# Patient Record
Sex: Female | Born: 1940
Health system: Southern US, Community
[De-identification: ages and names within clinical notes are randomized; demographics above are authoritative.]

## PROBLEM LIST (undated history)

## (undated) DIAGNOSIS — F329 Major depressive disorder, single episode, unspecified: Secondary | ICD-10-CM

## (undated) DIAGNOSIS — M25511 Pain in right shoulder: Secondary | ICD-10-CM

## (undated) DIAGNOSIS — Z9889 Other specified postprocedural states: Secondary | ICD-10-CM

## (undated) DIAGNOSIS — T8859XA Other complications of anesthesia, initial encounter: Secondary | ICD-10-CM

## (undated) DIAGNOSIS — K219 Gastro-esophageal reflux disease without esophagitis: Secondary | ICD-10-CM

## (undated) DIAGNOSIS — F32A Depression, unspecified: Secondary | ICD-10-CM

## (undated) DIAGNOSIS — I499 Cardiac arrhythmia, unspecified: Secondary | ICD-10-CM

## (undated) DIAGNOSIS — I2510 Atherosclerotic heart disease of native coronary artery without angina pectoris: Secondary | ICD-10-CM

## (undated) DIAGNOSIS — C801 Malignant (primary) neoplasm, unspecified: Secondary | ICD-10-CM

## (undated) DIAGNOSIS — I219 Acute myocardial infarction, unspecified: Secondary | ICD-10-CM

## (undated) DIAGNOSIS — R0609 Other forms of dyspnea: Secondary | ICD-10-CM

## (undated) DIAGNOSIS — I480 Paroxysmal atrial fibrillation: Secondary | ICD-10-CM

## (undated) DIAGNOSIS — M1990 Unspecified osteoarthritis, unspecified site: Secondary | ICD-10-CM

## (undated) DIAGNOSIS — T4145XA Adverse effect of unspecified anesthetic, initial encounter: Secondary | ICD-10-CM

## (undated) DIAGNOSIS — R112 Nausea with vomiting, unspecified: Secondary | ICD-10-CM

## (undated) DIAGNOSIS — I509 Heart failure, unspecified: Secondary | ICD-10-CM

## (undated) DIAGNOSIS — L03115 Cellulitis of right lower limb: Secondary | ICD-10-CM

## (undated) DIAGNOSIS — C569 Malignant neoplasm of unspecified ovary: Secondary | ICD-10-CM

## (undated) DIAGNOSIS — I639 Cerebral infarction, unspecified: Secondary | ICD-10-CM

## (undated) DIAGNOSIS — Z955 Presence of coronary angioplasty implant and graft: Secondary | ICD-10-CM

## (undated) DIAGNOSIS — K635 Polyp of colon: Secondary | ICD-10-CM

## (undated) DIAGNOSIS — N750 Cyst of Bartholin's gland: Secondary | ICD-10-CM

## (undated) DIAGNOSIS — R0600 Dyspnea, unspecified: Secondary | ICD-10-CM

## (undated) DIAGNOSIS — I10 Essential (primary) hypertension: Secondary | ICD-10-CM

## (undated) DIAGNOSIS — N8110 Cystocele, unspecified: Secondary | ICD-10-CM

## (undated) DIAGNOSIS — S32409A Unspecified fracture of unspecified acetabulum, initial encounter for closed fracture: Secondary | ICD-10-CM

## (undated) DIAGNOSIS — E119 Type 2 diabetes mellitus without complications: Secondary | ICD-10-CM

## (undated) DIAGNOSIS — F419 Anxiety disorder, unspecified: Secondary | ICD-10-CM

## (undated) DIAGNOSIS — E785 Hyperlipidemia, unspecified: Secondary | ICD-10-CM

## (undated) HISTORY — PX: OTHER SURGICAL HISTORY: SHX169

## (undated) HISTORY — PX: COLONOSCOPY: SHX174

## (undated) HISTORY — DX: Anxiety disorder, unspecified: F41.9

## (undated) HISTORY — DX: Cyst of Bartholin's gland: N75.0

## (undated) HISTORY — PX: FACIAL COSMETIC SURGERY: SHX629

## (undated) HISTORY — PX: SALIVARY GLAND SURGERY: SHX768

## (undated) HISTORY — DX: Hyperlipidemia, unspecified: E78.5

## (undated) HISTORY — DX: Other forms of dyspnea: R06.09

## (undated) HISTORY — DX: Unspecified fracture of unspecified acetabulum, initial encounter for closed fracture: S32.409A

## (undated) HISTORY — DX: Dyspnea, unspecified: R06.00

## (undated) HISTORY — DX: Major depressive disorder, single episode, unspecified: F32.9

## (undated) HISTORY — DX: Pain in right shoulder: M25.511

## (undated) HISTORY — DX: Malignant neoplasm of unspecified ovary: C56.9

## (undated) HISTORY — PX: CHOLECYSTECTOMY: SHX55

## (undated) HISTORY — PX: ABDOMINAL HYSTERECTOMY: SHX81

## (undated) HISTORY — PX: ROUX-EN-Y PROCEDURE: SUR1287

## (undated) HISTORY — DX: Cystocele, unspecified: N81.10

## (undated) HISTORY — DX: Depression, unspecified: F32.A

## (undated) HISTORY — DX: Gastro-esophageal reflux disease without esophagitis: K21.9

## (undated) HISTORY — PX: FRACTURE SURGERY: SHX138

## (undated) HISTORY — PX: KNEE SURGERY: SHX244

## (undated) HISTORY — DX: Essential (primary) hypertension: I10

## (undated) HISTORY — DX: Atherosclerotic heart disease of native coronary artery without angina pectoris: I25.10

## (undated) HISTORY — DX: Presence of coronary angioplasty implant and graft: Z95.5

## (undated) HISTORY — DX: Cellulitis of right lower limb: L03.115

## (undated) HISTORY — DX: Morbid (severe) obesity due to excess calories: E66.01

## (undated) HISTORY — DX: Type 2 diabetes mellitus without complications: E11.9

## (undated) HISTORY — PX: JOINT REPLACEMENT: SHX530

## (undated) HISTORY — DX: Paroxysmal atrial fibrillation: I48.0

---

## 1984-06-22 HISTORY — PX: OTHER SURGICAL HISTORY: SHX169

## 1987-06-23 HISTORY — PX: BREAST BIOPSY: SHX20

## 2000-04-29 ENCOUNTER — Encounter: Payer: Self-pay | Admitting: Family Medicine

## 2000-04-29 ENCOUNTER — Encounter: Admission: RE | Admit: 2000-04-29 | Discharge: 2000-04-29 | Payer: Self-pay | Admitting: Family Medicine

## 2000-06-01 ENCOUNTER — Ambulatory Visit (HOSPITAL_BASED_OUTPATIENT_CLINIC_OR_DEPARTMENT_OTHER): Admission: RE | Admit: 2000-06-01 | Discharge: 2000-06-01 | Payer: Self-pay | Admitting: Orthopaedic Surgery

## 2001-06-16 ENCOUNTER — Encounter: Payer: Self-pay | Admitting: Family Medicine

## 2001-06-16 ENCOUNTER — Encounter: Admission: RE | Admit: 2001-06-16 | Discharge: 2001-06-16 | Payer: Self-pay | Admitting: Family Medicine

## 2001-06-24 ENCOUNTER — Encounter: Admission: RE | Admit: 2001-06-24 | Discharge: 2001-06-24 | Payer: Self-pay | Admitting: Family Medicine

## 2001-06-24 ENCOUNTER — Encounter: Payer: Self-pay | Admitting: Family Medicine

## 2002-06-22 HISTORY — PX: TOTAL HIP ARTHROPLASTY: SHX124

## 2002-06-22 HISTORY — PX: OTHER SURGICAL HISTORY: SHX169

## 2002-06-30 ENCOUNTER — Encounter: Payer: Self-pay | Admitting: Emergency Medicine

## 2002-06-30 ENCOUNTER — Emergency Department (HOSPITAL_COMMUNITY): Admission: EM | Admit: 2002-06-30 | Discharge: 2002-06-30 | Payer: Self-pay

## 2002-07-14 ENCOUNTER — Inpatient Hospital Stay (HOSPITAL_COMMUNITY)
Admission: RE | Admit: 2002-07-14 | Discharge: 2002-07-24 | Payer: Self-pay | Admitting: Physical Medicine & Rehabilitation

## 2003-06-23 DIAGNOSIS — L03115 Cellulitis of right lower limb: Secondary | ICD-10-CM

## 2003-06-23 HISTORY — DX: Cellulitis of right lower limb: L03.115

## 2003-10-15 ENCOUNTER — Encounter: Admission: RE | Admit: 2003-10-15 | Discharge: 2003-10-15 | Payer: Self-pay | Admitting: Family Medicine

## 2003-11-21 ENCOUNTER — Encounter (INDEPENDENT_AMBULATORY_CARE_PROVIDER_SITE_OTHER): Payer: Self-pay | Admitting: Specialist

## 2003-11-21 ENCOUNTER — Ambulatory Visit (HOSPITAL_COMMUNITY): Admission: RE | Admit: 2003-11-21 | Discharge: 2003-11-21 | Payer: Self-pay | Admitting: *Deleted

## 2004-04-21 ENCOUNTER — Encounter (INDEPENDENT_AMBULATORY_CARE_PROVIDER_SITE_OTHER): Payer: Self-pay | Admitting: *Deleted

## 2004-04-21 ENCOUNTER — Ambulatory Visit (HOSPITAL_COMMUNITY): Admission: RE | Admit: 2004-04-21 | Discharge: 2004-04-21 | Payer: Self-pay | Admitting: Oral Surgery

## 2005-01-15 ENCOUNTER — Encounter (INDEPENDENT_AMBULATORY_CARE_PROVIDER_SITE_OTHER): Payer: Self-pay | Admitting: *Deleted

## 2005-01-15 ENCOUNTER — Ambulatory Visit (HOSPITAL_COMMUNITY): Admission: RE | Admit: 2005-01-15 | Discharge: 2005-01-15 | Payer: Self-pay | Admitting: *Deleted

## 2005-04-15 ENCOUNTER — Encounter: Admission: RE | Admit: 2005-04-15 | Discharge: 2005-04-15 | Payer: Self-pay | Admitting: Family Medicine

## 2006-06-22 HISTORY — PX: GASTRIC BYPASS: SHX52

## 2006-11-10 ENCOUNTER — Encounter: Admission: RE | Admit: 2006-11-10 | Discharge: 2006-11-10 | Payer: Self-pay | Admitting: Family Medicine

## 2008-03-16 ENCOUNTER — Encounter: Payer: Self-pay | Admitting: Obstetrics & Gynecology

## 2008-03-16 ENCOUNTER — Ambulatory Visit (HOSPITAL_COMMUNITY): Admission: RE | Admit: 2008-03-16 | Discharge: 2008-03-16 | Payer: Self-pay | Admitting: Obstetrics & Gynecology

## 2008-05-07 ENCOUNTER — Encounter: Admission: RE | Admit: 2008-05-07 | Discharge: 2008-05-07 | Payer: Self-pay | Admitting: Obstetrics & Gynecology

## 2008-05-29 ENCOUNTER — Encounter: Admission: RE | Admit: 2008-05-29 | Discharge: 2008-05-29 | Payer: Self-pay | Admitting: Obstetrics & Gynecology

## 2009-05-10 ENCOUNTER — Encounter: Admission: RE | Admit: 2009-05-10 | Discharge: 2009-05-10 | Payer: Self-pay | Admitting: Family Medicine

## 2010-07-12 ENCOUNTER — Encounter: Payer: Self-pay | Admitting: Cardiology

## 2010-08-11 ENCOUNTER — Other Ambulatory Visit: Payer: Self-pay | Admitting: Family Medicine

## 2010-08-11 DIAGNOSIS — Z1231 Encounter for screening mammogram for malignant neoplasm of breast: Secondary | ICD-10-CM

## 2010-09-01 ENCOUNTER — Ambulatory Visit
Admission: RE | Admit: 2010-09-01 | Discharge: 2010-09-01 | Disposition: A | Discharge status: Home / Self Care | Payer: Medicare Other | Source: Ambulatory Visit | Attending: *Deleted | Admitting: *Deleted

## 2010-09-01 DIAGNOSIS — Z1231 Encounter for screening mammogram for malignant neoplasm of breast: Secondary | ICD-10-CM

## 2010-11-04 NOTE — Op Note (Signed)
NAMEJAYDALYN, Margaret [name]                ACCOUNT NO.:  [id number]   MEDICAL RECORD NO.:  [id number]          PATIENT TYPE:  AMB   LOCATION:  SDC                           FACILITY:  [facility]   PHYSICIAN:  M. Leda Quail, MD  DATE OF BIRTH:  02-12-1941   DATE OF PROCEDURE:  03/16/2008  DATE OF DISCHARGE:                               OPERATIVE REPORT   PREOPERATIVE DIAGNOSES:  66. A 70 year old, gravida 1, para 1, married white female with vaginal      polyp.  2. Hypertension.  3. Gastroesophageal reflux disease.  4. History of obesity, status post laparoscopic Roux-en-Y in December      2008.   POSTOPERATIVE DIAGNOSES:  100. A 70 year old, gravida 1, para 1, married white female with vaginal      polyp.  2. Hypertension.  3. Gastroesophageal reflux disease.  4. History of obesity, status post laparoscopic Roux-en-Y in December      2008.   PROCEDURE:  Vaginal polyp excision and Pap smear.   SURGEON:  M. Leda Quail, MD   ASSISTANT:  OR staff.   ANESTHESIA:  General with an LMA.   FINDINGS:  A 3- to 4-cm firm vaginal polyp most consistent with a  fibroepithelial polyp.   SPECIMENS:  Polyp and Pap smear sent to pathology.   ESTIMATED BLOOD LOSS:  Minimal.   FLUIDS:  600 mL of LR.   URINE OUTPUT:  50 mL of clear urine.   COMPLICATIONS:  None.   INDICATIONS:  Margaret [name] is a 70 year old, G1, P71, married white female  who has a history of firm large vaginal polyp as noted on exam by Dr.  Benedetto Goad, her primary care doctor.  She was referred to me for  further management.  We discussed the possible etiologies of this polyp,  and most likely, this was a benign condition.  We discussed whether we  should leave it in place or have it excised.  She does note its presence  which is sometimes bothersome to her, and she decided to go ahead and  have it removed.  Risks and benefits were discussed with the patient in  the office.  Because of the size of it, I just felt likely should  do  this in the operating room to have cautery available and the ability to  do suturing if there was a lot of vascularity to this.  The patient was  in agreement.  She presents for this today.   OPERATIVE NOTE:  The patient was taken to the operating room.  She was  placed in supine position.  Anesthesia was administered by the  Anesthesia without difficulty.  Legs positioned in [place] stirrups in the  low lithotomy position and then lifted to the high lithotomy position.  Initially, a Pap smear was performed.  Then, using sterile technique,  the patient's perineum, inner thighs, and vagina were prepped and draped  in the normal sterile fashion.  A red rubber Foley catheter was used to  drain the bladder of all urine.  A bivalve speculum was placed in the  vagina.  This polyp was seen, but it was difficult to visualize with the  speculum, so the speculum was removed, and a heavyweight speculum was  placed in the posterior vaginal opening.  The patient was placed in a  little bit of Trendelenburg to help the heavyweight speculum place.  The  polyp was grasped with a pickup with teeth.  A 0 Vicryl suture was  placed at the base of the stock of the polyp.  Then, using a curved  Mayo, this polyp was excised.  A 1% lidocaine mixed 1:1 with epinephrine  (1:100,000 units) was instilled at the base.  Cautery was used to  cauterize across the base.  Then, a figure-of-eight suture of 2-0 Vicryl  was used to ensure hemostasis.  At this point, there was no bleeding  noted.  The procedure was ended, all instruments removed from the  vagina.   Sponge, lap, needle, and instrument counts were correct x2.  The  Betadine was cleansed off the patient's skin.  Her legs were positioned  back in supine position.  She was taken to the recovery room in stable  condition.      Lum Keas, MD  Electronically Signed     MSM/MEDQ  D:  03/16/2008  T:  03/16/2008  Job:  [id number]

## 2010-11-07 NOTE — Op Note (Signed)
[place]  Patient:    Margaret [name], Margaret [name]                         MRN: [id number] Proc. Date: 06/01/00 Adm. Date:  [id number] Attending:  Marcene Corning                           Operative Report  PREOPERATIVE DIAGNOSES: 1. Right knee chondromalacia of the patella. 2. Right knee torn lateral meniscus.  POSTOPERATIVE DIAGNOSES: 1. Right knee chondromalacia of the patella. 2. Right knee torn lateral meniscus. 3. Right knee loose body.  OPERATION: 1. Right knee partial lateral meniscectomy. 2. Right knee chondroplasty patella and lateral tibial plateau. 3. Right knee removal loose body.  SURGEON:  Lubertha Basque. Jerl Santos, M.D.  ASSISTANT:  Prince Rome, P.A.  ANESTHESIA:  General  INDICATION FOR PROCEDURE:  The patient is a 70 year old woman with about a year of right knee pain.  This has persisted with slight activity restriction and the use of several different anti-inflammatories.  At this point she is offered operative intervention to consist of an arthroscopy.  The procedure was discussed with the patient and informed operative consent was obtained after discussion of the possible complications and reaction to anesthesia and infection.  DESCRIPTION OF PROCEDURE:  The patient was taken to the operating suite where general anesthetic was applied without difficulty.  She was positioned supine and prepped and draped in a normal sterile fashion.  After the administration of preoperative IV antibiotics, an arthroscopy of the right knee was performed through a total of two portals.  Suprapatellar pouch was benign while the patellar femoral joint exhibited some grade III chondromalacia of both aspects.  This was addressed with a thorough chondroplasty.  The joint tracks fairly well and no lateral release was felt to be necessary.  The medial compartment was benign with no evidence of meniscal articular cartilage injury.  The ACL and PCL were  intact in the notch. In the lateral compartment she had a 1 cm loose body which was a large flap of articular cartilage.  This was removed.  This appeared to emanate from the tibial plateau and the lateral compartment.  A thorough chondroplasty was performed here, though she was left with dime sized area of grade IV change.  She also had a degenerative tear of the middle and posterior horns of the lateral meniscus.  This was addressed with a partial lateral meniscectomy taking about 5% of the structure back to the stable rim.  The knee joint was thoroughly irrigated at the end of the case followed by placement of Marcaine with epinephrine and morphine plus Depo-Medrol.  Adaptic was placed at portals followed by dry gauze and a loose Ace wrap.  Estimated blood loss and intraoperative fluids can be obtained from anesthesia records.  DISPOSITION:  The patient was extubated in the operating room and taken to the recovery room in stable condition.  Plans were for her to go home the same day and follow up in the office in less than a week. I will contact her by phone tonight. DD:  06/01/00 TD:  06/01/00 Job: [id number]

## 2010-11-07 NOTE — Op Note (Signed)
Margaret [name], Margaret [name]                ACCOUNT NO.:  [id number]   MEDICAL RECORD NO.:  [id number]          PATIENT TYPE:  AMB   LOCATION:  ENDO                         FACILITY:  [facility]   PHYSICIAN:  Georgiana Spinner, M.D.    DATE OF BIRTH:  1941/05/16   DATE OF PROCEDURE:  01/15/2005  DATE OF DISCHARGE:                                 OPERATIVE REPORT   PROCEDURE:  Colonoscopy with biopsy and polypectomy.   ENDOSCOPIST:  Georgiana Spinner, M.D.   INDICATIONS:  Colon polyp.   ANESTHESIA:  Demerol 70 mg, Versed 7.5 mg.   PROCEDURE IN DETAIL:  With the patient mildly sedated in the left lateral  decubitus position and subsequently rolled to her back with pressure applied  to the abdomen, the Olympus videoscopic colonoscope was inserted in the  rectum and passed under direct vision to the cecum identified by ileocecal  valve and appendiceal orifice both of which were photographed from this  point. The colonoscope was slowly withdrawn taking circumferential views of  the colonic mucosa stopping at 25 cm approximately from the anal verge at  which point a small sessile polyp was seen, photographed, and removed first  using snare cautery technique. There was a small amount of residual polyp  left after this, so I used a hot biopsy forceps to eradicate the remainder  of this to my satisfaction. I took photograph to document this and the  endoscope was then withdrawn to the rectum which appeared normal and  directly showed hemorrhoids on retroflexed view. The endoscope was  straightened and withdrawn. The patient's vital signs and pulse oximeter  remained stable. The patient tolerated procedure well without apparent  complication.   FINDINGS:  1.  Small polyp at 25 cm from the anal verge removed. 2. Scattered      diverticulosis throughout the colon, mild. 3. Internal hemorrhoids.   PLAN:  Await biopsy report. The patient will call me for results and follow-  up with me as an  outpatient       GMO/MEDQ  D:  01/15/2005  T:  01/15/2005  Job:  [id number]

## 2010-11-07 NOTE — Op Note (Signed)
NAME:  [name], Margaret [name]                          ACCOUNT NO.:  [id number]   MEDICAL RECORD NO.:  [id number]                   PATIENT TYPE:  AMB   LOCATION:  ENDO                                 [facility]ACILITY:  [facility]   PHYSICIAN:  Georgiana Spinner, M.D.                 DATE OF BIRTH:  Dec 16, 1940   DATE OF PROCEDURE:  11/21/2003  DATE OF DISCHARGE:                                 OPERATIVE REPORT   PROCEDURE:  Colon polyps.   ENDOSCOPIST:  Georgiana Spinner, M.D.   ANESTHESIA:  Demerol 90, Versed 8 mg, Phenergan 12.5 mg.   DESCRIPTION OF PROCEDURE:  With the patient mildly sedated in the left  lateral decubitus position, the Olympus videoscopic colonoscope was inserted  in the rectum and passed under direct vision to the cecum identified by  ileocecal valve and appendiceal orifice, both of which were photographed.  From this point the colonoscope was slowly withdrawn, taking circumferential  views of the colonic mucosa, stopping first at 40 cm from the anal verge at  which point a thickening of a fold was seen that appeared it may have been  adenomatous-type tissue, it was photographed and it was removed using hot  biopsy forceps technique a setting 20/25 current. We next stopped at 20 cm  from the anal verge at which point a larger polyp approximately 7 mm in  size, raised and round was noted, photographed and it was removed using  snare cautery technique setting of 20/25 current, and tissue was suctioned  into the scope and retrieved for pathology. The endoscope was then withdrawn  all the way to the rectum which appeared normal on direct view and on  retroflexed view of the rectum internal hemorrhoids were noted along with  another smaller polyp, flat, sessile just a few centimeters above the  dentate line. This was photographed as well and it too was removed using hot  biopsy forceps technique, again a setting of 20/25 current. The endoscope  was straightened and withdrawn. The patient's vital  signs and pulse oximeter  remained stable. The patient tolerated the procedure well with no apparent  complication.   FINDINGS:  Polyps as described above at 40, 20 and just a few centimeters  from the anal verge.   PLAN:  Await biopsy reports. The patient will call me for the results and  follow up with me as an outpatient.                                               Georgiana Spinner, M.D.    GMO/MEDQ  D:  11/21/2003  T:  11/21/2003  Job:  [id number]   cc:   Teena Irani. Arlyce Dice, M.D.  [address]  [place]  [place] [address]  Fax: [phone]
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
## 2010-11-07 NOTE — Op Note (Signed)
NAMEMARLEENA, [name] NO.:  [id number]   MEDICAL RECORD NO.:  [id number]          PATIENT TYPE:  OIB   LOCATION:  2899                         FACILITY:  [facility]   PHYSICIAN:  Dora Sims, M.D.  DATE OF BIRTH:  18-Oct-1940   DATE OF PROCEDURE:  04/21/2004  DATE OF DISCHARGE:  04/21/2004                                 OPERATIVE REPORT   PREOPERATIVE DIAGNOSIS:  Minor salivary gland mucoepidermoid carcinoma,  right-sided buccal mucosa.   POSTOPERATIVE DIAGNOSIS:  Minor salivary gland mucoepidermoid carcinoma,  right-sided buccal mucosa.   OPERATION PERFORMED:  Excision of biopsy margins not clear of carcinoma.   SURGEON:  Dora Sims, D.D.S., M.D.   ANESTHESIA:  Managed anesthesia care.   INDICATIONS FOR PROCEDURE:  The patient is a 70 year old Caucasian woman who  was initially referred to my office for evaluation of a right buccal mucosal  nodule.  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  The patient was then sent for a CT scan to rule out any  pathology of the parotid gland which was found to be negative on the right  side.  There was a small superficial lobe nodule that was found on the left  but not related to the biopsy of the right buccal mucosa.  It was discussed  with the patient that we needed to remove any remaining cancer cells of the  right buccal mucosa that were not excised at the time of biopsy.  Appropriate consents were obtained and the patient was scheduled for removal  of residual tumor.   DESCRIPTION OF PROCEDURE:  The patient was maintained n.p.o. the night  before surgery, brought to the operating suite, placed in supine position.  All anesthesia monitors were found to be working appropriately.  The patient  was administered managed anesthesia care.  She  was not intubated and was  kept comfortable during the case.  The patient was draped in a clean  fashion.  Approximately 4 mL of 2% lidocaine with 1:100,000 parts  epinephrine was injected into the right buccal mucosa in the area of the  previous biopsy.  Under loupe magnification, the previous surgical was  easily identified and clean margins of buccal mucosa were excised medially  approximately to the area of the attached gingiva and laterally  approximately 4 to 5 mm outside of the previous incision site.  A fusiform  incision was made around the previous incision.  Dissection was made sharply  down through buccal mucosa, through buccinator muscle and most inferiorly, a  plane was dissected above the periosteum.  Once the lesion was completely  excised, it was sent for frozen section from the operating room and I waited  hear the results which were confirmed as negative for any cancer cells per  frozen section.  Once this was determined, a minor rotation advancement flap  was performed in order to aid in the closure of the wound  of the buccal  defect.  This was closed in a tension free manner using 4-0 Vicryl sutures  in an interrupted and running fashion.  The patient tolerated the procedure  well.  Minimal blood was lost.  No blood was administered.  No drains were  placed.  The specimen that was removed from right buccal mucosa was sent for  frozen section and will be sent for definitive histopathologic evaluation to  rule out any margins that are not free of tumor.  At my request will also be  sent to Dr.  Vassie Loll at [facility] Oral Pathology Department as she was  the first pathologist to evaluate the case.  The patient will be maintained  on a soft chewing diet, some p.o. antibiotics and pain medicines will be  followed in my clinic until complete healing of the resection of her tumor.      [place]obe   RJR/MEDQ  D:  05/06/2004  T:  05/06/2004  Job:  [id number]

## 2011-03-23 LAB — BASIC METABOLIC PANEL
Calcium: 9.2
Chloride: 104
GFR calc Af Amer: >60
GFR calc non Af Amer: >60
Potassium: 2.9 — ABNORMAL LOW
Sodium: 141

## 2011-03-23 LAB — URINE MICROSCOPIC-ADD ON

## 2011-03-23 LAB — URINALYSIS, ROUTINE W REFLEX MICROSCOPIC
Glucose, UA: NEGATIVE
Specific Gravity, Urine: 1.025
Urobilinogen, UA: 1
pH: 5.5

## 2011-03-23 LAB — CBC
HCT: 39
MCHC: 33.5
Platelets: 209

## 2011-09-23 ENCOUNTER — Other Ambulatory Visit: Payer: Self-pay | Admitting: Family Medicine

## 2011-09-23 DIAGNOSIS — Z1231 Encounter for screening mammogram for malignant neoplasm of breast: Secondary | ICD-10-CM

## 2011-10-07 ENCOUNTER — Ambulatory Visit
Admission: RE | Admit: 2011-10-07 | Discharge: 2011-10-07 | Disposition: A | Discharge status: Home / Self Care | Payer: Medicare Other | Source: Ambulatory Visit | Attending: Family Medicine | Admitting: Family Medicine

## 2011-10-07 DIAGNOSIS — Z1231 Encounter for screening mammogram for malignant neoplasm of breast: Secondary | ICD-10-CM

## 2011-10-16 ENCOUNTER — Encounter: Payer: Self-pay | Admitting: *Deleted

## 2011-10-16 DIAGNOSIS — F419 Anxiety disorder, unspecified: Secondary | ICD-10-CM | POA: Insufficient documentation

## 2011-10-16 DIAGNOSIS — L03115 Cellulitis of right lower limb: Secondary | ICD-10-CM | POA: Insufficient documentation

## 2011-10-16 DIAGNOSIS — M25511 Pain in right shoulder: Secondary | ICD-10-CM | POA: Insufficient documentation

## 2011-10-16 DIAGNOSIS — F329 Major depressive disorder, single episode, unspecified: Secondary | ICD-10-CM | POA: Insufficient documentation

## 2011-10-16 DIAGNOSIS — F32A Depression, unspecified: Secondary | ICD-10-CM | POA: Insufficient documentation

## 2011-11-19 ENCOUNTER — Ambulatory Visit: Payer: Medicare Other | Admitting: Cardiology

## 2012-02-08 ENCOUNTER — Ambulatory Visit: Payer: Self-pay | Admitting: Internal Medicine

## 2012-02-08 DIAGNOSIS — Z955 Presence of coronary angioplasty implant and graft: Secondary | ICD-10-CM

## 2012-02-08 HISTORY — DX: Presence of coronary angioplasty implant and graft: Z95.5

## 2012-02-09 LAB — CK TOTAL AND CKMB (NOT AT ARMC): CK-MB: 0.9 ng/mL (ref 0.5–3.6)

## 2012-02-09 LAB — BASIC METABOLIC PANEL
Anion Gap: 7 (ref 7–16)
Creatinine: 0.76 mg/dL (ref 0.60–1.30)
EGFR (Non-African Amer.): >60
Glucose: 95 mg/dL (ref 65–99)
Potassium: 3.1 mmol/L — ABNORMAL LOW (ref 3.5–5.1)
Sodium: 145 mmol/L (ref 136–145)

## 2012-05-10 ENCOUNTER — Ambulatory Visit: Payer: Medicare Other | Admitting: Cardiology

## 2012-05-11 ENCOUNTER — Ambulatory Visit: Payer: Medicare Other | Admitting: Cardiology

## 2012-05-20 ENCOUNTER — Ambulatory Visit (INDEPENDENT_AMBULATORY_CARE_PROVIDER_SITE_OTHER): Payer: Medicare Other | Admitting: Cardiology

## 2012-05-20 ENCOUNTER — Ambulatory Visit
Admission: RE | Admit: 2012-05-20 | Discharge: 2012-05-20 | Disposition: A | Discharge status: Home / Self Care | Payer: Medicare Other | Source: Ambulatory Visit | Attending: Cardiology | Admitting: Cardiology

## 2012-05-20 ENCOUNTER — Encounter: Payer: Self-pay | Admitting: Cardiology

## 2012-05-20 VITALS — BP 210/100 | HR 59 | Wt 189.6 lb

## 2012-05-20 DIAGNOSIS — I2510 Atherosclerotic heart disease of native coronary artery without angina pectoris: Secondary | ICD-10-CM

## 2012-05-20 DIAGNOSIS — E785 Hyperlipidemia, unspecified: Secondary | ICD-10-CM

## 2012-05-20 DIAGNOSIS — I10 Essential (primary) hypertension: Secondary | ICD-10-CM

## 2012-05-20 DIAGNOSIS — R0989 Other specified symptoms and signs involving the circulatory and respiratory systems: Secondary | ICD-10-CM

## 2012-05-20 MED ORDER — ASPIRIN EC 81 MG PO TBEC: 81.0000 mg | DELAYED_RELEASE_TABLET | Freq: Every day | ORAL | Status: DC

## 2012-05-20 NOTE — Progress Notes (Signed)
Margaret [name] Date of Birth: 04/03/1941 Medical Record #[id number]  History of Present Illness: Margaret [name] is seen today for a second opinion concerning symptoms of dyspnea. I had seen Margaret [name] in the past. She was seen in 2006 and underwent an adenosine Cardiolite study as preoperative evaluation for gastric bypass. That study demonstrated a mild fixed anterior wall attenuation without ischemia. Ejection fraction was normal at 62%. In June 2011 she underwent a stress echo. She was able to walk 7 minutes on the Bruce protocol and was limited by dyspnea. She had no significant ST changes. Her echo images were normal. She was noted to have occasional PVCs. She has since been followed at [facility]. She reports that this spring she began experiencing increased symptoms of dyspnea on exertion particularly going up and down steps. She was still able to play tennis without significant limitation. She did have some associated tightness in her mid sternum that was relieved with rest. She underwent a stress echo which apparently was mildly abnormal. This resulted in cardiac catheterization. This was performed on 02/08/2012. Demonstrated diffuse nonobstructive disease in the left coronary system. In the right coronary there was a 70% stenosis in the mid vessel and an 80% stenosis distally. The distal lesion was stented with a 2.5 x 18 mm Xience stent. Apparently there was flow wire analysis of the 70% stenosis in the mid vessel and this was not stented. After the procedure she noticed no improvement in her symptoms of dyspnea. She apparently had a followup stress test for which she was able to do better but those results are unknown. Her symptoms however continued. She denies any cough or fever. She's had no signs or symptoms of upper respiratory illness. She's had no prolonged travel. She denies any pleuritic chest pain. Her weight has been stable. She does have some chronic lower extremity edema. She admits that  recently she hasn't been regular about taking her blood pressure medication.  Current Outpatient Prescriptions on File Prior to Visit  Medication Sig Dispense Refill  . amLODipine (NORVASC) 5 MG tablet Take 5 mg by mouth daily.       Marland Kitchen atorvastatin (LIPITOR) 10 MG tablet Take 10 mg by mouth daily.       Tery Sanfilippo Calcium (STOOL SOFTENER PO) Take by mouth. As needed      . hydrochlorothiazide (HYDRODIURIL) 25 MG tablet Take 25 mg by mouth daily.      Marland Kitchen KLOR-CON 10 10 MEQ tablet       . OMEPRAZOLE PO Take by mouth daily.      . potassium chloride (K-DUR,KLOR-CON) 10 MEQ tablet Take 10 mEq by mouth daily.        Allergies  Allergen Reactions  . Other     Micropore tape    Past Medical History  Diagnosis Date  . Hypertension   . Hyperlipidemia   . Morbid obesity   . Right shoulder pain   . Anxiety and depression   . Dyspnea on exertion   . Cellulitis of right leg 2005  . CAD (coronary artery disease)   . Presence of stent in right coronary artery 02/08/12    2.5x18 Xience distal RCA    Past Surgical History  Procedure Date  . Gastric bypass 2008  . Other surgical history 1986    hysterectomy  . Right knee late 90's    arthroscopic  . Knee surgery     left knee arthroscopic  . Total hip arthroplasty 2004  left  . Cholecystectomy   . Salivary gland surgery   . Breast biopsy 1989    History  Smoking status  . Former Smoker  . Types: Cigarettes  . Quit date: 06/22/1984  Smokeless tobacco  . Not on file    History  Alcohol Use     Family History  Problem Relation Age of Onset  . Heart attack Father   . Heart attack Sister 1    cabg  . Heart attack Brother 56    cabg    Review of Systems: The review of systems is positive for history of morbid obesity. She is status post gastric bypass surgery and lost a significant amount of weight. She is a retired Medical laboratory scientific officer. All other systems were reviewed and are negative.  Physical Exam: BP 210/100   Pulse 59  Wt 189 lb 9.6 oz (86.002 kg) She is a pleasant, obese white female in no acute distress. HEENT: Normocephalic, atraumatic. Pupils are equal round and reactive to light accommodation. Extraocular movements are full. Oropharynx is clear. Neck is supple without jugular venous distention, adenopathy, thyromegaly, or bruits. Lungs: Clear to auscultation and percussion. Cardiovascular: Regular rate and rhythm, normal S1 and S2. There is a grade 1-2/6 systolic ejection murmur the right upper sternal border without radiation. Abdomen: Obese, soft, and nontender. No masses or bruits. No hepatosplenomegaly. Extremities: Her legs are obese with 1+ bilateral edema. Pedal pulses are palpable. No cyanosis. No phlebitis. Skin: Warm and dry. Neuro: Alert and oriented x3. Cranial nerves II through XII are intact. No focal findings. Mood is appropriate.  LABORATORY DATA: ECG demonstrates sinus bradycardia with a rate of 59 beats per minute. She has LVH by voltage. There is poor progression V1 through V4.  Assessment / Plan: 1. Dyspnea. Etiology is unclear. The fact that she did not improve with stenting of her distal RCA suggests that her symptoms are probably not ischemic. We will obtain a chest x-ray today as apparently this has not been done. I requested all her records from [place] including any lab work, stress test results, cardiac cath results and films. I will followup again in 3 weeks to review of his data and to decide what the next appropriate step would be.  2. Coronary disease status post stenting of the distal RCA with a drug-eluting stent. Recommend dual antiplatelet therapy for one year.  3. Hypertension, poorly controlled on exam today. Patient has not been compliant with her medications. Resume her amlodipine and HCTZ and repeat evaluation in 3 weeks.  4. Morbid obesity. Status post gastric bypass. With her symptoms of dyspnea and we may need to consider a sleep evaluation to rule out  obstructive sleep apnea. Also need to consider possible pulmonary hypertension.  5. Hyperlipidemia  6. GERD.

## 2012-05-20 NOTE — Patient Instructions (Signed)
We will schedule you for a chest Xray.  We will get your records from [place] to review.  I will see you back in about 3 weeks.

## 2012-06-13 ENCOUNTER — Encounter: Payer: Self-pay | Admitting: Cardiology

## 2012-06-13 ENCOUNTER — Ambulatory Visit (INDEPENDENT_AMBULATORY_CARE_PROVIDER_SITE_OTHER): Payer: Medicare Other | Admitting: Cardiology

## 2012-06-13 ENCOUNTER — Telehealth: Payer: Self-pay

## 2012-06-13 VITALS — BP 145/78 | HR 63 | Ht 68.0 in | Wt 187.4 lb

## 2012-06-13 DIAGNOSIS — R0609 Other forms of dyspnea: Secondary | ICD-10-CM

## 2012-06-13 DIAGNOSIS — I10 Essential (primary) hypertension: Secondary | ICD-10-CM

## 2012-06-13 DIAGNOSIS — I519 Heart disease, unspecified: Secondary | ICD-10-CM

## 2012-06-13 DIAGNOSIS — I5189 Other ill-defined heart diseases: Secondary | ICD-10-CM | POA: Insufficient documentation

## 2012-06-13 DIAGNOSIS — R0989 Other specified symptoms and signs involving the circulatory and respiratory systems: Secondary | ICD-10-CM

## 2012-06-13 DIAGNOSIS — I2510 Atherosclerotic heart disease of native coronary artery without angina pectoris: Secondary | ICD-10-CM

## 2012-06-13 NOTE — Telephone Encounter (Signed)
[facility] called spoke to Shanda Bumps was told Dr.Jordan needs a copy of recent cardiac cath films.Stated she would get a copy in the mail.

## 2012-06-13 NOTE — Patient Instructions (Addendum)
Get started with your exercise program- I encourage you to do Cardiac Rehab.  Continue your blood pressure medications  Avoid salt.  I will see you again in 4 months.

## 2012-06-14 NOTE — Progress Notes (Addendum)
[name] Date of Birth: 22-Nov-1940 Medical Record #[id number]  History of Present Illness: Margaret [name] is seen for followup today. She was seen this past month for evaluation of dyspnea. She had had previous stress test by myself in 2006 and in 2011. She has since been followed at [facility]. She began experiencing symptoms of dyspnea this past summer and had a nuclear stress test performed in June which revealed a small reversible defect in the anterior and apical wall segments. This led to a cardiac catheterization which demonstrated moderate tandem lesions in the right coronary with a 70% stenosis in the mid vessel and an 80% stenosis distally. The distal lesion was treated with a 2.5 x 18 mm Xience stent. The mid vessel lesion was not treated apparently based on the results of a flow wire analysis. This has made no change in her dyspnea. She did have a followup nuclear stress test on 03/23/2012 which was normal. On her last visit she had elevated blood pressure and had been inconsistent with taking her medications. She has been taking her medications regularly now and reports that her blood pressure is doing much better.  Current Outpatient Prescriptions on File Prior to Visit  Medication Sig Dispense Refill  . amLODipine (NORVASC) 5 MG tablet Take 5 mg by mouth daily.       Marland Kitchen aspirin EC 81 MG tablet Take 1 tablet (81 mg total) by mouth daily.  90 tablet  3  . atorvastatin (LIPITOR) 10 MG tablet Take 10 mg by mouth daily.       . clopidogrel (PLAVIX) 75 MG tablet       . Docusate Calcium (STOOL SOFTENER PO) Take by mouth. As needed      . hydrochlorothiazide (HYDRODIURIL) 25 MG tablet Take 25 mg by mouth daily.      Marland Kitchen KLOR-CON 10 10 MEQ tablet       . OMEPRAZOLE PO Take by mouth daily.      . potassium chloride (K-DUR,KLOR-CON) 10 MEQ tablet Take 10 mEq by mouth daily.        Allergies  Allergen Reactions  . Other     Micropore tape    Past Medical History  Diagnosis Date  .  Hypertension   . Hyperlipidemia   . Morbid obesity   . Right shoulder pain   . Anxiety and depression   . Dyspnea on exertion   . Cellulitis of right leg 2005  . CAD (coronary artery disease)   . Presence of stent in right coronary artery 02/08/12    2.5x18 Xience distal RCA    Past Surgical History  Procedure Date  . Gastric bypass 2008  . Other surgical history 1986    hysterectomy  . Right knee late 90's    arthroscopic  . Knee surgery     left knee arthroscopic  . Total hip arthroplasty 2004    left  . Cholecystectomy   . Salivary gland surgery   . Breast biopsy 1989    History  Smoking status  . Former Smoker  . Types: Cigarettes  . Quit date: 06/22/1984  Smokeless tobacco  . Not on file    History  Alcohol Use     Family History  Problem Relation Age of Onset  . Heart attack Father   . Heart attack Sister 33    cabg  . Heart attack Brother 69    cabg    Review of Systems: The review of systems is positive for  history of morbid obesity. She is status post gastric bypass surgery and lost a significant amount of weight. She is a retired Medical laboratory scientific officer. All other systems were reviewed and are negative.  Physical Exam: BP 145/78  Pulse 63  Ht 5\' 8"  (1.727 m)  Wt 187 lb 6.4 oz (85.004 kg)  BMI 28.49 kg/m2 She is a pleasant, obese white female in no acute distress. HEENT: Normal. Neck is supple without jugular venous distention, adenopathy, thyromegaly, or bruits. Lungs: Clear to auscultation and percussion. Cardiovascular: Regular rate and rhythm, normal S1 and S2. There is a grade 1/6 systolic ejection murmur the right upper sternal border without radiation. Abdomen: Obese, soft, and nontender. No masses or bruits. No hepatosplenomegaly. Extremities: Her legs are obese with 1+ bilateral edema. Pedal pulses are palpable. No cyanosis. No phlebitis. Skin: Warm and dry. Neuro: Alert and oriented x3. Cranial nerves II through XII are intact. No focal  findings. Mood is appropriate.  LABORATORY DATA: RADIOLOGY REPORT*  Clinical Data: Shortness of breath, ex-smoker, CAD, hypertension  CHEST - 2 VIEW  Comparison: 04/17/2004  Findings:  Grossly unchanged enlarged cardiac silhouette and mediastinal contours with mild tortuosity of the thoracic aorta. The lungs remain hyperexpanded with flattening of the bilateral hemidiaphragms. No focal parenchymal opacities. No pleural effusion or pneumothorax. Unchanged bones.  IMPRESSION: Hyperexpanded lungs without acute cardiopulmonary disease.   Original Report Authenticated By: Tacey Ruiz, MD        Last Resulted: 05/20/12 1:24 PM       Assessment / Plan: 1. Dyspnea. Etiology is multifactorial. The fact that she did not improve with stenting of her distal RCA suggests that her symptoms are probably not ischemic. In reviewing her cardiac catheterization data she had an elevated left ventricular EDP of 18 mmHg. Her echocardiogram showed evidence of diastolic dysfunction. I think that her dyspnea is predominantly related to deconditioning and some diastolic dysfunction. I stressed the importance of good blood pressure control. I recommended a long-term exercise program to improve her conditioning. I think it would be great for her to enroll in cardiac rehabilitation and she has been contacted by the rehabilitation program at [facility]. I will followup again in 4 months.  2. Coronary disease status post stenting of the distal RCA with a drug-eluting stent. Recommend dual antiplatelet therapy for one year. I will still try to obtain a copy of her cath films.  3. Hypertension, blood pressure control has improved significantly. Continue sodium restriction and her current medications.  4. Morbid obesity. Status post gastric bypass. With her symptoms of dyspnea and we may need to consider a sleep evaluation to rule out obstructive sleep apnea. Prior echo showed no evidence of pulmonary  hypertension.  5. Hyperlipidemia  6. GERD.  Addendum: I personally reviewed the cardiac catheterization films from 02/08/2012. Left ventricular function was normal. LAD was moderately calcified with 20-30% disease proximally. There was diffuse narrowing of the mid LAD to 30-40%. The first diagonal had an 80% stenosis in the mid vessel. The left circumflex also moderately calcified. There was a 30% stenosis proximally at the ostium. There was an eccentric 50% stenosis in the mid vessel prior to 2 obtuse marginal vessels. The right coronary demonstrated diffuse 50-70% disease in the mid vessel with a focal 70% stenosis in the distal vessel. The distal lesion was successfully stented. Peter Swaziland MD, [facility]

## 2012-06-27 ENCOUNTER — Encounter: Payer: Self-pay | Admitting: Cardiology

## 2012-07-11 ENCOUNTER — Encounter: Payer: Self-pay | Admitting: Internal Medicine

## 2012-07-23 ENCOUNTER — Encounter: Payer: Self-pay | Admitting: Internal Medicine

## 2012-08-20 ENCOUNTER — Encounter: Payer: Self-pay | Admitting: Internal Medicine

## 2012-09-20 ENCOUNTER — Encounter: Payer: Self-pay | Admitting: Internal Medicine

## 2012-10-17 ENCOUNTER — Encounter: Payer: Self-pay | Admitting: Obstetrics & Gynecology

## 2012-10-18 ENCOUNTER — Telehealth: Payer: Self-pay | Admitting: Obstetrics & Gynecology

## 2012-10-18 ENCOUNTER — Ambulatory Visit: Payer: Medicare Other | Admitting: Obstetrics & Gynecology

## 2012-10-18 NOTE — Telephone Encounter (Signed)
Pt. Canceled appointment due to Medicare fee [phone]. Pt did not reschedule she has been put in AEX recall.

## 2012-10-18 NOTE — Telephone Encounter (Signed)
I don't think she needs to go into recall since she came for and cancelled this appt.

## 2012-10-20 ENCOUNTER — Encounter: Payer: Self-pay | Admitting: Internal Medicine

## 2012-10-21 ENCOUNTER — Other Ambulatory Visit: Payer: Self-pay

## 2012-10-21 DIAGNOSIS — Z1231 Encounter for screening mammogram for malignant neoplasm of breast: Secondary | ICD-10-CM

## 2012-11-20 ENCOUNTER — Encounter: Payer: Self-pay | Admitting: Internal Medicine

## 2012-12-05 ENCOUNTER — Ambulatory Visit: Payer: Medicare Other

## 2012-12-20 ENCOUNTER — Encounter: Payer: Self-pay | Admitting: Internal Medicine

## 2012-12-30 ENCOUNTER — Ambulatory Visit (INDEPENDENT_AMBULATORY_CARE_PROVIDER_SITE_OTHER): Payer: Medicare Other | Admitting: Cardiology

## 2012-12-30 ENCOUNTER — Encounter: Payer: Self-pay | Admitting: Cardiology

## 2012-12-30 DIAGNOSIS — I10 Essential (primary) hypertension: Secondary | ICD-10-CM

## 2012-12-30 DIAGNOSIS — R0609 Other forms of dyspnea: Secondary | ICD-10-CM

## 2012-12-30 DIAGNOSIS — R0989 Other specified symptoms and signs involving the circulatory and respiratory systems: Secondary | ICD-10-CM

## 2012-12-30 DIAGNOSIS — I2510 Atherosclerotic heart disease of native coronary artery without angina pectoris: Secondary | ICD-10-CM

## 2012-12-30 NOTE — Patient Instructions (Signed)
Monitor your blood pressure at home. If it stays over 140/90 we will need to add some additional medication.  I will see you in 6 months.
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
## 2013-01-20 ENCOUNTER — Encounter: Payer: Self-pay | Admitting: Internal Medicine

## 2013-03-22 ENCOUNTER — Telehealth: Payer: Self-pay | Admitting: Cardiology

## 2013-03-22 NOTE — Telephone Encounter (Signed)
Walk In Pt Form " Due For Knee Surgery, Has Questions" gave to Margaret [name] 03/22/13/KM

## 2013-03-31 ENCOUNTER — Telehealth: Payer: Self-pay

## 2013-03-31 NOTE — Telephone Encounter (Signed)
Patient called 03/27/13 received walk in form about wanting to know if ok to hold plavix for up coming knee surgery.Spoke to Dr.Jordan he advised ok to stop plavix permanently.

## 2013-04-07 ENCOUNTER — Inpatient Hospital Stay (HOSPITAL_COMMUNITY): Admission: RE | Admit: 2013-04-07 | Payer: Medicare Other | Source: Ambulatory Visit

## 2013-04-12 ENCOUNTER — Other Ambulatory Visit: Payer: Self-pay | Admitting: Orthopaedic Surgery

## 2013-04-25 ENCOUNTER — Inpatient Hospital Stay (HOSPITAL_COMMUNITY): Admission: RE | Admit: 2013-04-25 | Payer: Medicare Other | Source: Ambulatory Visit

## 2013-05-04 DIAGNOSIS — M1710 Unilateral primary osteoarthritis, unspecified knee: Secondary | ICD-10-CM | POA: Insufficient documentation

## 2013-05-17 ENCOUNTER — Inpatient Hospital Stay (HOSPITAL_COMMUNITY): Admission: RE | Admit: 2013-05-17 | Payer: Medicare Other | Source: Ambulatory Visit

## 2013-05-30 ENCOUNTER — Encounter (HOSPITAL_COMMUNITY): Admission: RE | Payer: Self-pay | Source: Ambulatory Visit

## 2013-05-30 ENCOUNTER — Inpatient Hospital Stay (HOSPITAL_COMMUNITY): Admission: RE | Admit: 2013-05-30 | Payer: Medicare Other | Source: Ambulatory Visit | Admitting: Orthopaedic Surgery

## 2013-05-30 SURGERY — ARTHROPLASTY, KNEE, TOTAL
Anesthesia: Spinal | Laterality: Left

## 2014-02-22 ENCOUNTER — Ambulatory Visit: Payer: Self-pay | Admitting: Otolaryngology

## 2014-04-23 ENCOUNTER — Encounter: Payer: Self-pay | Admitting: Cardiology

## 2014-05-28 ENCOUNTER — Encounter: Payer: Self-pay | Admitting: Podiatry

## 2014-05-28 ENCOUNTER — Other Ambulatory Visit: Payer: Self-pay | Admitting: *Deleted

## 2014-05-28 ENCOUNTER — Ambulatory Visit (INDEPENDENT_AMBULATORY_CARE_PROVIDER_SITE_OTHER): Payer: Medicare Other | Admitting: Podiatry

## 2014-05-28 VITALS — BP 147/70 | HR 65 | Resp 16 | Ht 69.0 in | Wt 190.0 lb

## 2014-05-28 DIAGNOSIS — L600 Ingrowing nail: Secondary | ICD-10-CM

## 2014-05-28 NOTE — Progress Notes (Signed)
She presents today complaining of sharp incurvated nail margins along the toes.  Objective: Vital signs are stable she is alert and 3 pulses are strongly palpable bilateral. Sharp incurvated nail margin along the tibiofibular border of the hallux bilaterally as well as the lesser digits 2 with no erythema cellulitis drainage or odor.  Assessment: Sharp incurvated nail margins which are painful for the patient.  Plan: Debridement of these lesions today.

## 2014-06-18 ENCOUNTER — Other Ambulatory Visit: Payer: Self-pay

## 2014-06-18 DIAGNOSIS — Z1231 Encounter for screening mammogram for malignant neoplasm of breast: Secondary | ICD-10-CM

## 2014-06-20 ENCOUNTER — Ambulatory Visit
Admission: RE | Admit: 2014-06-20 | Discharge: 2014-06-20 | Disposition: A | Discharge status: Home / Self Care | Payer: Medicare Other | Source: Ambulatory Visit

## 2014-06-20 DIAGNOSIS — Z1231 Encounter for screening mammogram for malignant neoplasm of breast: Secondary | ICD-10-CM

## 2014-06-21 ENCOUNTER — Ambulatory Visit: Payer: Medicare Other

## 2014-06-26 ENCOUNTER — Other Ambulatory Visit: Payer: Self-pay | Admitting: Family Medicine

## 2014-06-26 DIAGNOSIS — R928 Other abnormal and inconclusive findings on diagnostic imaging of breast: Secondary | ICD-10-CM

## 2014-07-12 ENCOUNTER — Ambulatory Visit
Admission: RE | Admit: 2014-07-12 | Discharge: 2014-07-12 | Disposition: A | Discharge status: Home / Self Care | Payer: Medicare Other | Source: Ambulatory Visit | Attending: Family Medicine | Admitting: Family Medicine

## 2014-07-12 ENCOUNTER — Other Ambulatory Visit: Payer: Self-pay | Admitting: Family Medicine

## 2014-07-12 DIAGNOSIS — N6320 Unspecified lump in the left breast, unspecified quadrant: Secondary | ICD-10-CM

## 2014-07-12 DIAGNOSIS — R928 Other abnormal and inconclusive findings on diagnostic imaging of breast: Secondary | ICD-10-CM

## 2014-07-20 ENCOUNTER — Other Ambulatory Visit: Payer: Self-pay | Admitting: Family Medicine

## 2014-07-20 DIAGNOSIS — N6320 Unspecified lump in the left breast, unspecified quadrant: Secondary | ICD-10-CM

## 2014-07-26 ENCOUNTER — Inpatient Hospital Stay: Admission: RE | Admit: 2014-07-26 | Payer: Medicare Other | Source: Ambulatory Visit

## 2014-08-01 ENCOUNTER — Ambulatory Visit
Admission: RE | Admit: 2014-08-01 | Discharge: 2014-08-01 | Disposition: A | Discharge status: Home / Self Care | Payer: Medicare Other | Source: Ambulatory Visit | Attending: Family Medicine | Admitting: Family Medicine

## 2014-08-01 DIAGNOSIS — N6320 Unspecified lump in the left breast, unspecified quadrant: Secondary | ICD-10-CM

## 2014-09-25 ENCOUNTER — Ambulatory Visit
Admit: 2014-09-25 | Disposition: A | Discharge status: Home / Self Care | Payer: Self-pay | Attending: Ophthalmology | Admitting: Ophthalmology

## 2014-10-09 NOTE — Discharge Summary (Signed)
PATIENT NAME:  Margaret [name], KIRSCHBAUM MR#:  [id number] DATE OF BIRTH:  11-14-1940  DATE OF ADMISSION:  02/08/2012 DATE OF DISCHARGE:  02/09/2012   DISCHARGE DIAGNOSES:  1. Unstable angina.  2. Hypertension.  3. Hyperlipidemia.   HISTORY: This is a 74 year old female with known coronary artery disease with progressive episodes of chest discomfort, shortness of breath, and an abnormal stress test consistent with inferior apical myocardial perfusion defect. The patient underwent cardiac catheterization showing diffuse mild atherosclerosis of the left anterior descending artery and circumflex artery with a significant distal right coronary artery stenosis of approximately 80%. The patient underwent PCI and stent placement with a drug-eluting stent in the distal portion and had further evaluation and treatment of her proximal stenosis which had no evidence of significant stenosis requiring further stenting. She was placed on appropriate medications for hypertension which was malignant at the time including Norvasc and had no further chest pain or complications. The patient was ambulating without any difficulty and discharged to home in good condition.   DISCHARGE MEDICATIONS:  1. Aspirin 81 mg p.o. daily.  2. Plavix 75 mg p.o. daily.   3. Hydrochlorothiazide 12.5 mg p.o. daily.  4. Nexium 40 mg p.o. daily.   FOLLOW-UP: She is to follow-up in six days for further treatment and adjustments of medications of issues listed above.   ____________________________ Corey Skains, MD [id number] D: 02/09/2012 08:32:05 ET T: 02/09/2012 13:15:21 ET JOB#: [id number]  cc: Corey Skains, MD, <Dictator> Corey Skains MD ELECTRONICALLY SIGNED 02/10/2012 7:44

## 2015-06-23 DIAGNOSIS — I639 Cerebral infarction, unspecified: Secondary | ICD-10-CM

## 2015-06-23 HISTORY — DX: Cerebral infarction, unspecified: I63.9

## 2015-07-08 ENCOUNTER — Ambulatory Visit (INDEPENDENT_AMBULATORY_CARE_PROVIDER_SITE_OTHER): Payer: Medicare Other | Admitting: Cardiology

## 2015-07-08 ENCOUNTER — Encounter: Payer: Self-pay | Admitting: Cardiology

## 2015-07-08 VITALS — BP 152/84 | HR 67 | Ht 68.5 in | Wt 195.0 lb

## 2015-07-08 DIAGNOSIS — E785 Hyperlipidemia, unspecified: Secondary | ICD-10-CM

## 2015-07-08 DIAGNOSIS — I2510 Atherosclerotic heart disease of native coronary artery without angina pectoris: Secondary | ICD-10-CM

## 2015-07-08 DIAGNOSIS — I10 Essential (primary) hypertension: Secondary | ICD-10-CM

## 2015-07-08 NOTE — Patient Instructions (Signed)
You are clear to proceed with shoulder surgery  I will see you in 6 months.
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
## 2015-10-17 ENCOUNTER — Encounter: Payer: Self-pay | Admitting: *Deleted

## 2015-11-06 DIAGNOSIS — I639 Cerebral infarction, unspecified: Secondary | ICD-10-CM | POA: Insufficient documentation

## 2015-12-02 ENCOUNTER — Ambulatory Visit: Admission: RE | Admit: 2015-12-02 | Payer: Medicare Other | Source: Ambulatory Visit | Admitting: Gastroenterology

## 2015-12-02 ENCOUNTER — Encounter: Admission: RE | Payer: Self-pay | Source: Ambulatory Visit

## 2015-12-02 HISTORY — DX: Polyp of colon: K63.5

## 2015-12-02 SURGERY — COLONOSCOPY WITH PROPOFOL
Anesthesia: General

## 2016-01-31 DIAGNOSIS — H53462 Homonymous bilateral field defects, left side: Secondary | ICD-10-CM | POA: Insufficient documentation

## 2016-03-20 ENCOUNTER — Telehealth: Payer: Self-pay | Admitting: Cardiology

## 2016-03-20 NOTE — Telephone Encounter (Signed)
Pt said she had a stroke 2 months ago and been having some tightness in her chest,not at the moment. She just thinks she need to be seen by Dr Martinique.Dr Martinique does not have any more availabilty this year.

## 2016-03-20 NOTE — Telephone Encounter (Signed)
Returned call to patient.She stated she had a stroke this past summer.Stated ever since stroke she has had intermittent chest tightness.No chest  tightness at present.Appointment scheduled with Rosaria Ferries PA 03/26/16 at 8:00 am.Advised to go to ER if needed.

## 2016-03-23 ENCOUNTER — Ambulatory Visit: Payer: Medicare Other | Admitting: Physician Assistant

## 2016-03-25 ENCOUNTER — Ambulatory Visit: Payer: Medicare Other | Admitting: Physician Assistant

## 2016-03-25 ENCOUNTER — Other Ambulatory Visit: Payer: Self-pay | Admitting: Otolaryngology

## 2016-03-25 DIAGNOSIS — R1319 Other dysphagia: Secondary | ICD-10-CM

## 2016-03-26 ENCOUNTER — Encounter: Payer: Self-pay | Admitting: Physician Assistant

## 2016-03-26 ENCOUNTER — Ambulatory Visit (INDEPENDENT_AMBULATORY_CARE_PROVIDER_SITE_OTHER): Payer: Medicare Other | Admitting: Physician Assistant

## 2016-03-26 VITALS — BP 180/78 | HR 45 | Wt 142.0 lb

## 2016-03-26 DIAGNOSIS — R600 Localized edema: Secondary | ICD-10-CM

## 2016-03-26 DIAGNOSIS — I10 Essential (primary) hypertension: Secondary | ICD-10-CM | POA: Diagnosis not present

## 2016-03-26 DIAGNOSIS — R079 Chest pain, unspecified: Secondary | ICD-10-CM

## 2016-03-26 DIAGNOSIS — I2510 Atherosclerotic heart disease of native coronary artery without angina pectoris: Secondary | ICD-10-CM
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
## 2016-03-26 NOTE — Patient Instructions (Signed)
Medication Instructions:  Continue current medications  Labwork: None Ordered  Testing/Procedures: Your physician has requested that you have a lexiscan myoview. For further information please visit [email or web address]. Please follow instruction sheet, as given.  Follow-Up: Your physician recommends that you schedule a follow-up appointment in: After Stress test   Any Other Special Instructions Will Be Listed Below (If Applicable).   If you need a refill on your cardiac medications before your next appointment, please call your pharmacy.

## 2016-03-27 DIAGNOSIS — Z8673 Personal history of transient ischemic attack (TIA), and cerebral infarction without residual deficits: Secondary | ICD-10-CM | POA: Insufficient documentation

## 2016-04-02 ENCOUNTER — Ambulatory Visit
Admission: RE | Admit: 2016-04-02 | Discharge: 2016-04-02 | Disposition: A | Discharge status: Home / Self Care | Payer: Medicare Other | Source: Ambulatory Visit | Attending: Otolaryngology | Admitting: Otolaryngology

## 2016-04-02 DIAGNOSIS — K449 Diaphragmatic hernia without obstruction or gangrene: Secondary | ICD-10-CM | POA: Insufficient documentation

## 2016-04-02 DIAGNOSIS — R1310 Dysphagia, unspecified: Secondary | ICD-10-CM | POA: Diagnosis not present

## 2016-04-02 DIAGNOSIS — R1319 Other dysphagia: Secondary | ICD-10-CM

## 2016-04-07 ENCOUNTER — Encounter (INDEPENDENT_AMBULATORY_CARE_PROVIDER_SITE_OTHER): Payer: Medicare Other | Admitting: Podiatry

## 2016-04-07 ENCOUNTER — Ambulatory Visit: Payer: Medicare Other | Admitting: Podiatry

## 2016-04-07 ENCOUNTER — Telehealth (HOSPITAL_COMMUNITY): Payer: Self-pay

## 2016-04-07 NOTE — Progress Notes (Signed)
This encounter was created in error - please disregard.

## 2016-04-07 NOTE — Telephone Encounter (Signed)
Encounter complete. 

## 2016-04-09 ENCOUNTER — Telehealth (HOSPITAL_COMMUNITY): Payer: Self-pay

## 2016-04-09 ENCOUNTER — Ambulatory Visit (HOSPITAL_COMMUNITY)
Admission: RE | Admit: 2016-04-09 | Discharge: 2016-04-09 | Disposition: A | Discharge status: Home / Self Care | Payer: Medicare Other | Source: Ambulatory Visit | Attending: Cardiovascular Disease | Admitting: Cardiovascular Disease

## 2016-04-09 DIAGNOSIS — R079 Chest pain, unspecified: Secondary | ICD-10-CM

## 2016-04-09 LAB — MYOCARDIAL PERFUSION IMAGING
CHL CUP NUCLEAR SDS: 2
CHL CUP RESTING HR STRESS: 51 {beats}/min
CSEPPHR: 90 {beats}/min
LV sys vol: 49 mL
LVDIAVOL: 107 mL (ref 46–106)
NUC STRESS TID: 1.11
SRS: 4
SSS: 6

## 2016-04-09 MED ORDER — AMINOPHYLLINE 25 MG/ML IV SOLN
75.0000 mg | Freq: Once | INTRAVENOUS | Status: AC
  Administered 2016-04-09: 75 mg via INTRAVENOUS

## 2016-04-09 MED ORDER — REGADENOSON 0.4 MG/5ML IV SOLN
0.4000 mg | Freq: Once | INTRAVENOUS | Status: AC
  Administered 2016-04-09: 0.4 mg via INTRAVENOUS

## 2016-04-09 MED ORDER — TECHNETIUM TC 99M TETROFOSMIN IV KIT
10.7000 | PACK | Freq: Once | INTRAVENOUS | Status: AC | PRN
  Administered 2016-04-09: 10.7 via INTRAVENOUS
  Filled 2016-04-09: qty 11

## 2016-04-09 MED ORDER — TECHNETIUM TC 99M TETROFOSMIN IV KIT
31.2000 | PACK | Freq: Once | INTRAVENOUS | Status: AC | PRN
  Administered 2016-04-09: 31.2 via INTRAVENOUS
  Filled 2016-04-09: qty 32

## 2016-04-09 NOTE — Telephone Encounter (Signed)
Encounter complete. 

## 2016-04-14 ENCOUNTER — Ambulatory Visit (INDEPENDENT_AMBULATORY_CARE_PROVIDER_SITE_OTHER): Payer: Medicare Other | Admitting: Podiatry

## 2016-04-14 ENCOUNTER — Encounter: Payer: Self-pay | Admitting: Podiatry

## 2016-04-14 DIAGNOSIS — L600 Ingrowing nail: Secondary | ICD-10-CM

## 2016-04-14 DIAGNOSIS — B351 Tinea unguium: Secondary | ICD-10-CM

## 2016-04-14 DIAGNOSIS — M79676 Pain in unspecified toe(s): Secondary | ICD-10-CM | POA: Diagnosis not present

## 2016-04-14 NOTE — Progress Notes (Signed)
She presents today with chief complaint of a recent stroke in April of this past year and is no longer able to trim her toenails.  Objective: Vital signs are stable alert and oriented 3. Pulses are palpable. Neurologic sensory is intact. Toenails are thick yellow dystrophic onychomycotic.  Assessment: Pain limb secondary to onychomycosis 1 through 5 bilateral.  Plan: Debridement of toenails 1 through 5 bilateral.
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
## 2016-04-30 ENCOUNTER — Ambulatory Visit (INDEPENDENT_AMBULATORY_CARE_PROVIDER_SITE_OTHER): Payer: Medicare Other | Admitting: Cardiology

## 2016-04-30 ENCOUNTER — Encounter: Payer: Self-pay | Admitting: Cardiology

## 2016-04-30 VITALS — BP 180/74 | HR 69 | Ht 68.0 in | Wt 144.4 lb

## 2016-04-30 DIAGNOSIS — I10 Essential (primary) hypertension: Secondary | ICD-10-CM

## 2016-04-30 DIAGNOSIS — I2510 Atherosclerotic heart disease of native coronary artery without angina pectoris: Secondary | ICD-10-CM

## 2016-04-30 DIAGNOSIS — E7800 Pure hypercholesterolemia, unspecified: Secondary | ICD-10-CM | POA: Diagnosis not present

## 2016-04-30 MED ORDER — AMLODIPINE BESYLATE 5 MG PO TABS: 5.0000 mg | ORAL_TABLET | Freq: Every day | ORAL | 3 refills | Status: DC

## 2016-04-30 NOTE — Patient Instructions (Signed)
Increase amlodipine to 5 mg daily  Continue your other therapy  I will see you in 6 months.

## 2016-06-17 ENCOUNTER — Emergency Department: Payer: Medicare Other

## 2016-06-17 ENCOUNTER — Encounter: Payer: Self-pay | Admitting: Emergency Medicine

## 2016-06-17 ENCOUNTER — Emergency Department
Admission: EM | Admit: 2016-06-17 | Discharge: 2016-06-17 | Disposition: A | Discharge status: Home / Self Care | Payer: Medicare Other | Attending: Emergency Medicine | Admitting: Emergency Medicine

## 2016-06-17 DIAGNOSIS — I2510 Atherosclerotic heart disease of native coronary artery without angina pectoris: Secondary | ICD-10-CM | POA: Diagnosis not present

## 2016-06-17 DIAGNOSIS — Z87891 Personal history of nicotine dependence: Secondary | ICD-10-CM | POA: Insufficient documentation

## 2016-06-17 DIAGNOSIS — R079 Chest pain, unspecified: Secondary | ICD-10-CM

## 2016-06-17 DIAGNOSIS — I10 Essential (primary) hypertension: Secondary | ICD-10-CM | POA: Diagnosis not present

## 2016-06-17 DIAGNOSIS — R0789 Other chest pain: Secondary | ICD-10-CM | POA: Insufficient documentation

## 2016-06-17 DIAGNOSIS — R51 Headache: Secondary | ICD-10-CM | POA: Diagnosis present

## 2016-06-17 DIAGNOSIS — R109 Unspecified abdominal pain: Secondary | ICD-10-CM

## 2016-06-17 DIAGNOSIS — Z79899 Other long term (current) drug therapy: Secondary | ICD-10-CM | POA: Diagnosis not present

## 2016-06-17 DIAGNOSIS — R519 Headache, unspecified: Secondary | ICD-10-CM

## 2016-06-17 HISTORY — DX: Cerebral infarction, unspecified: I63.9

## 2016-06-17 LAB — BASIC METABOLIC PANEL
Anion gap: 7 (ref 5–15)
BUN: 14 mg/dL (ref 6–20)
CO2: 25 mmol/L (ref 22–32)
CREATININE: 0.83 mg/dL (ref 0.44–1.00)
Calcium: 9.5 mg/dL (ref 8.9–10.3)
Chloride: 104 mmol/L (ref 101–111)
GFR calc non Af Amer: >60 mL/min (ref >60)
GLUCOSE: 136 mg/dL — AB (ref 65–99)
Potassium: 3.7 mmol/L (ref 3.5–5.1)
Sodium: 136 mmol/L (ref 135–145)

## 2016-06-17 LAB — CBC
HCT: 40.6 % (ref 35.0–47.0)
Hemoglobin: 13.8 g/dL (ref 12.0–16.0)
MCH: 30.3 pg (ref 26.0–34.0)
MCHC: 33.9 g/dL (ref 32.0–36.0)
MCV: 89.3 fL (ref 80.0–100.0)
PLATELETS: 249 10*3/uL (ref 150–440)
RBC: 4.54 MIL/uL (ref 3.80–5.20)
RDW: 14.8 % — ABNORMAL HIGH (ref 11.5–14.5)
WBC: 11.8 10*3/uL — ABNORMAL HIGH (ref 3.6–11.0)

## 2016-06-17 LAB — TROPONIN I: Troponin I: <0.03 ng/mL (ref <0.03)

## 2016-06-17 MED ORDER — METOPROLOL TARTRATE 25 MG PO TABS
12.5000 mg | ORAL_TABLET | Freq: Once | ORAL | Status: AC
  Administered 2016-06-17: 12.5 mg via ORAL
  Filled 2016-06-17: qty 1

## 2016-06-17 NOTE — Discharge Instructions (Signed)
Please return if you have any further problems. Please make sure you can keep your blood pressure medication down. Please recheck with your doctor as soon as possible tomorrow or the next day would be great.
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
## 2016-06-17 NOTE — ED Notes (Signed)
Pt. States HA for the past 24 hours. Pt. States hx of stroke this past summer.  Pt. States some abdominal pain upon waking this morning. Pt. States vomiting multiple times last night, pt. States feeling of gas.  Pt. States feeling better after vomiting.   Pt. Denies vomiting today.

## 2016-06-17 NOTE — ED Notes (Signed)
Reviewed pt's chart and labs; repeat troponin ordered and will recheck vs; pt updated on plan of care and wait time, voices good understanding

## 2016-06-17 NOTE — ED Triage Notes (Signed)
Pt c/o headache last night along with feeling like band around chest. Still had chest tightness this AM but has now went away. Has some right hand tingling. Reports worst headache she has had. Hx stroke last summer with visual deficits.  Has had vomiting. Headache has not improved per pt.

## 2016-06-24 DIAGNOSIS — M25531 Pain in right wrist: Secondary | ICD-10-CM | POA: Diagnosis not present

## 2016-06-24 DIAGNOSIS — M79641 Pain in right hand: Secondary | ICD-10-CM | POA: Diagnosis not present

## 2016-07-01 DIAGNOSIS — R441 Visual hallucinations: Secondary | ICD-10-CM | POA: Diagnosis not present

## 2016-07-01 DIAGNOSIS — G44221 Chronic tension-type headache, intractable: Secondary | ICD-10-CM | POA: Diagnosis not present

## 2016-07-01 DIAGNOSIS — Z8659 Personal history of other mental and behavioral disorders: Secondary | ICD-10-CM | POA: Diagnosis not present

## 2016-07-01 DIAGNOSIS — Z8673 Personal history of transient ischemic attack (TIA), and cerebral infarction without residual deficits: Secondary | ICD-10-CM | POA: Diagnosis not present

## 2016-07-01 DIAGNOSIS — H53462 Homonymous bilateral field defects, left side: Secondary | ICD-10-CM | POA: Diagnosis not present

## 2016-07-14 ENCOUNTER — Ambulatory Visit: Payer: Medicare Other | Admitting: Podiatry

## 2016-07-20 DIAGNOSIS — I10 Essential (primary) hypertension: Secondary | ICD-10-CM | POA: Diagnosis not present

## 2016-07-20 DIAGNOSIS — I63439 Cerebral infarction due to embolism of unspecified posterior cerebral artery: Secondary | ICD-10-CM | POA: Diagnosis not present

## 2016-07-20 DIAGNOSIS — K219 Gastro-esophageal reflux disease without esophagitis: Secondary | ICD-10-CM | POA: Diagnosis not present

## 2016-07-20 DIAGNOSIS — Z6822 Body mass index (BMI) 22.0-22.9, adult: Secondary | ICD-10-CM | POA: Diagnosis not present

## 2016-07-20 DIAGNOSIS — Z23 Encounter for immunization: Secondary | ICD-10-CM | POA: Diagnosis not present

## 2016-07-31 DIAGNOSIS — M25531 Pain in right wrist: Secondary | ICD-10-CM | POA: Diagnosis not present

## 2016-07-31 DIAGNOSIS — M79641 Pain in right hand: Secondary | ICD-10-CM | POA: Diagnosis not present

## 2016-09-09 DIAGNOSIS — M25531 Pain in right wrist: Secondary | ICD-10-CM | POA: Diagnosis not present

## 2016-09-25 DIAGNOSIS — Z08 Encounter for follow-up examination after completed treatment for malignant neoplasm: Secondary | ICD-10-CM | POA: Diagnosis not present

## 2016-09-25 DIAGNOSIS — Z85828 Personal history of other malignant neoplasm of skin: Secondary | ICD-10-CM | POA: Diagnosis not present

## 2016-09-25 DIAGNOSIS — S81811A Laceration without foreign body, right lower leg, initial encounter: Secondary | ICD-10-CM | POA: Diagnosis not present

## 2016-09-25 DIAGNOSIS — L821 Other seborrheic keratosis: Secondary | ICD-10-CM | POA: Diagnosis not present

## 2016-10-02 DIAGNOSIS — S81812A Laceration without foreign body, left lower leg, initial encounter: Secondary | ICD-10-CM | POA: Diagnosis not present

## 2016-10-02 DIAGNOSIS — Z6823 Body mass index (BMI) 23.0-23.9, adult: Secondary | ICD-10-CM | POA: Diagnosis not present

## 2016-10-02 DIAGNOSIS — I10 Essential (primary) hypertension: Secondary | ICD-10-CM | POA: Diagnosis not present

## 2016-10-02 DIAGNOSIS — Z79891 Long term (current) use of opiate analgesic: Secondary | ICD-10-CM | POA: Diagnosis not present

## 2016-10-02 DIAGNOSIS — I63439 Cerebral infarction due to embolism of unspecified posterior cerebral artery: Secondary | ICD-10-CM | POA: Diagnosis not present

## 2016-10-02 DIAGNOSIS — M1990 Unspecified osteoarthritis, unspecified site: Secondary | ICD-10-CM | POA: Diagnosis not present

## 2016-11-02 DIAGNOSIS — Z8673 Personal history of transient ischemic attack (TIA), and cerebral infarction without residual deficits: Secondary | ICD-10-CM | POA: Diagnosis not present

## 2016-11-02 DIAGNOSIS — G44221 Chronic tension-type headache, intractable: Secondary | ICD-10-CM | POA: Diagnosis not present

## 2016-11-02 DIAGNOSIS — R441 Visual hallucinations: Secondary | ICD-10-CM | POA: Diagnosis not present

## 2016-11-02 DIAGNOSIS — Z8659 Personal history of other mental and behavioral disorders: Secondary | ICD-10-CM | POA: Diagnosis not present

## 2016-11-02 DIAGNOSIS — H53462 Homonymous bilateral field defects, left side: Secondary | ICD-10-CM | POA: Diagnosis not present
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
## 2016-11-11 DIAGNOSIS — L506 Contact urticaria: Secondary | ICD-10-CM | POA: Diagnosis not present

## 2016-11-12 ENCOUNTER — Encounter: Payer: Self-pay | Admitting: Cardiology

## 2016-11-12 ENCOUNTER — Ambulatory Visit (INDEPENDENT_AMBULATORY_CARE_PROVIDER_SITE_OTHER): Payer: PPO | Admitting: Cardiology

## 2016-11-12 VITALS — BP 142/72 | HR 55 | Ht 68.0 in | Wt 156.4 lb

## 2016-11-12 DIAGNOSIS — E7800 Pure hypercholesterolemia, unspecified: Secondary | ICD-10-CM

## 2016-11-12 DIAGNOSIS — I10 Essential (primary) hypertension: Secondary | ICD-10-CM

## 2016-11-12 DIAGNOSIS — I2510 Atherosclerotic heart disease of native coronary artery without angina pectoris: Secondary | ICD-10-CM

## 2016-11-12 MED ORDER — HYDROCHLOROTHIAZIDE 25 MG PO TABS: 25.0000 mg | ORAL_TABLET | Freq: Every day | ORAL | 3 refills | Status: DC

## 2016-11-12 NOTE — Patient Instructions (Signed)
Continue your current therapy  I will see you in one year   

## 2016-12-04 DIAGNOSIS — H35033 Hypertensive retinopathy, bilateral: Secondary | ICD-10-CM | POA: Diagnosis not present

## 2016-12-04 DIAGNOSIS — H53412 Scotoma involving central area, left eye: Secondary | ICD-10-CM | POA: Diagnosis not present

## 2016-12-30 DIAGNOSIS — M79642 Pain in left hand: Secondary | ICD-10-CM | POA: Diagnosis not present

## 2016-12-30 DIAGNOSIS — M25511 Pain in right shoulder: Secondary | ICD-10-CM | POA: Diagnosis not present

## 2017-01-16 DIAGNOSIS — W19XXXS Unspecified fall, sequela: Secondary | ICD-10-CM | POA: Diagnosis not present

## 2017-01-16 DIAGNOSIS — S8011XA Contusion of right lower leg, initial encounter: Secondary | ICD-10-CM | POA: Diagnosis not present

## 2017-01-16 DIAGNOSIS — T1490XA Injury, unspecified, initial encounter: Secondary | ICD-10-CM | POA: Diagnosis not present

## 2017-01-29 DIAGNOSIS — Z96642 Presence of left artificial hip joint: Secondary | ICD-10-CM | POA: Diagnosis not present

## 2017-01-29 DIAGNOSIS — I10 Essential (primary) hypertension: Secondary | ICD-10-CM | POA: Diagnosis not present

## 2017-01-29 DIAGNOSIS — W010XXA Fall on same level from slipping, tripping and stumbling without subsequent striking against object, initial encounter: Secondary | ICD-10-CM | POA: Diagnosis not present

## 2017-01-29 DIAGNOSIS — Z8673 Personal history of transient ischemic attack (TIA), and cerebral infarction without residual deficits: Secondary | ICD-10-CM | POA: Diagnosis not present

## 2017-01-29 DIAGNOSIS — L97812 Non-pressure chronic ulcer of other part of right lower leg with fat layer exposed: Secondary | ICD-10-CM | POA: Diagnosis not present

## 2017-01-29 DIAGNOSIS — S8011XA Contusion of right lower leg, initial encounter: Secondary | ICD-10-CM | POA: Diagnosis not present

## 2017-01-29 DIAGNOSIS — L03115 Cellulitis of right lower limb: Secondary | ICD-10-CM | POA: Diagnosis not present

## 2017-01-29 DIAGNOSIS — Z955 Presence of coronary angioplasty implant and graft: Secondary | ICD-10-CM | POA: Diagnosis not present

## 2017-01-29 DIAGNOSIS — I878 Other specified disorders of veins: Secondary | ICD-10-CM | POA: Diagnosis not present

## 2017-01-29 DIAGNOSIS — I2510 Atherosclerotic heart disease of native coronary artery without angina pectoris: Secondary | ICD-10-CM | POA: Diagnosis not present

## 2017-01-29 DIAGNOSIS — I739 Peripheral vascular disease, unspecified: Secondary | ICD-10-CM | POA: Diagnosis not present

## 2017-01-29 DIAGNOSIS — Z79899 Other long term (current) drug therapy: Secondary | ICD-10-CM | POA: Diagnosis not present

## 2017-01-29 DIAGNOSIS — Z7902 Long term (current) use of antithrombotics/antiplatelets: Secondary | ICD-10-CM | POA: Diagnosis not present

## 2017-01-29 DIAGNOSIS — K219 Gastro-esophageal reflux disease without esophagitis: Secondary | ICD-10-CM | POA: Diagnosis not present

## 2017-01-29 DIAGNOSIS — Z87891 Personal history of nicotine dependence: Secondary | ICD-10-CM | POA: Diagnosis not present

## 2017-02-01 DIAGNOSIS — L97812 Non-pressure chronic ulcer of other part of right lower leg with fat layer exposed: Secondary | ICD-10-CM | POA: Diagnosis not present

## 2017-02-01 DIAGNOSIS — L03115 Cellulitis of right lower limb: Secondary | ICD-10-CM | POA: Diagnosis not present

## 2017-02-01 DIAGNOSIS — I2510 Atherosclerotic heart disease of native coronary artery without angina pectoris: Secondary | ICD-10-CM | POA: Diagnosis not present

## 2017-02-01 DIAGNOSIS — Z8673 Personal history of transient ischemic attack (TIA), and cerebral infarction without residual deficits: Secondary | ICD-10-CM | POA: Diagnosis not present

## 2017-02-01 DIAGNOSIS — I739 Peripheral vascular disease, unspecified: Secondary | ICD-10-CM | POA: Diagnosis not present

## 2017-02-01 DIAGNOSIS — S8011XA Contusion of right lower leg, initial encounter: Secondary | ICD-10-CM | POA: Diagnosis not present

## 2017-02-01 DIAGNOSIS — W010XXA Fall on same level from slipping, tripping and stumbling without subsequent striking against object, initial encounter: Secondary | ICD-10-CM | POA: Diagnosis not present

## 2017-02-01 DIAGNOSIS — I878 Other specified disorders of veins: Secondary | ICD-10-CM | POA: Diagnosis not present

## 2017-02-01 DIAGNOSIS — I10 Essential (primary) hypertension: Secondary | ICD-10-CM | POA: Diagnosis not present

## 2017-02-01 DIAGNOSIS — Z7901 Long term (current) use of anticoagulants: Secondary | ICD-10-CM | POA: Diagnosis not present

## 2017-02-01 DIAGNOSIS — Z7902 Long term (current) use of antithrombotics/antiplatelets: Secondary | ICD-10-CM | POA: Diagnosis not present

## 2017-02-01 DIAGNOSIS — K219 Gastro-esophageal reflux disease without esophagitis: Secondary | ICD-10-CM | POA: Diagnosis not present

## 2017-02-01 DIAGNOSIS — Z79899 Other long term (current) drug therapy: Secondary | ICD-10-CM | POA: Diagnosis not present

## 2017-02-08 DIAGNOSIS — I739 Peripheral vascular disease, unspecified: Secondary | ICD-10-CM | POA: Diagnosis not present

## 2017-02-08 DIAGNOSIS — I10 Essential (primary) hypertension: Secondary | ICD-10-CM | POA: Diagnosis not present

## 2017-02-08 DIAGNOSIS — Z7901 Long term (current) use of anticoagulants: Secondary | ICD-10-CM | POA: Diagnosis not present

## 2017-02-26 DIAGNOSIS — I83893 Varicose veins of bilateral lower extremities with other complications: Secondary | ICD-10-CM | POA: Diagnosis not present

## 2017-02-26 DIAGNOSIS — M7989 Other specified soft tissue disorders: Secondary | ICD-10-CM | POA: Diagnosis not present

## 2017-02-26 DIAGNOSIS — W548XXA Other contact with dog, initial encounter: Secondary | ICD-10-CM | POA: Diagnosis not present

## 2017-02-26 DIAGNOSIS — I739 Peripheral vascular disease, unspecified: Secondary | ICD-10-CM | POA: Diagnosis not present

## 2017-02-26 DIAGNOSIS — S81802A Unspecified open wound, left lower leg, initial encounter: Secondary | ICD-10-CM | POA: Diagnosis not present

## 2017-03-26 DIAGNOSIS — I83893 Varicose veins of bilateral lower extremities with other complications: Secondary | ICD-10-CM | POA: Diagnosis not present

## 2017-03-26 DIAGNOSIS — R600 Localized edema: Secondary | ICD-10-CM | POA: Diagnosis not present

## 2017-03-26 DIAGNOSIS — S81802S Unspecified open wound, left lower leg, sequela: Secondary | ICD-10-CM | POA: Diagnosis not present

## 2017-04-23 DIAGNOSIS — M1712 Unilateral primary osteoarthritis, left knee: Secondary | ICD-10-CM | POA: Diagnosis not present

## 2017-05-15 DIAGNOSIS — M1712 Unilateral primary osteoarthritis, left knee: Secondary | ICD-10-CM | POA: Diagnosis not present

## 2017-05-24 DIAGNOSIS — M1712 Unilateral primary osteoarthritis, left knee: Secondary | ICD-10-CM | POA: Diagnosis not present

## 2017-05-28 DIAGNOSIS — G44221 Chronic tension-type headache, intractable: Secondary | ICD-10-CM | POA: Diagnosis not present

## 2017-05-28 DIAGNOSIS — R441 Visual hallucinations: Secondary | ICD-10-CM | POA: Diagnosis not present

## 2017-05-28 DIAGNOSIS — H53462 Homonymous bilateral field defects, left side: Secondary | ICD-10-CM | POA: Diagnosis not present

## 2017-05-28 DIAGNOSIS — Z8659 Personal history of other mental and behavioral disorders: Secondary | ICD-10-CM | POA: Diagnosis not present

## 2017-05-28 DIAGNOSIS — Z8673 Personal history of transient ischemic attack (TIA), and cerebral infarction without residual deficits: Secondary | ICD-10-CM | POA: Diagnosis not present
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
## 2017-06-02 ENCOUNTER — Encounter: Payer: Self-pay | Admitting: Cardiology

## 2017-06-02 ENCOUNTER — Ambulatory Visit: Payer: PPO | Admitting: Cardiology

## 2017-06-02 VITALS — BP 198/80 | HR 70 | Ht 68.0 in | Wt 192.4 lb

## 2017-06-02 DIAGNOSIS — I2510 Atherosclerotic heart disease of native coronary artery without angina pectoris: Secondary | ICD-10-CM | POA: Diagnosis not present

## 2017-06-02 DIAGNOSIS — I10 Essential (primary) hypertension: Secondary | ICD-10-CM | POA: Diagnosis not present

## 2017-06-02 DIAGNOSIS — E7800 Pure hypercholesterolemia, unspecified: Secondary | ICD-10-CM

## 2017-06-02 NOTE — Patient Instructions (Addendum)
Continue your current medication but check your blood pressure and make sure it comes back down. If not let me know so we can stay on top of it.  Have primary care check basic lab work with lipids.   I will see you in 6 months.

## 2017-06-07 DIAGNOSIS — M1712 Unilateral primary osteoarthritis, left knee: Secondary | ICD-10-CM | POA: Diagnosis not present

## 2017-06-14 ENCOUNTER — Encounter: Payer: Self-pay | Admitting: *Deleted

## 2017-06-14 ENCOUNTER — Other Ambulatory Visit: Payer: Self-pay

## 2017-06-14 ENCOUNTER — Encounter
Admission: EM | Disposition: A | Discharge status: Home / Self Care | Payer: Self-pay | Source: Home / Self Care | Attending: Cardiovascular Disease

## 2017-06-14 ENCOUNTER — Inpatient Hospital Stay: Payer: PPO

## 2017-06-14 ENCOUNTER — Inpatient Hospital Stay
Admission: EM | Admit: 2017-06-14 | Discharge: 2017-06-16 | DRG: 246 | Disposition: A | Discharge status: Home / Self Care | Payer: PPO | Attending: Internal Medicine | Admitting: Internal Medicine

## 2017-06-14 ENCOUNTER — Emergency Department: Payer: PPO

## 2017-06-14 DIAGNOSIS — I255 Ischemic cardiomyopathy: Secondary | ICD-10-CM | POA: Diagnosis present

## 2017-06-14 DIAGNOSIS — E669 Obesity, unspecified: Secondary | ICD-10-CM | POA: Diagnosis present

## 2017-06-14 DIAGNOSIS — D72829 Elevated white blood cell count, unspecified: Secondary | ICD-10-CM | POA: Diagnosis present

## 2017-06-14 DIAGNOSIS — Z86010 Personal history of colonic polyps: Secondary | ICD-10-CM | POA: Diagnosis not present

## 2017-06-14 DIAGNOSIS — I110 Hypertensive heart disease with heart failure: Secondary | ICD-10-CM | POA: Diagnosis present

## 2017-06-14 DIAGNOSIS — Z96652 Presence of left artificial knee joint: Secondary | ICD-10-CM | POA: Diagnosis not present

## 2017-06-14 DIAGNOSIS — Z800 Family history of malignant neoplasm of digestive organs: Secondary | ICD-10-CM | POA: Diagnosis not present

## 2017-06-14 DIAGNOSIS — F329 Major depressive disorder, single episode, unspecified: Secondary | ICD-10-CM | POA: Diagnosis present

## 2017-06-14 DIAGNOSIS — Z7902 Long term (current) use of antithrombotics/antiplatelets: Secondary | ICD-10-CM

## 2017-06-14 DIAGNOSIS — I214 Non-ST elevation (NSTEMI) myocardial infarction: Principal | ICD-10-CM | POA: Diagnosis present

## 2017-06-14 DIAGNOSIS — Z87891 Personal history of nicotine dependence: Secondary | ICD-10-CM | POA: Diagnosis not present

## 2017-06-14 DIAGNOSIS — D509 Iron deficiency anemia, unspecified: Secondary | ICD-10-CM | POA: Diagnosis not present

## 2017-06-14 DIAGNOSIS — I2584 Coronary atherosclerosis due to calcified coronary lesion: Secondary | ICD-10-CM | POA: Diagnosis present

## 2017-06-14 DIAGNOSIS — E785 Hyperlipidemia, unspecified: Secondary | ICD-10-CM | POA: Diagnosis not present

## 2017-06-14 DIAGNOSIS — I2510 Atherosclerotic heart disease of native coronary artery without angina pectoris: Secondary | ICD-10-CM | POA: Diagnosis not present

## 2017-06-14 DIAGNOSIS — I340 Nonrheumatic mitral (valve) insufficiency: Secondary | ICD-10-CM | POA: Diagnosis not present

## 2017-06-14 DIAGNOSIS — R0789 Other chest pain: Secondary | ICD-10-CM | POA: Diagnosis not present

## 2017-06-14 DIAGNOSIS — Z955 Presence of coronary angioplasty implant and graft: Secondary | ICD-10-CM

## 2017-06-14 DIAGNOSIS — Z7982 Long term (current) use of aspirin: Secondary | ICD-10-CM

## 2017-06-14 DIAGNOSIS — R079 Chest pain, unspecified: Secondary | ICD-10-CM | POA: Diagnosis not present

## 2017-06-14 DIAGNOSIS — R4182 Altered mental status, unspecified: Secondary | ICD-10-CM | POA: Diagnosis not present

## 2017-06-14 DIAGNOSIS — Z8249 Family history of ischemic heart disease and other diseases of the circulatory system: Secondary | ICD-10-CM | POA: Diagnosis not present

## 2017-06-14 DIAGNOSIS — Z9884 Bariatric surgery status: Secondary | ICD-10-CM | POA: Diagnosis not present

## 2017-06-14 DIAGNOSIS — E876 Hypokalemia: Secondary | ICD-10-CM | POA: Diagnosis present

## 2017-06-14 DIAGNOSIS — Z881 Allergy status to other antibiotic agents status: Secondary | ICD-10-CM

## 2017-06-14 DIAGNOSIS — Z6829 Body mass index (BMI) 29.0-29.9, adult: Secondary | ICD-10-CM

## 2017-06-14 DIAGNOSIS — Z833 Family history of diabetes mellitus: Secondary | ICD-10-CM | POA: Diagnosis not present

## 2017-06-14 DIAGNOSIS — I10 Essential (primary) hypertension: Secondary | ICD-10-CM | POA: Diagnosis not present

## 2017-06-14 DIAGNOSIS — Z90710 Acquired absence of both cervix and uterus: Secondary | ICD-10-CM

## 2017-06-14 DIAGNOSIS — Z8673 Personal history of transient ischemic attack (TIA), and cerebral infarction without residual deficits: Secondary | ICD-10-CM

## 2017-06-14 DIAGNOSIS — Z79899 Other long term (current) drug therapy: Secondary | ICD-10-CM

## 2017-06-14 DIAGNOSIS — Z9049 Acquired absence of other specified parts of digestive tract: Secondary | ICD-10-CM

## 2017-06-14 DIAGNOSIS — Z23 Encounter for immunization: Secondary | ICD-10-CM | POA: Diagnosis not present

## 2017-06-14 DIAGNOSIS — Z91048 Other nonmedicinal substance allergy status: Secondary | ICD-10-CM

## 2017-06-14 DIAGNOSIS — F419 Anxiety disorder, unspecified: Secondary | ICD-10-CM | POA: Diagnosis present

## 2017-06-14 DIAGNOSIS — I5033 Acute on chronic diastolic (congestive) heart failure: Secondary | ICD-10-CM | POA: Diagnosis present

## 2017-06-14 DIAGNOSIS — I219 Acute myocardial infarction, unspecified: Secondary | ICD-10-CM

## 2017-06-14 DIAGNOSIS — R0602 Shortness of breath: Secondary | ICD-10-CM | POA: Diagnosis not present

## 2017-06-14 DIAGNOSIS — K219 Gastro-esophageal reflux disease without esophagitis: Secondary | ICD-10-CM | POA: Diagnosis present

## 2017-06-14 HISTORY — PX: LEFT HEART CATH AND CORONARY ANGIOGRAPHY: CATH118249

## 2017-06-14 HISTORY — DX: Acute myocardial infarction, unspecified: I21.9

## 2017-06-14 HISTORY — PX: CORONARY STENT INTERVENTION: CATH118234

## 2017-06-14 LAB — GLUCOSE, CAPILLARY: GLUCOSE-CAPILLARY: 192 mg/dL — AB (ref 65–99)

## 2017-06-14 LAB — CBC
HEMATOCRIT: 34.2 % — AB (ref 35.0–47.0)
Hemoglobin: 10.7 g/dL — ABNORMAL LOW (ref 12.0–16.0)
MCH: 23.7 pg — AB (ref 26.0–34.0)
MCHC: 31.4 g/dL — ABNORMAL LOW (ref 32.0–36.0)
MCV: 75.6 fL — AB (ref 80.0–100.0)
Platelets: 345 10*3/uL (ref 150–440)
RBC: 4.53 MIL/uL (ref 3.80–5.20)
RDW: 15.5 % — ABNORMAL HIGH (ref 11.5–14.5)
WBC: 10.3 10*3/uL (ref 3.6–11.0)

## 2017-06-14 LAB — APTT: aPTT: 89 seconds — ABNORMAL HIGH (ref 24–36)

## 2017-06-14 LAB — TROPONIN I
Troponin I: 7.58 ng/mL (ref <0.03)
Troponin I: 10.24 ng/mL (ref <0.03)
Troponin I: >65 ng/mL (ref <0.03)
Troponin I: >65 ng/mL (ref <0.03)

## 2017-06-14 LAB — PROTIME-INR
INR: 0.95
Prothrombin Time: 12.6 seconds (ref 11.4–15.2)

## 2017-06-14 LAB — BASIC METABOLIC PANEL
Anion gap: 6 (ref 5–15)
BUN: 19 mg/dL (ref 6–20)
CHLORIDE: 107 mmol/L (ref 101–111)
CO2: 25 mmol/L (ref 22–32)
Calcium: 8.9 mg/dL (ref 8.9–10.3)
Creatinine, Ser: 0.85 mg/dL (ref 0.44–1.00)
GFR calc non Af Amer: >60 mL/min (ref >60)
Glucose, Bld: 178 mg/dL — ABNORMAL HIGH (ref 65–99)
POTASSIUM: 3.8 mmol/L (ref 3.5–5.1)
SODIUM: 138 mmol/L (ref 135–145)

## 2017-06-14 LAB — CARDIAC CATHETERIZATION: CATHEFQUANT: 40 %

## 2017-06-14 LAB — POCT ACTIVATED CLOTTING TIME
Activated Clotting Time: 230 seconds
Activated Clotting Time: 268 seconds

## 2017-06-14 LAB — MRSA PCR SCREENING: MRSA by PCR: NEGATIVE

## 2017-06-14 SURGERY — LEFT HEART CATH AND CORONARY ANGIOGRAPHY
Anesthesia: Moderate Sedation

## 2017-06-14 MED ORDER — SODIUM CHLORIDE 0.9% FLUSH: 3.0000 mL | INTRAVENOUS | Status: DC | PRN

## 2017-06-14 MED ORDER — HYDRALAZINE HCL 20 MG/ML IJ SOLN
10.0000 mg | INTRAMUSCULAR | Status: AC | PRN
  Administered 2017-06-14: 10 mg via INTRAVENOUS

## 2017-06-14 MED ORDER — SODIUM CHLORIDE 0.9 % IV SOLN: 250.0000 mL | INTRAVENOUS | Status: DC | PRN

## 2017-06-14 MED ORDER — NITROGLYCERIN 0.4 MG SL SUBL
SUBLINGUAL_TABLET | SUBLINGUAL | Status: AC
  Administered 2017-06-14: 0.4 mg
  Filled 2017-06-14: qty 1

## 2017-06-14 MED ORDER — HEPARIN (PORCINE) IN NACL 2-0.9 UNIT/ML-% IJ SOLN
INTRAMUSCULAR | Status: AC
  Filled 2017-06-14: qty 500

## 2017-06-14 MED ORDER — ASPIRIN 300 MG RE SUPP: 300.0000 mg | RECTAL | Status: DC

## 2017-06-14 MED ORDER — ASPIRIN EC 81 MG PO TBEC
81.0000 mg | DELAYED_RELEASE_TABLET | Freq: Every day | ORAL | Status: DC
  Administered 2017-06-15 – 2017-06-16 (×2): 81 mg via ORAL
  Filled 2017-06-14 (×2): qty 1

## 2017-06-14 MED ORDER — TICAGRELOR 90 MG PO TABS
ORAL_TABLET | ORAL | Status: AC
  Filled 2017-06-14: qty 2

## 2017-06-14 MED ORDER — HEPARIN SODIUM (PORCINE) 1000 UNIT/ML IJ SOLN
INTRAMUSCULAR | Status: AC
  Filled 2017-06-14: qty 1

## 2017-06-14 MED ORDER — FENTANYL CITRATE (PF) 100 MCG/2ML IJ SOLN
INTRAMUSCULAR | Status: DC | PRN
  Administered 2017-06-14: 50 ug via INTRAVENOUS

## 2017-06-14 MED ORDER — MORPHINE SULFATE (PF) 2 MG/ML IV SOLN
INTRAVENOUS | Status: AC
  Filled 2017-06-14: qty 1

## 2017-06-14 MED ORDER — CLOPIDOGREL BISULFATE 75 MG PO TABS
300.0000 mg | ORAL_TABLET | Freq: Once | ORAL | Status: AC
  Administered 2017-06-14: 300 mg via ORAL
  Filled 2017-06-14: qty 4

## 2017-06-14 MED ORDER — METOPROLOL TARTRATE 25 MG PO TABS
25.0000 mg | ORAL_TABLET | Freq: Two times a day (BID) | ORAL | Status: DC
  Administered 2017-06-14: 25 mg via ORAL
  Filled 2017-06-14: qty 1

## 2017-06-14 MED ORDER — HEPARIN (PORCINE) IN NACL 100-0.45 UNIT/ML-% IJ SOLN
1050.0000 [IU]/h | INTRAMUSCULAR | Status: DC
  Administered 2017-06-14: 1050 [IU]/h via INTRAVENOUS
  Filled 2017-06-14: qty 250

## 2017-06-14 MED ORDER — SODIUM CHLORIDE 0.9 % IV SOLN
INTRAVENOUS | Status: DC
  Administered 2017-06-14: 10:00:00 via INTRAVENOUS

## 2017-06-14 MED ORDER — VERAPAMIL HCL 2.5 MG/ML IV SOLN
INTRAVENOUS | Status: DC | PRN
  Administered 2017-06-14: 2.5 mg via INTRA_ARTERIAL

## 2017-06-14 MED ORDER — LIDOCAINE HCL (PF) 1 % IJ SOLN
INTRAMUSCULAR | Status: AC
  Filled 2017-06-14: qty 30

## 2017-06-14 MED ORDER — SODIUM CHLORIDE 0.9% FLUSH: 3.0000 mL | Freq: Two times a day (BID) | INTRAVENOUS | Status: DC

## 2017-06-14 MED ORDER — CARVEDILOL 3.125 MG PO TABS
3.1250 mg | ORAL_TABLET | Freq: Two times a day (BID) | ORAL | Status: DC
  Administered 2017-06-14 – 2017-06-15 (×2): 3.125 mg via ORAL
  Filled 2017-06-14 (×2): qty 1

## 2017-06-14 MED ORDER — HYDRALAZINE HCL 20 MG/ML IJ SOLN
10.0000 mg | Freq: Once | INTRAMUSCULAR | Status: AC
  Administered 2017-06-14: 10 mg via INTRAVENOUS
  Filled 2017-06-14: qty 1

## 2017-06-14 MED ORDER — ONDANSETRON HCL 4 MG/2ML IJ SOLN
4.0000 mg | Freq: Four times a day (QID) | INTRAMUSCULAR | Status: DC | PRN
  Administered 2017-06-14: 4 mg via INTRAVENOUS
  Filled 2017-06-14: qty 2

## 2017-06-14 MED ORDER — HYDRALAZINE HCL 20 MG/ML IJ SOLN: 5.0000 mg | INTRAMUSCULAR | Status: DC | PRN

## 2017-06-14 MED ORDER — ASPIRIN 81 MG PO CHEW
324.0000 mg | CHEWABLE_TABLET | Freq: Once | ORAL | Status: AC
  Administered 2017-06-14: 324 mg via ORAL
  Filled 2017-06-14: qty 4

## 2017-06-14 MED ORDER — AMLODIPINE BESYLATE 5 MG PO TABS
5.0000 mg | ORAL_TABLET | Freq: Every day | ORAL | Status: DC
  Administered 2017-06-14 – 2017-06-15 (×2): 5 mg via ORAL
  Filled 2017-06-14 (×3): qty 1

## 2017-06-14 MED ORDER — NITROGLYCERIN 0.4 MG SL SUBL
0.4000 mg | SUBLINGUAL_TABLET | SUBLINGUAL | Status: AC | PRN
  Administered 2017-06-14 (×3): 0.4 mg via SUBLINGUAL
  Filled 2017-06-14: qty 1

## 2017-06-14 MED ORDER — MIDAZOLAM HCL 2 MG/2ML IJ SOLN
INTRAMUSCULAR | Status: AC
  Filled 2017-06-14: qty 2

## 2017-06-14 MED ORDER — HYDRALAZINE HCL 20 MG/ML IJ SOLN
INTRAMUSCULAR | Status: AC
  Filled 2017-06-14: qty 1

## 2017-06-14 MED ORDER — FENTANYL CITRATE (PF) 100 MCG/2ML IJ SOLN
INTRAMUSCULAR | Status: AC
  Filled 2017-06-14: qty 2

## 2017-06-14 MED ORDER — SODIUM CHLORIDE 0.9% FLUSH
3.0000 mL | Freq: Two times a day (BID) | INTRAVENOUS | Status: DC
  Administered 2017-06-14: 3 mL via INTRAVENOUS

## 2017-06-14 MED ORDER — ASPIRIN 81 MG PO CHEW: 81.0000 mg | CHEWABLE_TABLET | ORAL | Status: DC

## 2017-06-14 MED ORDER — NITROGLYCERIN 5 MG/ML IV SOLN
INTRAVENOUS | Status: AC
  Filled 2017-06-14: qty 10

## 2017-06-14 MED ORDER — MORPHINE SULFATE (PF) 2 MG/ML IV SOLN
2.0000 mg | INTRAVENOUS | Status: DC | PRN
  Administered 2017-06-14: 2 mg via INTRAVENOUS

## 2017-06-14 MED ORDER — ONDANSETRON HCL 4 MG/2ML IJ SOLN
4.0000 mg | Freq: Once | INTRAMUSCULAR | Status: AC
  Administered 2017-06-14: 4 mg via INTRAVENOUS

## 2017-06-14 MED ORDER — ACETAMINOPHEN 325 MG PO TABS
650.0000 mg | ORAL_TABLET | ORAL | Status: DC | PRN
  Administered 2017-06-14: 650 mg via ORAL
  Filled 2017-06-14: qty 2

## 2017-06-14 MED ORDER — NITROGLYCERIN IN D5W 200-5 MCG/ML-% IV SOLN: 0.0000 ug/min | INTRAVENOUS | Status: DC

## 2017-06-14 MED ORDER — METOPROLOL TARTRATE 25 MG PO TABS: 25.0000 mg | ORAL_TABLET | Freq: Two times a day (BID) | ORAL | Status: DC

## 2017-06-14 MED ORDER — SODIUM CHLORIDE 0.9 % WEIGHT BASED INFUSION: 1.0000 mL/kg/h | INTRAVENOUS | Status: DC

## 2017-06-14 MED ORDER — ONDANSETRON HCL 4 MG/2ML IJ SOLN
INTRAMUSCULAR | Status: AC
  Administered 2017-06-14: 4 mg via INTRAVENOUS
  Filled 2017-06-14: qty 2

## 2017-06-14 MED ORDER — LIDOCAINE HCL (PF) 1 % IJ SOLN
INTRAMUSCULAR | Status: DC | PRN
  Administered 2017-06-14: 10 mL

## 2017-06-14 MED ORDER — ASPIRIN 81 MG PO CHEW: 324.0000 mg | CHEWABLE_TABLET | ORAL | Status: DC

## 2017-06-14 MED ORDER — MIDAZOLAM HCL 2 MG/2ML IJ SOLN
INTRAMUSCULAR | Status: AC
  Administered 2017-06-14: 1 mg via INTRAVENOUS
  Filled 2017-06-14: qty 2

## 2017-06-14 MED ORDER — ATORVASTATIN CALCIUM 20 MG PO TABS
80.0000 mg | ORAL_TABLET | Freq: Every day | ORAL | Status: DC
  Administered 2017-06-14 – 2017-06-15 (×2): 80 mg via ORAL
  Filled 2017-06-14 (×2): qty 4

## 2017-06-14 MED ORDER — VERAPAMIL HCL 2.5 MG/ML IV SOLN
INTRAVENOUS | Status: AC
  Filled 2017-06-14: qty 2

## 2017-06-14 MED ORDER — MIDAZOLAM HCL 2 MG/2ML IJ SOLN
INTRAMUSCULAR | Status: DC | PRN
  Administered 2017-06-14 (×2): 1 mg via INTRAVENOUS

## 2017-06-14 MED ORDER — PNEUMOCOCCAL VAC POLYVALENT 25 MCG/0.5ML IJ INJ: 0.5000 mL | INJECTION | INTRAMUSCULAR | Status: DC

## 2017-06-14 MED ORDER — PANTOPRAZOLE SODIUM 40 MG PO TBEC
40.0000 mg | DELAYED_RELEASE_TABLET | Freq: Every day | ORAL | Status: DC
  Administered 2017-06-14 – 2017-06-16 (×3): 40 mg via ORAL
  Filled 2017-06-14 (×3): qty 1

## 2017-06-14 MED ORDER — ENOXAPARIN SODIUM 40 MG/0.4ML ~~LOC~~ SOLN
40.0000 mg | SUBCUTANEOUS | Status: DC
  Administered 2017-06-15 – 2017-06-16 (×2): 40 mg via SUBCUTANEOUS
  Filled 2017-06-14 (×3): qty 0.4

## 2017-06-14 MED ORDER — IOPAMIDOL (ISOVUE-300) INJECTION 61%
INTRAVENOUS | Status: DC | PRN
  Administered 2017-06-14: 180 mL via INTRA_ARTERIAL

## 2017-06-14 MED ORDER — FUROSEMIDE 10 MG/ML IJ SOLN
40.0000 mg | Freq: Once | INTRAMUSCULAR | Status: AC
  Administered 2017-06-14: 40 mg via INTRAVENOUS
  Filled 2017-06-14: qty 4

## 2017-06-14 MED ORDER — MIDAZOLAM HCL 2 MG/2ML IJ SOLN
1.0000 mg | Freq: Once | INTRAMUSCULAR | Status: AC
  Administered 2017-06-14: 1 mg via INTRAVENOUS

## 2017-06-14 MED ORDER — HEPARIN SODIUM (PORCINE) 1000 UNIT/ML IJ SOLN
INTRAMUSCULAR | Status: DC | PRN
  Administered 2017-06-14: 4000 [IU] via INTRAVENOUS
  Administered 2017-06-14: 2000 [IU] via INTRAVENOUS
  Administered 2017-06-14: 4000 [IU] via INTRAVENOUS

## 2017-06-14 MED ORDER — SERTRALINE HCL 50 MG PO TABS
25.0000 mg | ORAL_TABLET | Freq: Every day | ORAL | Status: DC
  Administered 2017-06-14 – 2017-06-16 (×3): 25 mg via ORAL
  Filled 2017-06-14 (×3): qty 1

## 2017-06-14 MED ORDER — FENTANYL CITRATE (PF) 100 MCG/2ML IJ SOLN
INTRAMUSCULAR | Status: DC | PRN
  Administered 2017-06-14 (×2): 25 ug via INTRAVENOUS

## 2017-06-14 MED ORDER — ATORVASTATIN CALCIUM 20 MG PO TABS: 80.0000 mg | ORAL_TABLET | Freq: Every day | ORAL | Status: DC

## 2017-06-14 MED ORDER — NITROGLYCERIN 1 MG/10 ML FOR IR/CATH LAB
INTRA_ARTERIAL | Status: DC | PRN
  Administered 2017-06-14: 200 ug via INTRACORONARY
  Administered 2017-06-14: 100 ug via INTRACORONARY
  Administered 2017-06-14: 200 ug via INTRACORONARY

## 2017-06-14 MED ORDER — IOPAMIDOL (ISOVUE-300) INJECTION 61%
INTRAVENOUS | Status: DC | PRN
  Administered 2017-06-14: 30 mL via INTRA_ARTERIAL

## 2017-06-14 MED ORDER — SODIUM CHLORIDE 0.9% FLUSH
3.0000 mL | Freq: Two times a day (BID) | INTRAVENOUS | Status: DC
  Administered 2017-06-14 – 2017-06-16 (×4): 3 mL via INTRAVENOUS

## 2017-06-14 MED ORDER — NITROGLYCERIN IN D5W 200-5 MCG/ML-% IV SOLN
INTRAVENOUS | Status: AC | PRN
  Administered 2017-06-14: 10 ug/min via INTRAVENOUS

## 2017-06-14 MED ORDER — MIDAZOLAM HCL 2 MG/2ML IJ SOLN
INTRAMUSCULAR | Status: DC | PRN
  Administered 2017-06-14: 1 mg via INTRAVENOUS

## 2017-06-14 MED ORDER — TICAGRELOR 90 MG PO TABS
90.0000 mg | ORAL_TABLET | Freq: Two times a day (BID) | ORAL | Status: DC
  Administered 2017-06-14 – 2017-06-16 (×4): 90 mg via ORAL
  Filled 2017-06-14 (×5): qty 1

## 2017-06-14 MED ORDER — METOCLOPRAMIDE HCL 5 MG/ML IJ SOLN
10.0000 mg | Freq: Four times a day (QID) | INTRAMUSCULAR | Status: DC | PRN
  Administered 2017-06-14: 10 mg via INTRAVENOUS
  Filled 2017-06-14 (×2): qty 2

## 2017-06-14 MED ORDER — HEPARIN BOLUS VIA INFUSION
4000.0000 [IU] | Freq: Once | INTRAVENOUS | Status: AC
  Administered 2017-06-14: 4000 [IU] via INTRAVENOUS
  Filled 2017-06-14: qty 4000

## 2017-06-14 MED ORDER — TICAGRELOR 90 MG PO TABS
ORAL_TABLET | ORAL | Status: DC | PRN
  Administered 2017-06-14: 180 mg via ORAL

## 2017-06-14 SURGICAL SUPPLY — 22 items
BALLN TREK RX 2.5X20 (BALLOONS) ×3
BALLN TREK RX 2.75X20 (BALLOONS) ×3
BALLN ~~LOC~~ EUPHORA RX 2.75X20 (BALLOONS) ×3
BALLN ~~LOC~~ TREK RX 3.0X20 (BALLOONS) ×3
BALLOON TREK RX 2.5X20 (BALLOONS) ×1 IMPLANT
BALLOON TREK RX 2.75X20 (BALLOONS) ×1 IMPLANT
BALLOON ~~LOC~~ EUPHORA RX 2.75X20 (BALLOONS) ×1 IMPLANT
BALLOON ~~LOC~~ TREK RX 3.0X20 (BALLOONS) ×1 IMPLANT
CATH 5F 110X4 TIG (CATHETERS) ×3 IMPLANT
CATH LAUNCHER 6FR JR4 (CATHETERS) ×3 IMPLANT
CATH PRIORITY ONE AC 6F (CATHETERS) ×3 IMPLANT
DEVICE INFLAT 30 PLUS (MISCELLANEOUS) ×3 IMPLANT
DEVICE RAD TR BAND REGULAR (VASCULAR PRODUCTS) ×3 IMPLANT
GLIDESHEATH SLEND SS 6F .021 (SHEATH) ×3 IMPLANT
KIT MANI 3VAL PERCEP (MISCELLANEOUS) ×3 IMPLANT
PACK CARDIAC CATH (CUSTOM PROCEDURE TRAY) ×3 IMPLANT
STENT SIERRA 2.50 X 38 MM (Permanent Stent) ×3 IMPLANT
STENT SIERRA 2.75 X 28 MM (Permanent Stent) ×3 IMPLANT
WIRE HITORQ VERSACORE ST 145CM (WIRE) ×3 IMPLANT
WIRE INTUITION PROPEL ST 180CM (WIRE) ×3 IMPLANT
WIRE ROSEN-J .035X260CM (WIRE) ×3 IMPLANT
WIRE RUNTHROUGH .014X180CM (WIRE) ×3 IMPLANT

## 2017-06-14 SURGICAL SUPPLY — 8 items
CATH VISTA GUIDE 6FR JR4 (CATHETERS) ×2 IMPLANT
CATH VISTA GUIDE 6FR JR4 SH (CATHETERS) ×2 IMPLANT
DEVICE CLOSURE MYNXGRIP 6/7F (Vascular Products) ×2 IMPLANT
KIT MANI 3VAL PERCEP (MISCELLANEOUS) ×2 IMPLANT
NEEDLE PERC 18GX7CM (NEEDLE) ×2 IMPLANT
PACK CARDIAC CATH (CUSTOM PROCEDURE TRAY) ×2 IMPLANT
SHEATH AVANTI 6FR X 11CM (SHEATH) ×2 IMPLANT
WIRE GUIDERIGHT .035X150 (WIRE) ×2 IMPLANT

## 2017-06-14 NOTE — ED Notes (Signed)
Patient transported to XR. 

## 2017-06-14 NOTE — Progress Notes (Signed)
Lab called with a critical tropin of >65. Dr. Fletcher Anon notified. No new orders at this time.
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
## 2017-06-14 NOTE — Progress Notes (Signed)
ANTICOAGULATION CONSULT NOTE - Initial Consult  Pharmacy Consult for heparin Indication: chest pain/ACS  Allergies  Allergen Reactions  . Erythromycin Nausea Only  . Other Rash    Micropore tape  . Tape Itching and Rash    Also "band-aids"    Patient Measurements: Height: 5\' 8"  (172.7 cm) Weight: 192 lb (87.1 kg) IBW/kg (Calculated) : 63.9 Heparin Dosing Weight: 87 kg  Vital Signs: Temp: 98 F (36.7 C) (12/24 0552) Temp Source: Oral (12/24 0552) BP: 171/69 (12/24 0552) Pulse Rate: 52 (12/24 0552)  Labs: Recent Labs    06/14/17 0556  HGB 10.7*  HCT 34.2*  PLT 345  LABPROT 12.6  INR 0.95  CREATININE 0.85  TROPONINI 7.58*    Estimated Creatinine Clearance: 63 mL/min (by C-G formula based on SCr of 0.85 mg/dL).   Medical History: Past Medical History:  Diagnosis Date  . Anxiety and depression   . CAD (coronary artery disease)   . Cellulitis of right leg 2005  . Colon polyp   . Dyspnea on exertion   . GERD (gastroesophageal reflux disease)   . Hyperlipidemia   . Hypertension   . Morbid obesity ([place])   . Presence of stent in right coronary artery 02/08/12   2.5x18 Xience distal RCA  . Right shoulder pain   . Stroke ([place])   . Vaginal prolapse     Medications:  Scheduled:  . aspirin  324 mg Oral NOW   Or  . aspirin  300 mg Rectal NOW  . [START ON 06/15/2017] aspirin EC  81 mg Oral Daily  . atorvastatin  80 mg Oral q1800  . clopidogrel  300 mg Oral Once  . heparin  4,000 Units Intravenous Once  . metoprolol tartrate  25 mg Oral BID  . sodium chloride flush  3 mL Intravenous Q12H    Assessment: Patient admitted for CP found to have trops up to 7.58 No anticoagulants PTA except ASA/Plavix Patient is being started on heparin drip for NSTEMI  Goal of Therapy:  Heparin level 0.3-0.7 units/ml Monitor platelets by anticoagulation protocol: Yes   Plan:  Will bolus patient w/ heparin 4000 units IV x 1 Will start drip @ 1050 units/hr Will check HL @  1500  Baseline labs drawn Will monitor daily CBC's and will adjust per HL's  Tobie Lords, PharmD, BCPS Clinical Pharmacist 06/14/2017

## 2017-06-14 NOTE — Progress Notes (Signed)
Dr. Fletcher Anon at bedside, states he will do a diagnostic cath.

## 2017-06-14 NOTE — Progress Notes (Signed)
The patient is status post cardiac cath with 2 stent placement. She has no complaints except nausea.  BP is elevated. She was given Zofran and Reglan as needed, hydralazine IV as needed.  Started Coreg twice daily. She was on heparin drip.  Start Brilinta and Lipitor per Dr. Fletcher Anon.  Discussed with the patient, her daughter and RN.

## 2017-06-14 NOTE — ED Notes (Signed)
Patient c/o bilateral hand numbness, right ankle numbness X 2 days. Patient c/o left chest pain, radiating to right shoulder and neck described as tightness beginning Saturday evening. Patient c/o SOB, denies dizziness, lightheadedness, N/V, weakness. Patient reports productive cough for 4-6 weeks.
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
## 2017-06-14 NOTE — Progress Notes (Signed)
Pt vomiting at this time, Dr. Fletcher Anon paged, VORB for IV zofran 4mg .

## 2017-06-14 NOTE — Progress Notes (Signed)
This RN notified that patient will not be returning to 2A. Belongings taken to ICU.

## 2017-06-14 NOTE — Progress Notes (Signed)
Pt assisted to bedside commode

## 2017-06-14 NOTE — Progress Notes (Signed)
Pt having continued nausea, unable to have BM despite attempt, pt hyperventilating intermittently, restless in bed, putting weight on and moving left arm, c/o pain all over.  PRN morphine given, Dr. Bridgett Larsson paged, Dr. Bridgett Larsson advised to call Dr. Fletcher Anon, Dr. Fletcher Anon called, request EKG and states he will be down to see pt shortly.  Pt frequently coached to keep left arm still and slow down breathing.  Pt placed on 2L via [place] for comfort

## 2017-06-14 NOTE — ED Notes (Signed)
Pharmacy tech at bedside 

## 2017-06-14 NOTE — ED Notes (Signed)
Called pharmacy to request Heparin

## 2017-06-14 NOTE — Progress Notes (Signed)
The patient had complex RCA PCI with 2 drug-eluting stent placement earlier.  No complications during the case.  However, in the recovery area, she started having vomiting, elevated blood pressure, chest pain and shortness of breath.  She was given Zofran and Phenergan with no relief.  She appeared to be extremely anxious.  A repeat EKG was performed which showed no new changes.  However, we could not control her chest pain and shortness of breath and due to that, I recommended proceeding with relook angiography to ensure patency of the RCA stents. Catheterization was done via the right femoral artery and showed patent RCA stents with no significant change. She was noted to have an elevated left ventricular end-diastolic pressure in the low 30s indicating volume overload.  Her blood pressure was also greater than 229 systolic. I started the patient on intravenous nitroglycerin drip to be titrated for blood pressure control. I also ordered a Foley catheter and 1 dose of IV furosemide. Given the patient's mental status change, I ordered a stat CT head to rule out bleed.  It is possible that the patient might be reacting to sedation given her prior stroke.  Reversal of sedation might need to be considered.  The patient received Versed and fentanyl throughout the day. I updated the patient's daughter about the condition.

## 2017-06-14 NOTE — ED Notes (Signed)
AAOx3.  Skin warm and dry.  NAD 

## 2017-06-14 NOTE — Progress Notes (Signed)
Patient going down to Special Procedures at this time.  Consent signed at bedside.

## 2017-06-14 NOTE — Progress Notes (Signed)
Pt sitting up in bed eating and drinking w/o difficulty at this time in NAD, family at bedside.  Dr. Fletcher Anon at bedside earlier to discuss procedure

## 2017-06-14 NOTE — ED Notes (Signed)
AAOx3.  Skin warm and dry,  States chest pain is improving.  Currently rates pain 3/10 right upper chest - tightness.  States Nausea is resolved.  C/O headache 4/10- tylenol given per MAR.  Continue to monitor.

## 2017-06-14 NOTE — ED Notes (Signed)
Patient returned from xray. MD Pyreddy at bedside

## 2017-06-14 NOTE — ED Triage Notes (Signed)
Pt c/o chest pain x 1 day. Pt c/o shortness of breath and coughing x 4 days. Pt c/o R shoulder pain starting this morning. Pt c/o sore throat x 1 day. Pt denies n/v/d/fever. Pt in no acute distress at this time. Ambulatory into the ED.

## 2017-06-15 ENCOUNTER — Inpatient Hospital Stay (HOSPITAL_COMMUNITY)
Admit: 2017-06-15 | Discharge: 2017-06-15 | Disposition: A | Discharge status: Home / Self Care | Payer: PPO | Attending: Cardiovascular Disease | Admitting: Cardiovascular Disease

## 2017-06-15 DIAGNOSIS — I340 Nonrheumatic mitral (valve) insufficiency: Secondary | ICD-10-CM

## 2017-06-15 LAB — ECHOCARDIOGRAM COMPLETE
CHL CUP MV DEC (S): 246
E/e' ratio: 20.04
EWDT: 246 ms
FS: 29 % (ref 28–44)
Height: 68 in
IV/PV OW: 0.91
LA vol: 98.6 mL
LADIAMINDEX: 2.03 cm/m2
LASIZE: 42 mm
LAVOLA4C: 82 mL
LAVOLIN: 47.5 mL/m2
LEFT ATRIUM END SYS DIAM: 42 mm
LV E/e' medial: 20.04
LV PW d: 13.2 mm — AB (ref 0.6–1.1)
LV TDI E'LATERAL: 5.44
LV TDI E'MEDIAL: 4.68
LVEEAVG: 20.04
LVELAT: 5.44 cm/s
MV pk A vel: 99.2 m/s
MV pk E vel: 109 m/s
MVAP: 3.06 cm2
MVPG: 5 mmHg
MVSPHT: 72 ms
RV LATERAL S' VELOCITY: 15 cm/s
TAPSE: 24.9 mm
Weight: 3093.49 oz

## 2017-06-15 LAB — CBC
HEMATOCRIT: 31 % — AB (ref 35.0–47.0)
Hemoglobin: 9.8 g/dL — ABNORMAL LOW (ref 12.0–16.0)
MCH: 23.6 pg — AB (ref 26.0–34.0)
MCHC: 31.8 g/dL — AB (ref 32.0–36.0)
MCV: 74.3 fL — ABNORMAL LOW (ref 80.0–100.0)
Platelets: 329 10*3/uL (ref 150–440)
RBC: 4.18 MIL/uL (ref 3.80–5.20)
RDW: 15.9 % — ABNORMAL HIGH (ref 11.5–14.5)
WBC: 14 10*3/uL — AB (ref 3.6–11.0)

## 2017-06-15 LAB — BASIC METABOLIC PANEL
ANION GAP: 8 (ref 5–15)
BUN: 24 mg/dL — AB (ref 6–20)
CHLORIDE: 105 mmol/L (ref 101–111)
CO2: 25 mmol/L (ref 22–32)
Calcium: 8.6 mg/dL — ABNORMAL LOW (ref 8.9–10.3)
Creatinine, Ser: 0.9 mg/dL (ref 0.44–1.00)
GFR calc Af Amer: >60 mL/min (ref >60)
GFR calc non Af Amer: 60 mL/min — ABNORMAL LOW (ref >60)
GLUCOSE: 138 mg/dL — AB (ref 65–99)
POTASSIUM: 3.1 mmol/L — AB (ref 3.5–5.1)
Sodium: 138 mmol/L (ref 135–145)

## 2017-06-15 LAB — LIPID PANEL
CHOL/HDL RATIO: 3.8 ratio
Cholesterol: 225 mg/dL — ABNORMAL HIGH (ref 0–200)
HDL: 60 mg/dL (ref >40)
LDL Cholesterol: 146 mg/dL — ABNORMAL HIGH (ref 0–99)
Triglycerides: 96 mg/dL (ref <150)
VLDL: 19 mg/dL (ref 0–40)

## 2017-06-15 LAB — MAGNESIUM: MAGNESIUM: 2 mg/dL (ref 1.7–2.4)

## 2017-06-15 LAB — OCCULT BLOOD X 1 CARD TO LAB, STOOL: FECAL OCCULT BLD: NEGATIVE

## 2017-06-15 MED ORDER — POTASSIUM CHLORIDE CRYS ER 20 MEQ PO TBCR
60.0000 meq | EXTENDED_RELEASE_TABLET | Freq: Once | ORAL | Status: AC
  Administered 2017-06-15: 60 meq via ORAL
  Filled 2017-06-15: qty 3

## 2017-06-15 MED ORDER — LOSARTAN POTASSIUM 25 MG PO TABS
25.0000 mg | ORAL_TABLET | Freq: Every day | ORAL | Status: DC
  Administered 2017-06-15 – 2017-06-16 (×2): 25 mg via ORAL
  Filled 2017-06-15 (×2): qty 1

## 2017-06-15 MED ORDER — DOCUSATE SODIUM 100 MG PO CAPS
100.0000 mg | ORAL_CAPSULE | Freq: Two times a day (BID) | ORAL | Status: DC
  Administered 2017-06-15 – 2017-06-16 (×3): 100 mg via ORAL
  Filled 2017-06-15 (×3): qty 1

## 2017-06-15 MED ORDER — SENNA 8.6 MG PO TABS: 1.0000 | ORAL_TABLET | Freq: Every day | ORAL | Status: DC | PRN

## 2017-06-15 MED ORDER — PNEUMOCOCCAL VAC POLYVALENT 25 MCG/0.5ML IJ INJ
0.5000 mL | INJECTION | INTRAMUSCULAR | Status: AC
  Administered 2017-06-16: 0.5 mL via INTRAMUSCULAR

## 2017-06-15 MED ORDER — BISACODYL 5 MG PO TBEC: 5.0000 mg | DELAYED_RELEASE_TABLET | Freq: Every day | ORAL | Status: DC | PRN

## 2017-06-15 MED ORDER — CARVEDILOL 6.25 MG PO TABS
6.2500 mg | ORAL_TABLET | Freq: Two times a day (BID) | ORAL | Status: DC
  Administered 2017-06-15 – 2017-06-16 (×2): 6.25 mg via ORAL
  Filled 2017-06-15 (×2): qty 1

## 2017-06-15 NOTE — Progress Notes (Signed)
Pt appears ready for transfer to Tele floor. Please let us know if we can be of any assistance  Merton Border, MD PCCM service Mobile [phone] Pager [phone] 06/15/2017 9:49 AM

## 2017-06-15 NOTE — Progress Notes (Signed)
[facility] [place] NAME: Margaret [name]    MR#:  [id number]  DATE OF BIRTH:  July 29, 1940  SUBJECTIVE:  CHIEF COMPLAINT:   Chief Complaint  Patient presents with  . Chest Pain   The patient has no complaints. REVIEW OF SYSTEMS:  Review of Systems  Constitutional: Negative for chills, fever and malaise/fatigue.  HENT: Negative for sore throat.   Eyes: Negative for blurred vision and double vision.  Respiratory: Negative for cough, hemoptysis, shortness of breath, wheezing and stridor.   Cardiovascular: Negative for chest pain, palpitations, orthopnea and leg swelling.  Gastrointestinal: Positive for constipation. Negative for abdominal pain, blood in stool, diarrhea, melena, nausea and vomiting.  Genitourinary: Negative for dysuria, flank pain and hematuria.  Musculoskeletal: Negative for back pain and joint pain.  Skin: Negative for rash.  Neurological: Negative for dizziness, sensory change, focal weakness, seizures, loss of consciousness, weakness and headaches.  Endo/Heme/Allergies: Negative for polydipsia.  Psychiatric/Behavioral: Negative for depression. The patient is not nervous/anxious.     DRUG ALLERGIES:   Allergies  Allergen Reactions  . Erythromycin Nausea Only  . Other Rash    Micropore tape  . Tape Itching and Rash    Also "band-aids"   VITALS:  Blood pressure (!) 141/56, pulse (!) 59, temperature 98.3 F (36.8 C), temperature source Oral, resp. rate 18, height 5\' 8"  (1.727 m), weight 193 lb 5.5 oz (87.7 kg), SpO2 99 %. PHYSICAL EXAMINATION:  Physical Exam  Constitutional: She is oriented to person, place, and time and well-developed, well-nourished, and in no distress.  HENT:  Head: Normocephalic.  Mouth/Throat: Oropharynx is clear and moist.  Eyes: Conjunctivae and EOM are normal. Pupils are equal, round, and reactive to light. No scleral icterus.  Neck: Normal range of motion. Neck supple. No JVD present.  No tracheal deviation present.  Cardiovascular: Normal rate, regular rhythm and normal heart sounds. Exam reveals no gallop.  No murmur heard. Pulmonary/Chest: Effort normal and breath sounds normal. No respiratory distress. She has no wheezes. She has no rales.  Abdominal: Soft. Bowel sounds are normal. She exhibits no distension. There is no tenderness. There is no rebound.  Musculoskeletal: Normal range of motion. She exhibits no edema or tenderness.  Neurological: She is alert and oriented to person, place, and time. No cranial nerve deficit.  Skin: No rash noted. No erythema.  Psychiatric: Affect normal.   LABORATORY PANEL:  Female CBC Recent Labs  Lab 06/15/17 0517  WBC 14.0*  HGB 9.8*  HCT 31.0*  PLT 329   ------------------------------------------------------------------------------------------------------------------ Chemistries  Recent Labs  Lab 06/15/17 0517  NA 138  K 3.1*  CL 105  CO2 25  GLUCOSE 138*  BUN 24*  CREATININE 0.90  CALCIUM 8.6*   RADIOLOGY:  Ct Head Wo Contrast  Result Date: 06/14/2017 CLINICAL DATA:  Meds status changes after cardiac catheterization. EXAM: CT HEAD WITHOUT CONTRAST TECHNIQUE: Contiguous axial images were obtained from the base of the skull through the vertex without intravenous contrast. COMPARISON:  06/17/2016 FINDINGS: Brain: Stable old infarct involving the right occipital lobe. Stable small vessel ischemic disease in the periventricular white matter. The brain demonstrates no evidence of hemorrhage, acute infarction, edema, mass effect, extra-axial fluid collection, hydrocephalus or mass lesion. Vascular: No hyperdense vessel or unexpected calcification. Skull: Normal. Negative for fracture or focal lesion. Sinuses/Orbits: No acute finding. Other: None. IMPRESSION: No acute findings. Stable old infarct involving the right occipital lobe. Electronically Signed   By: Aletta Edouard  M.D.   On: 06/14/2017 17:07   ASSESSMENT AND PLAN:     1.  Non-STEMI with Coronary artery disease Off heparin drip. Status post successful complex angioplasty and 2 drug-eluting stent placement to the right coronary artery. Continue dual antiplatelet therapy with aspirin and Brilinta for a minimal of 1 year and preferably longer per Dr. Fletcher Anon.  2. Acute systolic heart failure due to ischemic cardiomyopathy  given 1 dose of IV furosemide yesterday. Increase Coreg to 6.25 mg bid and add losartan per Dr. Fletcher Anon. Ehco: LV EF: 55% -   60%  3.  Hyperlipidemia. LDL 146. Continue Lipitor  4.  Hypertension, increase Coreg to 6.25 mg bid and add losartan per Dr. Fletcher Anon.  Leukocytosis.  Possible due to reaction to cardiac cath yesterday.  Follow CBC. Hypokalemia.  Give potassium supplement and follow-up level. Anemia.  Unclear etiology, possible due to fluid dilation and heparin drip.  No active bleeding.  Follow-up Hb. FOTB.  All the records are reviewed and case discussed with Care Management/Social Worker. Management plans discussed with the patient, daughter and they are in agreement.  CODE STATUS: Full Code  TOTAL TIME TAKING CARE OF THIS PATIENT: 36 minutes.   More than 50% of the time was spent in counseling/coordination of care: YES  POSSIBLE D/C IN 2 DAYS, DEPENDING ON CLINICAL CONDITION.   Demetrios Loll M.D on 06/15/2017 at 12:31 PM  Between 7am to 6pm - Pager - [phone]  After 6pm go to [email or web address] - [facility]

## 2017-06-15 NOTE — Progress Notes (Signed)
Progress Note  Patient Name: Margaret [name] Date of Encounter: 06/15/2017  Primary Cardiologist: Dr. Martinique  Subjective   The patient had cardiac catheterization done yesterday which showed occluded right coronary artery due to very late stent thrombosis.  I performed successful complex angioplasty, aspiration thrombectomy and 2 overlapped drug-eluting stent placement.  The patient developed mental status change after the procedure with significant chest pain and shortness of breath which required relook angiography.  It showed patent RCA stents. CT head showed no acute findings other than an old stroke.  The patient was started on nitroglycerin drip for uncontrolled hypertension and was given 1 dose of IV furosemide.  She gradually improved overnight and she is back to baseline.  She is alert and oriented x3 this morning.  She denies chest pain or shortness of breath.  Inpatient Medications    Scheduled Meds: . amLODipine  5 mg Oral Daily  . aspirin EC  81 mg Oral Daily  . atorvastatin  80 mg Oral Daily  . carvedilol  6.25 mg Oral BID WC  . docusate sodium  100 mg Oral BID  . enoxaparin (LOVENOX) injection  40 mg Subcutaneous Q24H  . losartan  25 mg Oral Daily  . pantoprazole  40 mg Oral Daily  . pneumococcal 23 valent vaccine  0.5 mL Intramuscular Tomorrow-1000  . sertraline  25 mg Oral Daily  . sodium chloride flush  3 mL Intravenous Q12H  . ticagrelor  90 mg Oral BID   Continuous Infusions: . sodium chloride    . sodium chloride     PRN Meds: sodium chloride, sodium chloride, acetaminophen, bisacodyl, metoCLOPramide (REGLAN) injection, morphine injection, ondansetron (ZOFRAN) IV, senna, sodium chloride flush   Vital Signs    Vitals:   06/15/17 0445 06/15/17 0500 06/15/17 0600 06/15/17 0737  BP: 138/65 (!) 146/73 138/64 (!) 151/66  Pulse: 67 61 64 71  Resp: 16 14 (!) 9   Temp:    98.3 F (36.8 C)  TempSrc:    Oral  SpO2: 100% 98% 97%   Weight:      Height:         Intake/Output Summary (Last 24 hours) at 06/15/2017 1024 Last data filed at 06/15/2017 1021 Gross per 24 hour  Intake 108.98 ml  Output 1650 ml  Net -1541.02 ml   Filed Weights   06/14/17 0546 06/14/17 1012 06/14/17 1714  Weight: 192 lb (87.1 kg) 192 lb (87.1 kg) 193 lb 5.5 oz (87.7 kg)    Telemetry    Normal sinus rhythm with PVCs and intermittent sinus bradycardia- Personally Reviewed  ECG    Normal sinus rhythm with inferior infarct- Personally Reviewed  Physical Exam   GEN: No acute distress.   Neck: No JVD Cardiac: RRR, no murmurs, rubs, or gallops.  Respiratory: Clear to auscultation bilaterally. GI: Soft, nontender, non-distended  MS: No edema; No deformity. Neuro:  Nonfocal  Psych: Normal affect  Left radial pulse is normal with no hematoma.  Right groin is intact with no hematoma.  Labs    Chemistry Recent Labs  Lab 06/14/17 0556 06/15/17 0517  NA 138 138  K 3.8 3.1*  CL 107 105  CO2 25 25  GLUCOSE 178* 138*  BUN 19 24*  CREATININE 0.85 0.90  CALCIUM 8.9 8.6*  GFRNONAA >60 60*  GFRAA >60 >60  ANIONGAP 6 8     Hematology Recent Labs  Lab 06/14/17 0556 06/15/17 0517  WBC 10.3 14.0*  RBC 4.53 4.18  HGB 10.7* 9.8*  HCT 34.2* 31.0*  MCV 75.6* 74.3*  MCH 23.7* 23.6*  MCHC 31.4* 31.8*  RDW 15.5* 15.9*  PLT 345 329    Cardiac Enzymes Recent Labs  Lab 06/14/17 0556 06/14/17 0843 06/14/17 1725 06/14/17 2244  TROPONINI 7.58* 10.24* >65.00* >65.00*   No results for input(s): TROPIPOC in the last 168 hours.   BNPNo results for input(s): BNP, PROBNP in the last 168 hours.   DDimer No results for input(s): DDIMER in the last 168 hours.   Radiology    Dg Chest 2 View  Result Date: 06/14/2017 CLINICAL DATA:  Chest pain and shortness of breath EXAM: CHEST  2 VIEW COMPARISON:  June 17, 2016 FINDINGS: There is ill-defined opacity in the lateral left base with small left pleural effusion. Lungs elsewhere are clear. Heart is mildly  enlarged with pulmonary venous hypertension. No adenopathy. No evident bone lesions. IMPRESSION: Pulmonary vascular congestion. Ill-defined opacity lateral left base. Appearance consistent with atelectasis, although there may be early developing pneumonia lateral left base. Small left pleural effusion noted. Electronically Signed   By: Lowella Grip III M.D.   On: 06/14/2017 07:08   Ct Head Wo Contrast  Result Date: 06/14/2017 CLINICAL DATA:  Meds status changes after cardiac catheterization. EXAM: CT HEAD WITHOUT CONTRAST TECHNIQUE: Contiguous axial images were obtained from the base of the skull through the vertex without intravenous contrast. COMPARISON:  06/17/2016 FINDINGS: Brain: Stable old infarct involving the right occipital lobe. Stable small vessel ischemic disease in the periventricular white matter. The brain demonstrates no evidence of hemorrhage, acute infarction, edema, mass effect, extra-axial fluid collection, hydrocephalus or mass lesion. Vascular: No hyperdense vessel or unexpected calcification. Skull: Normal. Negative for fracture or focal lesion. Sinuses/Orbits: No acute finding. Other: None. IMPRESSION: No acute findings. Stable old infarct involving the right occipital lobe. Electronically Signed   By: Aletta Edouard M.D.   On: 06/14/2017 17:07    Cardiac Studies   Cardiac catheterization on December 24: 1.  Severe one-vessel coronary artery disease with very late stent thrombosis affecting the distal right coronary artery.  There is also borderline significant proximal to mid LAD stenosis at the origin of a diagonal branch and moderate left circumflex disease.  The coronary arteries are overall moderately calcified. 2.  Mildly reduced LV systolic function with an EF of 40% and inferior wall hypokinesis.  Mildly elevated left ventricular end-diastolic pressure. 3.  Successful complex angioplasty and 2 overlapped drug-eluting stent placement to the right coronary artery with  extensive balloon angioplasty and aspiration thrombectomy.  Patient Profile     76 y.o. female with history of coronary artery disease status post drug-eluting stent placement to the RCA in 2013, previous stroke, hypertension, hyperlipidemia and GERD who presented with chest pain and was found to have significantly elevated troponin with EKG changes suggestive of inferior infarct that did not meet criteria for a STEMI.  Assessment & Plan    1.  Inferior ST elevation myocardial infarction: Did not meet criteria for a STEMI but the patient was found to have an occluded distal right coronary artery which was the culprit and troponin increased to above 65.  She is status post successful complex angioplasty and 2 drug-eluting stent placement to the right coronary artery.  Relook angiography showed patent RCA stents. Continue dual antiplatelet therapy with aspirin and Brilinta for a minimal of 1 year and preferably longer. Continue treatment of risk factors. The patient was referred to cardiac rehab. Transfer to telemetry today.  2.  Acute systolic  heart failure due to ischemic cardiomyopathy: The patient was given 1 dose of IV furosemide yesterday with improvement in shortness of breath. I increase carvedilol to 6.25 mg twice daily and added losartan 25 mg once daily. Check echocardiogram today.  3.  Essential hypertension: Blood pressure is still uncontrolled.  I adjusted medications as outlined above.  4.  Mental status change post cardiac catheterization was likely due to sedation in the setting of prior stroke.  CT head showed no acute abnormalities.  5.  Hyperlipidemia: Continue high-dose atorvastatin.   For questions or updates, please contact [place] Please consult [email or web address] for contact info under Cardiology/STEMI.      Signed, Kathlyn Sacramento, MD  06/15/2017, 10:24 AM

## 2017-06-15 NOTE — Progress Notes (Addendum)
Pt Potassium was at 3.1. Doctor chen was notified and will place order for potassium 60 mEq. Will continue to monitor

## 2017-06-15 NOTE — Progress Notes (Signed)
Pt is refusing bed alarm but was educated on safety.

## 2017-06-15 NOTE — Plan of Care (Signed)
  Progressing Clinical Measurements: Ability to maintain clinical measurements within normal limits will improve 06/15/2017 1843 - Progressing by Liliane Channel, RN Cardiovascular complication will be avoided 06/15/2017 1843 - Progressing by Liliane Channel, RN Coping: Level of anxiety will decrease 06/15/2017 1843 - Progressing by Liliane Channel, RN Pain Managment: General experience of comfort will improve 06/15/2017 1843 - Progressing by Liliane Channel, RN Safety: Ability to remain free from injury will improve 06/15/2017 1843 - Progressing by Liliane Channel, RN

## 2017-06-16 ENCOUNTER — Encounter: Payer: Self-pay | Admitting: Cardiovascular Disease

## 2017-06-16 LAB — BASIC METABOLIC PANEL
ANION GAP: 8 (ref 5–15)
BUN: 28 mg/dL — ABNORMAL HIGH (ref 6–20)
CHLORIDE: 108 mmol/L (ref 101–111)
CO2: 23 mmol/L (ref 22–32)
Calcium: 8.5 mg/dL — ABNORMAL LOW (ref 8.9–10.3)
Creatinine, Ser: 0.84 mg/dL (ref 0.44–1.00)
Glucose, Bld: 123 mg/dL — ABNORMAL HIGH (ref 65–99)
POTASSIUM: 3.7 mmol/L (ref 3.5–5.1)
SODIUM: 139 mmol/L (ref 135–145)

## 2017-06-16 LAB — CBC
HCT: 32.1 % — ABNORMAL LOW (ref 35.0–47.0)
HEMOGLOBIN: 10 g/dL — AB (ref 12.0–16.0)
MCH: 23.3 pg — ABNORMAL LOW (ref 26.0–34.0)
MCHC: 31.3 g/dL — ABNORMAL LOW (ref 32.0–36.0)
MCV: 74.5 fL — ABNORMAL LOW (ref 80.0–100.0)
Platelets: 322 10*3/uL (ref 150–440)
RBC: 4.31 MIL/uL (ref 3.80–5.20)
RDW: 16.2 % — ABNORMAL HIGH (ref 11.5–14.5)
WBC: 10.6 10*3/uL (ref 3.6–11.0)

## 2017-06-16 MED ORDER — AMLODIPINE BESYLATE 10 MG PO TABS: 10.0000 mg | ORAL_TABLET | Freq: Every day | ORAL | Status: DC

## 2017-06-16 MED ORDER — TICAGRELOR 90 MG PO TABS: 90.0000 mg | ORAL_TABLET | Freq: Two times a day (BID) | ORAL | 1 refills | Status: DC

## 2017-06-16 MED ORDER — LOSARTAN POTASSIUM 50 MG PO TABS: 50.0000 mg | ORAL_TABLET | Freq: Every day | ORAL | Status: DC

## 2017-06-16 MED ORDER — NITROGLYCERIN 0.4 MG SL SUBL: 0.4000 mg | SUBLINGUAL_TABLET | SUBLINGUAL | Status: DC | PRN

## 2017-06-16 MED ORDER — NITROGLYCERIN 0.4 MG SL SUBL: 0.4000 mg | SUBLINGUAL_TABLET | SUBLINGUAL | 2 refills | Status: AC | PRN

## 2017-06-16 MED ORDER — LOSARTAN POTASSIUM 50 MG PO TABS: 50.0000 mg | ORAL_TABLET | Freq: Every day | ORAL | 1 refills | Status: DC

## 2017-06-16 MED ORDER — AMLODIPINE BESYLATE 5 MG PO TABS
5.0000 mg | ORAL_TABLET | Freq: Every day | ORAL | Status: DC
  Administered 2017-06-16: 5 mg via ORAL

## 2017-06-16 MED ORDER — CARVEDILOL 6.25 MG PO TABS: 6.2500 mg | ORAL_TABLET | Freq: Two times a day (BID) | ORAL | 1 refills | Status: DC

## 2017-06-16 NOTE — Plan of Care (Signed)
  Progressing Clinical Measurements: Ability to maintain clinical measurements within normal limits will improve 06/16/2017 1038 - Progressing by Liliane Channel, RN Respiratory complications will improve 06/16/2017 1038 - Progressing by Liliane Channel, RN Cardiovascular complication will be avoided 06/16/2017 1038 - Progressing by Liliane Channel, RN Coping: Level of anxiety will decrease 06/16/2017 1038 - Progressing by Liliane Channel, RN Pain Managment: General experience of comfort will improve 06/16/2017 1038 - Progressing by Liliane Channel, RN Safety: Ability to remain free from injury will improve 06/16/2017 1038 - Progressing by Liliane Channel, RN

## 2017-06-16 NOTE — Discharge Summary (Signed)
[facility] at [place] NAME: Margaret [name]    MR#:  [id number]  DATE OF BIRTH:  09-13-1940  DATE OF ADMISSION:  06/14/2017   ADMITTING PHYSICIAN: Saundra Shelling, MD  DATE OF DISCHARGE: 06/16/2017 PRIMARY CARE PHYSICIAN: Lynnell Jude, MD   ADMISSION DIAGNOSIS:  NSTEMI (non-ST elevated myocardial infarction) ([place]) [I21.4] DISCHARGE DIAGNOSIS:  Active Problems:   NSTEMI (non-ST elevated myocardial infarction) ([place])  SECONDARY DIAGNOSIS:   Past Medical History:  Diagnosis Date  . Anxiety and depression   . CAD (coronary artery disease)   . Cellulitis of right leg 2005  . Colon polyp   . Dyspnea on exertion   . GERD (gastroesophageal reflux disease)   . Hyperlipidemia   . Hypertension   . Morbid obesity ([place])   . Presence of stent in right coronary artery 02/08/12   2.5x18 Xience distal RCA  . Right shoulder pain   . Stroke ([place])   . Vaginal prolapse    HOSPITAL COURSE:   1.Non-STEMI with Coronary artery disease Off heparin drip. Status post successful complex angioplasty and 2 drug-eluting stent placement to the right coronary artery. Continue dual antiplatelet therapy with aspirin and Brilinta for a minimal of 1 year and preferably longer per Dr. Fletcher Anon.  2.Acute systolic heart failure due to ischemic cardiomyopathy given 1 dose of IV furosemide yesterday. Increased Coreg to 6.25 mg bid and increased losartan per Dr. Fletcher Anon. Ehco: LV EF: 55% - 60%  3. Hyperlipidemia. LDL 146. Continue Lipitor  4.Hypertension, increased Coreg to 6.25 mg bid and losartan to 50 mg daily per Dr. Fletcher Anon.  Leukocytosis.  Possible due to reaction to cardiac cath yesterday.  Follow CBC. Hypokalemia.  Give potassium supplement and follow-up level. Anemia.  Unclear etiology, possible due to fluid dilation and heparin drip.  No active bleeding.Hb is stable. Follow-up PCP for workup.  DISCHARGE CONDITIONS:  Stable, discharge  today. CONSULTS OBTAINED:  Treatment Team:  Wellington Hampshire, MD DRUG ALLERGIES:   Allergies  Allergen Reactions  . Erythromycin Nausea Only  . Other Rash    Micropore tape  . Tape Itching and Rash    Also "band-aids"   DISCHARGE MEDICATIONS:   Allergies as of 06/16/2017      Reactions   Erythromycin Nausea Only   Other Rash   Micropore tape   Tape Itching, Rash   Also "band-aids"      Medication List    STOP taking these medications   clopidogrel 75 MG tablet Commonly known as:  PLAVIX   hydrochlorothiazide 12.5 MG capsule Commonly known as:  MICROZIDE   hydrochlorothiazide 25 MG tablet Commonly known as:  HYDRODIURIL   metoprolol tartrate 25 MG tablet Commonly known as:  LOPRESSOR   omeprazole 20 MG capsule Commonly known as:  PRILOSEC     TAKE these medications   amLODipine 5 MG tablet Commonly known as:  NORVASC Take 1 tablet (5 mg total) by mouth daily.   aspirin EC 81 MG tablet Take 1 tablet (81 mg total) by mouth daily.   atorvastatin 80 MG tablet Commonly known as:  LIPITOR Take 80 mg by mouth daily.   carvedilol 6.25 MG tablet Commonly known as:  COREG Take 1 tablet (6.25 mg total) by mouth 2 (two) times daily with a meal.   losartan 50 MG tablet Commonly known as:  COZAAR Take 1 tablet (50 mg total) by mouth daily. Start taking on:  06/17/2017   nitroGLYCERIN 0.4 MG SL tablet  Commonly known as:  NITROSTAT Place 1 tablet (0.4 mg total) under the tongue every 5 (five) minutes as needed for chest pain.   pantoprazole 40 MG tablet Commonly known as:  PROTONIX Take 40 mg by mouth daily.   sertraline 25 MG tablet Commonly known as:  ZOLOFT Take 25 mg by mouth daily.   STOOL SOFTENER PO Take by mouth. As needed   ticagrelor 90 MG Tabs tablet Commonly known as:  BRILINTA Take 1 tablet (90 mg total) by mouth 2 (two) times daily.        DISCHARGE INSTRUCTIONS:  See AVS.  If you experience worsening of your admission symptoms,  develop shortness of breath, life threatening emergency, suicidal or homicidal thoughts you must seek medical attention immediately by calling 911 or calling your MD immediately  if symptoms less severe.  You Must read complete instructions/literature along with all the possible adverse reactions/side effects for all the Medicines you take and that have been prescribed to you. Take any new Medicines after you have completely understood and accpet all the possible adverse reactions/side effects.   Please note  You were cared for by a hospitalist during your hospital stay. If you have any questions about your discharge medications or the care you received while you were in the hospital after you are discharged, you can call the unit and asked to speak with the hospitalist on call if the hospitalist that took care of you is not available. Once you are discharged, your primary care physician will handle any further medical issues. Please note that NO REFILLS for any discharge medications will be authorized once you are discharged, as it is imperative that you return to your primary care physician (or establish a relationship with a primary care physician if you do not have one) for your aftercare needs so that they can reassess your need for medications and monitor your lab values.    On the day of Discharge:  VITAL SIGNS:  Blood pressure 120/64, pulse (!) 57, temperature 98.2 F (36.8 C), temperature source Oral, resp. rate 18, height 5\' 8"  (1.727 m), weight 193 lb 5.5 oz (87.7 kg), SpO2 99 %. PHYSICAL EXAMINATION:  GENERAL:  76 y.o.-year-old patient lying in the bed with no acute distress.  EYES: Pupils equal, round, reactive to light and accommodation. No scleral icterus. Extraocular muscles intact.  HEENT: Head atraumatic, normocephalic. Oropharynx and nasopharynx clear.  NECK:  Supple, no jugular venous distention. No thyroid enlargement, no tenderness.  LUNGS: Normal breath sounds bilaterally,  no wheezing, rales,rhonchi or crepitation. No use of accessory muscles of respiration.  CARDIOVASCULAR: S1, S2 normal. No murmurs, rubs, or gallops.  ABDOMEN: Soft, non-tender, non-distended. Bowel sounds present. No organomegaly or mass.  EXTREMITIES: No pedal edema, cyanosis, or clubbing.  NEUROLOGIC: Cranial nerves II through XII are intact. Muscle strength 5/5 in all extremities. Sensation intact. Gait not checked.  PSYCHIATRIC: The patient is alert and oriented x 3.  SKIN: No obvious rash, lesion, or ulcer.  DATA REVIEW:   CBC Recent Labs  Lab 06/16/17 0520  WBC 10.6  HGB 10.0*  HCT 32.1*  PLT 322    Chemistries  Recent Labs  Lab 06/15/17 0517 06/16/17 0520  NA 138 139  K 3.1* 3.7  CL 105 108  CO2 25 23  GLUCOSE 138* 123*  BUN 24* 28*  CREATININE 0.90 0.84  CALCIUM 8.6* 8.5*  MG 2.0  --      Microbiology Results  Results for orders placed or performed  during the hospital encounter of 06/14/17  MRSA PCR Screening     Status: None   Collection Time: 06/14/17  5:14 PM  Result Value Ref Range Status   MRSA by PCR NEGATIVE NEGATIVE Final    Comment:        The GeneXpert MRSA Assay (FDA approved for NASAL specimens only), is one component of a comprehensive MRSA colonization surveillance program. It is not intended to diagnose MRSA infection nor to guide or monitor treatment for MRSA infections. Performed at [facility], [address] [place],  [address]     RADIOLOGY:  No results found.   Management plans discussed with the patient, her daughter and they are in agreement.  CODE STATUS: Full Code   TOTAL TIME TAKING CARE OF THIS PATIENT: 35 minutes.    Demetrios Loll M.D on 06/16/2017 at 1:00 PM  Between 7am to 6pm - Pager - [phone]  After 6pm go to [email or web address] - Proofreader  [facility]  Office  [phone]  CC: Primary care physician; Lynnell Jude, MD   Note: This dictation was  prepared with Dragon dictation along with smaller phrase technology. Any transcriptional errors that result from this process are unintentional.

## 2017-06-16 NOTE — Care Management (Signed)
Patient to discharge on Brilinta. Patient verbally  confirmed pharmacy coverage with insurance.  Provided with coupon for 30 day trial for Brilinta

## 2017-06-16 NOTE — Discharge Instructions (Signed)
Heart healthy diet

## 2017-06-16 NOTE — Progress Notes (Signed)
Pt d/c to home today.  IV removed intact.  D/c paperwork printed and reviewed w/pt.  All medication questions and concerns reviewed and pt states understanding.  All Rx's given to patient. Pt requested to wheelchaired out for discharge for d/c.

## 2017-06-16 NOTE — Progress Notes (Signed)
Progress Note  Patient Name: Margaret [name] Date of Encounter: 06/16/2017  Primary Cardiologist: [place]  Subjective   No further chest pain or SOB. Ambulating without issues. Tolerating medications. Labs stable. Echo showed EF 55-60% as detailed below.   Inpatient Medications    Scheduled Meds: . amLODipine  5 mg Oral Daily  . aspirin EC  81 mg Oral Daily  . atorvastatin  80 mg Oral Daily  . carvedilol  6.25 mg Oral BID WC  . docusate sodium  100 mg Oral BID  . enoxaparin (LOVENOX) injection  40 mg Subcutaneous Q24H  . [START ON 06/17/2017] losartan  50 mg Oral Daily  . pantoprazole  40 mg Oral Daily  . pneumococcal 23 valent vaccine  0.5 mL Intramuscular Tomorrow-1000  . sertraline  25 mg Oral Daily  . sodium chloride flush  3 mL Intravenous Q12H  . ticagrelor  90 mg Oral BID   Continuous Infusions: . sodium chloride    . sodium chloride     PRN Meds: sodium chloride, sodium chloride, acetaminophen, bisacodyl, metoCLOPramide (REGLAN) injection, morphine injection, ondansetron (ZOFRAN) IV, senna, sodium chloride flush   Vital Signs    Vitals:   06/15/17 1807 06/15/17 1930 06/16/17 0343 06/16/17 0803  BP: (!) 149/52 131/71 (!) 138/58 (!) 168/80  Pulse: 66 65 65 62  Resp: 20 19 19 18   Temp: 98.3 F (36.8 C) 98 F (36.7 C) 98.2 F (36.8 C) 98.2 F (36.8 C)  TempSrc: Oral Oral Oral Oral  SpO2: 97% 99% 96% 99%  Weight:      Height:        Intake/Output Summary (Last 24 hours) at 06/16/2017 0846 Last data filed at 06/15/2017 2300 Gross per 24 hour  Intake 5.88 ml  Output 141 ml  Net -135.12 ml   Filed Weights   06/14/17 0546 06/14/17 1012 06/14/17 1714  Weight: 192 lb (87.1 kg) 192 lb (87.1 kg) 193 lb 5.5 oz (87.7 kg)    Telemetry    NSR, occasional PVCs - Personally Reviewed  ECG    n/a - Personally Reviewed  Physical Exam   GEN: No acute distress.   Neck: No JVD. Cardiac: RRR, no murmurs, rubs, or gallops. Radial cath site without active  bleeding, swelling, erythema, or TTP. Radial pulse 2+. Femoral cath site without bleeding, bruising, swelling, erythema, or TTP. No bruit.  Respiratory: Clear to auscultation bilaterally.  GI: Soft, nontender, non-distended.   MS: No edema; No deformity. Neuro:  Alert and oriented x 3; Nonfocal.  Psych: Normal affect.  Labs    Chemistry Recent Labs  Lab 06/14/17 0556 06/15/17 0517 06/16/17 0520  NA 138 138 139  K 3.8 3.1* 3.7  CL 107 105 108  CO2 25 25 23   GLUCOSE 178* 138* 123*  BUN 19 24* 28*  CREATININE 0.85 0.90 0.84  CALCIUM 8.9 8.6* 8.5*  GFRNONAA >60 60* >60  GFRAA >60 >60 >60  ANIONGAP 6 8 8      Hematology Recent Labs  Lab 06/14/17 0556 06/15/17 0517 06/16/17 0520  WBC 10.3 14.0* 10.6  RBC 4.53 4.18 4.31  HGB 10.7* 9.8* 10.0*  HCT 34.2* 31.0* 32.1*  MCV 75.6* 74.3* 74.5*  MCH 23.7* 23.6* 23.3*  MCHC 31.4* 31.8* 31.3*  RDW 15.5* 15.9* 16.2*  PLT 345 329 322    Cardiac Enzymes Recent Labs  Lab 06/14/17 0556 06/14/17 0843 06/14/17 1725 06/14/17 2244  TROPONINI 7.58* 10.24* >65.00* >65.00*   No results for input(s): TROPIPOC in the last  168 hours.   BNPNo results for input(s): BNP, PROBNP in the last 168 hours.   DDimer No results for input(s): DDIMER in the last 168 hours.   Radiology    Ct Head Wo Contrast  Result Date: 06/14/2017 IMPRESSION: No acute findings. Stable old infarct involving the right occipital lobe. Electronically Signed   By: Aletta Edouard M.D.   On: 06/14/2017 17:07    Cardiac Studies   Cardiac catheterization on 06/14/17: 1. Severe one-vessel coronary artery disease with very late stent thrombosis affecting the distal right coronary artery. There is also borderline significant proximal to mid LAD stenosis at the origin of a diagonal branch and moderate left circumflex disease. The coronary arteries are overall moderately calcified. 2. Mildly reduced LV systolic function with an EF of 40% and inferior wall  hypokinesis. Mildly elevated left ventricular end-diastolic pressure. 3. Successful complex angioplasty and 2 overlapped drug-eluting stent placement to the right coronary artery with extensive balloon angioplasty and aspiration thrombectomy.  Echo 06/15/17: Study Conclusions  - Left ventricle: The cavity size was normal. There was mild   concentric hypertrophy. Systolic function was normal. The   estimated ejection fraction was in the range of 55% to 60%. Mild   hypokinesis of the inferior myocardium. Features are consistent   with a pseudonormal left ventricular filling pattern, with   concomitant abnormal relaxation and increased filling pressure   (grade 2 diastolic dysfunction). - Aortic valve: There was trivial regurgitation. - Mitral valve: There was mild regurgitation. - Left atrium: The atrium was moderately dilated. - Right atrium: The atrium was mildly dilated. - Pericardium, extracardiac: A trivial pericardial effusion was   identified posterior to the heart.   Patient Profile     76 y.o. female with history of coronary artery disease status post drug-eluting stent placement to the RCA in 2013, previous stroke, hypertension, hyperlipidemia and GERD who presented with chest pain and was found to have significantly elevated troponin with EKG changes suggestive of inferior infarct that did not meet criteria for a STEMI.  Assessment & Plan    1. Inferior ST elevation MI: -Did not meet criteria for STEMI, though patient was found to have an occluded distal RCA which was the culprit lesion -Troponin increased to > 65 -Status post successful PCI/DES x 2 to the RCA -Re-look angiography showed patent RCA stents -DAPT with ASA and Brilinta for a minimum of 12 months without interruption, preferably longer -Aggressive risk factor modification -Cardiac rehab -Coreg, losartan, SL NTG (IM please send in a prescription for SL NTG)  2. Acute on chronic diastolic CHF: -Echo as  above  3. HTN: -Remains elevated in the 884Z systolic currently  -Losartan has been increased to 50 mg daily by IM -Coreg -Norvasc added by IM  4. AMS: -Status post LHC -CT head without acute abnormalities  -Felt to be 2/2 sedation in the setting of prior stroke  5. HLD: -Lipitor -LDL of 146, goal < 70 -Needs outpatient follow up -If remains > 70, consider PCSK9 inhibitor   For questions or updates, please contact [facility] Please consult [email or web address] for contact info under Cardiology/STEMI.    Signed, Christell Faith, PA-C [place] Pager: [phone] 06/16/2017, 8:46 AM

## 2017-06-16 NOTE — Care Management Important Message (Signed)
Important Message  Patient Details  Name: Margaret [name] MRN: [id number] Date of Birth: [birth year]   Medicare Important Message Given:  N/A - LOS <3 / Initial given by admissions    Katrina Stack, RN 06/16/2017, 9:41 AM

## 2017-06-23 ENCOUNTER — Other Ambulatory Visit: Payer: Self-pay

## 2017-06-23 NOTE — Patient Outreach (Signed)
[facility]) Care Management  06/23/2017  Margaret [name] 07/29/1940 [id number]   EMMI- General Discharge RED ON EMMI ALERT Day # 4 Date: 06-22-17 Red Alert Reason: Lost interest in things? Yes Sad/hopeless/anxious/ empty? Yes  Spoke with patient she is able to verify HIPAA.  Patient reports that she is doing ok but she reports that her life right now is not what she wanted but states it is reality and she will get through it.  Patient really denies being depressed but just dealing with life.  Patient reports she has been in contact with her primary doctor and will be making an appointment since everyone is back open today.  She reports she is taking her medications as ordered and that her daughter fills her pill box for her.    Discussed with patient [place].  Patient declines services at this time but agreeable to receive letter and brochure for future reference.    Plan: RN CM will send letter and brochure. RN CM will close case and notify care management assistant of case status.     Margaret Baseman, RN, MSN [facility] Care Management Care Management Coordinator Direct Line [phone] Toll Free: [phone]  Fax: [phone]

## 2017-07-05 DIAGNOSIS — F321 Major depressive disorder, single episode, moderate: Secondary | ICD-10-CM | POA: Diagnosis not present

## 2017-07-05 DIAGNOSIS — I214 Non-ST elevation (NSTEMI) myocardial infarction: Secondary | ICD-10-CM | POA: Diagnosis not present

## 2017-07-05 DIAGNOSIS — Z6827 Body mass index (BMI) 27.0-27.9, adult: Secondary | ICD-10-CM | POA: Diagnosis not present

## 2017-07-05 DIAGNOSIS — I10 Essential (primary) hypertension: Secondary | ICD-10-CM | POA: Diagnosis not present

## 2017-07-08 ENCOUNTER — Encounter: Payer: Self-pay | Admitting: *Deleted

## 2017-07-08 ENCOUNTER — Encounter: Payer: PPO | Attending: Cardiovascular Disease | Admitting: *Deleted

## 2017-07-08 VITALS — Ht 66.0 in | Wt 185.6 lb

## 2017-07-08 DIAGNOSIS — F329 Major depressive disorder, single episode, unspecified: Secondary | ICD-10-CM | POA: Diagnosis not present

## 2017-07-08 DIAGNOSIS — Z955 Presence of coronary angioplasty implant and graft: Secondary | ICD-10-CM | POA: Diagnosis not present

## 2017-07-08 DIAGNOSIS — Z87891 Personal history of nicotine dependence: Secondary | ICD-10-CM | POA: Diagnosis not present

## 2017-07-08 DIAGNOSIS — K219 Gastro-esophageal reflux disease without esophagitis: Secondary | ICD-10-CM | POA: Insufficient documentation

## 2017-07-08 DIAGNOSIS — I10 Essential (primary) hypertension: Secondary | ICD-10-CM | POA: Diagnosis not present

## 2017-07-08 DIAGNOSIS — Z8673 Personal history of transient ischemic attack (TIA), and cerebral infarction without residual deficits: Secondary | ICD-10-CM | POA: Insufficient documentation

## 2017-07-08 DIAGNOSIS — Z86010 Personal history of colonic polyps: Secondary | ICD-10-CM | POA: Insufficient documentation

## 2017-07-08 DIAGNOSIS — Z7902 Long term (current) use of antithrombotics/antiplatelets: Secondary | ICD-10-CM | POA: Insufficient documentation

## 2017-07-08 DIAGNOSIS — I252 Old myocardial infarction: Secondary | ICD-10-CM | POA: Insufficient documentation

## 2017-07-08 DIAGNOSIS — E785 Hyperlipidemia, unspecified: Secondary | ICD-10-CM | POA: Insufficient documentation

## 2017-07-08 DIAGNOSIS — Z7982 Long term (current) use of aspirin: Secondary | ICD-10-CM | POA: Insufficient documentation

## 2017-07-08 DIAGNOSIS — I214 Non-ST elevation (NSTEMI) myocardial infarction: Secondary | ICD-10-CM

## 2017-07-08 DIAGNOSIS — Z79899 Other long term (current) drug therapy: Secondary | ICD-10-CM | POA: Insufficient documentation

## 2017-07-08 DIAGNOSIS — I2510 Atherosclerotic heart disease of native coronary artery without angina pectoris: Secondary | ICD-10-CM | POA: Insufficient documentation

## 2017-07-08 DIAGNOSIS — F419 Anxiety disorder, unspecified: Secondary | ICD-10-CM | POA: Insufficient documentation

## 2017-07-08 NOTE — Progress Notes (Signed)
Daily Session Note  Patient Details  Name: Margaret [name] MRN: [id number] Date of Birth: 1941-02-13 Referring Provider:     Cardiac Rehab from 07/08/2017 in [facility]  Referring Provider  [place]      Encounter Date: 07/08/2017  Check In: Session Check In - 07/08/17 1221      Check-In   Location  [facility]-Cardiac & Pulmonary Rehab    Staff Present  Renita Papa, RN BSN;Susanne Bice, RN, BSN, Lance Sell, BA, ACSM CEP, Exercise Physiologist    Supervising physician immediately available to respond to emergencies  See telemetry face sheet for immediately available ER MD    Medication changes reported      No    Fall or balance concerns reported     No    Warm-up and Cool-down  Performed as group-led instruction    Resistance Training Performed  Yes    VAD Patient?  No      Pain Assessment   Currently in Pain?  No/denies        Exercise Prescription Changes - 07/08/17 1500      Response to Exercise   Blood Pressure (Admit)  128/62    Blood Pressure (Exercise)  160/64    Blood Pressure (Exit)  128/58    Heart Rate (Admit)  68 bpm    Heart Rate (Exercise)  102 bpm    Heart Rate (Exit)  60 bpm    Oxygen Saturation (Admit)  100 %    Rating of Perceived Exertion (Exercise)  15       Social History   Tobacco Use  Smoking Status Former Smoker  . Types: Cigarettes  . Last attempt to quit: 06/22/1984  . Years since quitting: 33.0  Smokeless Tobacco Never Used    Goals Met:  Proper associated with RPD/PD & O2 Sat Exercise tolerated well Personal goals reviewed No report of cardiac concerns or symptoms Strength training completed today  Goals Unmet:  Not Applicable  Comments: Med Review completed   Dr. Emily Filbert is Medical Director for [facility].

## 2017-07-08 NOTE — Progress Notes (Signed)
Cardiac Individual Treatment Plan  Patient Details  Name: Margaret [name] MRN: [id number] Date of Birth: 1941-04-29 Referring Provider:     Cardiac Rehab from 07/08/2017 in [facility] Cardiac and Pulmonary Rehab  Referring Provider  Arida      Initial Encounter Date:    Cardiac Rehab from 07/08/2017 in [facility] Cardiac and Pulmonary Rehab  Date  07/08/17  Referring Provider  Fletcher Anon      Visit Diagnosis: NSTEMI (non-ST elevated myocardial infarction) [facility])  Status post coronary artery stent placement  Patient's Home Medications on Admission:  Current Outpatient Medications:  .  amLODipine (NORVASC) 5 MG tablet, Take 1 tablet (5 mg total) by mouth daily., Disp: 90 tablet, Rfl: 3 .  aspirin EC 81 MG tablet, Take 1 tablet (81 mg total) by mouth daily., Disp: 90 tablet, Rfl: 3 .  atorvastatin (LIPITOR) 80 MG tablet, Take 80 mg by mouth daily., Disp: , Rfl:  .  carvedilol (COREG) 6.25 MG tablet, Take 1 tablet (6.25 mg total) by mouth 2 (two) times daily with a meal., Disp: 60 tablet, Rfl: 1 .  Docusate Calcium (STOOL SOFTENER PO), Take by mouth. As needed, Disp: , Rfl:  .  losartan (COZAAR) 50 MG tablet, Take 1 tablet (50 mg total) by mouth daily., Disp: 30 tablet, Rfl: 1 .  metoprolol tartrate (LOPRESSOR) 25 MG tablet, , Disp: , Rfl: 0 .  nitroGLYCERIN (NITROSTAT) 0.4 MG SL tablet, Place 1 tablet (0.4 mg total) under the tongue every 5 (five) minutes as needed for chest pain., Disp: 30 tablet, Rfl: 2 .  pantoprazole (PROTONIX) 40 MG tablet, Take 40 mg by mouth daily., Disp: , Rfl:  .  sertraline (ZOLOFT) 25 MG tablet, Take 25 mg by mouth daily., Disp: , Rfl:  .  ticagrelor (BRILINTA) 90 MG TABS tablet, Take 1 tablet (90 mg total) by mouth 2 (two) times daily., Disp: 60 tablet, Rfl: 1 .  clopidogrel (PLAVIX) 75 MG tablet, , Disp: , Rfl: 0  Past Medical History: Past Medical History:  Diagnosis Date  . Anxiety and depression   . CAD (coronary artery disease)   . Cellulitis of right leg 2005  .  Colon polyp   . Dyspnea on exertion   . GERD (gastroesophageal reflux disease)   . Hyperlipidemia   . Hypertension   . Morbid obesity ([place])   . Presence of stent in right coronary artery 02/08/12   2.5x18 Xience distal RCA  . Right shoulder pain   . Stroke ([place])   . Vaginal prolapse     Tobacco Use: Social History   Tobacco Use  Smoking Status Former Smoker  . Types: Cigarettes  . Last attempt to quit: 06/22/1984  . Years since quitting: 33.0  Smokeless Tobacco Never Used    Labs: Recent Review Scientist, physiological    Labs for ITP Cardiac and Pulmonary Rehab Latest Ref Rng & Units 06/15/2017   Cholestrol 0 - 200 mg/dL 225(H)   LDLCALC 0 - 99 mg/dL 146(H)   HDL >40 mg/dL 60   Trlycerides <150 mg/dL 96       Exercise Target Goals: Date: 07/08/17  Exercise Program Goal: Individual exercise prescription set using results from initial 6 min walk test and THRR while considering  patient's activity barriers and safety.   Exercise Prescription Goal: Initial exercise prescription builds to 30-45 minutes a day of aerobic activity, 2-3 days per week.  Home exercise guidelines will be given to patient during program as part of exercise prescription that the participant will  acknowledge.  Activity Barriers & Risk Stratification: Activity Barriers & Cardiac Risk Stratification - 07/08/17 1502      Activity Barriers & Cardiac Risk Stratification   Activity Barriers  Joint Problems left knee pain, receives injections for    Cardiac Risk Stratification  Moderate       6 Minute Walk: 6 Minute Walk    Row Name 07/08/17 1522         6 Minute Walk   Distance  1150 feet     Walk Time  6 minutes     # of Rest Breaks  0     MPH  2.18     METS  2.28     RPE  15     Perceived Dyspnea   0     VO2 Peak  7.99     Symptoms  No     Resting HR  55 bpm     Resting BP  128/62     Resting Oxygen Saturation   100 %     Exercise Oxygen Saturation  during 6 min walk  100 %     Max Ex. HR   102 bpm     Max Ex. BP  160/64     2 Minute Post BP  128/58        Oxygen Initial Assessment:   Oxygen Re-Evaluation:   Oxygen Discharge (Final Oxygen Re-Evaluation):   Initial Exercise Prescription: Initial Exercise Prescription - 07/08/17 1500      Date of Initial Exercise RX and Referring Provider   Date  07/08/17    Referring Provider  Arida      Treadmill   MPH  2    Grade  0    Minutes  15    METs  2.5      Recumbant Bike   Level  1    RPM  60    Watts  8    Minutes  15    METs  2.2      NuStep   Level  2    SPM  80    Minutes  15    METs  2.2      Prescription Details   Frequency (times per week)  3    Duration  Progress to 45 minutes of aerobic exercise without signs/symptoms of physical distress      Intensity   THRR 40-80% of Max Heartrate  89-125    Ratings of Perceived Exertion  11-13    Perceived Dyspnea  0-4      Resistance Training   Training Prescription  Yes    Weight  3 lb    Reps  10-15       Perform Capillary Blood Glucose checks as needed.  Exercise Prescription Changes: Exercise Prescription Changes    Row Name 07/08/17 1500             Response to Exercise   Blood Pressure (Admit)  128/62       Blood Pressure (Exercise)  160/64       Blood Pressure (Exit)  128/58       Heart Rate (Admit)  68 bpm       Heart Rate (Exercise)  102 bpm       Heart Rate (Exit)  60 bpm       Oxygen Saturation (Admit)  100 %       Rating of Perceived Exertion (Exercise)  15  Exercise Comments:   Exercise Goals and Review: Exercise Goals    Row Name 07/08/17 1522             Exercise Goals   Increase Physical Activity  Yes       Intervention  Provide advice, education, support and counseling about physical activity/exercise needs.;Develop an individualized exercise prescription for aerobic and resistive training based on initial evaluation findings, risk stratification, comorbidities and participant's personal goals.        Expected Outcomes  Short Term: Attend rehab on a regular basis to increase amount of physical activity.;Long Term: Add in home exercise to make exercise part of routine and to increase amount of physical activity.;Long Term: Exercising regularly at least 3-5 days a week.       Increase Strength and Stamina  Yes       Intervention  Provide advice, education, support and counseling about physical activity/exercise needs.;Develop an individualized exercise prescription for aerobic and resistive training based on initial evaluation findings, risk stratification, comorbidities and participant's personal goals.       Expected Outcomes  Short Term: Increase workloads from initial exercise prescription for resistance, speed, and METs.;Short Term: Perform resistance training exercises routinely during rehab and add in resistance training at home;Long Term: Improve cardiorespiratory fitness, muscular endurance and strength as measured by increased METs and functional capacity (6MWT)       Able to understand and use rate of perceived exertion (RPE) scale  Yes       Intervention  Provide education and explanation on how to use RPE scale       Expected Outcomes  Short Term: Able to use RPE daily in rehab to express subjective intensity level;Long Term:  Able to use RPE to guide intensity level when exercising independently       Able to understand and use Dyspnea scale  Yes       Intervention  Provide education and explanation on how to use Dyspnea scale       Expected Outcomes  Short Term: Able to use Dyspnea scale daily in rehab to express subjective sense of shortness of breath during exertion;Long Term: Able to use Dyspnea scale to guide intensity level when exercising independently       Knowledge and understanding of Target Heart Rate Range (THRR)  Yes       Intervention  Provide education and explanation of THRR including how the numbers were predicted and where they are located for reference        Expected Outcomes  Short Term: Able to state/look up THRR;Long Term: Able to use THRR to govern intensity when exercising independently;Short Term: Able to use daily as guideline for intensity in rehab       Able to check pulse independently  Yes       Intervention  Provide education and demonstration on how to check pulse in carotid and radial arteries.;Review the importance of being able to check your own pulse for safety during independent exercise       Expected Outcomes  Short Term: Able to explain why pulse checking is important during independent exercise;Long Term: Able to check pulse independently and accurately       Understanding of Exercise Prescription  Yes       Intervention  Provide education, explanation, and written materials on patient's individual exercise prescription       Expected Outcomes  Short Term: Able to explain program exercise prescription;Long Term: Able to explain home exercise prescription to  exercise independently          Exercise Goals Re-Evaluation :   Discharge Exercise Prescription (Final Exercise Prescription Changes): Exercise Prescription Changes - 07/08/17 1500      Response to Exercise   Blood Pressure (Admit)  128/62    Blood Pressure (Exercise)  160/64    Blood Pressure (Exit)  128/58    Heart Rate (Admit)  68 bpm    Heart Rate (Exercise)  102 bpm    Heart Rate (Exit)  60 bpm    Oxygen Saturation (Admit)  100 %    Rating of Perceived Exertion (Exercise)  15       Nutrition:  Target Goals: Understanding of nutrition guidelines, daily intake of sodium 1500mg , cholesterol 200mg , calories 30% from fat and 7% or less from saturated fats, daily to have 5 or more servings of fruits and vegetables.  Biometrics: Pre Biometrics - 07/08/17 1521      Pre Biometrics   Height  5\' 6"  (1.676 m)    Weight  185 lb 9.6 oz (84.2 kg)    Waist Circumference  37 inches    Hip Circumference  47 inches    Waist to Hip Ratio  0.79 %    BMI (Calculated)   29.97    Single Leg Stand  4.93 seconds        Nutrition Therapy Plan and Nutrition Goals:   Nutrition Assessments: Nutrition Assessments - 07/08/17 1457      MEDFICTS Scores   Pre Score  16       Nutrition Goals Re-Evaluation:   Nutrition Goals Discharge (Final Nutrition Goals Re-Evaluation):   Psychosocial: Target Goals: Acknowledge presence or absence of significant depression and/or stress, maximize coping skills, provide positive support system. Participant is able to verbalize types and ability to use techniques and skills needed for reducing stress and depression.   Initial Review & Psychosocial Screening: Initial Psych Review & Screening - 07/08/17 1457      Initial Review   Current issues with  Current Depression;Current Stress Concerns    Source of Stress Concerns  Unable to participate in former interests or hobbies;Financial    Comments  She and her husband are under financial stress related to their health problems. Her zoloft was recently increased. She said her depression currently is steming from "what could of been"      [place]?  Yes      Screening Interventions   Interventions  Encouraged to exercise;Program counselor consult;To provide support and resources with identified psychosocial needs    Expected Outcomes  Short Term goal: Utilizing psychosocial counselor, staff and physician to assist with identification of specific Stressors or current issues interfering with healing process. Setting desired goal for each stressor or current issue identified.;Long Term Goal: Stressors or current issues are controlled or eliminated.;Short Term goal: Identification and review with participant of any Quality of Life or Depression concerns found by scoring the questionnaire.;Long Term goal: The participant improves quality of Life and PHQ9 Scores as seen by post scores and/or verbalization of changes       Quality of Life Scores:   Quality of Life - 07/08/17 1459      Quality of Life Scores   Health/Function Pre  20.4 %    Socioeconomic Pre  20.25 %    Psych/Spiritual Pre  20 %    Family Pre  20.8 %    GLOBAL Pre  20.34 %  Scores of 19 and below usually indicate a poorer quality of life in these areas.  A difference of  2-3 points is a clinically meaningful difference.  A difference of 2-3 points in the total score of the Quality of Life Index has been associated with significant improvement in overall quality of life, self-image, physical symptoms, and general health in studies assessing change in quality of life.  PHQ-9: Recent Review Flowsheet Data    Depression screen [facility] 2/9 07/08/2017   Decreased Interest 2   Down, Depressed, Hopeless 1   PHQ - 2 Score 3   Altered sleeping 1   Tired, decreased energy 1   Change in appetite 0   Feeling bad or failure about yourself  3   Trouble concentrating 0   Moving slowly or fidgety/restless 0   Suicidal thoughts 0   PHQ-9 Score 8   Difficult doing work/chores Somewhat difficult     Interpretation of Total Score  Total Score Depression Severity:  1-4 = Minimal depression, 5-9 = Mild depression, 10-14 = Moderate depression, 15-19 = Moderately severe depression, 20-27 = Severe depression   Psychosocial Evaluation and Intervention:   Psychosocial Re-Evaluation:   Psychosocial Discharge (Final Psychosocial Re-Evaluation):   Vocational Rehabilitation: Provide vocational rehab assistance to qualifying candidates.   Vocational Rehab Evaluation & Intervention: Vocational Rehab - 07/08/17 1500      Initial Vocational Rehab Evaluation & Intervention   Assessment shows need for Vocational Rehabilitation  No       Education: Education Goals: Education classes will be provided on a variety of topics geared toward better understanding of heart health and risk factor modification. Participant will state understanding/return demonstration of topics presented  as noted by education test scores.  Learning Barriers/Preferences: Learning Barriers/Preferences - 07/08/17 1500      Learning Barriers/Preferences   Learning Barriers  None    Learning Preferences  None       Education Topics: General Nutrition Guidelines/Fats and Fiber: -Group instruction provided by verbal, written material, models and posters to present the general guidelines for heart healthy nutrition. Gives an explanation and review of dietary fats and fiber.   Controlling Sodium/Reading Food Labels: -Group verbal and written material supporting the discussion of sodium use in heart healthy nutrition. Review and explanation with models, verbal and written materials for utilization of the food label.   Exercise Physiology & Risk Factors: - Group verbal and written instruction with models to review the exercise physiology of the cardiovascular system and associated critical values. Details cardiovascular disease risk factors and the goals associated with each risk factor.   Aerobic Exercise & Resistance Training: - Gives group verbal and written discussion on the health impact of inactivity. On the components of aerobic and resistive training programs and the benefits of this training and how to safely progress through these programs.   Flexibility, Balance, General Exercise Guidelines: - Provides group verbal and written instruction on the benefits of flexibility and balance training programs. Provides general exercise guidelines with specific guidelines to those with heart or lung disease. Demonstration and skill practice provided.   Stress Management: - Provides group verbal and written instruction about the health risks of elevated stress, cause of high stress, and healthy ways to reduce stress.   Depression: - Provides group verbal and written instruction on the correlation between heart/lung disease and depressed mood, treatment options, and the stigmas associated with  seeking treatment.   Anatomy & Physiology of the Heart: - Group verbal and written instruction  and models provide basic cardiac anatomy and physiology, with the coronary electrical and arterial systems. Review of: AMI, Angina, Valve disease, Heart Failure, Cardiac Arrhythmia, Pacemakers, and the ICD.   Cardiac Procedures: - Group verbal and written instruction to review commonly prescribed medications for heart disease. Reviews the medication, class of the drug, and side effects. Includes the steps to properly store meds and maintain the prescription regimen. (beta blockers and nitrates)   Cardiac Medications I: - Group verbal and written instruction to review commonly prescribed medications for heart disease. Reviews the medication, class of the drug, and side effects. Includes the steps to properly store meds and maintain the prescription regimen.   Cardiac Medications II: -Group verbal and written instruction to review commonly prescribed medications for heart disease. Reviews the medication, class of the drug, and side effects. (all other drug classes)    Go Sex-Intimacy & Heart Disease, Get SMART - Goal Setting: - Group verbal and written instruction through game format to discuss heart disease and the return to sexual intimacy. Provides group verbal and written material to discuss and apply goal setting through the application of the S.M.A.R.T. Method.   Other Matters of the Heart: - Provides group verbal, written materials and models to describe Heart Failure, Angina, Valve Disease, Peripheral Artery Disease, and Diabetes in the realm of heart disease. Includes description of the disease process and treatment options available to the cardiac patient.   Exercise & Equipment Safety: - Individual verbal instruction and demonstration of equipment use and safety with use of the equipment.   Infection Prevention: - Provides verbal and written material to individual with discussion  of infection control including proper hand washing and proper equipment cleaning during exercise session.   Falls Prevention: - Provides verbal and written material to individual with discussion of falls prevention and safety.   Diabetes: - Individual verbal and written instruction to review signs/symptoms of diabetes, desired ranges of glucose level fasting, after meals and with exercise. Acknowledge that pre and post exercise glucose checks will be done for 3 sessions at entry of program.   Other: -Provides group and verbal instruction on various topics (see comments)    Knowledge Questionnaire Score: Knowledge Questionnaire Score - 07/08/17 1215      Knowledge Questionnaire Score   Pre Score  21/28 correct answers reviewed with Pamala Hurry       Core Components/Risk Factors/Patient Goals at Admission: Personal Goals and Risk Factors at Admission - 07/08/17 1454      Core Components/Risk Factors/Patient Goals on Admission    Weight Management  Yes;Obesity    Intervention  Weight Management: Develop a combined nutrition and exercise program designed to reach desired caloric intake, while maintaining appropriate intake of nutrient and fiber, sodium and fats, and appropriate energy expenditure required for the weight goal.;Weight Management: Provide education and appropriate resources to help participant work on and attain dietary goals.;Weight Management/Obesity: Establish reasonable short term and long term weight goals.;Obesity: Provide education and appropriate resources to help participant work on and attain dietary goals.    Admit Weight  185 lb (83.9 kg)    Goal Weight: Short Term  180 lb (81.6 kg)    Goal Weight: Long Term  160 lb (72.6 kg)    Expected Outcomes  Short Term: Continue to assess and modify interventions until short term weight is achieved;Long Term: Adherence to nutrition and physical activity/exercise program aimed toward attainment of established weight goal;Weight  Loss: Understanding of general recommendations for a balanced deficit meal  plan, which promotes 1-2 lb weight loss per week and includes a negative energy balance of [phone] kcal/d;Understanding recommendations for meals to include 15-35% energy as protein, 25-35% energy from fat, 35-60% energy from carbohydrates, less than 200mg  of dietary cholesterol, 20-35 gm of total fiber daily;Understanding of distribution of calorie intake throughout the day with the consumption of 4-5 meals/snacks    Hypertension  Yes    Intervention  Provide education on lifestyle modifcations including regular physical activity/exercise, weight management, moderate sodium restriction and increased consumption of fresh fruit, vegetables, and low fat dairy, alcohol moderation, and smoking cessation.;Monitor prescription use compliance.    Expected Outcomes  Short Term: Continued assessment and intervention until BP is < 140/72mm HG in hypertensive participants. < 130/51mm HG in hypertensive participants with diabetes, heart failure or chronic kidney disease.;Long Term: Maintenance of blood pressure at goal levels.    Lipids  Yes    Intervention  Provide education and support for participant on nutrition & aerobic/resistive exercise along with prescribed medications to achieve LDL 70mg , HDL >40mg .    Expected Outcomes  Short Term: Participant states understanding of desired cholesterol values and is compliant with medications prescribed. Participant is following exercise prescription and nutrition guidelines.;Long Term: Cholesterol controlled with medications as prescribed, with individualized exercise RX and with personalized nutrition plan. Value goals: LDL < 70mg , HDL > 40 mg.    Stress  Yes Suttyn states she is experiencing financial stress. She and her 83 year old husband have had a lot of health issues over the last couple years. She is thinking of going back to work     Nurse, mental health individual and/or small group  education and counseling on adjustment to heart disease, stress management and health-related lifestyle change. Teach and support self-help strategies.;Refer participants experiencing significant psychosocial distress to appropriate mental health specialists for further evaluation and treatment. When possible, include family members and significant others in education/counseling sessions.    Expected Outcomes  Short Term: Participant demonstrates changes in health-related behavior, relaxation and other stress management skills, ability to obtain effective social support, and compliance with psychotropic medications if prescribed.;Long Term: Emotional wellbeing is indicated by absence of clinically significant psychosocial distress or social isolation.       Core Components/Risk Factors/Patient Goals Review:    Core Components/Risk Factors/Patient Goals at Discharge (Final Review):    ITP Comments: ITP Comments    Row Name 07/08/17 1452           ITP Comments  Med Review completed. Initial ITP created. Diagnosis can be found in [facility] encounter 06/16/17          Comments: Initial ITP

## 2017-07-08 NOTE — Patient Instructions (Signed)
Patient Instructions  Patient Details  Name: Margaret [name] MRN: [id number] Date of Birth: 10-27-1940 Referring Provider:  Wellington Hampshire, MD  Below are your personal goals for exercise, nutrition, and risk factors. Our goal is to help you stay on track towards obtaining and maintaining these goals. We will be discussing your progress on these goals with you throughout the program.  Initial Exercise Prescription: Initial Exercise Prescription - 07/08/17 1500      Date of Initial Exercise RX and Referring Provider   Date  07/08/17    Referring Provider  Arida      Treadmill   MPH  2    Grade  0    Minutes  15    METs  2.5      Recumbant Bike   Level  1    RPM  60    Watts  8    Minutes  15    METs  2.2      NuStep   Level  2    SPM  80    Minutes  15    METs  2.2      Prescription Details   Frequency (times per week)  3    Duration  Progress to 45 minutes of aerobic exercise without signs/symptoms of physical distress      Intensity   THRR 40-80% of Max Heartrate  89-125    Ratings of Perceived Exertion  11-13    Perceived Dyspnea  0-4      Resistance Training   Training Prescription  Yes    Weight  3 lb    Reps  10-15       Exercise Goals: Frequency: Be able to perform aerobic exercise two to three times per week in program working toward 2-5 days per week of home exercise.  Intensity: Work with a perceived exertion of 11 (fairly light) - 15 (hard) while following your exercise prescription.  We will make changes to your prescription with you as you progress through the program.   Duration: Be able to do 30 to 45 minutes of continuous aerobic exercise in addition to a 5 minute warm-up and a 5 minute cool-down routine.   Nutrition Goals: Your personal nutrition goals will be established when you do your nutrition analysis with the dietician.  The following are general nutrition guidelines to follow: Cholesterol < 200mg /day Sodium < 1500mg /day Fiber:  Women over 50 yrs - 21 grams per day  Personal Goals: Personal Goals and Risk Factors at Admission - 07/08/17 1454      Core Components/Risk Factors/Patient Goals on Admission    Weight Management  Yes;Obesity    Intervention  Weight Management: Develop a combined nutrition and exercise program designed to reach desired caloric intake, while maintaining appropriate intake of nutrient and fiber, sodium and fats, and appropriate energy expenditure required for the weight goal.;Weight Management: Provide education and appropriate resources to help participant work on and attain dietary goals.;Weight Management/Obesity: Establish reasonable short term and long term weight goals.;Obesity: Provide education and appropriate resources to help participant work on and attain dietary goals.    Admit Weight  185 lb (83.9 kg)    Goal Weight: Short Term  180 lb (81.6 kg)    Goal Weight: Long Term  160 lb (72.6 kg)    Expected Outcomes  Short Term: Continue to assess and modify interventions until short term weight is achieved;Long Term: Adherence to nutrition and physical activity/exercise program aimed toward attainment of established weight  goal;Weight Loss: Understanding of general recommendations for a balanced deficit meal plan, which promotes 1-2 lb weight loss per week and includes a negative energy balance of [phone] kcal/d;Understanding recommendations for meals to include 15-35% energy as protein, 25-35% energy from fat, 35-60% energy from carbohydrates, less than 200mg  of dietary cholesterol, 20-35 gm of total fiber daily;Understanding of distribution of calorie intake throughout the day with the consumption of 4-5 meals/snacks    Hypertension  Yes    Intervention  Provide education on lifestyle modifcations including regular physical activity/exercise, weight management, moderate sodium restriction and increased consumption of fresh fruit, vegetables, and low fat dairy, alcohol moderation, and smoking  cessation.;Monitor prescription use compliance.    Expected Outcomes  Short Term: Continued assessment and intervention until BP is < 140/39mm HG in hypertensive participants. < 130/32mm HG in hypertensive participants with diabetes, heart failure or chronic kidney disease.;Long Term: Maintenance of blood pressure at goal levels.    Lipids  Yes    Intervention  Provide education and support for participant on nutrition & aerobic/resistive exercise along with prescribed medications to achieve LDL 70mg , HDL >40mg .    Expected Outcomes  Short Term: Participant states understanding of desired cholesterol values and is compliant with medications prescribed. Participant is following exercise prescription and nutrition guidelines.;Long Term: Cholesterol controlled with medications as prescribed, with individualized exercise RX and with personalized nutrition plan. Value goals: LDL < 70mg , HDL > 40 mg.    Stress  Yes Fallan states she is experiencing financial stress. She and her 79 year old husband have had a lot of health issues over the last couple years. She is thinking of going back to work     Nurse, mental health individual and/or small group education and counseling on adjustment to heart disease, stress management and health-related lifestyle change. Teach and support self-help strategies.;Refer participants experiencing significant psychosocial distress to appropriate mental health specialists for further evaluation and treatment. When possible, include family members and significant others in education/counseling sessions.    Expected Outcomes  Short Term: Participant demonstrates changes in health-related behavior, relaxation and other stress management skills, ability to obtain effective social support, and compliance with psychotropic medications if prescribed.;Long Term: Emotional wellbeing is indicated by absence of clinically significant psychosocial distress or social isolation.       Tobacco  Use Initial Evaluation: Social History   Tobacco Use  Smoking Status Former Smoker  . Types: Cigarettes  . Last attempt to quit: 06/22/1984  . Years since quitting: 33.0  Smokeless Tobacco Never Used    Exercise Goals and Review: Exercise Goals    Row Name 07/08/17 1522             Exercise Goals   Increase Physical Activity  Yes       Intervention  Provide advice, education, support and counseling about physical activity/exercise needs.;Develop an individualized exercise prescription for aerobic and resistive training based on initial evaluation findings, risk stratification, comorbidities and participant's personal goals.       Expected Outcomes  Short Term: Attend rehab on a regular basis to increase amount of physical activity.;Long Term: Add in home exercise to make exercise part of routine and to increase amount of physical activity.;Long Term: Exercising regularly at least 3-5 days a week.       Increase Strength and Stamina  Yes       Intervention  Provide advice, education, support and counseling about physical activity/exercise needs.;Develop an individualized exercise prescription for aerobic and resistive training based  on initial evaluation findings, risk stratification, comorbidities and participant's personal goals.       Expected Outcomes  Short Term: Increase workloads from initial exercise prescription for resistance, speed, and METs.;Short Term: Perform resistance training exercises routinely during rehab and add in resistance training at home;Long Term: Improve cardiorespiratory fitness, muscular endurance and strength as measured by increased METs and functional capacity (6MWT)       Able to understand and use rate of perceived exertion (RPE) scale  Yes       Intervention  Provide education and explanation on how to use RPE scale       Expected Outcomes  Short Term: Able to use RPE daily in rehab to express subjective intensity level;Long Term:  Able to use RPE to guide  intensity level when exercising independently       Able to understand and use Dyspnea scale  Yes       Intervention  Provide education and explanation on how to use Dyspnea scale       Expected Outcomes  Short Term: Able to use Dyspnea scale daily in rehab to express subjective sense of shortness of breath during exertion;Long Term: Able to use Dyspnea scale to guide intensity level when exercising independently       Knowledge and understanding of Target Heart Rate Range (THRR)  Yes       Intervention  Provide education and explanation of THRR including how the numbers were predicted and where they are located for reference       Expected Outcomes  Short Term: Able to state/look up THRR;Long Term: Able to use THRR to govern intensity when exercising independently;Short Term: Able to use daily as guideline for intensity in rehab       Able to check pulse independently  Yes       Intervention  Provide education and demonstration on how to check pulse in carotid and radial arteries.;Review the importance of being able to check your own pulse for safety during independent exercise       Expected Outcomes  Short Term: Able to explain why pulse checking is important during independent exercise;Long Term: Able to check pulse independently and accurately       Understanding of Exercise Prescription  Yes       Intervention  Provide education, explanation, and written materials on patient's individual exercise prescription       Expected Outcomes  Short Term: Able to explain program exercise prescription;Long Term: Able to explain home exercise prescription to exercise independently          Copy of goals given to participant.

## 2017-07-13 ENCOUNTER — Encounter: Payer: Self-pay | Admitting: Cardiology

## 2017-07-13 ENCOUNTER — Ambulatory Visit: Payer: PPO | Admitting: Cardiology

## 2017-07-13 VITALS — BP 144/72 | HR 62 | Ht 67.0 in | Wt 182.0 lb

## 2017-07-13 DIAGNOSIS — I214 Non-ST elevation (NSTEMI) myocardial infarction: Secondary | ICD-10-CM

## 2017-07-13 DIAGNOSIS — I519 Heart disease, unspecified: Secondary | ICD-10-CM

## 2017-07-13 DIAGNOSIS — I10 Essential (primary) hypertension: Secondary | ICD-10-CM | POA: Diagnosis not present

## 2017-07-13 DIAGNOSIS — I5189 Other ill-defined heart diseases: Secondary | ICD-10-CM

## 2017-07-13 DIAGNOSIS — I2510 Atherosclerotic heart disease of native coronary artery without angina pectoris: Secondary | ICD-10-CM | POA: Diagnosis not present

## 2017-07-13 DIAGNOSIS — Z9861 Coronary angioplasty status: Secondary | ICD-10-CM | POA: Diagnosis not present

## 2017-07-13 DIAGNOSIS — E7800 Pure hypercholesterolemia, unspecified: Secondary | ICD-10-CM

## 2017-07-13 DIAGNOSIS — Z8673 Personal history of transient ischemic attack (TIA), and cerebral infarction without residual deficits: Secondary | ICD-10-CM | POA: Diagnosis not present

## 2017-07-13 MED ORDER — EZETIMIBE 10 MG PO TABS: 10.0000 mg | ORAL_TABLET | Freq: Every day | ORAL | 5 refills | Status: DC

## 2017-07-13 NOTE — Assessment & Plan Note (Signed)
May 2017

## 2017-07-13 NOTE — Assessment & Plan Note (Signed)
RCA DES 2013- thrombotic in stent restenosis 06/14/17- treated wirh PCI. Plavix changed to Brilinta

## 2017-07-13 NOTE — Assessment & Plan Note (Signed)
Grade 2 DD, normal LVF by echo Dec 2018

## 2017-07-13 NOTE — Patient Instructions (Signed)
Medication Instructions:  START ZETIA 10 MG DAILY   Labwork: FASTING LP/HFP 2 MONTHS   Testing/Procedures: NONE  Follow-Up: Your physician recommends that you schedule a follow-up appointment in: DR Martinique 3 MONTHS   Any Other Special Instructions Will Be Listed Below (If Applicable). YOU CAN RESUME TENNIS IN 2 WEEKS   If you need a refill on your cardiac medications before your next appointment, please call your pharmacy.

## 2017-07-13 NOTE — Assessment & Plan Note (Signed)
Fair control.

## 2017-07-13 NOTE — Progress Notes (Signed)
07/13/2017 Margaret [name]   Feb 12, 1941  [id number]  Primary Physician Clemmie Krill Lynnell Jude, MD Primary Cardiologist: Dr Martinique  HPI:  77 y.o. female followed by Dr Martinique with history of CAD, status post DES to the RCA in 2013, previous stroke, hypertension, hyperlipidemia and GERD who presented 06/14/17 with chest pain and was found to have significantly elevated troponin with EKG changes suggestive of inferior infarct that did not meet criteria for a STEMI. She was taken to the lab by Dr Audelia Acton. Cath revealed thrombotic in stent restenosis in the RCA. She was treated with extensive balloon angioplasty, aspiration thrombectomy, and overlapping DES placement. Relook cath for chest pain and SOB later that day showed a widely patent RCA site. She was felt to be volume overloaded and HTN and he medications were adjusted. Echo prior to discharge showed an EF of 55-60% with mild LVH, and grade 2 DD.   She is in the office today for follow up/. She has done well since discharge. She will start cardiac rehab this week. I suggested she wait another two weeks before resuming tennis. She has been changed from Brilinta to Plavix, (essentially a Plavix failure). Her Lopressor was changed to Coreg. Interestingly her LDL was 146, and she says she was compliant with Lipitor 80 mg prior to admission.      Current Outpatient Medications  Medication Sig Dispense Refill  . amLODipine (NORVASC) 5 MG tablet Take 1 tablet (5 mg total) by mouth daily. 90 tablet 3  . aspirin EC 81 MG tablet Take 1 tablet (81 mg total) by mouth daily. 90 tablet 3  . atorvastatin (LIPITOR) 80 MG tablet Take 80 mg by mouth daily.    . carvedilol (COREG) 6.25 MG tablet Take 1 tablet (6.25 mg total) by mouth 2 (two) times daily with a meal. 60 tablet 1  . Docusate Calcium (STOOL SOFTENER PO) Take by mouth. As needed    . losartan (COZAAR) 50 MG tablet Take 1 tablet (50 mg total) by mouth daily. 30 tablet 1  . nitroGLYCERIN (NITROSTAT) 0.4 MG  SL tablet Place 1 tablet (0.4 mg total) under the tongue every 5 (five) minutes as needed for chest pain. 30 tablet 2  . pantoprazole (PROTONIX) 40 MG tablet Take 40 mg by mouth daily.    . sertraline (ZOLOFT) 25 MG tablet Take 25 mg by mouth daily.    . ticagrelor (BRILINTA) 90 MG TABS tablet Take 1 tablet (90 mg total) by mouth 2 (two) times daily. 60 tablet 1  . ezetimibe (ZETIA) 10 MG tablet Take 1 tablet (10 mg total) by mouth daily. 30 tablet 5   No current facility-administered medications for this visit.     Allergies  Allergen Reactions  . Erythromycin Nausea Only  . Other Rash    Micropore tape  . Tape Itching and Rash    Also "band-aids"    Past Medical History:  Diagnosis Date  . Anxiety and depression   . CAD (coronary artery disease)   . Cellulitis of right leg 2005  . Colon polyp   . Dyspnea on exertion   . GERD (gastroesophageal reflux disease)   . Hyperlipidemia   . Hypertension   . Morbid obesity ([place])   . Presence of stent in right coronary artery 02/08/12   2.5x18 Xience distal RCA  . Right shoulder pain   . Stroke ([place])   . Vaginal prolapse     Social History   Socioeconomic History  . Marital status: Married  Spouse name: Not on file  . Number of children: 1  . Years of education: Not on file  . Highest education level: Not on file  Social Needs  . Financial resource strain: Not on file  . Food insecurity - worry: Not on file  . Food insecurity - inability: Not on file  . Transportation needs - medical: Not on file  . Transportation needs - non-medical: Not on file  Occupational History  . Occupation: nurse-retired  Tobacco Use  . Smoking status: Former Smoker    Types: Cigarettes    Last attempt to quit: 06/22/1984    Years since quitting: 33.0  . Smokeless tobacco: Never Used  Substance and Sexual Activity  . Alcohol use: No    Frequency: Never  . Drug use: No  . Sexual activity: Yes    Birth control/protection: Surgical     Comment: hysterectomy  Other Topics Concern  . Not on file  Social History Narrative  . Not on file     Family History  Problem Relation Age of Onset  . Heart attack Father        MI age 19 and 46  . Heart attack Sister 30       cabg  . Diabetes Sister   . Hypertension Sister   . Heart attack Brother 27       cabg  . Diabetes Brother   . Cancer Mother        colon, liver  . Diabetes Maternal Grandmother   . Cancer Maternal Grandfather        unknown  . Hypertension Daughter   . Thyroid disease Daughter      Review of Systems: General: negative for chills, fever, night sweats or weight changes.  Cardiovascular: negative for chest pain, dyspnea on exertion, edema, orthopnea, palpitations, paroxysmal nocturnal dyspnea or shortness of breath Dermatological: negative for rash Respiratory: negative for cough or wheezing Urologic: negative for hematuria Abdominal: negative for nausea, vomiting, diarrhea, bright red blood per rectum, melena, or hematemesis Neurologic: negative for visual changes, syncope, or dizziness All other systems reviewed and are otherwise negative except as noted above.    Blood pressure (!) 144/72, pulse 62, height 5\' 7"  (1.702 m), weight 182 lb (82.6 kg), SpO2 100 %.  General appearance: alert, cooperative and no distress Neck: no carotid bruit and no JVD Lungs: clear to auscultation bilaterally Heart: regular rate and rhythm Extremities: extremities normal, atraumatic, no cyanosis or edema Skin: Skin color, texture, turgor normal. No rashes or lesions Neurologic: Grossly normal  EKG NSR, SB 58, inferior Qs, poor anterior RW and TWI, occasional PVC  ASSESSMENT AND PLAN:   NSTEMI (non-ST elevated myocardial infarction) (HCC) S/P NSTEMI (Troponin > 65) 06/14/17 secondary to ISR of her RCA stent- treated with PCI  CAD S/P percutaneous coronary angioplasty RCA DES 2013- thrombotic in stent restenosis 06/14/17- treated wirh PCI. Plavix changed to  Brilinta  Hypertension Fair control  Diastolic dysfunction Grade 2 DD, normal LVF by echo Dec 2018  Hyperlipidemia LDL was 146 on Lipitor 80 mg- add Zetia   PLAN  Add Zetia 10 mg. Check lipids and LFTS in 2 months, Dr Martinique in 3 months. I spoke with pt and her daughter (an Therapist, sports).   Kerin Ransom PA-C 07/13/2017 3:30 PM

## 2017-07-13 NOTE — Assessment & Plan Note (Signed)
S/P NSTEMI (Troponin > 65) 06/14/17 secondary to ISR of her RCA stent- treated with PCI

## 2017-07-13 NOTE — Assessment & Plan Note (Signed)
LDL was 146 on Lipitor 80 mg- add Zetia

## 2017-07-19 ENCOUNTER — Encounter: Payer: PPO | Admitting: *Deleted

## 2017-07-19 DIAGNOSIS — Z955 Presence of coronary angioplasty implant and graft: Secondary | ICD-10-CM

## 2017-07-19 DIAGNOSIS — I252 Old myocardial infarction: Secondary | ICD-10-CM | POA: Diagnosis not present

## 2017-07-19 DIAGNOSIS — I214 Non-ST elevation (NSTEMI) myocardial infarction: Secondary | ICD-10-CM

## 2017-07-19 NOTE — Progress Notes (Signed)
Daily Session Note  Patient Details  Name: Margaret [name] MRN: [id number] Date of Birth: [birth year] Referring Provider:     Cardiac Rehab from 07/08/2017 in [facility]  Referring Provider  Arida      Encounter Date: 07/19/2017  Check In: Session Check In - 07/19/17 1623      Check-In   Location  [facility]Cardiac [facility] Pulmonary Re[facility]    Staff Present  Nada Maclachlan, BA, ACSM CEP, Exercise Physiologist;Carroll Enterkin, RN, Moises Blood, BS, ACSM CEP, Exercise Physiologist    Supervising physician immediately available to respond to emergencies  See telemetry face sheet for immediately available ER MD    Medication changes reported      No    Fall or balance concerns reported     No    Warm-up and Cool-down  Performed on first and last piece of equipment    Resistance Training Performed  Yes    VAD Patient?  No      Pain Assessment   Currently in Pain?  No/denies    Multiple Pain Sites  No          Social History   Tobacco Use  Smoking Status Former Smoker  . Types: Cigarettes  . Last attempt to quit: 06/22/1984  . Years since quitting: 33.0  Smokeless Tobacco Never Used    Goals Met:  Exercise tolerated well Personal goals reviewed No report of cardiac concerns or symptoms  Goals Unmet:  Not Applicable  Comments: First full day of exercise!  Patient was oriented to gym and equipment including functions, settings, policies, and procedures.  Patient's individual exercise prescription and treatment plan were reviewed.  All starting workloads were established based on the results of the 6 minute walk test done at initial orientation visit.  The plan for exercise progression was also introduced and progression will be customized based on patient's performance and goals.    Dr. Emily Filbert is Medical Director for [facility].

## 2017-07-21 ENCOUNTER — Encounter: Payer: Self-pay | Admitting: *Deleted

## 2017-07-21 DIAGNOSIS — I252 Old myocardial infarction: Secondary | ICD-10-CM | POA: Diagnosis not present

## 2017-07-21 DIAGNOSIS — I214 Non-ST elevation (NSTEMI) myocardial infarction: Secondary | ICD-10-CM

## 2017-07-21 DIAGNOSIS — Z955 Presence of coronary angioplasty implant and graft: Secondary | ICD-10-CM

## 2017-07-21 NOTE — Progress Notes (Signed)
Cardiac Individual Treatment Plan  Patient Details  Name: Margaret [name] MRN: [id number] Date of Birth: 20-Feb-1941 Referring Provider:     Cardiac Rehab from 07/08/2017 in [facility] Cardiac and Pulmonary Rehab  Referring Provider  Arida      Initial Encounter Date:    Cardiac Rehab from 07/08/2017 in [facility] Cardiac and Pulmonary Rehab  Date  07/08/17  Referring Provider  Fletcher Anon      Visit Diagnosis: NSTEMI (non-ST elevated myocardial infarction) [facility])  Status post coronary artery stent placement  Patient's Home Medications on Admission:  Current Outpatient Medications:  .  amLODipine (NORVASC) 5 MG tablet, Take 1 tablet (5 mg total) by mouth daily., Disp: 90 tablet, Rfl: 3 .  aspirin EC 81 MG tablet, Take 1 tablet (81 mg total) by mouth daily., Disp: 90 tablet, Rfl: 3 .  atorvastatin (LIPITOR) 80 MG tablet, Take 80 mg by mouth daily., Disp: , Rfl:  .  carvedilol (COREG) 6.25 MG tablet, Take 1 tablet (6.25 mg total) by mouth 2 (two) times daily with a meal., Disp: 60 tablet, Rfl: 1 .  Docusate Calcium (STOOL SOFTENER PO), Take by mouth. As needed, Disp: , Rfl:  .  ezetimibe (ZETIA) 10 MG tablet, Take 1 tablet (10 mg total) by mouth daily., Disp: 30 tablet, Rfl: 5 .  losartan (COZAAR) 50 MG tablet, Take 1 tablet (50 mg total) by mouth daily., Disp: 30 tablet, Rfl: 1 .  nitroGLYCERIN (NITROSTAT) 0.4 MG SL tablet, Place 1 tablet (0.4 mg total) under the tongue every 5 (five) minutes as needed for chest pain., Disp: 30 tablet, Rfl: 2 .  pantoprazole (PROTONIX) 40 MG tablet, Take 40 mg by mouth daily., Disp: , Rfl:  .  sertraline (ZOLOFT) 25 MG tablet, Take 25 mg by mouth daily., Disp: , Rfl:  .  ticagrelor (BRILINTA) 90 MG TABS tablet, Take 1 tablet (90 mg total) by mouth 2 (two) times daily., Disp: 60 tablet, Rfl: 1  Past Medical History: Past Medical History:  Diagnosis Date  . Anxiety and depression   . CAD (coronary artery disease)   . Cellulitis of right leg 2005  . Colon polyp   .  Dyspnea on exertion   . GERD (gastroesophageal reflux disease)   . Hyperlipidemia   . Hypertension   . Morbid obesity ([place])   . Presence of stent in right coronary artery 02/08/12   2.5x18 Xience distal RCA  . Right shoulder pain   . Stroke ([place])   . Vaginal prolapse     Tobacco Use: Social History   Tobacco Use  Smoking Status Former Smoker  . Types: Cigarettes  . Last attempt to quit: 06/22/1984  . Years since quitting: 33.1  Smokeless Tobacco Never Used    Labs: Recent Review [facility] Cardiac and Pulmonary Rehab Latest Ref Rng & Units 06/15/2017   Cholestrol 0 - 200 mg/dL 225(H)   LDLCALC 0 - 99 mg/dL 146(H)   HDL >40 mg/dL 60   Trlycerides <150 mg/dL 96       Exercise Target Goals:    Exercise Program Goal: Individual exercise prescription set using results from initial 6 min walk test and THRR while considering  patient's activity barriers and safety.   Exercise Prescription Goal: Initial exercise prescription builds to 30-45 minutes a day of aerobic activity, 2-3 days per week.  Home exercise guidelines will be given to patient during program as part of exercise prescription that the participant will acknowledge.  Activity Barriers &  Risk Stratification: Activity Barriers & Cardiac Risk Stratification - 07/08/17 1502      Activity Barriers & Cardiac Risk Stratification   Activity Barriers  Joint Problems left knee pain, receives injections for    Cardiac Risk Stratification  Moderate       6 Minute Walk: 6 Minute Walk    Row Name 07/08/17 1522         6 Minute Walk   Distance  1150 feet     Walk Time  6 minutes     # of Rest Breaks  0     MPH  2.18     METS  2.28     RPE  15     Perceived Dyspnea   0     VO2 Peak  7.99     Symptoms  No     Resting HR  55 bpm     Resting BP  128/62     Resting Oxygen Saturation   100 %     Exercise Oxygen Saturation  during 6 min walk  100 %     Max Ex. HR  102 bpm     Max Ex. BP  160/64      2 Minute Post BP  128/58        Oxygen Initial Assessment:   Oxygen Re-Evaluation:   Oxygen Discharge (Final Oxygen Re-Evaluation):   Initial Exercise Prescription: Initial Exercise Prescription - 07/08/17 1500      Date of Initial Exercise RX and Referring Provider   Date  07/08/17    Referring Provider  Arida      Treadmill   MPH  2    Grade  0    Minutes  15    METs  2.5      Recumbant Bike   Level  1    RPM  60    Watts  8    Minutes  15    METs  2.2      NuStep   Level  2    SPM  80    Minutes  15    METs  2.2      Prescription Details   Frequency (times per week)  3    Duration  Progress to 45 minutes of aerobic exercise without signs/symptoms of physical distress      Intensity   THRR 40-80% of Max Heartrate  89-125    Ratings of Perceived Exertion  11-13    Perceived Dyspnea  0-4      Resistance Training   Training Prescription  Yes    Weight  3 lb    Reps  10-15       Perform Capillary Blood Glucose checks as needed.  Exercise Prescription Changes: Exercise Prescription Changes    Row Name 07/08/17 1500             Response to Exercise   Blood Pressure (Admit)  128/62       Blood Pressure (Exercise)  160/64       Blood Pressure (Exit)  128/58       Heart Rate (Admit)  68 bpm       Heart Rate (Exercise)  102 bpm       Heart Rate (Exit)  60 bpm       Oxygen Saturation (Admit)  100 %       Rating of Perceived Exertion (Exercise)  15          Exercise Comments: Exercise  Comments    Row Name 07/19/17 1626           Exercise Comments   First full day of exercise!  Patient was oriented to gym and equipment including functions, settings, policies, and procedures.  Patient's individual exercise prescription and treatment plan were reviewed.  All starting workloads were established based on the results of the 6 minute walk test done at initial orientation visit.  The plan for exercise progression was also introduced and  progression will be customized based on patie          Exercise Goals and Review: Exercise Goals    Row Name 07/08/17 1522             Exercise Goals   Increase Physical Activity  Yes       Intervention  Provide advice, education, support and counseling about physical activity/exercise needs.;Develop an individualized exercise prescription for aerobic and resistive training based on initial evaluation findings, risk stratification, comorbidities and participant's personal goals.       Expected Outcomes  Short Term: Attend rehab on a regular basis to increase amount of physical activity.;Long Term: Add in home exercise to make exercise part of routine and to increase amount of physical activity.;Long Term: Exercising regularly at least 3-5 days a week.       Increase Strength and Stamina  Yes       Intervention  Provide advice, education, support and counseling about physical activity/exercise needs.;Develop an individualized exercise prescription for aerobic and resistive training based on initial evaluation findings, risk stratification, comorbidities and participant's personal goals.       Expected Outcomes  Short Term: Increase workloads from initial exercise prescription for resistance, speed, and METs.;Short Term: Perform resistance training exercises routinely during rehab and add in resistance training at home;Long Term: Improve cardiorespiratory fitness, muscular endurance and strength as measured by increased METs and functional capacity (6MWT)       Able to understand and use rate of perceived exertion (RPE) scale  Yes       Intervention  Provide education and explanation on how to use RPE scale       Expected Outcomes  Short Term: Able to use RPE daily in rehab to express subjective intensity level;Long Term:  Able to use RPE to guide intensity level when exercising independently       Able to understand and use Dyspnea scale  Yes       Intervention  Provide education and  explanation on how to use Dyspnea scale       Expected Outcomes  Short Term: Able to use Dyspnea scale daily in rehab to express subjective sense of shortness of breath during exertion;Long Term: Able to use Dyspnea scale to guide intensity level when exercising independently       Knowledge and understanding of Target Heart Rate Range (THRR)  Yes       Intervention  Provide education and explanation of THRR including how the numbers were predicted and where they are located for reference       Expected Outcomes  Short Term: Able to state/look up THRR;Long Term: Able to use THRR to govern intensity when exercising independently;Short Term: Able to use daily as guideline for intensity in rehab       Able to check pulse independently  Yes       Intervention  Provide education and demonstration on how to check pulse in carotid and radial arteries.;Review the importance of being able to check  your own pulse for safety during independent exercise       Expected Outcomes  Short Term: Able to explain why pulse checking is important during independent exercise;Long Term: Able to check pulse independently and accurately       Understanding of Exercise Prescription  Yes       Intervention  Provide education, explanation, and written materials on patient's individual exercise prescription       Expected Outcomes  Short Term: Able to explain program exercise prescription;Long Term: Able to explain home exercise prescription to exercise independently          Exercise Goals Re-Evaluation : Exercise Goals Re-Evaluation    Row Name 07/19/17 1627             Exercise Goal Re-Evaluation   Exercise Goals Review  Increase Physical Activity;Increase Strength and Stamina;Able to understand and use rate of perceived exertion (RPE) scale;Knowledge and understanding of Target Heart Rate Range (THRR);Understanding of Exercise Prescription       Comments  Reviewed RPE scale, THR and program prescription with pt today.   Pt voiced understanding and was given a copy of goals to take home.        Expected Outcomes  Short - Use RPE daily to regulate intensity  Long - follow program prescription in [facility]          Discharge Exercise Prescription (Final Exercise Prescription Changes): Exercise Prescription Changes - 07/08/17 1500      Response to Exercise   Blood Pressure (Admit)  128/62    Blood Pressure (Exercise)  160/64    Blood Pressure (Exit)  128/58    Heart Rate (Admit)  68 bpm    Heart Rate (Exercise)  102 bpm    Heart Rate (Exit)  60 bpm    Oxygen Saturation (Admit)  100 %    Rating of Perceived Exertion (Exercise)  15       Nutrition:  Target Goals: Understanding of nutrition guidelines, daily intake of sodium <1575m, cholesterol <2060m calories 30% from fat and 7% or less from saturated fats, daily to have 5 or more servings of fruits and vegetables.  Biometrics: Pre Biometrics - 07/08/17 1521      Pre Biometrics   Height  _0  (1.676 m)    Weight  185 lb 9.6 oz (84.2 kg)    Waist Circumference  37 inches    Hip Circumference  47 inches    Waist to Hip Ratio  0.79 %    BMI (Calculated)  29.97    Single Leg Stand  4.93 seconds        Nutrition Therapy Plan and Nutrition Goals:   Nutrition Assessments: Nutrition Assessments - 07/08/17 1457      MEDFICTS Scores   Pre Score  16       Nutrition Goals Re-Evaluation:   Nutrition Goals Discharge (Final Nutrition Goals Re-Evaluation):   Psychosocial: Target Goals: Acknowledge presence or absence of significant depression and/or stress, maximize coping skills, provide positive support system. Participant is able to verbalize types and ability to use techniques and skills needed for reducing stress and depression.   Initial Review & Psychosocial Screening: Initial Psych Review & Screening - 07/08/17 1457      Initial Review   Current issues with  Current Depression;Current Stress Concerns    Source of Stress Concerns   Unable to participate in former interests or hobbies;Financial    Comments  She and her husband are under financial stress related to  their health problems. Her zoloft was recently increased. She said her depression currently is steming from "what could of been"      [place]?  Yes      Screening Interventions   Interventions  Encouraged to exercise;Program counselor consult;To provide support and resources with identified psychosocial needs    Expected Outcomes  Short Term goal: Utilizing psychosocial counselor, staff and physician to assist with identification of specific Stressors or current issues interfering with healing process. Setting desired goal for each stressor or current issue identified.;Long Term Goal: Stressors or current issues are controlled or eliminated.;Short Term goal: Identification and review with participant of any Quality of Life or Depression concerns found by scoring the questionnaire.;Long Term goal: The participant improves quality of Life and PHQ9 Scores as seen by post scores and/or verbalization of changes       Quality of Life Scores:  Quality of Life - 07/08/17 1459      Quality of Life Scores   Health/Function Pre  20.4 %    Socioeconomic Pre  20.25 %    Psych/Spiritual Pre  20 %    Family Pre  20.8 %    GLOBAL Pre  20.34 %      Scores of 19 and below usually indicate a poorer quality of life in these areas.  A difference of  2-3 points is a clinically meaningful difference.  A difference of 2-3 points in the total score of the Quality of Life Index has been associated with significant improvement in overall quality of life, self-image, physical symptoms, and general health in studies assessing change in quality of life.  PHQ-9: Recent Review Flowsheet Data    Depression screen [facility] 2/9 07/08/2017   Decreased Interest 2   Down, Depressed, Hopeless 1   PHQ - 2 Score 3   Altered sleeping 1   Tired, decreased energy 1    Change in appetite 0   Feeling bad or failure about yourself  3   Trouble concentrating 0   Moving slowly or fidgety/restless 0   Suicidal thoughts 0   PHQ-9 Score 8   Difficult doing work/chores Somewhat difficult     Interpretation of Total Score  Total Score Depression Severity:  1-4 = Minimal depression, 5-9 = Mild depression, 10-14 = Moderate depression, 15-19 = Moderately severe depression, 20-27 = Severe depression   Psychosocial Evaluation and Intervention: Psychosocial Evaluation - 07/19/17 1703      Psychosocial Evaluation & Interventions   Interventions  Encouraged to exercise with the program and follow exercise prescription;Relaxation education;Stress management education    Comments  Counselor met with Ms. Sylve Pamala Hurry) for initial psychosocial evaluation.  She is a 77 year old who had a heart attack on Christmas Eve.  She has a strong support system with a spouse of over 30 years; a daughter who lives locally; good neighbors and some involvement in her local church.  Nina had a stroke 1.5 years ago but has mostly recovered from this.  She sleeps well and has a fair appetite.  She denies a history of depression or anxiety or any current symptoms.  Her mood is generally stable and positive.  Madinah reports having multiple stressors in her life at this time with her health; finances and conflict with her spouse at times who is a "control freak."  She has goals to increase her stamina and strength.  Obera reports her dog and playing tennis are her stress reducers currently in  her life.  Staff will follow with her throughout the course of this program.      Expected Outcomes  Kyliyah will benefit from consistent exercise to achieve her stated goals.  The educational and psychoeducational components of this program will be helpful for understanding and coping more positively with her condition and the stress in her life currently.      Continue Psychosocial Services   Follow up  required by staff       Psychosocial Re-Evaluation:   Psychosocial Discharge (Final Psychosocial Re-Evaluation):   Vocational Rehabilitation: Provide vocational rehab assistance to qualifying candidates.   Vocational Rehab Evaluation & Intervention: Vocational Rehab - 07/08/17 1500      Initial Vocational Rehab Evaluation & Intervention   Assessment shows need for Vocational Rehabilitation  No       Education: Education Goals: Education classes will be provided on a variety of topics geared toward better understanding of heart health and risk factor modification. Participant will state understanding/return demonstration of topics presented as noted by education test scores.  Learning Barriers/Preferences: Learning Barriers/Preferences - 07/08/17 1500      Learning Barriers/Preferences   Learning Barriers  None    Learning Preferences  None       Education Topics:  AED/CPR: - Group verbal and written instruction with the use of models to demonstrate the basic use of the AED with the basic ABC's of resuscitation.   General Nutrition Guidelines/Fats and Fiber: -Group instruction provided by verbal, written material, models and posters to present the general guidelines for heart healthy nutrition. Gives an explanation and review of dietary fats and fiber.   Cardiac Rehab from 07/19/2017 in [facility] Cardiac and Pulmonary Rehab  Date  07/19/17  Educator  PI  Instruction Review Code  1- Verbalizes Understanding      Controlling Sodium/Reading Food Labels: -Group verbal and written material supporting the discussion of sodium use in heart healthy nutrition. Review and explanation with models, verbal and written materials for utilization of the food label.   Exercise Physiology & General Exercise Guidelines: - Group verbal and written instruction with models to review the exercise physiology of the cardiovascular system and associated critical values. Provides general exercise  guidelines with specific guidelines to those with heart or lung disease.    Aerobic Exercise & Resistance Training: - Gives group verbal and written instruction on the various components of exercise. Focuses on aerobic and resistive training programs and the benefits of this training and how to safely progress through these programs..   Flexibility, Balance, Mind/Body Relaxation: Provides group verbal/written instruction on the benefits of flexibility and balance training, including mind/body exercise modes such as yoga, pilates and tai chi.  Demonstration and skill practice provided.   Stress and Anxiety: - Provides group verbal and written instruction about the health risks of elevated stress and causes of high stress.  Discuss the correlation between heart/lung disease and anxiety and treatment options. Review healthy ways to manage with stress and anxiety.   Depression: - Provides group verbal and written instruction on the correlation between heart/lung disease and depressed mood, treatment options, and the stigmas associated with seeking treatment.   Anatomy & Physiology of the Heart: - Group verbal and written instruction and models provide basic cardiac anatomy and physiology, with the coronary electrical and arterial systems. Review of Valvular disease and Heart Failure   Cardiac Procedures: - Group verbal and written instruction to review commonly prescribed medications for heart disease. Reviews the medication, class of the  drug, and side effects. Includes the steps to properly store meds and maintain the prescription regimen. (beta blockers and nitrates)   Cardiac Medications I: - Group verbal and written instruction to review commonly prescribed medications for heart disease. Reviews the medication, class of the drug, and side effects. Includes the steps to properly store meds and maintain the prescription regimen.   Cardiac Medications II: -Group verbal and written  instruction to review commonly prescribed medications for heart disease. Reviews the medication, class of the drug, and side effects. (all other drug classes)    Go Sex-Intimacy & Heart Disease, Get SMART - Goal Setting: - Group verbal and written instruction through game format to discuss heart disease and the return to sexual intimacy. Provides group verbal and written material to discuss and apply goal setting through the application of the S.M.A.R.T. Method.   Other Matters of the Heart: - Provides group verbal, written materials and models to describe Stable Angina and Peripheral Artery. Includes description of the disease process and treatment options available to the cardiac patient.   Exercise & Equipment Safety: - Individual verbal instruction and demonstration of equipment use and safety with use of the equipment.   Infection Prevention: - Provides verbal and written material to individual with discussion of infection control including proper hand washing and proper equipment cleaning during exercise session.   Falls Prevention: - Provides verbal and written material to individual with discussion of falls prevention and safety.   Diabetes: - Individual verbal and written instruction to review signs/symptoms of diabetes, desired ranges of glucose level fasting, after meals and with exercise. Acknowledge that pre and post exercise glucose checks will be done for 3 sessions at entry of program.   Know Your Numbers and Risk Factors: -Group verbal and written instruction about important numbers in your health.  Discussion of what are risk factors and how they play a role in the disease process.  Review of Cholesterol, Blood Pressure, Diabetes, and BMI and the role they play in your overall health.   Sleep Hygiene: -Provides group verbal and written instruction about how sleep can affect your health.  Define sleep hygiene, discuss sleep cycles and impact of sleep habits. Review  good sleep hygiene tips.    Other: -Provides group and verbal instruction on various topics (see comments)   Knowledge Questionnaire Score: Knowledge Questionnaire Score - 07/08/17 1215      Knowledge Questionnaire Score   Pre Score  21/28 correct answers reviewed with Pamala Hurry       Core Components/Risk Factors/Patient Goals at Admission: Personal Goals and Risk Factors at Admission - 07/08/17 1454      Core Components/Risk Factors/Patient Goals on Admission    Weight Management  Yes;Obesity    Intervention  Weight Management: Develop a combined nutrition and exercise program designed to reach desired caloric intake, while maintaining appropriate intake of nutrient and fiber, sodium and fats, and appropriate energy expenditure required for the weight goal.;Weight Management: Provide education and appropriate resources to help participant work on and attain dietary goals.;Weight Management/Obesity: Establish reasonable short term and long term weight goals.;Obesity: Provide education and appropriate resources to help participant work on and attain dietary goals.    Admit Weight  185 lb (83.9 kg)    Goal Weight: Short Term  180 lb (81.6 kg)    Goal Weight: Long Term  160 lb (72.6 kg)    Expected Outcomes  Short Term: Continue to assess and modify interventions until short term weight is achieved;Long Term:  Adherence to nutrition and physical activity/exercise program aimed toward attainment of established weight goal;Weight Loss: Understanding of general recommendations for a balanced deficit meal plan, which promotes 1-2 lb weight loss per week and includes a negative energy balance of [phone] kcal/d;Understanding recommendations for meals to include 15-35% energy as protein, 25-35% energy from fat, 35-60% energy from carbohydrates, less than 266m of dietary cholesterol, 20-35 gm of total fiber daily;Understanding of distribution of calorie intake throughout the day with the consumption of  4-5 meals/snacks    Hypertension  Yes    Intervention  Provide education on lifestyle modifcations including regular physical activity/exercise, weight management, moderate sodium restriction and increased consumption of fresh fruit, vegetables, and low fat dairy, alcohol moderation, and smoking cessation.;Monitor prescription use compliance.    Expected Outcomes  Short Term: Continued assessment and intervention until BP is < 140/972mHG in hypertensive participants. < 130/8071mG in hypertensive participants with diabetes, heart failure or chronic kidney disease.;Long Term: Maintenance of blood pressure at goal levels.    Lipids  Yes    Intervention  Provide education and support for participant on nutrition & aerobic/resistive exercise along with prescribed medications to achieve LDL <58m30mDL >40mg60m Expected Outcomes  Short Term: Participant states understanding of desired cholesterol values and is compliant with medications prescribed. Participant is following exercise prescription and nutrition guidelines.;Long Term: Cholesterol controlled with medications as prescribed, with individualized exercise RX and with personalized nutrition plan. Value goals: LDL < 58mg,91m > 40 mg.    Stress  Yes Bar[place] she is experiencing financial stress. She and her 95 yea23old husband have had a lot of health issues over the last couple years. She is thinking of going back to work     IntervNurse, mental healthidual and/or small group education and counseling on adjustment to heart disease, stress management and health-related lifestyle change. Teach and support self-help strategies.;Refer participants experiencing significant psychosocial distress to appropriate mental health specialists for further evaluation and treatment. When possible, include family members and significant others in education/counseling sessions.    Expected Outcomes  Short Term: Participant demonstrates changes in health-related  behavior, relaxation and other stress management skills, ability to obtain effective social support, and compliance with psychotropic medications if prescribed.;Long Term: Emotional wellbeing is indicated by absence of clinically significant psychosocial distress or social isolation.       Core Components/Risk Factors/Patient Goals Review:    Core Components/Risk Factors/Patient Goals at Discharge (Final Review):    ITP Comments: ITP Comments    Row Name 07/08/17 1452 07/21/17 0632         ITP Comments  Med Review completed. Initial ITP created. [facility]  [facility] Day review. Continue with ITP unless directed changes per Medical Director review.   New to program         Comments:

## 2017-07-21 NOTE — Progress Notes (Signed)
Daily Session Note  Patient Details  Name: Margaret [name] MRN: [id number] Date of Birth: 1940-11-12 Referring Provider:     Cardiac Rehab from 07/08/2017 in [facility] Cardiac and Pulmonary Rehab  Referring Provider  Arida      Encounter Date: 07/21/2017  Check In: Session Check In - 07/21/17 1709      Check-In   Location  [facility]-Cardiac & Pulmonary Rehab    Staff Present  Renita Papa, RN Vickki Hearing, BA, ACSM CEP, Exercise Physiologist;Carroll Enterkin, RN, BSN    Supervising physician immediately available to respond to emergencies  See telemetry face sheet for immediately available ER MD    Medication changes reported      No    Fall or balance concerns reported     No    Warm-up and Cool-down  Performed on first and last piece of equipment    Resistance Training Performed  Yes    VAD Patient?  No      Pain Assessment   Currently in Pain?  No/denies          Social History   Tobacco Use  Smoking Status Former Smoker  . Types: Cigarettes  . Last attempt to quit: 06/22/1984  . Years since quitting: 33.1  Smokeless Tobacco Never Used    Goals Met:  Independence with exercise equipment Exercise tolerated well No report of cardiac concerns or symptoms Strength training completed today  Goals Unmet:  Not Applicable  Comments: Pt able to follow exercise prescription today without complaint.  Will continue to monitor for progression.    Dr. Emily Filbert is Medical Director for [place] and [facility].

## 2017-07-22 DIAGNOSIS — I252 Old myocardial infarction: Secondary | ICD-10-CM | POA: Diagnosis not present

## 2017-07-22 DIAGNOSIS — I214 Non-ST elevation (NSTEMI) myocardial infarction: Secondary | ICD-10-CM

## 2017-07-22 NOTE — Progress Notes (Signed)
Daily Session Note  Patient Details  Name: Margaret [name] MRN: [id number] Date of Birth: February 07, 1941 Referring Provider:     Cardiac Rehab from 07/08/2017 in [facility] Cardiac and Pulmonary Rehab  Referring Provider  Arida      Encounter Date: 07/22/2017  Check In: Session Check In - 07/22/17 1716      Check-In   Location  [facility]-Cardiac & Pulmonary Rehab    Staff Present  Earlean Shawl, BS, ACSM CEP, Exercise Physiologist;Meredith Sherryll Burger, RN BSN;Marsheila Alejo Flavia Shipper    Supervising physician immediately available to respond to emergencies  See telemetry face sheet for immediately available ER MD    Medication changes reported      No    Fall or balance concerns reported     No    Warm-up and Cool-down  Performed on first and last piece of equipment    Resistance Training Performed  Yes    VAD Patient?  No      Pain Assessment   Currently in Pain?  No/denies          Social History   Tobacco Use  Smoking Status Former Smoker  . Types: Cigarettes  . Last attempt to quit: 06/22/1984  . Years since quitting: 33.1  Smokeless Tobacco Never Used    Goals Met:  Independence with exercise equipment Exercise tolerated well No report of cardiac concerns or symptoms Strength training completed today  Goals Unmet:  Not Applicable  Comments: Pt able to follow exercise prescription today without complaint.  Will continue to monitor for progression.   Dr. Emily Filbert is Medical Director for [place] and [facility].

## 2017-07-26 ENCOUNTER — Encounter: Payer: PPO | Attending: Cardiovascular Disease | Admitting: *Deleted

## 2017-07-26 DIAGNOSIS — E785 Hyperlipidemia, unspecified: Secondary | ICD-10-CM | POA: Insufficient documentation

## 2017-07-26 DIAGNOSIS — Z79899 Other long term (current) drug therapy: Secondary | ICD-10-CM | POA: Insufficient documentation

## 2017-07-26 DIAGNOSIS — Z86010 Personal history of colonic polyps: Secondary | ICD-10-CM | POA: Diagnosis not present

## 2017-07-26 DIAGNOSIS — F419 Anxiety disorder, unspecified: Secondary | ICD-10-CM | POA: Diagnosis not present

## 2017-07-26 DIAGNOSIS — Z7982 Long term (current) use of aspirin: Secondary | ICD-10-CM | POA: Diagnosis not present

## 2017-07-26 DIAGNOSIS — Z87891 Personal history of nicotine dependence: Secondary | ICD-10-CM | POA: Insufficient documentation

## 2017-07-26 DIAGNOSIS — Z7902 Long term (current) use of antithrombotics/antiplatelets: Secondary | ICD-10-CM | POA: Diagnosis not present

## 2017-07-26 DIAGNOSIS — I2510 Atherosclerotic heart disease of native coronary artery without angina pectoris: Secondary | ICD-10-CM | POA: Diagnosis not present

## 2017-07-26 DIAGNOSIS — I252 Old myocardial infarction: Secondary | ICD-10-CM | POA: Insufficient documentation

## 2017-07-26 DIAGNOSIS — F329 Major depressive disorder, single episode, unspecified: Secondary | ICD-10-CM | POA: Insufficient documentation

## 2017-07-26 DIAGNOSIS — I10 Essential (primary) hypertension: Secondary | ICD-10-CM | POA: Insufficient documentation

## 2017-07-26 DIAGNOSIS — K219 Gastro-esophageal reflux disease without esophagitis: Secondary | ICD-10-CM | POA: Insufficient documentation

## 2017-07-26 DIAGNOSIS — Z955 Presence of coronary angioplasty implant and graft: Secondary | ICD-10-CM | POA: Diagnosis not present

## 2017-07-26 DIAGNOSIS — I214 Non-ST elevation (NSTEMI) myocardial infarction: Secondary | ICD-10-CM

## 2017-07-26 DIAGNOSIS — Z8673 Personal history of transient ischemic attack (TIA), and cerebral infarction without residual deficits: Secondary | ICD-10-CM | POA: Insufficient documentation

## 2017-07-26 NOTE — Progress Notes (Signed)
Daily Session Note  Patient Details  Name: Renate Danh MRN: [id number] Date of Birth: 11-03-1940 Referring Provider:     Cardiac Rehab from 07/08/2017 in [facility] Cardiac and Pulmonary Rehab  Referring Provider  Arida      Encounter Date: 07/26/2017  Check In: Session Check In - 07/26/17 1626      Check-In   Location  [facility]Cardiac & Pulmonary Rehab    Staff Present  Earlean Shawl, BS, ACSM CEP, Exercise Physiologist;Amanda Oletta Darter, BA, ACSM CEP, Exercise Physiologist;Carroll Enterkin, RN, BSN    Supervising physician immediately available to respond to emergencies  See telemetry face sheet for immediately available ER MD    Medication changes reported      No    Fall or balance concerns reported     No    Warm-up and Cool-down  Performed on first and last piece of equipment    Resistance Training Performed  Yes    VAD Patient?  No      Pain Assessment   Currently in Pain?  No/denies    Multiple Pain Sites  No          Social History   Tobacco Use  Smoking Status Former Smoker  . Types: Cigarettes  . Last attempt to quit: 06/22/1984  . Years since quitting: 33.1  Smokeless Tobacco Never Used    Goals Met:  Independence with exercise equipment Exercise tolerated well No report of cardiac concerns or symptoms Strength training completed today  Goals Unmet:  Not Applicable  Comments: Pt able to follow exercise prescription today without complaint.  Will continue to monitor for progression.    Dr. Emily Filbert is Medical Director for [place] and [facility].

## 2017-07-28 ENCOUNTER — Encounter: Payer: PPO | Admitting: *Deleted

## 2017-07-28 DIAGNOSIS — I252 Old myocardial infarction: Secondary | ICD-10-CM | POA: Diagnosis not present

## 2017-07-28 DIAGNOSIS — I214 Non-ST elevation (NSTEMI) myocardial infarction: Secondary | ICD-10-CM

## 2017-07-28 DIAGNOSIS — Z955 Presence of coronary angioplasty implant and graft: Secondary | ICD-10-CM

## 2017-07-28 NOTE — Progress Notes (Signed)
Daily Session Note  Patient Details  Name: Margaret [name] MRN: [id number] Date of Birth: Nov 18, 1940 Referring Provider:     Cardiac Rehab from 07/08/2017 in [facility] Cardiac and Pulmonary Rehab  Referring Provider  Arida      Encounter Date: 07/28/2017  Check In: Session Check In - 07/28/17 1631      Check-In   Location  [facility]-Cardiac & Pulmonary Rehab    Staff Present  Renita Papa, RN Vickki Hearing, BA, ACSM CEP, Exercise Physiologist;Carroll Enterkin, RN, BSN    Supervising physician immediately available to respond to emergencies  See telemetry face sheet for immediately available ER MD    Medication changes reported      No    Fall or balance concerns reported     No    Warm-up and Cool-down  Performed on first and last piece of equipment    Resistance Training Performed  Yes    VAD Patient?  No      Pain Assessment   Currently in Pain?  No/denies        Exercise Prescription Changes - 07/28/17 1200      Response to Exercise   Blood Pressure (Admit)  122/68    Blood Pressure (Exercise)  168/82    Blood Pressure (Exit)  130/80    Heart Rate (Admit)  81 bpm    Heart Rate (Exercise)  104 bpm    Heart Rate (Exit)  69 bpm    Rating of Perceived Exertion (Exercise)  11    Symptoms  none    Duration  Progress to 45 minutes of aerobic exercise without signs/symptoms of physical distress    Intensity  THRR unchanged      Progression   Progression  Continue to progress workloads to maintain intensity without signs/symptoms of physical distress.    Average METs  2.3      Resistance Training   Training Prescription  Yes    Weight  3 lb    Reps  10-15      Interval Training   Interval Training  No      Treadmill   MPH  2    Grade  0    Minutes  15    METs  2.5      NuStep   Level  2    SPM  80    Minutes  15    METs  2.1       Social History   Tobacco Use  Smoking Status Former Smoker  . Types: Cigarettes  . Last attempt to quit: 06/22/1984  . Years  since quitting: 33.1  Smokeless Tobacco Never Used    Goals Met:  Independence with exercise equipment Exercise tolerated well No report of cardiac concerns or symptoms Strength training completed today  Goals Unmet:  Not Applicable  Comments: Pt able to follow exercise prescription today without complaint.  Will continue to monitor for progression.    Dr. Emily Filbert is Medical Director for [place] and [facility].

## 2017-07-29 ENCOUNTER — Other Ambulatory Visit: Payer: Self-pay | Admitting: Family Medicine

## 2017-07-29 DIAGNOSIS — Z0000 Encounter for general adult medical examination without abnormal findings: Secondary | ICD-10-CM

## 2017-07-29 DIAGNOSIS — I214 Non-ST elevation (NSTEMI) myocardial infarction: Secondary | ICD-10-CM

## 2017-07-29 DIAGNOSIS — I252 Old myocardial infarction: Secondary | ICD-10-CM | POA: Diagnosis not present

## 2017-07-29 NOTE — Progress Notes (Signed)
Daily Session Note  Patient Details  Name: Margaret [name] MRN: [id number] Date of Birth: 02/06/1941 Referring Provider:     Cardiac Rehab from 07/08/2017 in [facility] Cardiac and Pulmonary Rehab  Referring Provider  Arida      Encounter Date: 07/29/2017  Check In: Session Check In - 07/29/17 1635      Check-In   Location  [facility]Cardiac [facility] Re[facility]    Staff Present  Earlean Shawl, BS, ACSM CEP, Exercise Physiologist;Meredith Sherryll Burger, RN BSN;Joseph Flavia Shipper    Supervising physician immediately available to respond to emergencies  See telemetry face sheet for immediately available ER MD    Medication changes reported      No    Fall or balance concerns reported     No    Warm-up and Cool-down  Performed on first and last piece of equipment    Resistance Training Performed  Yes    VAD Patient?  No      Pain Assessment   Currently in Pain?  No/denies          Social History   Tobacco Use  Smoking Status Former Smoker  . Types: Cigarettes  . Last attempt to quit: 06/22/1984  . Years since quitting: 33.1  Smokeless Tobacco Never Used    Goals Met:  Independence with exercise equipment Exercise tolerated well No report of cardiac concerns or symptoms Strength training completed today  Goals Unmet:  Not Applicable  Comments: Pt able to follow exercise prescription today without complaint.  Will continue to monitor for progression.   Dr. Emily Filbert is Medical Director for [place] and [facility].

## 2017-07-30 ENCOUNTER — Ambulatory Visit
Admission: RE | Admit: 2017-07-30 | Discharge: 2017-07-30 | Disposition: A | Discharge status: Home / Self Care | Payer: PPO | Source: Ambulatory Visit | Attending: Family Medicine | Admitting: Family Medicine

## 2017-07-30 DIAGNOSIS — Z1231 Encounter for screening mammogram for malignant neoplasm of breast: Secondary | ICD-10-CM | POA: Diagnosis not present

## 2017-07-30 DIAGNOSIS — Z0000 Encounter for general adult medical examination without abnormal findings: Secondary | ICD-10-CM

## 2017-08-02 ENCOUNTER — Encounter: Payer: PPO | Admitting: *Deleted

## 2017-08-02 DIAGNOSIS — I252 Old myocardial infarction: Secondary | ICD-10-CM | POA: Diagnosis not present

## 2017-08-02 DIAGNOSIS — Z955 Presence of coronary angioplasty implant and graft: Secondary | ICD-10-CM

## 2017-08-02 DIAGNOSIS — I214 Non-ST elevation (NSTEMI) myocardial infarction: Secondary | ICD-10-CM

## 2017-08-02 NOTE — Progress Notes (Signed)
Daily Session Note  Patient Details  Name: Margaret [name] MRN: [id number] Date of Birth: 01/13/1941 Referring Provider:     Cardiac Rehab from 07/08/2017 in [facility] Cardiac and Pulmonary Rehab  Referring Provider  Arida      Encounter Date: 08/02/2017  Check In: Session Check In - 08/02/17 1715      Check-In   Location  [facility]Cardiac & Pulmonary Rehab    Staff Present  Kelly Hayes, BS, ACSM CEP, Exercise Physiologist;Amanda Sommer, BA, ACSM CEP, Exercise Physiologist;Mary Jo Abernethy, RN, BSN, MA    Supervising physician immediately available to respond to emergencies  See telemetry face sheet for immediately available ER MD    Medication changes reported      No    Fall or balance concerns reported     No    Warm-up and Cool-down  Performed on first and last piece of equipment    Resistance Training Performed  Yes    VAD Patient?  No      Pain Assessment   Currently in Pain?  No/denies    Multiple Pain Sites  No          Social History   Tobacco Use  Smoking Status Former Smoker  . Types: Cigarettes  . Last attempt to quit: 06/22/1984  . Years since quitting: 33.1  Smokeless Tobacco Never Used    Goals Met:  Independence with exercise equipment Exercise tolerated well No report of cardiac concerns or symptoms Strength training completed today  Goals Unmet:  Not Applicable  Comments: Pt able to follow exercise prescription today without complaint.  Will continue to monitor for progression.    Dr. Mark Miller is Medical Director for [facility] and [facility]. 

## 2017-08-04 ENCOUNTER — Encounter: Payer: PPO | Admitting: *Deleted

## 2017-08-04 DIAGNOSIS — I252 Old myocardial infarction: Secondary | ICD-10-CM | POA: Diagnosis not present

## 2017-08-04 DIAGNOSIS — I214 Non-ST elevation (NSTEMI) myocardial infarction: Secondary | ICD-10-CM

## 2017-08-04 DIAGNOSIS — Z955 Presence of coronary angioplasty implant and graft: Secondary | ICD-10-CM

## 2017-08-04 NOTE — Progress Notes (Signed)
Daily Session Note  Patient Details  Name: Margaret [name] MRN: [id number] Date of Birth: 1940-08-24 Referring Provider:     Cardiac Rehab from 07/08/2017 in [facility] Cardiac and Pulmonary Rehab  Referring Provider  Arida      Encounter Date: 08/04/2017  Check In: Session Check In - 08/04/17 1651      Check-In   Location  [facility]-Cardiac & Pulmonary Rehab    Staff Present  Renita Papa, RN Vickki Hearing, BA, ACSM CEP, Exercise Physiologist;Carroll Enterkin, RN, BSN    Supervising physician immediately available to respond to emergencies  See telemetry face sheet for immediately available ER MD    Medication changes reported      No    Fall or balance concerns reported     No    Warm-up and Cool-down  Performed on first and last piece of equipment    Resistance Training Performed  Yes    VAD Patient?  No      Pain Assessment   Currently in Pain?  No/denies          Social History   Tobacco Use  Smoking Status Former Smoker  . Types: Cigarettes  . Last attempt to quit: 06/22/1984  . Years since quitting: 33.1  Smokeless Tobacco Never Used    Goals Met:  Independence with exercise equipment Exercise tolerated well No report of cardiac concerns or symptoms Strength training completed today  Goals Unmet:  Not Applicable  Comments: Pt able to follow exercise prescription today without complaint.  Will continue to monitor for progression.    Dr. Emily Filbert is Medical Director for [place] and [facility].

## 2017-08-05 DIAGNOSIS — I252 Old myocardial infarction: Secondary | ICD-10-CM | POA: Diagnosis not present

## 2017-08-05 DIAGNOSIS — I214 Non-ST elevation (NSTEMI) myocardial infarction: Secondary | ICD-10-CM

## 2017-08-05 NOTE — Progress Notes (Signed)
Daily Session Note  Patient Details  Name: Margaret [name] MRN: [id number] Date of Birth: 09-30-1940 Referring Provider:     Cardiac Rehab from 07/08/2017 in [facility] Cardiac and Pulmonary Rehab  Referring Provider  Arida      Encounter Date: 08/05/2017  Check In: Session Check In - 08/05/17 1640      Check-In   Location  [facility]-Cardiac & Pulmonary Rehab    Staff Present  Earlean Shawl, BS, ACSM CEP, Exercise Physiologist;Meredith Sherryll Burger, RN BSN;Isabella Roemmich Flavia Shipper    Supervising physician immediately available to respond to emergencies  See telemetry face sheet for immediately available ER MD    Medication changes reported      No    Fall or balance concerns reported     No    Warm-up and Cool-down  Performed on first and last piece of equipment    Resistance Training Performed  Yes    VAD Patient?  No      Pain Assessment   Currently in Pain?  No/denies          Social History   Tobacco Use  Smoking Status Former Smoker  . Types: Cigarettes  . Last attempt to quit: 06/22/1984  . Years since quitting: 33.1  Smokeless Tobacco Never Used    Goals Met:  Independence with exercise equipment Exercise tolerated well Personal goals reviewed No report of cardiac concerns or symptoms Strength training completed today  Goals Unmet:  Not Applicable  Comments: Reviewed home exercise with pt today.  Pt plans to play tennis 3 times a week and walk for exercise.  Reviewed THR, pulse, RPE, sign and symptoms, NTG use, and when to call 911 or MD.  Also discussed weather considerations and indoor options.  Pt voiced understanding.   Dr. Emily Filbert is Medical Director for [place] and [facility].

## 2017-08-09 DIAGNOSIS — I214 Non-ST elevation (NSTEMI) myocardial infarction: Secondary | ICD-10-CM

## 2017-08-09 DIAGNOSIS — Z955 Presence of coronary angioplasty implant and graft: Secondary | ICD-10-CM

## 2017-08-09 DIAGNOSIS — I252 Old myocardial infarction: Secondary | ICD-10-CM | POA: Diagnosis not present

## 2017-08-09 NOTE — Progress Notes (Signed)
Daily Session Note  Patient Details  Name: Margaret [name] MRN: [id number] Date of Birth: Oct 18, 1940 Referring Provider:     Cardiac Rehab from 07/08/2017 in [facility] Cardiac and Pulmonary Rehab  Referring Provider  Arida      Encounter Date: 08/09/2017  Check In: Session Check In - 08/09/17 1622      Check-In   Location  [facility]Cardiac [facility]monary Re[facility]    Staff Present  Earlean Shawl, BS, ACSM CEP, Exercise Physiologist;Amanda Oletta Darter, BA, ACSM CEP, Exercise Physiologist;Carroll Enterkin, RN, BSN    Supervising physician immediately available to respond to emergencies  See telemetry face sheet for immediately available ER MD    Medication changes reported      No    Fall or balance concerns reported     No    Warm-up and Cool-down  Performed on first and last piece of equipment    Resistance Training Performed  Yes    VAD Patient?  No      Pain Assessment   Currently in Pain?  No/denies    Multiple Pain Sites  No          Social History   Tobacco Use  Smoking Status Former Smoker  . Types: Cigarettes  . Last attempt to quit: 06/22/1984  . Years since quitting: 33.1  Smokeless Tobacco Never Used    Goals Met:  Independence with exercise equipment Exercise tolerated well No report of cardiac concerns or symptoms Strength training completed today  Goals Unmet:  Not Applicable  Comments: Pt able to follow exercise prescription today without complaint.  Will continue to monitor for progression.    Dr. Emily Filbert is Medical Director for [place] and [facility].

## 2017-08-11 ENCOUNTER — Encounter: Payer: PPO | Admitting: *Deleted

## 2017-08-11 DIAGNOSIS — Z955 Presence of coronary angioplasty implant and graft: Secondary | ICD-10-CM

## 2017-08-11 DIAGNOSIS — I214 Non-ST elevation (NSTEMI) myocardial infarction: Secondary | ICD-10-CM

## 2017-08-11 NOTE — Progress Notes (Signed)
Daily Session Note  Patient Details  Name: Margaret [name] MRN: [id number] Date of Birth: Jul 15, 1940 Referring Provider:     Cardiac Rehab from 07/08/2017 in [facility] Cardiac and Pulmonary Rehab  Referring Provider  Arida      Encounter Date: 08/11/2017  Check In: Session Check In - 08/11/17 1630      Check-In   Staff Present  Renita Papa, RN Vickki Hearing, BA, ACSM CEP, Exercise Physiologist;Carroll Enterkin, RN, BSN    Supervising physician immediately available to respond to emergencies  See telemetry face sheet for immediately available ER MD    Medication changes reported      No    Fall or balance concerns reported     No    Warm-up and Cool-down  Performed on first and last piece of equipment    Resistance Training Performed  Yes    VAD Patient?  No      Pain Assessment   Currently in Pain?  No/denies          Social History   Tobacco Use  Smoking Status Former Smoker  . Types: Cigarettes  . Last attempt to quit: 06/22/1984  . Years since quitting: 33.1  Smokeless Tobacco Never Used    Goals Met:  Independence with exercise equipment Exercise tolerated well No report of cardiac concerns or symptoms Strength training completed today  Goals Unmet:  Not Applicable  Comments: Pt able to follow exercise prescription today without complaint.  Will continue to monitor for progression.    Dr. Emily Filbert is Medical Director for [place] and [facility].

## 2017-08-12 DIAGNOSIS — I214 Non-ST elevation (NSTEMI) myocardial infarction: Secondary | ICD-10-CM

## 2017-08-12 DIAGNOSIS — I252 Old myocardial infarction: Secondary | ICD-10-CM | POA: Diagnosis not present

## 2017-08-12 NOTE — Progress Notes (Signed)
Daily Session Note  Patient Details  Name: Neda Willenbring MRN: [id number] Date of Birth: 1940-07-16 Referring Provider:     Cardiac Rehab from 07/08/2017 in [facility] Cardiac and Pulmonary Rehab  Referring Provider  Arida      Encounter Date: 08/12/2017  Check In: Session Check In - 08/12/17 1741      Check-In   Location  [facility]-Cardiac & Pulmonary Rehab    Staff Present  Nada Maclachlan, BA, ACSM CEP, Exercise Physiologist;Pellegrino Kennard Alcus Dad, RN Moises Blood, [place], ACSM CEP, Exercise Physiologist    Supervising physician immediately available to respond to emergencies  See telemetry face sheet for immediately available ER MD    Medication changes reported      No    Fall or balance concerns reported     No    Warm-up and Cool-down  Performed on first and last piece of equipment    Resistance Training Performed  Yes    VAD Patient?  No      Pain Assessment   Currently in Pain?  No/denies          Social History   Tobacco Use  Smoking Status Former Smoker  . Types: Cigarettes  . Last attempt to quit: 06/22/1984  . Years since quitting: 33.1  Smokeless Tobacco Never Used    Goals Met:  Independence with exercise equipment Exercise tolerated well Personal goals reviewed No report of cardiac concerns or symptoms Strength training completed today  Goals Unmet:  Not Applicable  Comments: Pt able to follow exercise prescription today without complaint.  Will continue to monitor for progression.   Dr. Emily Filbert is Medical Director for [place] and [facility].

## 2017-08-16 ENCOUNTER — Encounter: Payer: PPO | Admitting: *Deleted

## 2017-08-16 DIAGNOSIS — Z955 Presence of coronary angioplasty implant and graft: Secondary | ICD-10-CM

## 2017-08-16 DIAGNOSIS — I214 Non-ST elevation (NSTEMI) myocardial infarction: Secondary | ICD-10-CM

## 2017-08-16 DIAGNOSIS — I252 Old myocardial infarction: Secondary | ICD-10-CM | POA: Diagnosis not present

## 2017-08-16 NOTE — Progress Notes (Signed)
Daily Session Note  Patient Details  Name: Margaret [name] MRN: [id number] Date of Birth: [birth year] Referring Provider:     Cardiac Rehab from 07/08/2017 in [facility] Cardiac and Pulmonary Rehab  Referring Provider  Arida      Encounter Date: 08/16/2017  Check In: Session Check In - 08/16/17 1707      Check-In   Location  [facility]Cardiac [facility] Pulmonary Rehab    Staff Present  Earlean Shawl, BS, ACSM CEP, Exercise Physiologist;Amanda Oletta Darter, BA, ACSM CEP, Exercise Physiologist;Meredith Sherryll Burger, RN BSN;Susanne Bice, RN, BSN, Florestine Avers, RN BSN    Supervising physician immediately available to respond to emergencies  See telemetry face sheet for immediately available ER MD    Medication changes reported      No    Fall or balance concerns reported     No    Warm-up and Cool-down  Performed on first and last piece of equipment    Resistance Training Performed  Yes    VAD Patient?  No      Pain Assessment   Currently in Pain?  No/denies    Multiple Pain Sites  No          Social History   Tobacco Use  Smoking Status Former Smoker  . Types: Cigarettes  . Last attempt to quit: 06/22/1984  . Years since quitting: 33.1  Smokeless Tobacco Never Used    Goals Met:  Independence with exercise equipment Personal goals reviewed No report of cardiac concerns or symptoms Strength training completed today  Goals Unmet:  Not Applicable  Comments: Pt able to follow exercise prescription today without complaint.  Will continue to monitor for progression.    Dr. Emily Filbert is Medical Director for [place] and [facility].

## 2017-08-18 ENCOUNTER — Encounter: Payer: Self-pay | Admitting: *Deleted

## 2017-08-18 ENCOUNTER — Encounter: Payer: PPO | Admitting: *Deleted

## 2017-08-18 ENCOUNTER — Other Ambulatory Visit: Payer: Self-pay | Admitting: *Deleted

## 2017-08-18 DIAGNOSIS — Z955 Presence of coronary angioplasty implant and graft: Secondary | ICD-10-CM

## 2017-08-18 DIAGNOSIS — I214 Non-ST elevation (NSTEMI) myocardial infarction: Secondary | ICD-10-CM

## 2017-08-18 DIAGNOSIS — I252 Old myocardial infarction: Secondary | ICD-10-CM | POA: Diagnosis not present

## 2017-08-18 MED ORDER — TICAGRELOR 90 MG PO TABS: 90.0000 mg | ORAL_TABLET | Freq: Two times a day (BID) | ORAL | 5 refills | Status: DC

## 2017-08-18 NOTE — Progress Notes (Signed)
Cardiac Individual Treatment Plan  Patient Details  Name: Rogena Deupree MRN: [id number] Date of Birth: 20-Feb-1941 Referring Provider:     Cardiac Rehab from 07/08/2017 in [facility] Cardiac and Pulmonary Rehab  Referring Provider  Arida      Initial Encounter Date:    Cardiac Rehab from 07/08/2017 in [facility] Cardiac and Pulmonary Rehab  Date  07/08/17  Referring Provider  Fletcher Anon      Visit Diagnosis: NSTEMI (non-ST elevated myocardial infarction) [facility])  Status post coronary artery stent placement  Patient's Home Medications on Admission:  Current Outpatient Medications:  .  amLODipine (NORVASC) 5 MG tablet, Take 1 tablet (5 mg total) by mouth daily., Disp: 90 tablet, Rfl: 3 .  aspirin EC 81 MG tablet, Take 1 tablet (81 mg total) by mouth daily., Disp: 90 tablet, Rfl: 3 .  atorvastatin (LIPITOR) 80 MG tablet, Take 80 mg by mouth daily., Disp: , Rfl:  .  carvedilol (COREG) 6.25 MG tablet, Take 1 tablet (6.25 mg total) by mouth 2 (two) times daily with a meal., Disp: 60 tablet, Rfl: 1 .  Docusate Calcium (STOOL SOFTENER PO), Take by mouth. As needed, Disp: , Rfl:  .  ezetimibe (ZETIA) 10 MG tablet, Take 1 tablet (10 mg total) by mouth daily., Disp: 30 tablet, Rfl: 5 .  losartan (COZAAR) 50 MG tablet, Take 1 tablet (50 mg total) by mouth daily., Disp: 30 tablet, Rfl: 1 .  nitroGLYCERIN (NITROSTAT) 0.4 MG SL tablet, Place 1 tablet (0.4 mg total) under the tongue every 5 (five) minutes as needed for chest pain., Disp: 30 tablet, Rfl: 2 .  pantoprazole (PROTONIX) 40 MG tablet, Take 40 mg by mouth daily., Disp: , Rfl:  .  sertraline (ZOLOFT) 25 MG tablet, Take 25 mg by mouth daily., Disp: , Rfl:  .  ticagrelor (BRILINTA) 90 MG TABS tablet, Take 1 tablet (90 mg total) by mouth 2 (two) times daily., Disp: 60 tablet, Rfl: 1  Past Medical History: Past Medical History:  Diagnosis Date  . Anxiety and depression   . CAD (coronary artery disease)   . Cellulitis of right leg 2005  . Colon polyp   .  Dyspnea on exertion   . GERD (gastroesophageal reflux disease)   . Hyperlipidemia   . Hypertension   . Morbid obesity ([place])   . Presence of stent in right coronary artery 02/08/12   2.5x18 Xience distal RCA  . Right shoulder pain   . Stroke ([place])   . Vaginal prolapse     Tobacco Use: Social History   Tobacco Use  Smoking Status Former Smoker  . Types: Cigarettes  . Last attempt to quit: 06/22/1984  . Years since quitting: 33.1  Smokeless Tobacco Never Used    Labs: Recent Review [facility] Cardiac and Pulmonary Rehab Latest Ref Rng & Units 06/15/2017   Cholestrol 0 - 200 mg/dL 225(H)   LDLCALC 0 - 99 mg/dL 146(H)   HDL >40 mg/dL 60   Trlycerides <150 mg/dL 96       Exercise Target Goals:    Exercise Program Goal: Individual exercise prescription set using results from initial 6 min walk test and THRR while considering  patient's activity barriers and safety.   Exercise Prescription Goal: Initial exercise prescription builds to 30-45 minutes a day of aerobic activity, 2-3 days per week.  Home exercise guidelines will be given to patient during program as part of exercise prescription that the participant will acknowledge.  Activity Barriers &  Risk Stratification: Activity Barriers & Cardiac Risk Stratification - 07/08/17 1502      Activity Barriers & Cardiac Risk Stratification   Activity Barriers  Joint Problems left knee pain, receives injections for    Cardiac Risk Stratification  Moderate       6 Minute Walk: 6 Minute Walk    Row Name 07/08/17 1522         6 Minute Walk   Distance  1150 feet     Walk Time  6 minutes     # of Rest Breaks  0     MPH  2.18     METS  2.28     RPE  15     Perceived Dyspnea   0     VO2 Peak  7.99     Symptoms  No     Resting HR  55 bpm     Resting BP  128/62     Resting Oxygen Saturation   100 %     Exercise Oxygen Saturation  during 6 min walk  100 %     Max Ex. HR  102 bpm     Max Ex. BP  160/64      2 Minute Post BP  128/58        Oxygen Initial Assessment:   Oxygen Re-Evaluation:   Oxygen Discharge (Final Oxygen Re-Evaluation):   Initial Exercise Prescription: Initial Exercise Prescription - 07/08/17 1500      Date of Initial Exercise RX and Referring Provider   Date  07/08/17    Referring Provider  Arida      Treadmill   MPH  2    Grade  0    Minutes  15    METs  2.5      Recumbant Bike   Level  1    RPM  60    Watts  8    Minutes  15    METs  2.2      NuStep   Level  2    SPM  80    Minutes  15    METs  2.2      Prescription Details   Frequency (times per week)  3    Duration  Progress to 45 minutes of aerobic exercise without signs/symptoms of physical distress      Intensity   THRR 40-80% of Max Heartrate  89-125    Ratings of Perceived Exertion  11-13    Perceived Dyspnea  0-4      Resistance Training   Training Prescription  Yes    Weight  3 lb    Reps  10-15       Perform Capillary Blood Glucose checks as needed.  Exercise Prescription Changes: Exercise Prescription Changes    Row Name 07/08/17 1500 07/28/17 1200 08/05/17 1600 08/10/17 1100       Response to Exercise   Blood Pressure (Admit)  128/62  122/68  -  132/80    Blood Pressure (Exercise)  160/64  168/82  -  118/64    Blood Pressure (Exit)  128/58  130/80  -  122/80    Heart Rate (Admit)  68 bpm  81 bpm  -  69 bpm    Heart Rate (Exercise)  102 bpm  104 bpm  -  106 bpm    Heart Rate (Exit)  60 bpm  69 bpm  -  60 bpm    Oxygen Saturation (Admit)  100 %  -  -  -  Rating of Perceived Exertion (Exercise)  15  11  -  13    Symptoms  -  none  -  none    Duration  -  Progress to 45 minutes of aerobic exercise without signs/symptoms of physical distress  -  Continue with 45 min of aerobic exercise without signs/symptoms of physical distress.    Intensity  -  THRR unchanged  -  THRR unchanged      Progression   Progression  -  Continue to progress workloads to maintain  intensity without signs/symptoms of physical distress.  -  Continue to progress workloads to maintain intensity without signs/symptoms of physical distress.    Average METs  -  2.3  -  2.25      Resistance Training   Training Prescription  -  Yes  -  Yes    Weight  -  3 lb  -  3 lb    Reps  -  10-15  -  10-15      Interval Training   Interval Training  -  No  -  -      Treadmill   MPH  -  2  -  2    Grade  -  0  -  0    Minutes  -  15  -  15    METs  -  2.5  -  2.5      NuStep   Level  -  2  -  -    SPM  -  80  -  -    Minutes  -  15  -  -    METs  -  2.1  -  -      Biostep-RELP   Level  -  -  -  1    SPM  -  -  -  50    Minutes  -  -  -  15    METs  -  -  -  2      Home Exercise Plan   Plans to continue exercise at  -  -  [employer] (comment) tennis and walking at home  [employer] (comment) tennis and walking at home    Frequency  -  -  Add 3 additional days to program exercise sessions.  Add 3 additional days to program exercise sessions.    Initial Home Exercises Provided  -  -  08/05/17  08/05/17       Exercise Comments: Exercise Comments    Row Name 07/19/17 1626           Exercise Comments   First full day of exercise!  Patient was oriented to gym and equipment including functions, settings, policies, and procedures.  Patient's individual exercise prescription and treatment plan were reviewed.  All starting workloads were established based on the results of the 6 minute walk test done at initial orientation visit.  The plan for exercise progression was also introduced and progression will be customized based on patie          Exercise Goals and Review: Exercise Goals    Row Name 07/08/17 1522             Exercise Goals   Increase Physical Activity  Yes       Intervention  Provide advice, education, support and counseling about physical activity/exercise needs.;Develop an individualized exercise prescription for aerobic and resistive  training based on initial evaluation  findings, risk stratification, comorbidities and participant's personal goals.       Expected Outcomes  Short Term: Attend rehab on a regular basis to increase amount of physical activity.;Long Term: Add in home exercise to make exercise part of routine and to increase amount of physical activity.;Long Term: Exercising regularly at least 3-5 days a week.       Increase Strength and Stamina  Yes       Intervention  Provide advice, education, support and counseling about physical activity/exercise needs.;Develop an individualized exercise prescription for aerobic and resistive training based on initial evaluation findings, risk stratification, comorbidities and participant's personal goals.       Expected Outcomes  Short Term: Increase workloads from initial exercise prescription for resistance, speed, and METs.;Short Term: Perform resistance training exercises routinely during rehab and add in resistance training at home;Long Term: Improve cardiorespiratory fitness, muscular endurance and strength as measured by increased METs and functional capacity (6MWT)       Able to understand and use rate of perceived exertion (RPE) scale  Yes       Intervention  Provide education and explanation on how to use RPE scale       Expected Outcomes  Short Term: Able to use RPE daily in rehab to express subjective intensity level;Long Term:  Able to use RPE to guide intensity level when exercising independently       Able to understand and use Dyspnea scale  Yes       Intervention  Provide education and explanation on how to use Dyspnea scale       Expected Outcomes  Short Term: Able to use Dyspnea scale daily in rehab to express subjective sense of shortness of breath during exertion;Long Term: Able to use Dyspnea scale to guide intensity level when exercising independently       Knowledge and understanding of Target Heart Rate Range (THRR)  Yes       Intervention  Provide education  and explanation of THRR including how the numbers were predicted and where they are located for reference       Expected Outcomes  Short Term: Able to state/look up THRR;Long Term: Able to use THRR to govern intensity when exercising independently;Short Term: Able to use daily as guideline for intensity in rehab       Able to check pulse independently  Yes       Intervention  Provide education and demonstration on how to check pulse in carotid and radial arteries.;Review the importance of being able to check your own pulse for safety during independent exercise       Expected Outcomes  Short Term: Able to explain why pulse checking is important during independent exercise;Long Term: Able to check pulse independently and accurately       Understanding of Exercise Prescription  Yes       Intervention  Provide education, explanation, and written materials on patient's individual exercise prescription       Expected Outcomes  Short Term: Able to explain program exercise prescription;Long Term: Able to explain home exercise prescription to exercise independently          Exercise Goals Re-Evaluation : Exercise Goals Re-Evaluation    Row Name 07/19/17 1627 07/28/17 1232 08/05/17 1646 08/10/17 1117       Exercise Goal Re-Evaluation   Exercise Goals Review  Increase Physical Activity;Increase Strength and Stamina;Able to understand and use rate of perceived exertion (RPE) scale;Knowledge and understanding of Target Heart Rate Range (THRR);Understanding  of Exercise Prescription  Increase Physical Activity;Increase Strength and Stamina;Able to understand and use rate of perceived exertion (RPE) scale  Increase Physical Activity;Increase Strength and Stamina  Increase Physical Activity;Increase Strength and Stamina;Able to understand and use rate of perceived exertion (RPE) scale    Comments  Reviewed RPE scale, THR and program prescription with pt today.  Pt voiced understanding and was given a copy of goals  to take home.   Jasenia is tolerating exercise well.  Staff will continue to monitor.  Reviewed home exercise with pt today.  Pt plans to play tennis 3 times a week and walk for exercise.  Reviewed THR, pulse, RPE, sign and symptoms, NTG use, and when to call 911 or MD.  Also discussed weather considerations and indoor options.  Pt voiced understanding.  Vikki plays tennis and would like to get back to this by spring.  Her biggest obstacle at this time is chronic knee pain, although it doesnt hurt on the TM.  She does have a brace which she wears sometimes to exercise.  She is getting an injection soon.   She has very poor knee alignment as observed while she exercises.  Staff will continue to monitor     Expected Outcomes  Short - Use RPE daily to regulate intensity  Long - follow program prescription in [place] will attend regularly Long - Morna will increase MET level  Short - play tennis 3 days a week and walk at home. Long - maintain exercise independently  Short - Pt will continue to attend and will build strength and endurance in legs to help with knee pain Long - Pt will reach goal of getting back to tennis        Discharge Exercise Prescription (Final Exercise Prescription Changes): Exercise Prescription Changes - 08/10/17 1100      Response to Exercise   Blood Pressure (Admit)  132/80    Blood Pressure (Exercise)  118/64    Blood Pressure (Exit)  122/80    Heart Rate (Admit)  69 bpm    Heart Rate (Exercise)  106 bpm    Heart Rate (Exit)  60 bpm    Rating of Perceived Exertion (Exercise)  13    Symptoms  none    Duration  Continue with 45 min of aerobic exercise without signs/symptoms of physical distress.    Intensity  THRR unchanged      Progression   Progression  Continue to progress workloads to maintain intensity without signs/symptoms of physical distress.    Average METs  2.25      Resistance Training   Training Prescription  Yes    Weight  3 lb    Reps   10-15      Treadmill   MPH  2    Grade  0    Minutes  15    METs  2.5      Biostep-RELP   Level  1    SPM  50    Minutes  15    METs  2      Home Exercise Plan   Plans to continue exercise at  [employer] (comment) tennis and walking at home    Frequency  Add 3 additional days to program exercise sessions.    Initial Home Exercises Provided  08/05/17       Nutrition:  Target Goals: Understanding of nutrition guidelines, daily intake of sodium '1500mg'$ , cholesterol '200mg'$ , calories 30% from fat and 7% or  less from saturated fats, daily to have 5 or more servings of fruits and vegetables.  Biometrics: Pre Biometrics - 07/08/17 1521      Pre Biometrics   Height  '5\' 6"'$  (1.676 m)    Weight  185 lb 9.6 oz (84.2 kg)    Waist Circumference  37 inches    Hip Circumference  47 inches    Waist to Hip Ratio  0.79 %    BMI (Calculated)  29.97    Single Leg Stand  4.93 seconds        Nutrition Therapy Plan and Nutrition Goals:   Nutrition Assessments: Nutrition Assessments - 07/08/17 1457      MEDFICTS Scores   Pre Score  16       Nutrition Goals Re-Evaluation:   Nutrition Goals Discharge (Final Nutrition Goals Re-Evaluation):   Psychosocial: Target Goals: Acknowledge presence or absence of significant depression and/or stress, maximize coping skills, provide positive support system. Participant is able to verbalize types and ability to use techniques and skills needed for reducing stress and depression.   Initial Review & Psychosocial Screening: Initial Psych Review & Screening - 07/08/17 1457      Initial Review   Current issues with  Current Depression;Current Stress Concerns    Source of Stress Concerns  Unable to participate in former interests or hobbies;Financial    Comments  She and her husband are under financial stress related to their health problems. Her zoloft was recently increased. She said her depression currently is steming from "what could of  been"      [place]?  Yes      Screening Interventions   Interventions  Encouraged to exercise;Program counselor consult;To provide support and resources with identified psychosocial needs    Expected Outcomes  Short Term goal: Utilizing psychosocial counselor, staff and physician to assist with identification of specific Stressors or current issues interfering with healing process. Setting desired goal for each stressor or current issue identified.;Long Term Goal: Stressors or current issues are controlled or eliminated.;Short Term goal: Identification and review with participant of any Quality of Life or Depression concerns found by scoring the questionnaire.;Long Term goal: The participant improves quality of Life and PHQ9 Scores as seen by post scores and/or verbalization of changes       Quality of Life Scores:  Quality of Life - 07/08/17 1459      Quality of Life Scores   Health/Function Pre  20.4 %    Socioeconomic Pre  20.25 %    Psych/Spiritual Pre  20 %    Family Pre  20.8 %    GLOBAL Pre  20.34 %      Scores of 19 and below usually indicate a poorer quality of life in these areas.  A difference of  2-3 points is a clinically meaningful difference.  A difference of 2-3 points in the total score of the Quality of Life Index has been associated with significant improvement in overall quality of life, self-image, physical symptoms, and general health in studies assessing change in quality of life.  PHQ-9: Recent Review Flowsheet Data    Depression screen [facility] 2/9 08/11/2017 07/08/2017   Decreased Interest 1 2   Down, Depressed, Hopeless 1 1   PHQ - 2 Score 2 3   Altered sleeping 0 1   Tired, decreased energy 1 1   Change in appetite 0 0   Feeling bad or failure about yourself  2 3  Trouble concentrating 0 0   Moving slowly or fidgety/restless 0 0   Suicidal thoughts 0 0   PHQ-9 Score 5 8   Difficult doing work/chores Not difficult at all  Somewhat difficult     Interpretation of Total Score  Total Score Depression Severity:  1-4 = Minimal depression, 5-9 = Mild depression, 10-14 = Moderate depression, 15-19 = Moderately severe depression, 20-27 = Severe depression   Psychosocial Evaluation and Intervention: Psychosocial Evaluation - 07/19/17 1703      Psychosocial Evaluation & Interventions   Interventions  Encouraged to exercise with the program and follow exercise prescription;Relaxation education;Stress management education    Comments  Counselor met with Ms. [place] Pamala Hurry) for initial psychosocial evaluation.  She is a 77 year old who had a heart attack on Christmas Eve.  She has a strong support system with a spouse of over 96 years; a daughter who lives locally; good neighbors and some involvement in her local church.  Capucine had a stroke 1.5 years ago but has mostly recovered from this.  She sleeps well and has a fair appetite.  She denies a history of depression or anxiety or any current symptoms.  Her mood is generally stable and positive.  Paul reports having multiple stressors in her life at this time with her health; finances and conflict with her spouse at times who is a "control freak."  She has goals to increase her stamina and strength.  Natale reports her dog and playing tennis are her stress reducers currently in her life.  Staff will follow with her throughout the course of this program.      Expected Outcomes  Raiya will benefit from consistent exercise to achieve her stated goals.  The educational and psychoeducational components of this program will be helpful for understanding and coping more positively with her condition and the stress in her life currently.      Continue Psychosocial Services   Follow up required by staff       Psychosocial Re-Evaluation: Psychosocial Re-Evaluation    [place] Name 08/12/17 1652             Psychosocial Re-Evaluation   Current issues with  Current Stress Concerns        Comments  Kyarah states her husband is the stress in their home.  She feel exercise has helped her deal with stress.        Interventions  Encouraged to attend Cardiac Rehabilitation for the exercise       Continue Psychosocial Services   Follow up required by staff          Psychosocial Discharge (Final Psychosocial Re-Evaluation): Psychosocial Re-Evaluation - 08/12/17 1652      Psychosocial Re-Evaluation   Current issues with  Current Stress Concerns    Comments  Jovon states her husband is the stress in their home.  She feel exercise has helped her deal with stress.     Interventions  Encouraged to attend Cardiac Rehabilitation for the exercise    Continue Psychosocial Services   Follow up required by staff       Vocational Rehabilitation: Provide vocational rehab assistance to qualifying candidates.   Vocational Rehab Evaluation & Intervention: Vocational Rehab - 07/08/17 1500      Initial Vocational Rehab Evaluation & Intervention   Assessment shows need for Vocational Rehabilitation  No       Education: Education Goals: Education classes will be provided on a variety of topics geared toward better understanding of heart  health and risk factor modification. Participant will state understanding/return demonstration of topics presented as noted by education test scores.  Learning Barriers/Preferences: Learning Barriers/Preferences - 07/08/17 1500      Learning Barriers/Preferences   Learning Barriers  None    Learning Preferences  None       Education Topics:  AED/CPR: - Group verbal and written instruction with the use of models to demonstrate the basic use of the AED with the basic ABC's of resuscitation.   Cardiac Rehab from 08/16/2017 in [facility] Cardiac and Pulmonary Rehab  Date  08/02/17  Educator  MA  Instruction Review Code  1- Verbalizes Understanding      General Nutrition Guidelines/Fats and Fiber: -Group instruction provided by verbal, written  material, models and posters to present the general guidelines for heart healthy nutrition. Gives an explanation and review of dietary fats and fiber.   Cardiac Rehab from 08/16/2017 in [facility] Cardiac and Pulmonary Rehab  Date  07/19/17  Educator  PI  Instruction Review Code  1- Verbalizes Understanding      Controlling Sodium/Reading Food Labels: -Group verbal and written material supporting the discussion of sodium use in heart healthy nutrition. Review and explanation with models, verbal and written materials for utilization of the food label.   Cardiac Rehab from 08/16/2017 in [facility] Cardiac and Pulmonary Rehab  Date  07/26/17  Educator  PI  Instruction Review Code  1- Verbalizes Understanding      Exercise Physiology & General Exercise Guidelines: - Group verbal and written instruction with models to review the exercise physiology of the cardiovascular system and associated critical values. Provides general exercise guidelines with specific guidelines to those with heart or lung disease.    Cardiac Rehab from 08/16/2017 in [facility] Cardiac and Pulmonary Rehab  Date  08/09/17  Educator  [facility]  Instruction Review Code  1- Verbalizes Understanding      Aerobic Exercise & Resistance Training: - Gives group verbal and written instruction on the various components of exercise. Focuses on aerobic and resistive training programs and the benefits of this training and how to safely progress through these programs..   Cardiac Rehab from 08/16/2017 in [facility] Cardiac and Pulmonary Rehab  Date  08/16/17  Educator  AS  Instruction Review Code  1- Verbalizes Understanding      Flexibility, Balance, Mind/Body Relaxation: Provides group verbal/written instruction on the benefits of flexibility and balance training, including mind/body exercise modes such as yoga, pilates and tai chi.  Demonstration and skill practice provided.   Stress and Anxiety: - Provides group verbal and written instruction about the  health risks of elevated stress and causes of high stress.  Discuss the correlation between heart/lung disease and anxiety and treatment options. Review healthy ways to manage with stress and anxiety.   Depression: - Provides group verbal and written instruction on the correlation between heart/lung disease and depressed mood, treatment options, and the stigmas associated with seeking treatment.   Cardiac Rehab from 08/16/2017 in [facility] Cardiac and Pulmonary Rehab  Date  08/11/17  Educator  [facility]  Instruction Review Code  1- Verbalizes Understanding      Anatomy & Physiology of the Heart: - Group verbal and written instruction and models provide basic cardiac anatomy and physiology, with the coronary electrical and arterial systems. Review of Valvular disease and Heart Failure   Cardiac Procedures: - Group verbal and written instruction to review commonly prescribed medications for heart disease. Reviews the medication, class of the drug, and side effects. Includes  the steps to properly store meds and maintain the prescription regimen. (beta blockers and nitrates)   Cardiac Rehab from 08/16/2017 in [facility] Cardiac and Pulmonary Rehab  Date  07/21/17  Educator  [facility]  Instruction Review Code  5- Refused Teaching [had to leave early]      Cardiac Medications I: - Group verbal and written instruction to review commonly prescribed medications for heart disease. Reviews the medication, class of the drug, and side effects. Includes the steps to properly store meds and maintain the prescription regimen.   Cardiac Medications II: -Group verbal and written instruction to review commonly prescribed medications for heart disease. Reviews the medication, class of the drug, and side effects. (all other drug classes)    Go Sex-Intimacy & Heart Disease, Get SMART - Goal Setting: - Group verbal and written instruction through game format to discuss heart disease and the return to sexual intimacy. Provides  group verbal and written material to discuss and apply goal setting through the application of the S.M.A.R.T. Method.   Cardiac Rehab from 08/16/2017 in [facility] Cardiac and Pulmonary Rehab  Date  07/21/17  Educator  [facility]  Instruction Review Code  5- Refused Teaching [had to leave early]      Other Matters of the Heart: - Provides group verbal, written materials and models to describe Stable Angina and Peripheral Artery. Includes description of the disease process and treatment options available to the cardiac patient.   Exercise & Equipment Safety: - Individual verbal instruction and demonstration of equipment use and safety with use of the equipment.   Infection Prevention: - Provides verbal and written material to individual with discussion of infection control including proper hand washing and proper equipment cleaning during exercise session.   Falls Prevention: - Provides verbal and written material to individual with discussion of falls prevention and safety.   Diabetes: - Individual verbal and written instruction to review signs/symptoms of diabetes, desired ranges of glucose level fasting, after meals and with exercise. Acknowledge that pre and post exercise glucose checks will be done for 3 sessions at entry of program.   Know Your Numbers and Risk Factors: -Group verbal and written instruction about important numbers in your health.  Discussion of what are risk factors and how they play a role in the disease process.  Review of Cholesterol, Blood Pressure, Diabetes, and BMI and the role they play in your overall health.   Sleep Hygiene: -Provides group verbal and written instruction about how sleep can affect your health.  Define sleep hygiene, discuss sleep cycles and impact of sleep habits. Review good sleep hygiene tips.    Cardiac Rehab from 08/16/2017 in [facility] Cardiac and Pulmonary Rehab  Date  07/28/17  Educator  [facility]  Instruction Review Code  1- Verbalizes Understanding       Other: -Provides group and verbal instruction on various topics (see comments)   Knowledge Questionnaire Score: Knowledge Questionnaire Score - 07/08/17 1215      Knowledge Questionnaire Score   Pre Score  21/28 correct answers reviewed with Pamala Hurry       Core Components/Risk Factors/Patient Goals at Admission: Personal Goals and Risk Factors at Admission - 07/08/17 1454      Core Components/Risk Factors/Patient Goals on Admission    Weight Management  Yes;Obesity    Intervention  Weight Management: Develop a combined nutrition and exercise program designed to reach desired caloric intake, while maintaining appropriate intake of nutrient and fiber, sodium and fats, and appropriate energy expenditure required for the  weight goal.;Weight Management: Provide education and appropriate resources to help participant work on and attain dietary goals.;Weight Management/Obesity: Establish reasonable short term and long term weight goals.;Obesity: Provide education and appropriate resources to help participant work on and attain dietary goals.    Admit Weight  185 lb (83.9 kg)    Goal Weight: Short Term  180 lb (81.6 kg)    Goal Weight: Long Term  160 lb (72.6 kg)    Expected Outcomes  Short Term: Continue to assess and modify interventions until short term weight is achieved;Long Term: Adherence to nutrition and physical activity/exercise program aimed toward attainment of established weight goal;Weight Loss: Understanding of general recommendations for a balanced deficit meal plan, which promotes 1-2 lb weight loss per week and includes a negative energy balance of [phone] kcal/d;Understanding recommendations for meals to include 15-35% energy as protein, 25-35% energy from fat, 35-60% energy from carbohydrates, less than '200mg'$  of dietary cholesterol, 20-35 gm of total fiber daily;Understanding of distribution of calorie intake throughout the day with the consumption of 4-5 meals/snacks     Hypertension  Yes    Intervention  Provide education on lifestyle modifcations including regular physical activity/exercise, weight management, moderate sodium restriction and increased consumption of fresh fruit, vegetables, and low fat dairy, alcohol moderation, and smoking cessation.;Monitor prescription use compliance.    Expected Outcomes  Short Term: Continued assessment and intervention until BP is < 140/59m HG in hypertensive participants. < 130/830mHG in hypertensive participants with diabetes, heart failure or chronic kidney disease.;Long Term: Maintenance of blood pressure at goal levels.    Lipids  Yes    Intervention  Provide education and support for participant on nutrition & aerobic/resistive exercise along with prescribed medications to achieve LDL '70mg'$ , HDL >'40mg'$ .    Expected Outcomes  Short Term: Participant states understanding of desired cholesterol values and is compliant with medications prescribed. Participant is following exercise prescription and nutrition guidelines.;Long Term: Cholesterol controlled with medications as prescribed, with individualized exercise RX and with personalized nutrition plan. Value goals: LDL < '70mg'$ , HDL > 40 mg.    Stress  Yes BaKarinetates she is experiencing financial stress. She and her 9559ear old husband have had a lot of health issues over the last couple years. She is thinking of going back to work     InNurse, mental healthndividual and/or small group education and counseling on adjustment to heart disease, stress management and health-related lifestyle change. Teach and support self-help strategies.;Refer participants experiencing significant psychosocial distress to appropriate mental health specialists for further evaluation and treatment. When possible, include family members and significant others in education/counseling sessions.    Expected Outcomes  Short Term: Participant demonstrates changes in health-related behavior, relaxation and  other stress management skills, ability to obtain effective social support, and compliance with psychotropic medications if prescribed.;Long Term: Emotional wellbeing is indicated by absence of clinically significant psychosocial distress or social isolation.       Core Components/Risk Factors/Patient Goals Review:  Goals and Risk Factor Review    Row Name 08/12/17 1648             Core Components/Risk Factors/Patient Goals Review   Personal Goals Review  Hypertension;Lipids;Weight Management/Obesity       Review  Dr said her cholesterol was high in spite of long term statin use.  She is trying a new medication.  She has appt with RD to discuss nutrition.       Expected Outcomes  Short - meet with RD  and follow dietary recommendations Long - Gain better control of cholesterol with diet exercise and meds          Core Components/Risk Factors/Patient Goals at Discharge (Final Review):  Goals and Risk Factor Review - 08/12/17 1648      Core Components/Risk Factors/Patient Goals Review   Personal Goals Review  Hypertension;Lipids;Weight Management/Obesity    Review  Dr said her cholesterol was high in spite of long term statin use.  She is trying a new medication.  She has appt with RD to discuss nutrition.    Expected Outcomes  Short - meet with RD and follow dietary recommendations Long - Gain better control of cholesterol with diet exercise and meds       ITP Comments: ITP Comments    Row Name 07/08/17 1452 07/21/17 0632 08/18/17 0557       ITP Comments  Med Review completed. Initial ITP created. Diagnosis can be found in [facility] encounter 06/16/17  30 Day review. Continue with ITP unless directed changes per Medical Director review.   New to program  30 day review. Continue with ITP unless directed changes per Medical Director review.        Comments:

## 2017-08-18 NOTE — Progress Notes (Signed)
Daily Session Note  Patient Details  Name: Margaret [name] MRN: [id number] Date of Birth: 12-29-1940 Referring Provider:     Cardiac Rehab from 07/08/2017 in [facility] Cardiac and Pulmonary Rehab  Referring Provider  Arida      Encounter Date: 08/18/2017  Check In: Session Check In - 08/18/17 1704      Check-In   Location  [facility]-Cardiac & Pulmonary Rehab    Staff Present  Renita Papa, RN Vickki Hearing, BA, ACSM CEP, Exercise Physiologist;Carroll Enterkin, RN, BSN    Supervising physician immediately available to respond to emergencies  See telemetry face sheet for immediately available ER MD    Medication changes reported      No    Fall or balance concerns reported     No    Warm-up and Cool-down  Performed on first and last piece of equipment    Resistance Training Performed  Yes    VAD Patient?  No      Pain Assessment   Currently in Pain?  No/denies          Social History   Tobacco Use  Smoking Status Former Smoker  . Types: Cigarettes  . Last attempt to quit: 06/22/1984  . Years since quitting: 33.1  Smokeless Tobacco Never Used    Goals Met:  Independence with exercise equipment Exercise tolerated well No report of cardiac concerns or symptoms Strength training completed today  Goals Unmet:  Not Applicable  Comments: Pt able to follow exercise prescription today without complaint.  Will continue to monitor for progression.    Dr. Emily Filbert is Medical Director for [place] and [facility].

## 2017-08-18 NOTE — Telephone Encounter (Signed)
LM that med was refilled.

## 2017-08-18 NOTE — Telephone Encounter (Signed)
Patient left a msg on the refill vm requesting refills of brilinta. She stated that this was ordered by a hospitalist at [facility] and the pharmacy has been faxing the request there. She would like a call at [phone] when this has been handled. Thanks, MI

## 2017-08-19 DIAGNOSIS — I214 Non-ST elevation (NSTEMI) myocardial infarction: Secondary | ICD-10-CM

## 2017-08-19 DIAGNOSIS — I252 Old myocardial infarction: Secondary | ICD-10-CM | POA: Diagnosis not present

## 2017-08-19 NOTE — Progress Notes (Signed)
Daily Session Note  Patient Details  Name: Margaret [name] MRN: [id number] Date of Birth: 1941-03-01 Referring Provider:     Cardiac Rehab from 07/08/2017 in [facility] Cardiac and Pulmonary Rehab  Referring Provider  Arida      Encounter Date: 08/19/2017  Check In: Session Check In - 08/19/17 1703      Check-In   Location  [facility]-Cardiac & Pulmonary Rehab    Staff Present  Earlean Shawl, BS, ACSM CEP, Exercise Physiologist;Meredith Sherryll Burger, RN BSN;Keyna Blizard Flavia Shipper    Supervising physician immediately available to respond to emergencies  See telemetry face sheet for immediately available ER MD    Medication changes reported      No    Fall or balance concerns reported     No    Warm-up and Cool-down  Performed on first and last piece of equipment    Resistance Training Performed  Yes    VAD Patient?  No      Pain Assessment   Currently in Pain?  No/denies          Social History   Tobacco Use  Smoking Status Former Smoker  . Types: Cigarettes  . Last attempt to quit: 06/22/1984  . Years since quitting: 33.1  Smokeless Tobacco Never Used    Goals Met:  Independence with exercise equipment Exercise tolerated well No report of cardiac concerns or symptoms Strength training completed today  Goals Unmet:  Not Applicable  Comments: Pt able to follow exercise prescription today without complaint.  Will continue to monitor for progression.   Dr. Emily Filbert is Medical Director for [place] and [facility].

## 2017-08-23 ENCOUNTER — Encounter: Payer: PPO | Attending: Cardiovascular Disease

## 2017-08-23 DIAGNOSIS — K219 Gastro-esophageal reflux disease without esophagitis: Secondary | ICD-10-CM | POA: Insufficient documentation

## 2017-08-23 DIAGNOSIS — Z79899 Other long term (current) drug therapy: Secondary | ICD-10-CM | POA: Insufficient documentation

## 2017-08-23 DIAGNOSIS — F419 Anxiety disorder, unspecified: Secondary | ICD-10-CM | POA: Insufficient documentation

## 2017-08-23 DIAGNOSIS — Z87891 Personal history of nicotine dependence: Secondary | ICD-10-CM | POA: Insufficient documentation

## 2017-08-23 DIAGNOSIS — I252 Old myocardial infarction: Secondary | ICD-10-CM | POA: Insufficient documentation

## 2017-08-23 DIAGNOSIS — I10 Essential (primary) hypertension: Secondary | ICD-10-CM | POA: Insufficient documentation

## 2017-08-23 DIAGNOSIS — Z955 Presence of coronary angioplasty implant and graft: Secondary | ICD-10-CM | POA: Insufficient documentation

## 2017-08-23 DIAGNOSIS — Z86010 Personal history of colonic polyps: Secondary | ICD-10-CM | POA: Insufficient documentation

## 2017-08-23 DIAGNOSIS — Z7902 Long term (current) use of antithrombotics/antiplatelets: Secondary | ICD-10-CM | POA: Insufficient documentation

## 2017-08-23 DIAGNOSIS — Z7982 Long term (current) use of aspirin: Secondary | ICD-10-CM | POA: Insufficient documentation

## 2017-08-23 DIAGNOSIS — Z8673 Personal history of transient ischemic attack (TIA), and cerebral infarction without residual deficits: Secondary | ICD-10-CM | POA: Insufficient documentation

## 2017-08-23 DIAGNOSIS — F329 Major depressive disorder, single episode, unspecified: Secondary | ICD-10-CM | POA: Insufficient documentation

## 2017-08-23 DIAGNOSIS — I2510 Atherosclerotic heart disease of native coronary artery without angina pectoris: Secondary | ICD-10-CM | POA: Insufficient documentation

## 2017-08-23 DIAGNOSIS — E785 Hyperlipidemia, unspecified: Secondary | ICD-10-CM | POA: Insufficient documentation

## 2017-08-25 ENCOUNTER — Encounter: Payer: PPO | Admitting: *Deleted

## 2017-08-25 DIAGNOSIS — E785 Hyperlipidemia, unspecified: Secondary | ICD-10-CM | POA: Diagnosis not present

## 2017-08-25 DIAGNOSIS — K219 Gastro-esophageal reflux disease without esophagitis: Secondary | ICD-10-CM | POA: Diagnosis not present

## 2017-08-25 DIAGNOSIS — Z8673 Personal history of transient ischemic attack (TIA), and cerebral infarction without residual deficits: Secondary | ICD-10-CM | POA: Diagnosis not present

## 2017-08-25 DIAGNOSIS — F329 Major depressive disorder, single episode, unspecified: Secondary | ICD-10-CM | POA: Diagnosis not present

## 2017-08-25 DIAGNOSIS — Z7982 Long term (current) use of aspirin: Secondary | ICD-10-CM | POA: Diagnosis not present

## 2017-08-25 DIAGNOSIS — I2510 Atherosclerotic heart disease of native coronary artery without angina pectoris: Secondary | ICD-10-CM | POA: Diagnosis not present

## 2017-08-25 DIAGNOSIS — I10 Essential (primary) hypertension: Secondary | ICD-10-CM | POA: Diagnosis not present

## 2017-08-25 DIAGNOSIS — Z87891 Personal history of nicotine dependence: Secondary | ICD-10-CM | POA: Diagnosis not present

## 2017-08-25 DIAGNOSIS — Z86010 Personal history of colonic polyps: Secondary | ICD-10-CM | POA: Diagnosis not present

## 2017-08-25 DIAGNOSIS — Z7902 Long term (current) use of antithrombotics/antiplatelets: Secondary | ICD-10-CM | POA: Diagnosis not present

## 2017-08-25 DIAGNOSIS — I252 Old myocardial infarction: Secondary | ICD-10-CM | POA: Diagnosis not present

## 2017-08-25 DIAGNOSIS — Z955 Presence of coronary angioplasty implant and graft: Secondary | ICD-10-CM

## 2017-08-25 DIAGNOSIS — I214 Non-ST elevation (NSTEMI) myocardial infarction: Secondary | ICD-10-CM

## 2017-08-25 DIAGNOSIS — F419 Anxiety disorder, unspecified: Secondary | ICD-10-CM | POA: Diagnosis not present

## 2017-08-25 DIAGNOSIS — Z79899 Other long term (current) drug therapy: Secondary | ICD-10-CM | POA: Diagnosis not present

## 2017-08-25 NOTE — Progress Notes (Signed)
Daily Session Note  Patient Details  Name: Margaret [name] MRN: [id number] Date of Birth: [birth year] Referring Provider:     Cardiac Rehab from 07/08/2017 in [facility] Cardiac and Pulmonary Rehab  Referring Provider  Arida      Encounter Date: 08/25/2017  Check In: Session Check In - 08/25/17 1638      Check-In   Location  [facility]-Cardiac & Pulmonary Rehab    Staff Present  Renita Papa, RN Vickki Hearing, BA, ACSM CEP, Exercise Physiologist;Carroll Enterkin, RN, BSN    Supervising physician immediately available to respond to emergencies  See telemetry face sheet for immediately available ER MD    Medication changes reported      No    Fall or balance concerns reported     No    Warm-up and Cool-down  Performed on first and last piece of equipment    Resistance Training Performed  Yes    VAD Patient?  No      Pain Assessment   Currently in Pain?  No/denies          Social History   Tobacco Use  Smoking Status Former Smoker  . Types: Cigarettes  . Last attempt to quit: 06/22/1984  . Years since quitting: 33.1  Smokeless Tobacco Never Used    Goals Met:  Independence with exercise equipment Exercise tolerated well No report of cardiac concerns or symptoms Strength training completed today  Goals Unmet:  Not Applicable  Comments: Pt able to follow exercise prescription today without complaint.  Will continue to monitor for progression.    Dr. Emily Filbert is Medical Director for [place] and [facility].

## 2017-08-26 ENCOUNTER — Encounter: Payer: PPO | Admitting: *Deleted

## 2017-08-26 DIAGNOSIS — Z955 Presence of coronary angioplasty implant and graft: Secondary | ICD-10-CM

## 2017-08-26 DIAGNOSIS — I252 Old myocardial infarction: Secondary | ICD-10-CM | POA: Diagnosis not present

## 2017-08-26 DIAGNOSIS — I214 Non-ST elevation (NSTEMI) myocardial infarction: Secondary | ICD-10-CM

## 2017-08-26 NOTE — Progress Notes (Signed)
Daily Session Note  Patient Details  Name: Margaret [name] MRN: [id number] Date of Birth: 11-10-1940 Referring Provider:     Cardiac Rehab from 07/08/2017 in [facility] Cardiac and Pulmonary Rehab  Referring Provider  Arida      Encounter Date: 08/26/2017  Check In: Session Check In - 08/26/17 1706      Check-In   Staff Present  Renita Papa, RN Moises Blood, BS, ACSM CEP, Exercise Physiologist;Mary Kellie Shropshire, RN, BSN, MA    Supervising physician immediately available to respond to emergencies  See telemetry face sheet for immediately available ER MD    Medication changes reported      No    Fall or balance concerns reported     No    Warm-up and Cool-down  Performed on first and last piece of equipment    Resistance Training Performed  Yes    VAD Patient?  No      Pain Assessment   Currently in Pain?  No/denies        Exercise Prescription Changes - 08/26/17 1400      Response to Exercise   Blood Pressure (Admit)  132/62    Blood Pressure (Exit)  144/68    Heart Rate (Admit)  80 bpm    Heart Rate (Exercise)  113 bpm    Heart Rate (Exit)  82 bpm    Rating of Perceived Exertion (Exercise)  12    Symptoms  none    Duration  Continue with 45 min of aerobic exercise without signs/symptoms of physical distress.    Intensity  THRR unchanged      Progression   Progression  Continue to progress workloads to maintain intensity without signs/symptoms of physical distress.      Resistance Training   Training Prescription  Yes    Weight  3 lb    Reps  10-15      Interval Training   Interval Training  No      NuStep   Level  2    SPM  80    Minutes  15       Social History   Tobacco Use  Smoking Status Former Smoker  . Types: Cigarettes  . Last attempt to quit: 06/22/1984  . Years since quitting: 33.2  Smokeless Tobacco Never Used    Goals Met:  Independence with exercise equipment Exercise tolerated well No report of cardiac concerns or symptoms Strength  training completed today  Goals Unmet:  Not Applicable  Comments: Pt able to follow exercise prescription today without complaint.  Will continue to monitor for progression.    Dr. Emily Filbert is Medical Director for [place] and [facility].

## 2017-08-30 ENCOUNTER — Encounter: Payer: PPO | Admitting: *Deleted

## 2017-08-30 DIAGNOSIS — I252 Old myocardial infarction: Secondary | ICD-10-CM | POA: Diagnosis not present

## 2017-08-30 DIAGNOSIS — Z955 Presence of coronary angioplasty implant and graft: Secondary | ICD-10-CM

## 2017-08-30 DIAGNOSIS — I214 Non-ST elevation (NSTEMI) myocardial infarction: Secondary | ICD-10-CM

## 2017-08-30 NOTE — Progress Notes (Signed)
Daily Session Note  Patient Details  Name: Margaret [name] MRN: [id number] Date of Birth: 1940/08/07 Referring Provider:     Cardiac Rehab from 07/08/2017 in [facility] Cardiac and Pulmonary Rehab  Referring Provider  Arida      Encounter Date: 08/30/2017  Check In: Session Check In - 08/30/17 1723      Check-In   Location  [facility]-Cardiac & Pulmonary Rehab    Staff Present  Earlean Shawl, BS, ACSM CEP, Exercise Physiologist;Amanda Oletta Darter, BA, ACSM CEP, Exercise Physiologist;Meredith Sherryll Burger, RN BSN;Carroll Enterkin, RN, BSN    Supervising physician immediately available to respond to emergencies  See telemetry face sheet for immediately available ER MD    Medication changes reported      No    Fall or balance concerns reported     No    Warm-up and Cool-down  Performed on first and last piece of equipment    Resistance Training Performed  Yes    VAD Patient?  No      Pain Assessment   Currently in Pain?  No/denies    Multiple Pain Sites  No          Social History   Tobacco Use  Smoking Status Former Smoker  . Types: Cigarettes  . Last attempt to quit: 06/22/1984  . Years since quitting: 33.2  Smokeless Tobacco Never Used    Goals Met:  Independence with exercise equipment Exercise tolerated well No report of cardiac concerns or symptoms Strength training completed today  Goals Unmet:  Not Applicable  Comments: Pt able to follow exercise prescription today without complaint.  Will continue to monitor for progression.    Dr. Emily Filbert is Medical Director for [place] and [facility].

## 2017-09-01 ENCOUNTER — Encounter: Payer: Self-pay | Admitting: *Deleted

## 2017-09-01 DIAGNOSIS — I252 Old myocardial infarction: Secondary | ICD-10-CM | POA: Diagnosis not present

## 2017-09-01 DIAGNOSIS — I214 Non-ST elevation (NSTEMI) myocardial infarction: Secondary | ICD-10-CM

## 2017-09-01 NOTE — Progress Notes (Signed)
Daily Session Note  Patient Details  Name: Margaret [name] MRN: [id number] Date of Birth: 05-17-1941 Referring Provider:     Cardiac Rehab from 07/08/2017 in [facility] Cardiac and Pulmonary Rehab  Referring Provider  Arida      Encounter Date: 09/01/2017  Check In: Session Check In - 09/01/17 1650      Check-In   Location  [facility]-Cardiac & Pulmonary Rehab    Staff Present  Gerlene Burdock, RN, BSN;Meredith Sherryll Burger, RN Vickki Hearing, BA, ACSM CEP, Exercise Physiologist    Supervising physician immediately available to respond to emergencies  See telemetry face sheet for immediately available ER MD    Medication changes reported      No    Fall or balance concerns reported     No    Tobacco Cessation  No Change    Warm-up and Cool-down  Performed on first and last piece of equipment    Resistance Training Performed  Yes    VAD Patient?  No      Pain Assessment   Currently in Pain?  No/denies          Social History   Tobacco Use  Smoking Status Former Smoker  . Types: Cigarettes  . Last attempt to quit: 06/22/1984  . Years since quitting: 33.2  Smokeless Tobacco Never Used    Goals Met:  Proper associated with RPD/PD & O2 Sat No report of cardiac concerns or symptoms Strength training completed today  Goals Unmet:  Not Applicable  Comments:     Dr. Emily Filbert is Medical Director for [place] and [facility].

## 2017-09-02 DIAGNOSIS — I252 Old myocardial infarction: Secondary | ICD-10-CM | POA: Diagnosis not present

## 2017-09-02 DIAGNOSIS — I214 Non-ST elevation (NSTEMI) myocardial infarction: Secondary | ICD-10-CM

## 2017-09-02 NOTE — Progress Notes (Signed)
Daily Session Note  Patient Details  Name: Margaret [name] MRN: [id number] Date of Birth: [birth year] Referring Provider:     Cardiac Rehab from 07/08/2017 in [facility] Cardiac and Pulmonary Rehab  Referring Provider  Arida      Encounter Date: 09/02/2017  Check In: Session Check In - 09/02/17 1616      Check-In   Location  [facility]-Cardiac & Pulmonary Rehab    Staff Present  Earlean Shawl, BS, ACSM CEP, Exercise Physiologist;Meredith Sherryll Burger, RN BSN;Gina Costilla Flavia Shipper    Supervising physician immediately available to respond to emergencies  See telemetry face sheet for immediately available ER MD    Medication changes reported      No    Fall or balance concerns reported     No    Tobacco Cessation  No Change    Warm-up and Cool-down  Performed on first and last piece of equipment    Resistance Training Performed  Yes    VAD Patient?  No      Pain Assessment   Currently in Pain?  No/denies          Social History   Tobacco Use  Smoking Status Former Smoker  . Types: Cigarettes  . Last attempt to quit: 06/22/1984  . Years since quitting: 33.2  Smokeless Tobacco Never Used    Goals Met:  Independence with exercise equipment Exercise tolerated well No report of cardiac concerns or symptoms Strength training completed today  Goals Unmet:  Not Applicable  Comments: Pt able to follow exercise prescription today without complaint.  Will continue to monitor for progression.   Dr. Emily Filbert is Medical Director for [place] and [facility].

## 2017-09-06 DIAGNOSIS — Z955 Presence of coronary angioplasty implant and graft: Secondary | ICD-10-CM

## 2017-09-06 DIAGNOSIS — I214 Non-ST elevation (NSTEMI) myocardial infarction: Secondary | ICD-10-CM

## 2017-09-06 DIAGNOSIS — I252 Old myocardial infarction: Secondary | ICD-10-CM | POA: Diagnosis not present

## 2017-09-06 NOTE — Progress Notes (Signed)
Daily Session Note  Patient Details  Name: Margaret [name] MRN: [id number] Date of Birth: 08/17/1940 Referring Provider:     Cardiac Rehab from 07/08/2017 in [facility] Cardiac and Pulmonary Rehab  Referring Provider  Arida      Encounter Date: 09/06/2017  Check In: Session Check In - 09/06/17 1713      Check-In   Location  [facility]-Cardiac & Pulmonary Rehab    Staff Present  Renita Papa, RN Moises Blood, BS, ACSM CEP, Exercise Physiologist;[place] Oletta Darter, BA, ACSM CEP, Exercise Physiologist;Carroll Enterkin, RN, BSN    Supervising physician immediately available to respond to emergencies  See telemetry face sheet for immediately available ER MD    Medication changes reported      No    Fall or balance concerns reported     No    Warm-up and Cool-down  Performed on first and last piece of equipment    Resistance Training Performed  Yes    VAD Patient?  No      Pain Assessment   Currently in Pain?  No/denies    Multiple Pain Sites  No          Social History   Tobacco Use  Smoking Status Former Smoker  . Types: Cigarettes  . Last attempt to quit: 06/22/1984  . Years since quitting: 33.2  Smokeless Tobacco Never Used    Goals Met:  Independence with exercise equipment Exercise tolerated well No report of cardiac concerns or symptoms Strength training completed today  Goals Unmet:  Not Applicable  Comments: Pt able to follow exercise prescription today without complaint.  Will continue to monitor for progression.    Dr. Emily Filbert is Medical Director for [place] and [facility].

## 2017-09-08 ENCOUNTER — Encounter: Payer: PPO | Admitting: *Deleted

## 2017-09-08 ENCOUNTER — Encounter: Payer: Self-pay | Admitting: *Deleted

## 2017-09-08 DIAGNOSIS — I252 Old myocardial infarction: Secondary | ICD-10-CM | POA: Diagnosis not present

## 2017-09-08 DIAGNOSIS — I214 Non-ST elevation (NSTEMI) myocardial infarction: Secondary | ICD-10-CM

## 2017-09-08 DIAGNOSIS — Z955 Presence of coronary angioplasty implant and graft: Secondary | ICD-10-CM

## 2017-09-08 NOTE — Progress Notes (Signed)
Daily Session Note  Patient Details  Name: Margaret [name] MRN: [id number] Date of Birth: [birth year] Referring Provider:     Cardiac Rehab from 07/08/2017 in [facility] Cardiac and Pulmonary Rehab  Referring Provider  Arida      Encounter Date: 09/08/2017  Check In: Session Check In - 09/08/17 1636      Check-In   Location  [facility]-Cardiac & Pulmonary Rehab    Staff Present  Renita Papa, RN BSN;Aliesha Dolata, RN, BSN    Supervising physician immediately available to respond to emergencies  See telemetry face sheet for immediately available ER MD    Medication changes reported      No    Fall or balance concerns reported     No    Tobacco Cessation  No Change    Warm-up and Cool-down  Performed on first and last piece of equipment    Resistance Training Performed  Yes    VAD Patient?  No      Pain Assessment   Currently in Pain?  No/denies        Exercise Prescription Changes - 09/08/17 1100      Response to Exercise   Blood Pressure (Admit)  126/68    Blood Pressure (Exercise)  162/84    Blood Pressure (Exit)  142/68    Heart Rate (Admit)  78 bpm    Heart Rate (Exercise)  100 bpm    Heart Rate (Exit)  61 bpm    Rating of Perceived Exertion (Exercise)  13    Symptoms  none    Duration  Continue with 45 min of aerobic exercise without signs/symptoms of physical distress.    Intensity  THRR unchanged      Progression   Progression  Continue to progress workloads to maintain intensity without signs/symptoms of physical distress.    Average METs  2.53      Resistance Training   Training Prescription  Yes    Weight  3 lb    Reps  10-15      Interval Training   Interval Training  No      Treadmill   MPH  2    Grade  0    Minutes  15    METs  2.5      Home Exercise Plan   Plans to continue exercise at  [employer] (comment) tennis and walking at home    Frequency  Add 3 additional days to program exercise sessions.    Initial Home Exercises Provided   08/05/17       Social History   Tobacco Use  Smoking Status Former Smoker  . Types: Cigarettes  . Last attempt to quit: 06/22/1984  . Years since quitting: 33.2  Smokeless Tobacco Never Used    Goals Met:  Proper associated with RPD/PD & O2 Sat Personal goals reviewed No report of cardiac concerns or symptoms Strength training completed today  Goals Unmet:  Not Applicable  Comments:     Dr. Emily Filbert is Medical Director for [employer] and [facility].

## 2017-09-09 VITALS — Ht 66.0 in | Wt 183.8 lb

## 2017-09-09 DIAGNOSIS — I252 Old myocardial infarction: Secondary | ICD-10-CM | POA: Diagnosis not present

## 2017-09-09 DIAGNOSIS — I214 Non-ST elevation (NSTEMI) myocardial infarction: Secondary | ICD-10-CM

## 2017-09-09 NOTE — Progress Notes (Signed)
Daily Session Note  Patient Details  Name: Margaret [name] MRN: [id number] Date of Birth: [birth year] Referring Provider:     Cardiac Rehab from 07/08/2017 in [facility] Cardiac and Pulmonary Rehab  Referring Provider  Arida      Encounter Date: 09/09/2017  Check In: Session Check In - 09/09/17 1648      Check-In   Location  [facility]-Cardiac & Pulmonary Rehab    Staff Present  Margaret [name], BS, ACSM CEP, Exercise Physiologist;Margaret Sherryll Burger, RN BSN;Margaret [name]    Supervising physician immediately available to respond to emergencies  See telemetry face sheet for immediately available ER MD    Medication changes reported      No    Fall or balance concerns reported     No    Tobacco Cessation  No Change    Warm-up and Cool-down  Performed on first and last piece of equipment    Resistance Training Performed  Yes    VAD Patient?  No      Pain Assessment   Currently in Pain?  No/denies          Social History   Tobacco Use  Smoking Status Former Smoker  . Types: Cigarettes  . Last attempt to quit: 06/22/1984  . Years since quitting: 33.2  Smokeless Tobacco Never Used    Goals Met:  Independence with exercise equipment Exercise tolerated well No report of cardiac concerns or symptoms Strength training completed today  Goals Unmet:  Not Applicable  Comments:  [place] Name 07/08/17 1522 09/09/17 1711       6 Minute Walk   Phase  -  Discharge    Distance  1150 feet  1186 feet    Distance % Change  -  3.1 %    Distance Feet Change  -  36 ft    Walk Time  6 minutes  6 minutes    # of Rest Breaks  0  0    MPH  2.18  2.25    METS  2.28  2.21    RPE  15  13    Perceived Dyspnea   0  -    VO2 Peak  7.99  7.75    Symptoms  No  -    Resting HR  55 bpm  74 bpm    Resting BP  128/62  138/68    Resting Oxygen Saturation   100 %  99 %    Exercise Oxygen Saturation  during 6 min walk  100 %  99 %    Max Ex. HR  102 bpm  80 bpm    Max Ex. BP  160/64   180/62    2 Minute Post BP  128/58  -      Pt able to follow exercise prescription today without complaint.  Will continue to monitor for progression.   Dr. Emily [name] is Medical Director for [name] and [facility].

## 2017-09-12 ENCOUNTER — Other Ambulatory Visit: Payer: Self-pay

## 2017-09-12 ENCOUNTER — Emergency Department: Payer: PPO

## 2017-09-12 ENCOUNTER — Emergency Department
Admission: EM | Admit: 2017-09-12 | Discharge: 2017-09-13 | Disposition: A | Discharge status: Home / Self Care | Payer: PPO | Attending: Emergency Medicine | Admitting: Emergency Medicine

## 2017-09-12 DIAGNOSIS — Z9884 Bariatric surgery status: Secondary | ICD-10-CM | POA: Insufficient documentation

## 2017-09-12 DIAGNOSIS — I2510 Atherosclerotic heart disease of native coronary artery without angina pectoris: Secondary | ICD-10-CM | POA: Diagnosis not present

## 2017-09-12 DIAGNOSIS — Z96642 Presence of left artificial hip joint: Secondary | ICD-10-CM | POA: Insufficient documentation

## 2017-09-12 DIAGNOSIS — Z7982 Long term (current) use of aspirin: Secondary | ICD-10-CM | POA: Diagnosis not present

## 2017-09-12 DIAGNOSIS — I252 Old myocardial infarction: Secondary | ICD-10-CM | POA: Insufficient documentation

## 2017-09-12 DIAGNOSIS — R079 Chest pain, unspecified: Secondary | ICD-10-CM | POA: Diagnosis not present

## 2017-09-12 DIAGNOSIS — I5030 Unspecified diastolic (congestive) heart failure: Secondary | ICD-10-CM | POA: Diagnosis not present

## 2017-09-12 DIAGNOSIS — I110 Hypertensive heart disease with heart failure: Secondary | ICD-10-CM | POA: Insufficient documentation

## 2017-09-12 DIAGNOSIS — R112 Nausea with vomiting, unspecified: Secondary | ICD-10-CM | POA: Diagnosis not present

## 2017-09-12 DIAGNOSIS — Z87891 Personal history of nicotine dependence: Secondary | ICD-10-CM | POA: Insufficient documentation

## 2017-09-12 DIAGNOSIS — Z79899 Other long term (current) drug therapy: Secondary | ICD-10-CM | POA: Diagnosis not present

## 2017-09-12 DIAGNOSIS — Z8673 Personal history of transient ischemic attack (TIA), and cerebral infarction without residual deficits: Secondary | ICD-10-CM | POA: Diagnosis not present

## 2017-09-12 DIAGNOSIS — R0789 Other chest pain: Secondary | ICD-10-CM | POA: Diagnosis not present

## 2017-09-12 LAB — CBC
HCT: 32.6 % — ABNORMAL LOW (ref 35.0–47.0)
Hemoglobin: 10.2 g/dL — ABNORMAL LOW (ref 12.0–16.0)
MCH: 23.7 pg — ABNORMAL LOW (ref 26.0–34.0)
MCHC: 31.4 g/dL — ABNORMAL LOW (ref 32.0–36.0)
MCV: 75.6 fL — AB (ref 80.0–100.0)
Platelets: 313 10*3/uL (ref 150–440)
RBC: 4.31 MIL/uL (ref 3.80–5.20)
RDW: 18 % — AB (ref 11.5–14.5)
WBC: 9.6 10*3/uL (ref 3.6–11.0)

## 2017-09-12 LAB — BASIC METABOLIC PANEL
Anion gap: 10 (ref 5–15)
BUN: 16 mg/dL (ref 6–20)
CHLORIDE: 111 mmol/L (ref 101–111)
CO2: 21 mmol/L — ABNORMAL LOW (ref 22–32)
Calcium: 8.8 mg/dL — ABNORMAL LOW (ref 8.9–10.3)
Creatinine, Ser: 0.94 mg/dL (ref 0.44–1.00)
GFR calc Af Amer: >60 mL/min (ref >60)
GFR, EST NON AFRICAN AMERICAN: 57 mL/min — AB (ref >60)
Glucose, Bld: 200 mg/dL — ABNORMAL HIGH (ref 65–99)
Potassium: 3.3 mmol/L — ABNORMAL LOW (ref 3.5–5.1)
SODIUM: 142 mmol/L (ref 135–145)

## 2017-09-12 LAB — TROPONIN I: Troponin I: <0.03 ng/mL (ref <0.03)

## 2017-09-12 NOTE — ED Triage Notes (Addendum)
Reports chest pain for approximately 2 hours   Reports mid sternal non radiating but right hand was tingling.  Took NTG x 2 and unsure if helped.

## 2017-09-12 NOTE — ED Notes (Signed)
Pt has hx of mi - today ate week old sesame chicken, had episode of vomiting and chest pain. Took a nitro x 2. No long has chest pain and is no longer nauseous

## 2017-09-13 ENCOUNTER — Encounter: Payer: PPO | Admitting: *Deleted

## 2017-09-13 ENCOUNTER — Telehealth: Payer: Self-pay

## 2017-09-13 DIAGNOSIS — Z955 Presence of coronary angioplasty implant and graft: Secondary | ICD-10-CM

## 2017-09-13 DIAGNOSIS — M67431 Ganglion, right wrist: Secondary | ICD-10-CM | POA: Diagnosis not present

## 2017-09-13 DIAGNOSIS — I214 Non-ST elevation (NSTEMI) myocardial infarction: Secondary | ICD-10-CM

## 2017-09-13 DIAGNOSIS — M25562 Pain in left knee: Secondary | ICD-10-CM | POA: Diagnosis not present

## 2017-09-13 LAB — TROPONIN I

## 2017-09-13 NOTE — Telephone Encounter (Signed)
Pt walked in as f/u to her ED visit on 3/24 for chest pain. Pt wanted to make sure she did not need to setup an additional appointment as she has one already scheduled on 4/29 @11 :20am with Dr. Martinique. Confirmed with pt she does not need additional appointment. Also verified that she still has her Nitro and that she date the bottle.  Pt verbalized understanding, no additional questions at [facility] time.
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
## 2017-09-13 NOTE — Progress Notes (Signed)
Incomplete Session Note  Patient Details  Name: Margaret [name] MRN: [id number] Date of Birth: 1940-07-30 Referring Provider:     Cardiac Rehab from 07/08/2017 in [facility] Cardiac and Pulmonary Rehab  Referring Provider  Margaret [name] did not complete her rehab session.  Margaret [name] was in the ER last night with what was ruled indigestion. She also got cortisone shots in her knee today and her doctor told her not to do too much today. She stayed to listen to education and left without exercising. She plans to come back to the next exercise session.

## 2017-09-13 NOTE — Discharge Instructions (Addendum)
Please follow-up with your cardiologist for further evaluation of your chest pain ?

## 2017-09-15 ENCOUNTER — Encounter: Payer: Self-pay | Admitting: *Deleted

## 2017-09-15 DIAGNOSIS — Z955 Presence of coronary angioplasty implant and graft: Secondary | ICD-10-CM

## 2017-09-15 DIAGNOSIS — I214 Non-ST elevation (NSTEMI) myocardial infarction: Secondary | ICD-10-CM

## 2017-09-15 DIAGNOSIS — I252 Old myocardial infarction: Secondary | ICD-10-CM | POA: Diagnosis not present

## 2017-09-15 NOTE — Progress Notes (Signed)
Daily Session Note  Patient Details  Name: Margaret [name] MRN: [id number] Date of Birth: 01/09/1941 Referring Provider:     Cardiac Rehab from 07/08/2017 in [facility] Cardiac and Pulmonary Rehab  Referring Provider  Arida      Encounter Date: 09/15/2017  Check In: Session Check In - 09/15/17 1732      Check-In   Location  [facility]-Cardiac & Pulmonary Rehab    Staff Present  Renita Papa, RN Vickki Hearing, BA, ACSM CEP, Exercise Physiologist;Carroll Enterkin, RN, BSN    Supervising physician immediately available to respond to emergencies  See telemetry face sheet for immediately available ER MD    Medication changes reported      No    Fall or balance concerns reported     No    Warm-up and Cool-down  Performed on first and last piece of equipment    Resistance Training Performed  Yes    VAD Patient?  No      Pain Assessment   Currently in Pain?  No/denies    Multiple Pain Sites  No          Social History   Tobacco Use  Smoking Status Former Smoker  . Types: Cigarettes  . Last attempt to quit: 06/22/1984  . Years since quitting: 33.2  Smokeless Tobacco Never Used    Goals Met:  Independence with exercise equipment Exercise tolerated well No report of cardiac concerns or symptoms Strength training completed today  Goals Unmet:  Not Applicable  Comments: Pt able to follow exercise prescription today without complaint.  Will continue to monitor for progression.    Dr. Emily Filbert is Medical Director for [facility] and [facility].

## 2017-09-15 NOTE — Progress Notes (Signed)
Cardiac Individual Treatment Plan  Patient Details  Name: Margaret [name] MRN: [id number] Date of Birth: 1940-12-06 Referring Provider:     Cardiac Rehab from 07/08/2017 in [facility] Cardiac and Pulmonary Rehab  Referring Provider  Arida      Initial Encounter Date:    Cardiac Rehab from 07/08/2017 in [facility] Cardiac and Pulmonary Rehab  Date  07/08/17  Referring Provider  Fletcher Anon      Visit Diagnosis: NSTEMI (non-ST elevated myocardial infarction) [facility])  Status post coronary artery stent placement  Patient's Home Medications on Admission:  Current Outpatient Medications:  .  amLODipine (NORVASC) 5 MG tablet, Take 1 tablet (5 mg total) by mouth daily., Disp: 90 tablet, Rfl: 3 .  aspirin EC 81 MG tablet, Take 1 tablet (81 mg total) by mouth daily., Disp: 90 tablet, Rfl: 3 .  atorvastatin (LIPITOR) 80 MG tablet, Take 80 mg by mouth daily., Disp: , Rfl:  .  carvedilol (COREG) 6.25 MG tablet, Take 1 tablet (6.25 mg total) by mouth 2 (two) times daily with a meal., Disp: 60 tablet, Rfl: 1 .  Docusate Calcium (STOOL SOFTENER PO), Take by mouth. As needed, Disp: , Rfl:  .  ezetimibe (ZETIA) 10 MG tablet, Take 1 tablet (10 mg total) by mouth daily., Disp: 30 tablet, Rfl: 5 .  losartan (COZAAR) 50 MG tablet, Take 1 tablet (50 mg total) by mouth daily., Disp: 30 tablet, Rfl: 1 .  nitroGLYCERIN (NITROSTAT) 0.4 MG SL tablet, Place 1 tablet (0.4 mg total) under the tongue every 5 (five) minutes as needed for chest pain., Disp: 30 tablet, Rfl: 2 .  pantoprazole (PROTONIX) 40 MG tablet, Take 40 mg by mouth daily., Disp: , Rfl:  .  sertraline (ZOLOFT) 25 MG tablet, Take 25 mg by mouth daily., Disp: , Rfl:  .  ticagrelor (BRILINTA) 90 MG TABS tablet, Take 1 tablet (90 mg total) by mouth 2 (two) times daily., Disp: 60 tablet, Rfl: 5  Past Medical History: Past Medical History:  Diagnosis Date  . Anxiety and depression   . CAD (coronary artery disease)   . Cellulitis of right leg 2005  . Colon polyp   .  Dyspnea on exertion   . GERD (gastroesophageal reflux disease)   . Hyperlipidemia   . Hypertension   . Morbid obesity ([place])   . Presence of stent in right coronary artery 02/08/12   2.5x18 Xience distal RCA  . Right shoulder pain   . Stroke ([place])   . Vaginal prolapse     Tobacco Use: Social History   Tobacco Use  Smoking Status Former Smoker  . Types: Cigarettes  . Last attempt to quit: 06/22/1984  . Years since quitting: 33.2  Smokeless Tobacco Never Used    Labs: Recent Review [facility] Cardiac and Pulmonary Rehab Latest Ref Rng & Units 06/15/2017   Cholestrol 0 - 200 mg/dL 225(H)   LDLCALC 0 - 99 mg/dL 146(H)   HDL >40 mg/dL 60   Trlycerides <150 mg/dL 96       Exercise Target Goals:    Exercise Program Goal: Individual exercise prescription set using results from initial 6 min walk test and THRR while considering  patient's activity barriers and safety.   Exercise Prescription Goal: Initial exercise prescription builds to 30-45 minutes a day of aerobic activity, 2-3 days per week.  Home exercise guidelines will be given to patient during program as part of exercise prescription that the participant will acknowledge.  Activity Barriers &  Risk Stratification: Activity Barriers & Cardiac Risk Stratification - 07/08/17 1502      Activity Barriers & Cardiac Risk Stratification   Activity Barriers  Joint Problems left knee pain, receives injections for    Cardiac Risk Stratification  Moderate       6 Minute Walk: 6 Minute Walk    Row Name 07/08/17 1522 09/09/17 1711       6 Minute Walk   Phase  -  Discharge    Distance  1150 feet  1186 feet    Distance % Change  -  3.1 %    Distance Feet Change  -  36 ft    Walk Time  6 minutes  6 minutes    # of Rest Breaks  0  0    MPH  2.18  2.25    METS  2.28  2.21    RPE  15  13    Perceived Dyspnea   0  -    VO2 Peak  7.99  7.75    Symptoms  No  -    Resting HR  55 bpm  74 bpm    Resting BP   128/62  138/68    Resting Oxygen Saturation   100 %  99 %    Exercise Oxygen Saturation  during 6 min walk  100 %  99 %    Max Ex. HR  102 bpm  80 bpm    Max Ex. BP  160/64  180/62    2 Minute Post BP  128/58  -       Oxygen Initial Assessment:   Oxygen Re-Evaluation:   Oxygen Discharge (Final Oxygen Re-Evaluation):   Initial Exercise Prescription: Initial Exercise Prescription - 07/08/17 1500      Date of Initial Exercise RX and Referring Provider   Date  07/08/17    Referring Provider  Arida      Treadmill   MPH  2    Grade  0    Minutes  15    METs  2.5      Recumbant Bike   Level  1    RPM  60    Watts  8    Minutes  15    METs  2.2      NuStep   Level  2    SPM  80    Minutes  15    METs  2.2      Prescription Details   Frequency (times per week)  3    Duration  Progress to 45 minutes of aerobic exercise without signs/symptoms of physical distress      Intensity   THRR 40-80% of Max Heartrate  89-125    Ratings of Perceived Exertion  11-13    Perceived Dyspnea  0-4      Resistance Training   Training Prescription  Yes    Weight  3 lb    Reps  10-15       Perform Capillary Blood Glucose checks as needed.  Exercise Prescription Changes: Exercise Prescription Changes    Row Name 07/08/17 1500 07/28/17 1200 08/05/17 1600 08/10/17 1100 08/26/17 1400     Response to Exercise   Blood Pressure (Admit)  128/62  122/68  -  132/80  132/62   Blood Pressure (Exercise)  160/64  168/82  -  118/64  -   Blood Pressure (Exit)  128/58  130/80  -  122/80  144/68   Heart Rate (Admit)  68 bpm  81 bpm  -  69 bpm  80 bpm   Heart Rate (Exercise)  102 bpm  104 bpm  -  106 bpm  113 bpm   Heart Rate (Exit)  60 bpm  69 bpm  -  60 bpm  82 bpm   Oxygen Saturation (Admit)  100 %  -  -  -  -   Rating of Perceived Exertion (Exercise)  15  11  -  13  12   Symptoms  -  none  -  none  none   Duration  -  Progress to 45 minutes of aerobic exercise without signs/symptoms of  physical distress  -  Continue with 45 min of aerobic exercise without signs/symptoms of physical distress.  Continue with 45 min of aerobic exercise without signs/symptoms of physical distress.   Intensity  -  THRR unchanged  -  THRR unchanged  THRR unchanged     Progression   Progression  -  Continue to progress workloads to maintain intensity without signs/symptoms of physical distress.  -  Continue to progress workloads to maintain intensity without signs/symptoms of physical distress.  Continue to progress workloads to maintain intensity without signs/symptoms of physical distress.   Average METs  -  2.3  -  2.25  -     Resistance Training   Training Prescription  -  Yes  -  Yes  Yes   Weight  -  3 lb  -  3 lb  3 lb   Reps  -  10-15  -  10-15  10-15     Interval Training   Interval Training  -  No  -  -  No     Treadmill   MPH  -  2  -  2  -   Grade  -  0  -  0  -   Minutes  -  15  -  15  -   METs  -  2.5  -  2.5  -     NuStep   Level  -  2  -  -  2   SPM  -  80  -  -  80   Minutes  -  15  -  -  15   METs  -  2.1  -  -  -     Biostep-RELP   Level  -  -  -  1  -   SPM  -  -  -  50  -   Minutes  -  -  -  15  -   METs  -  -  -  2  -     Home Exercise Plan   Plans to continue exercise at  -  -  [employer] (comment) tennis and walking at home  [employer] (comment) tennis and walking at home  -   Frequency  -  -  Add 3 additional days to program exercise sessions.  Add 3 additional days to program exercise sessions.  -   Initial Home Exercises Provided  -  -  08/05/17  08/05/17  -   [place] Name 09/08/17 1100             Response to Exercise   Blood Pressure (Admit)  126/68       Blood Pressure (Exercise)  162/84       Blood Pressure (Exit)  142/68  Heart Rate (Admit)  78 bpm       Heart Rate (Exercise)  100 bpm       Heart Rate (Exit)  61 bpm       Rating of Perceived Exertion (Exercise)  13       Symptoms  none       Duration  Continue with 45 min  of aerobic exercise without signs/symptoms of physical distress.       Intensity  THRR unchanged         Progression   Progression  Continue to progress workloads to maintain intensity without signs/symptoms of physical distress.       Average METs  2.53         Resistance Training   Training Prescription  Yes       Weight  3 lb       Reps  10-15         Interval Training   Interval Training  No         Treadmill   MPH  2       Grade  0       Minutes  15       METs  2.5         Home Exercise Plan   Plans to continue exercise at  [employer] (comment) tennis and walking at home       Frequency  Add 3 additional days to program exercise sessions.       Initial Home Exercises Provided  08/05/17          Exercise Comments: Exercise Comments    Row Name 07/19/17 1626           Exercise Comments   First full day of exercise!  Patient was oriented to gym and equipment including functions, settings, policies, and procedures.  Patient's individual exercise prescription and treatment plan were reviewed.  All starting workloads were established based on the results of the 6 minute walk test done at initial orientation visit.  The plan for exercise progression was also introduced and progression will be customized based on patie          Exercise Goals and Review: Exercise Goals    Row Name 07/08/17 1522             Exercise Goals   Increase Physical Activity  Yes       Intervention  Provide advice, education, support and counseling about physical activity/exercise needs.;Develop an individualized exercise prescription for aerobic and resistive training based on initial evaluation findings, risk stratification, comorbidities and participant's personal goals.       Expected Outcomes  Short Term: Attend rehab on a regular basis to increase amount of physical activity.;Long Term: Add in home exercise to make exercise part of routine and to increase amount of physical  activity.;Long Term: Exercising regularly at least 3-5 days a week.       Increase Strength and Stamina  Yes       Intervention  Provide advice, education, support and counseling about physical activity/exercise needs.;Develop an individualized exercise prescription for aerobic and resistive training based on initial evaluation findings, risk stratification, comorbidities and participant's personal goals.       Expected Outcomes  Short Term: Increase workloads from initial exercise prescription for resistance, speed, and METs.;Short Term: Perform resistance training exercises routinely during rehab and add in resistance training at home;Long Term: Improve cardiorespiratory fitness, muscular endurance and strength as measured by increased METs  and functional capacity (6MWT)       Able to understand and use rate of perceived exertion (RPE) scale  Yes       Intervention  Provide education and explanation on how to use RPE scale       Expected Outcomes  Short Term: Able to use RPE daily in rehab to express subjective intensity level;Long Term:  Able to use RPE to guide intensity level when exercising independently       Able to understand and use Dyspnea scale  Yes       Intervention  Provide education and explanation on how to use Dyspnea scale       Expected Outcomes  Short Term: Able to use Dyspnea scale daily in rehab to express subjective sense of shortness of breath during exertion;Long Term: Able to use Dyspnea scale to guide intensity level when exercising independently       Knowledge and understanding of Target Heart Rate Range (THRR)  Yes       Intervention  Provide education and explanation of THRR including how the numbers were predicted and where they are located for reference       Expected Outcomes  Short Term: Able to state/look up THRR;Long Term: Able to use THRR to govern intensity when exercising independently;Short Term: Able to use daily as guideline for intensity in rehab       Able  to check pulse independently  Yes       Intervention  Provide education and demonstration on how to check pulse in carotid and radial arteries.;Review the importance of being able to check your own pulse for safety during independent exercise       Expected Outcomes  Short Term: Able to explain why pulse checking is important during independent exercise;Long Term: Able to check pulse independently and accurately       Understanding of Exercise Prescription  Yes       Intervention  Provide education, explanation, and written materials on patient's individual exercise prescription       Expected Outcomes  Short Term: Able to explain program exercise prescription;Long Term: Able to explain home exercise prescription to exercise independently          Exercise Goals Re-Evaluation : Exercise Goals Re-Evaluation    Row Name 07/19/17 1627 07/28/17 1232 08/05/17 1646 08/10/17 1117 08/26/17 1457     Exercise Goal Re-Evaluation   Exercise Goals Review  Increase Physical Activity;Increase Strength and Stamina;Able to understand and use rate of perceived exertion (RPE) scale;Knowledge and understanding of Target Heart Rate Range (THRR);Understanding of Exercise Prescription  Increase Physical Activity;Increase Strength and Stamina;Able to understand and use rate of perceived exertion (RPE) scale  Increase Physical Activity;Increase Strength and Stamina  Increase Physical Activity;Increase Strength and Stamina;Able to understand and use rate of perceived exertion (RPE) scale  Increase Physical Activity;Increase Strength and Stamina;Able to understand and use rate of perceived exertion (RPE) scale   Comments  Reviewed RPE scale, THR and program prescription with pt today.  Pt voiced understanding and was given a copy of goals to take home.   Myeisha is tolerating exercise well.  Staff will continue to monitor.  Reviewed home exercise with pt today.  Pt plans to play tennis 3 times a week and walk for exercise.   Reviewed THR, pulse, RPE, sign and symptoms, NTG use, and when to call 911 or MD.  Also discussed weather considerations and indoor options.  Pt voiced understanding.  Tria plays tennis and would  like to get back to this by spring.  Her biggest obstacle at this time is chronic knee pain, although it doesnt hurt on the TM.  She does have a brace which she wears sometimes to exercise.  She is getting an injection soon.   She has very poor knee alignment as observed while she exercises.  Staff will continue to monitor   Pt is tolerating exercise well.  Staff will continue to monitor   Expected Outcomes  Short - Use RPE daily to regulate intensity  Long - follow program prescription in [place] will attend regularly Long - Liza will increase MET level  Short - play tennis 3 days a week and walk at home. Long - maintain exercise independently  Short - Pt will continue to attend and will build strength and endurance in legs to help with knee pain Long - Pt will reach goal of getting back to tennis   Short - Pt will continue to attend Long - Pt will exercise on her own   [place] Name 09/08/17 1201 09/09/17 1714           Exercise Goal Re-Evaluation   Exercise Goals Review  Increase Physical Activity;Increase Strength and Stamina;Able to understand and use rate of perceived exertion (RPE) scale  Increase Physical Activity;Increase Strength and Stamina      Comments  Takya is progressing well with exercise.  She was cleared by her Dr to play tennis and has played a couple times.  She still has trouble with her knee an dstaff continues to monitor.  6 min walk done today. See data sheet for detailed report.       Expected Outcomes  Short - Devyn will continue to attend class Long - nakayla will maintain exercise on her own  Short: Upon graduation Areej plans to join [employer]  classes. Long: Continue to become more independent with a healthy lifestyle and exercise routine.          Discharge  Exercise Prescription (Final Exercise Prescription Changes): Exercise Prescription Changes - 09/08/17 1100      Response to Exercise   Blood Pressure (Admit)  126/68    Blood Pressure (Exercise)  162/84    Blood Pressure (Exit)  142/68    Heart Rate (Admit)  78 bpm    Heart Rate (Exercise)  100 bpm    Heart Rate (Exit)  61 bpm    Rating of Perceived Exertion (Exercise)  13    Symptoms  none    Duration  Continue with 45 min of aerobic exercise without signs/symptoms of physical distress.    Intensity  THRR unchanged      Progression   Progression  Continue to progress workloads to maintain intensity without signs/symptoms of physical distress.    Average METs  2.53      Resistance Training   Training Prescription  Yes    Weight  3 lb    Reps  10-15      Interval Training   Interval Training  No      Treadmill   MPH  2    Grade  0    Minutes  15    METs  2.5      Home Exercise Plan   Plans to continue exercise at  [employer] (comment) tennis and walking at home    Frequency  Add 3 additional days to program exercise sessions.    Initial Home Exercises Provided  08/05/17  Nutrition:  Target Goals: Understanding of nutrition guidelines, daily intake of sodium <1554m, cholesterol <2064m calories 30% from fat and 7% or less from saturated fats, daily to have 5 or more servings of fruits and vegetables.  Biometrics: Pre Biometrics - 07/08/17 1521      Pre Biometrics   Height  5' 6"  (1.676 m)    Weight  185 lb 9.6 oz (84.2 kg)    Waist Circumference  37 inches    Hip Circumference  47 inches    Waist to Hip Ratio  0.79 %    BMI (Calculated)  29.97    Single Leg Stand  4.93 seconds      Post Biometrics - 09/09/17 1713       Post  Biometrics   Height  5' 6"  (1.676 m)    Weight  183 lb 12.8 oz (83.4 kg)    Waist Circumference  34.5 inches    Hip Circumference  46 inches    Waist to Hip Ratio  0.75 %    BMI (Calculated)  29.68    Single Leg Stand   3.12 seconds       Nutrition Therapy Plan and Nutrition Goals:   Nutrition Assessments: Nutrition Assessments - 07/08/17 1457      MEDFICTS Scores   Pre Score  16       Nutrition Goals Re-Evaluation: Nutrition Goals Re-Evaluation    [place] 09/01/17 1651             Goals   Current Weight  181 lb (82.1 kg)       Nutrition Goal  Eating heart healthy       Comment  BaJacalynaid she doesn't need to meet with the dietician since she knows what to eat but doesn't always eat healthy. Goes to a church dinner every Wed night since it only cost $4       Expected Outcome  Heart healthy eating.           Nutrition Goals Discharge (Final Nutrition Goals Re-Evaluation): Nutrition Goals Re-Evaluation - 09/01/17 1651      Goals   Current Weight  181 lb (82.1 kg)    Nutrition Goal  Eating heart healthy    Comment  BaLasyaaid she doesn't need to meet with the dietician since she knows what to eat but doesn't always eat healthy. Goes to a church dinner every Wed night since it only cost $4    Expected Outcome  Heart healthy eating.        Psychosocial: Target Goals: Acknowledge presence or absence of significant depression and/or stress, maximize coping skills, provide positive support system. Participant is able to verbalize types and ability to use techniques and skills needed for reducing stress and depression.   Initial Review & Psychosocial Screening: Initial Psych Review & Screening - 07/08/17 1457      Initial Review   Current issues with  Current Depression;Current Stress Concerns    Source of Stress Concerns  Unable to participate in former interests or hobbies;Financial    Comments  She and her husband are under financial stress related to their health problems. Her zoloft was recently increased. She said her depression currently is steming from "what could of been"      [place] Yes      Screening Interventions   Interventions   Encouraged to exercise;Program counselor consult;To provide support and resources with identified psychosocial needs    Expected  Outcomes  Short Term goal: Utilizing psychosocial counselor, staff and physician to assist with identification of specific Stressors or current issues interfering with healing process. Setting desired goal for each stressor or current issue identified.;Long Term Goal: Stressors or current issues are controlled or eliminated.;Short Term goal: Identification and review with participant of any Quality of Life or Depression concerns found by scoring the questionnaire.;Long Term goal: The participant improves quality of Life and PHQ9 Scores as seen by post scores and/or verbalization of changes       Quality of Life Scores:  Quality of Life - 07/08/17 1459      Quality of Life Scores   Health/Function Pre  20.4 %    Socioeconomic Pre  20.25 %    Psych/Spiritual Pre  20 %    Family Pre  20.8 %    GLOBAL Pre  20.34 %      Scores of 19 and below usually indicate a poorer quality of life in these areas.  A difference of  2-3 points is a clinically meaningful difference.  A difference of 2-3 points in the total score of the Quality of Life Index has been associated with significant improvement in overall quality of life, self-image, physical symptoms, and general health in studies assessing change in quality of life.  PHQ-9: Recent Review Flowsheet Data    Depression screen [facility] 2/9 08/11/2017 07/08/2017   Decreased Interest 1 2   Down, Depressed, Hopeless 1 1   PHQ - 2 Score 2 3   Altered sleeping 0 1   Tired, decreased energy 1 1   Change in appetite 0 0   Feeling bad or failure about yourself  2 3   Trouble concentrating 0 0   Moving slowly or fidgety/restless 0 0   Suicidal thoughts 0 0   PHQ-9 Score 5 8   Difficult doing work/chores Not difficult at all Somewhat difficult     Interpretation of Total Score  Total Score Depression Severity:  1-4 = Minimal  depression, 5-9 = Mild depression, 10-14 = Moderate depression, 15-19 = Moderately severe depression, 20-27 = Severe depression   Psychosocial Evaluation and Intervention: Psychosocial Evaluation - 07/19/17 1703      Psychosocial Evaluation & Interventions   Interventions  Encouraged to exercise with the program and follow exercise prescription;Relaxation education;Stress management education    Comments  Counselor met with Ms. Ditmer Pamala Hurry) for initial psychosocial evaluation.  She is a 77 year old who had a heart attack on Christmas Eve.  She has a strong support system with a spouse of over 99 years; a daughter who lives locally; good neighbors and some involvement in her local church.  Terran had a stroke 1.5 years ago but has mostly recovered from this.  She sleeps well and has a fair appetite.  She denies a history of depression or anxiety or any current symptoms.  Her mood is generally stable and positive.  Veola reports having multiple stressors in her life at this time with her health; finances and conflict with her spouse at times who is a "control freak."  She has goals to increase her stamina and strength.  Brendolyn reports her dog and playing tennis are her stress reducers currently in her life.  Staff will follow with her throughout the course of this program.      Expected Outcomes  Dashawna will benefit from consistent exercise to achieve her stated goals.  The educational and psychoeducational components of this program will be helpful for  understanding and coping more positively with her condition and the stress in her life currently.      Continue Psychosocial Services   Follow up required by staff       Psychosocial Re-Evaluation: Psychosocial Re-Evaluation    [place] Name 08/12/17 7564 09/01/17 1656           Psychosocial Re-Evaluation   Current issues with  Current Stress Concerns  -      Comments  Gordie states her husband is the stress in their home.  She feel exercise  has helped her deal with stress.   Exercise is helping with her stress. Raford Pitcher said "this program is beneficial".       Interventions  Encouraged to attend Cardiac Rehabilitation for the exercise  Encouraged to attend Cardiac Rehabilitation for the exercise      Continue Psychosocial Services   Follow up required by staff  -      Comments  -  Her step daughter will be visiting this week so it will be a little more stressful she said.          Psychosocial Discharge (Final Psychosocial Re-Evaluation): Psychosocial Re-Evaluation - 09/01/17 1656      Psychosocial Re-Evaluation   Comments  Exercise is helping with her stress. Raford Pitcher said "this program is beneficial".     Interventions  Encouraged to attend Cardiac Rehabilitation for the exercise    Comments  Her step daughter will be visiting this week so it will be a little more stressful she said.        Vocational Rehabilitation: Provide vocational rehab assistance to qualifying candidates.   Vocational Rehab Evaluation & Intervention: Vocational Rehab - 07/08/17 1500      Initial Vocational Rehab Evaluation & Intervention   Assessment shows need for Vocational Rehabilitation  No       Education: Education Goals: Education classes will be provided on a variety of topics geared toward better understanding of heart health and risk factor modification. Participant will state understanding/return demonstration of topics presented as noted by education test scores.  Learning Barriers/Preferences: Learning Barriers/Preferences - 07/08/17 1500      Learning Barriers/Preferences   Learning Barriers  None    Learning Preferences  None       Education Topics:  AED/CPR: - Group verbal and written instruction with the use of models to demonstrate the basic use of the AED with the basic ABC's of resuscitation.   Cardiac Rehab from 09/13/2017 in [facility] Cardiac and Pulmonary Rehab  Date  08/02/17  Educator  MA  Instruction Review Code  1-  Verbalizes Understanding      General Nutrition Guidelines/Fats and Fiber: -Group instruction provided by verbal, written material, models and posters to present the general guidelines for heart healthy nutrition. Gives an explanation and review of dietary fats and fiber.   Cardiac Rehab from 09/13/2017 in [facility] Cardiac and Pulmonary Rehab  Date  09/13/17  Educator  PI  Instruction Review Code  1- Verbalizes Understanding      Controlling Sodium/Reading Food Labels: -Group verbal and written material supporting the discussion of sodium use in heart healthy nutrition. Review and explanation with models, verbal and written materials for utilization of the food label.   Cardiac Rehab from 09/13/2017 in [facility] Cardiac and Pulmonary Rehab  Date  07/26/17  Educator  PI  Instruction Review Code  1- Verbalizes Understanding      Exercise Physiology & General Exercise Guidelines: - Group verbal and written instruction with models  to review the exercise physiology of the cardiovascular system and associated critical values. Provides general exercise guidelines with specific guidelines to those with heart or lung disease.    Cardiac Rehab from 09/13/2017 in [facility] Cardiac and Pulmonary Rehab  Date  08/09/17  Educator  [facility]  Instruction Review Code  1- Verbalizes Understanding      Aerobic Exercise & Resistance Training: - Gives group verbal and written instruction on the various components of exercise. Focuses on aerobic and resistive training programs and the benefits of this training and how to safely progress through these programs..   Cardiac Rehab from 09/13/2017 in [facility] Cardiac and Pulmonary Rehab  Date  08/16/17  Educator  AS  Instruction Review Code  1- Verbalizes Understanding      Flexibility, Balance, Mind/Body Relaxation: Provides group verbal/written instruction on the benefits of flexibility and balance training, including mind/body exercise modes such as yoga, pilates and tai chi.   Demonstration and skill practice provided.   Cardiac Rehab from 09/13/2017 in [facility] Cardiac and Pulmonary Rehab  Date  08/18/17  Educator  AS  Instruction Review Code  1- Verbalizes Understanding      Stress and Anxiety: - Provides group verbal and written instruction about the health risks of elevated stress and causes of high stress.  Discuss the correlation between heart/lung disease and anxiety and treatment options. Review healthy ways to manage with stress and anxiety.   Cardiac Rehab from 09/13/2017 in [facility] Cardiac and Pulmonary Rehab  Date  08/25/17  Educator  [facility]  Instruction Review Code  1- Verbalizes Understanding      Depression: - Provides group verbal and written instruction on the correlation between heart/lung disease and depressed mood, treatment options, and the stigmas associated with seeking treatment.   Cardiac Rehab from 09/13/2017 in [facility] Cardiac and Pulmonary Rehab  Date  08/11/17  Educator  [facility]  Instruction Review Code  1- Verbalizes Understanding      Anatomy & Physiology of the Heart: - Group verbal and written instruction and models provide basic cardiac anatomy and physiology, with the coronary electrical and arterial systems. Review of Valvular disease and Heart Failure   Cardiac Rehab from 09/13/2017 in [facility] Cardiac and Pulmonary Rehab  Date  09/06/17  Educator  CE  Instruction Review Code  1- Verbalizes Understanding      Cardiac Procedures: - Group verbal and written instruction to review commonly prescribed medications for heart disease. Reviews the medication, class of the drug, and side effects. Includes the steps to properly store meds and maintain the prescription regimen. (beta blockers and nitrates)   Cardiac Rehab from 09/13/2017 in [facility] Cardiac and Pulmonary Rehab  Date  07/21/17  Educator  [facility]  Instruction Review Code  5- Refused Teaching [had to leave early]      Cardiac Medications I: - Group verbal and written instruction to review  commonly prescribed medications for heart disease. Reviews the medication, class of the drug, and side effects. Includes the steps to properly store meds and maintain the prescription regimen.   Cardiac Rehab from 09/13/2017 in [facility] Cardiac and Pulmonary Rehab  Date  08/30/17 [09/01/2017 Cardiac Meds part 2 [facility]  Educator  CE  Instruction Review Code  1- Verbalizes Understanding      Cardiac Medications II: -Group verbal and written instruction to review commonly prescribed medications for heart disease. Reviews the medication, class of the drug, and side effects. (all other drug classes)    Go Sex-Intimacy & Heart Disease, Get  SMART - Goal Setting: - Group verbal and written instruction through game format to discuss heart disease and the return to sexual intimacy. Provides group verbal and written material to discuss and apply goal setting through the application of the S.M.A.R.T. Method.   Cardiac Rehab from 09/13/2017 in [facility] Cardiac and Pulmonary Rehab  Date  07/21/17  Educator  [facility]  Instruction Review Code  5- Refused Teaching [had to leave early]      Other Matters of the Heart: - Provides group verbal, written materials and models to describe Stable Angina and Peripheral Artery. Includes description of the disease process and treatment options available to the cardiac patient.   Exercise & Equipment Safety: - Individual verbal instruction and demonstration of equipment use and safety with use of the equipment.   Infection Prevention: - Provides verbal and written material to individual with discussion of infection control including proper hand washing and proper equipment cleaning during exercise session.   Falls Prevention: - Provides verbal and written material to individual with discussion of falls prevention and safety.   Diabetes: - Individual verbal and written instruction to review signs/symptoms of diabetes, desired ranges of glucose level fasting,  after meals and with exercise. Acknowledge that pre and post exercise glucose checks will be done for 3 sessions at entry of program.   Know Your Numbers and Risk Factors: -Group verbal and written instruction about important numbers in your health.  Discussion of what are risk factors and how they play a role in the disease process.  Review of Cholesterol, Blood Pressure, Diabetes, and BMI and the role they play in your overall health.   Sleep Hygiene: -Provides group verbal and written instruction about how sleep can affect your health.  Define sleep hygiene, discuss sleep cycles and impact of sleep habits. Review good sleep hygiene tips.    Cardiac Rehab from 09/13/2017 in [facility] Cardiac and Pulmonary Rehab  Date  09/08/17  Educator  [facility]  Instruction Review Code  1- Verbalizes Understanding      Other: -Provides group and verbal instruction on various topics (see comments)   Knowledge Questionnaire Score: Knowledge Questionnaire Score - 07/08/17 1215      Knowledge Questionnaire Score   Pre Score  21/28 correct answers reviewed with Pamala Hurry       Core Components/Risk Factors/Patient Goals at Admission: Personal Goals and Risk Factors at Admission - 07/08/17 1454      Core Components/Risk Factors/Patient Goals on Admission    Weight Management  Yes;Obesity    Intervention  Weight Management: Develop a combined nutrition and exercise program designed to reach desired caloric intake, while maintaining appropriate intake of nutrient and fiber, sodium and fats, and appropriate energy expenditure required for the weight goal.;Weight Management: Provide education and appropriate resources to help participant work on and attain dietary goals.;Weight Management/Obesity: Establish reasonable short term and long term weight goals.;Obesity: Provide education and appropriate resources to help participant work on and attain dietary goals.    Admit Weight  185 lb (83.9 kg)    Goal Weight: Short  Term  180 lb (81.6 kg)    Goal Weight: Long Term  160 lb (72.6 kg)    Expected Outcomes  Short Term: Continue to assess and modify interventions until short term weight is achieved;Long Term: Adherence to nutrition and physical activity/exercise program aimed toward attainment of established weight goal;Weight Loss: Understanding of general recommendations for a balanced deficit meal plan, which promotes 1-2 lb weight loss per week and includes a  negative energy balance of [phone] kcal/d;Understanding recommendations for meals to include 15-35% energy as protein, 25-35% energy from fat, 35-60% energy from carbohydrates, less than 233m of dietary cholesterol, 20-35 gm of total fiber daily;Understanding of distribution of calorie intake throughout the day with the consumption of 4-5 meals/snacks    Hypertension  Yes    Intervention  Provide education on lifestyle modifcations including regular physical activity/exercise, weight management, moderate sodium restriction and increased consumption of fresh fruit, vegetables, and low fat dairy, alcohol moderation, and smoking cessation.;Monitor prescription use compliance.    Expected Outcomes  Short Term: Continued assessment and intervention until BP is < 140/919mHG in hypertensive participants. < 130/8035mG in hypertensive participants with diabetes, heart failure or chronic kidney disease.;Long Term: Maintenance of blood pressure at goal levels.    Lipids  Yes    Intervention  Provide education and support for participant on nutrition & aerobic/resistive exercise along with prescribed medications to achieve LDL <64m61mDL >40mg21m Expected Outcomes  Short Term: Participant states understanding of desired cholesterol values and is compliant with medications prescribed. Participant is following exercise prescription and nutrition guidelines.;Long Term: Cholesterol controlled with medications as prescribed, with individualized exercise RX and with personalized  nutrition plan. Value goals: LDL < 64mg,34m > 40 mg.    Stress  Yes BarbarMilcas she is experiencing financial stress. She and her 95 yea26old husband have had a lot of health issues over the last couple years. She is thinking of going back to work     IntervNurse, mental healthidual and/or small group education and counseling on adjustment to heart disease, stress management and health-related lifestyle change. Teach and support self-help strategies.;Refer participants experiencing significant psychosocial distress to appropriate mental health specialists for further evaluation and treatment. When possible, include family members and significant others in education/counseling sessions.    Expected Outcomes  Short Term: Participant demonstrates changes in health-related behavior, relaxation and other stress management skills, ability to obtain effective social support, and compliance with psychotropic medications if prescribed.;Long Term: Emotional wellbeing is indicated by absence of clinically significant psychosocial distress or social isolation.       Core Components/Risk Factors/Patient Goals Review:  Goals and Risk Factor Review    Row Name 08/12/17 1648 09/01/17 1655           Core Components/Risk Factors/Patient Goals Review   Personal Goals Review  Hypertension;Lipids;Weight Management/Obesity  Weight Management/Obesity;Hypertension;Stress      Review  Dr said her cholesterol was high in spite of long term statin use.  She is trying a new medication.  She has appt with RD to discuss nutrition.  Barb sRaford Pitchershe will have increased stress this week since her stepdaughter will be visiting. Cholestrol blood work will be checked. WEight is down today to 181lbs       Expected Outcomes  Short - meet with RD and follow dietary recommendations Long - Gain better control of cholesterol with diet exercise and meds  Heart healthy living         Core Components/Risk Factors/Patient Goals at  Discharge (Final Review):  Goals and Risk Factor Review - 09/01/17 1655      Core Components/Risk Factors/Patient Goals Review   Personal Goals Review  Weight Management/Obesity;Hypertension;Stress    Review  Barb sRaford Pitchershe will have increased stress this week since her stepdaughter will be visiting. Cholestrol blood work will be checked. WEight is down today to 181lbs     Expected Outcomes  Heart healthy living       ITP Comments: ITP Comments    Row Name 07/08/17 1452 07/21/17 0632 08/18/17 0557 09/13/17 1646 09/15/17 0645   ITP Comments  Med Review completed. Initial ITP created. Diagnosis can be found in [facility] encounter 06/16/17  30 Day review. Continue with ITP unless directed changes per Medical Director review.   New to program  30 day review. Continue with ITP unless directed changes per Medical Director review.  Vernita Tague did not complete her rehab session.  Kailany was in the ER last night with what was ruled indigestion. She also got cortisone shots in her knee today and her doctor told her not to do too much today. She stayed to listen to education and left without exercising. She plans to come back to the next exercise session.   30 Day review. Continue with ITP unless directed changes per Medical Director review.        Comments:

## 2017-09-16 ENCOUNTER — Encounter: Payer: Self-pay | Admitting: *Deleted

## 2017-09-16 DIAGNOSIS — I252 Old myocardial infarction: Secondary | ICD-10-CM | POA: Diagnosis not present

## 2017-09-16 DIAGNOSIS — I214 Non-ST elevation (NSTEMI) myocardial infarction: Secondary | ICD-10-CM

## 2017-09-16 NOTE — Progress Notes (Signed)
Cardiac Individual Treatment Plan  Patient Details  Name: Margaret [name] MRN: [id number] Date of Birth: 01-30-1941 Referring Provider:     Cardiac Rehab from 07/08/2017 in [facility] Cardiac and Pulmonary Rehab  Referring Provider  Arida      Initial Encounter Date:    Cardiac Rehab from 07/08/2017 in [facility] Cardiac and Pulmonary Rehab  Date  07/08/17  Referring Provider  Fletcher Anon      Visit Diagnosis: NSTEMI (non-ST elevated myocardial infarction) [facility])  Patient's Home Medications on Admission:  Current Outpatient Medications:  .  amLODipine (NORVASC) 5 MG tablet, Take 1 tablet (5 mg total) by mouth daily., Disp: 90 tablet, Rfl: 3 .  aspirin EC 81 MG tablet, Take 1 tablet (81 mg total) by mouth daily., Disp: 90 tablet, Rfl: 3 .  atorvastatin (LIPITOR) 80 MG tablet, Take 80 mg by mouth daily., Disp: , Rfl:  .  carvedilol (COREG) 6.25 MG tablet, Take 1 tablet (6.25 mg total) by mouth 2 (two) times daily with a meal., Disp: 60 tablet, Rfl: 1 .  Docusate Calcium (STOOL SOFTENER PO), Take by mouth. As needed, Disp: , Rfl:  .  ezetimibe (ZETIA) 10 MG tablet, Take 1 tablet (10 mg total) by mouth daily., Disp: 30 tablet, Rfl: 5 .  losartan (COZAAR) 50 MG tablet, Take 1 tablet (50 mg total) by mouth daily., Disp: 30 tablet, Rfl: 1 .  nitroGLYCERIN (NITROSTAT) 0.4 MG SL tablet, Place 1 tablet (0.4 mg total) under the tongue every 5 (five) minutes as needed for chest pain., Disp: 30 tablet, Rfl: 2 .  pantoprazole (PROTONIX) 40 MG tablet, Take 40 mg by mouth daily., Disp: , Rfl:  .  sertraline (ZOLOFT) 25 MG tablet, Take 25 mg by mouth daily., Disp: , Rfl:  .  ticagrelor (BRILINTA) 90 MG TABS tablet, Take 1 tablet (90 mg total) by mouth 2 (two) times daily., Disp: 60 tablet, Rfl: 5  Past Medical History: Past Medical History:  Diagnosis Date  . Anxiety and depression   . CAD (coronary artery disease)   . Cellulitis of right leg 2005  . Colon polyp   . Dyspnea on exertion   . GERD (gastroesophageal  reflux disease)   . Hyperlipidemia   . Hypertension   . Morbid obesity ([place])   . Presence of stent in right coronary artery 02/08/12   2.5x18 Xience distal RCA  . Right shoulder pain   . Stroke ([place])   . Vaginal prolapse     Tobacco Use: Social History   Tobacco Use  Smoking Status Former Smoker  . Types: Cigarettes  . Last attempt to quit: 06/22/1984  . Years since quitting: 33.2  Smokeless Tobacco Never Used    Labs: Recent Review [facility] for [facility] Cardiac and Pulmonary Rehab Latest Ref Rng & Units 06/15/2017   Cholestrol 0 - 200 mg/dL 225(H)   LDLCALC 0 - 99 mg/dL 146(H)   HDL >40 mg/dL 60   Trlycerides <150 mg/dL 96       Exercise Target Goals:    Exercise Program Goal: Individual exercise prescription set using results from initial 6 min walk test and THRR while considering  patient's activity barriers and safety.   Exercise Prescription Goal: Initial exercise prescription builds to 30-45 minutes a day of aerobic activity, 2-3 days per week.  Home exercise guidelines will be given to patient during program as part of exercise prescription that the participant will acknowledge.  Activity Barriers & Risk Stratification: Activity Barriers & Cardiac  Risk Stratification - 07/08/17 1502      Activity Barriers & Cardiac Risk Stratification   Activity Barriers  Joint Problems left knee pain, receives injections for    Cardiac Risk Stratification  Moderate       6 Minute Walk: 6 Minute Walk    Row Name 07/08/17 1522 09/09/17 1711       6 Minute Walk   Phase  -  Discharge    Distance  1150 feet  1186 feet    Distance % Change  -  3.1 %    Distance Feet Change  -  36 ft    Walk Time  6 minutes  6 minutes    # of Rest Breaks  0  0    MPH  2.18  2.25    METS  2.28  2.21    RPE  15  13    Perceived Dyspnea   0  -    VO2 Peak  7.99  7.75    Symptoms  No  -    Resting HR  55 bpm  74 bpm    Resting BP  128/62  138/68    Resting Oxygen Saturation    100 %  99 %    Exercise Oxygen Saturation  during 6 min walk  100 %  99 %    Max Ex. HR  102 bpm  80 bpm    Max Ex. BP  160/64  180/62    2 Minute Post BP  128/58  -       Oxygen Initial Assessment:   Oxygen Re-Evaluation:   Oxygen Discharge (Final Oxygen Re-Evaluation):   Initial Exercise Prescription: Initial Exercise Prescription - 07/08/17 1500      Date of Initial Exercise RX and Referring Provider   Date  07/08/17    Referring Provider  Arida      Treadmill   MPH  2    Grade  0    Minutes  15    METs  2.5      Recumbant Bike   Level  1    RPM  60    Watts  8    Minutes  15    METs  2.2      NuStep   Level  2    SPM  80    Minutes  15    METs  2.2      Prescription Details   Frequency (times per week)  3    Duration  Progress to 45 minutes of aerobic exercise without signs/symptoms of physical distress      Intensity   THRR 40-80% of Max Heartrate  89-125    Ratings of Perceived Exertion  11-13    Perceived Dyspnea  0-4      Resistance Training   Training Prescription  Yes    Weight  3 lb    Reps  10-15       Perform Capillary Blood Glucose checks as needed.  Exercise Prescription Changes: Exercise Prescription Changes    Row Name 07/08/17 1500 07/28/17 1200 08/05/17 1600 08/10/17 1100 08/26/17 1400     Response to Exercise   Blood Pressure (Admit)  128/62  122/68  -  132/80  132/62   Blood Pressure (Exercise)  160/64  168/82  -  118/64  -   Blood Pressure (Exit)  128/58  130/80  -  122/80  144/68   Heart Rate (Admit)  68 bpm  81 bpm  -  69 bpm  80 bpm   Heart Rate (Exercise)  102 bpm  104 bpm  -  106 bpm  113 bpm   Heart Rate (Exit)  60 bpm  69 bpm  -  60 bpm  82 bpm   Oxygen Saturation (Admit)  100 %  -  -  -  -   Rating of Perceived Exertion (Exercise)  15  11  -  13  12   Symptoms  -  none  -  none  none   Duration  -  Progress to 45 minutes of aerobic exercise without signs/symptoms of physical distress  -  Continue with 45 min of  aerobic exercise without signs/symptoms of physical distress.  Continue with 45 min of aerobic exercise without signs/symptoms of physical distress.   Intensity  -  THRR unchanged  -  THRR unchanged  THRR unchanged     Progression   Progression  -  Continue to progress workloads to maintain intensity without signs/symptoms of physical distress.  -  Continue to progress workloads to maintain intensity without signs/symptoms of physical distress.  Continue to progress workloads to maintain intensity without signs/symptoms of physical distress.   Average METs  -  2.3  -  2.25  -     Resistance Training   Training Prescription  -  Yes  -  Yes  Yes   Weight  -  3 lb  -  3 lb  3 lb   Reps  -  10-15  -  10-15  10-15     Interval Training   Interval Training  -  No  -  -  No     Treadmill   MPH  -  2  -  2  -   Grade  -  0  -  0  -   Minutes  -  15  -  15  -   METs  -  2.5  -  2.5  -     NuStep   Level  -  2  -  -  2   SPM  -  80  -  -  80   Minutes  -  15  -  -  15   METs  -  2.1  -  -  -     Biostep-RELP   Level  -  -  -  1  -   SPM  -  -  -  50  -   Minutes  -  -  -  15  -   METs  -  -  -  2  -     Home Exercise Plan   Plans to continue exercise at  -  -  [employer] (comment) tennis and walking at home  [employer] (comment) tennis and walking at home  -   Frequency  -  -  Add 3 additional days to program exercise sessions.  Add 3 additional days to program exercise sessions.  -   Initial Home Exercises Provided  -  -  08/05/17  08/05/17  -   [place] Name 09/08/17 1100             Response to Exercise   Blood Pressure (Admit)  126/68       Blood Pressure (Exercise)  162/84       Blood Pressure (Exit)  142/68       Heart Rate (Admit)  78 bpm  Heart Rate (Exercise)  100 bpm       Heart Rate (Exit)  61 bpm       Rating of Perceived Exertion (Exercise)  13       Symptoms  none       Duration  Continue with 45 min of aerobic exercise without signs/symptoms of  physical distress.       Intensity  THRR unchanged         Progression   Progression  Continue to progress workloads to maintain intensity without signs/symptoms of physical distress.       Average METs  2.53         Resistance Training   Training Prescription  Yes       Weight  3 lb       Reps  10-15         Interval Training   Interval Training  No         Treadmill   MPH  2       Grade  0       Minutes  15       METs  2.5         Home Exercise Plan   Plans to continue exercise at  [employer] (comment) tennis and walking at home       Frequency  Add 3 additional days to program exercise sessions.       Initial Home Exercises Provided  08/05/17          Exercise Comments: Exercise Comments    Row Name 07/19/17 1626 09/16/17 1643         Exercise Comments   First full day of exercise!  Patient was oriented to gym and equipment including functions, settings, policies, and procedures.  Patient's individual exercise prescription and treatment plan were reviewed.  All starting workloads were established based on the results of the 6 minute walk test done at initial orientation visit.  The plan for exercise progression was also introduced and progression will be customized based on patie  Margaret [name] graduated today from  rehab with 34 sessions completed.  Details of the patient's exercise prescription and what She needs to do in order to continue the prescription and progress were discussed with patient.  Patient was given a copy of prescription and goals.  Patient verbalized understanding.  Margaret [name] plans to continue to exercise by joining the [employer].         Exercise Goals and Review: Exercise Goals    Row Name 07/08/17 1522             Exercise Goals   Increase Physical Activity  Yes       Intervention  Provide advice, education, support and counseling about physical activity/exercise needs.;Develop an individualized exercise prescription for aerobic and resistive  training based on initial evaluation findings, risk stratification, comorbidities and participant's personal goals.       Expected Outcomes  Short Term: Attend rehab on a regular basis to increase amount of physical activity.;Long Term: Add in home exercise to make exercise part of routine and to increase amount of physical activity.;Long Term: Exercising regularly at least 3-5 days a week.       Increase Strength and Stamina  Yes       Intervention  Provide advice, education, support and counseling about physical activity/exercise needs.;Develop an individualized exercise prescription for aerobic and resistive training based on initial evaluation findings, risk stratification, comorbidities and participant's personal goals.  Expected Outcomes  Short Term: Increase workloads from initial exercise prescription for resistance, speed, and METs.;Short Term: Perform resistance training exercises routinely during rehab and add in resistance training at home;Long Term: Improve cardiorespiratory fitness, muscular endurance and strength as measured by increased METs and functional capacity (6MWT)       Able to understand and use rate of perceived exertion (RPE) scale  Yes       Intervention  Provide education and explanation on how to use RPE scale       Expected Outcomes  Short Term: Able to use RPE daily in rehab to express subjective intensity level;Long Term:  Able to use RPE to guide intensity level when exercising independently       Able to understand and use Dyspnea scale  Yes       Intervention  Provide education and explanation on how to use Dyspnea scale       Expected Outcomes  Short Term: Able to use Dyspnea scale daily in rehab to express subjective sense of shortness of breath during exertion;Long Term: Able to use Dyspnea scale to guide intensity level when exercising independently       Knowledge and understanding of Target Heart Rate Range (THRR)  Yes       Intervention  Provide education  and explanation of THRR including how the numbers were predicted and where they are located for reference       Expected Outcomes  Short Term: Able to state/look up THRR;Long Term: Able to use THRR to govern intensity when exercising independently;Short Term: Able to use daily as guideline for intensity in rehab       Able to check pulse independently  Yes       Intervention  Provide education and demonstration on how to check pulse in carotid and radial arteries.;Review the importance of being able to check your own pulse for safety during independent exercise       Expected Outcomes  Short Term: Able to explain why pulse checking is important during independent exercise;Long Term: Able to check pulse independently and accurately       Understanding of Exercise Prescription  Yes       Intervention  Provide education, explanation, and written materials on patient's individual exercise prescription       Expected Outcomes  Short Term: Able to explain program exercise prescription;Long Term: Able to explain home exercise prescription to exercise independently          Exercise Goals Re-Evaluation : Exercise Goals Re-Evaluation    Row Name 07/19/17 1627 07/28/17 1232 08/05/17 1646 08/10/17 1117 08/26/17 1457     Exercise Goal Re-Evaluation   Exercise Goals Review  Increase Physical Activity;Increase Strength and Stamina;Able to understand and use rate of perceived exertion (RPE) scale;Knowledge and understanding of Target Heart Rate Range (THRR);Understanding of Exercise Prescription  Increase Physical Activity;Increase Strength and Stamina;Able to understand and use rate of perceived exertion (RPE) scale  Increase Physical Activity;Increase Strength and Stamina  Increase Physical Activity;Increase Strength and Stamina;Able to understand and use rate of perceived exertion (RPE) scale  Increase Physical Activity;Increase Strength and Stamina;Able to understand and use rate of perceived exertion (RPE)  scale   Comments  Reviewed RPE scale, THR and program prescription with pt today.  Pt voiced understanding and was given a copy of goals to take home.   Margaret [name] is tolerating exercise well.  Staff will continue to monitor.  Reviewed home exercise with pt today.  Pt plans to play  tennis 3 times a week and walk for exercise.  Reviewed THR, pulse, RPE, sign and symptoms, NTG use, and when to call 911 or MD.  Also discussed weather considerations and indoor options.  Pt voiced understanding.  Margaret [name] plays tennis and would like to get back to this by spring.  Her biggest obstacle at this time is chronic knee pain, although it doesnt hurt on the TM.  She does have a brace which she wears sometimes to exercise.  She is getting an injection soon.   She has very poor knee alignment as observed while she exercises.  Staff will continue to monitor   Pt is tolerating exercise well.  Staff will continue to monitor   Expected Outcomes  Short - Use RPE daily to regulate intensity  Long - follow program prescription in [place] will attend regularly Long - [place] will increase MET level  Short - play tennis 3 days a week and walk at home. Long - maintain exercise independently  Short - Pt will continue to attend and will build strength and endurance in legs to help with knee pain Long - Pt will reach goal of getting back to tennis   Short - Pt will continue to attend Long - Pt will exercise on her own   [place] Name 09/08/17 1201 09/09/17 1714           Exercise Goal Re-Evaluation   Exercise Goals Review  Increase Physical Activity;Increase Strength and Stamina;Able to understand and use rate of perceived exertion (RPE) scale  Increase Physical Activity;Increase Strength and Stamina      Comments  Margaret [name] is progressing well with exercise.  She was cleared by her Dr to play tennis and has played a couple times.  She still has trouble with her knee an dstaff continues to monitor.  6 min walk done today. See data  sheet for detailed report.       Expected Outcomes  Short - Lisa-Marie will continue to attend class Long - Margaret [name] will maintain exercise on her own  Short: Upon graduation Margaret [name] plans to join [employer]  classes. Long: Continue to become more independent with a healthy lifestyle and exercise routine.          Discharge Exercise Prescription (Final Exercise Prescription Changes): Exercise Prescription Changes - 09/08/17 1100      Response to Exercise   Blood Pressure (Admit)  126/68    Blood Pressure (Exercise)  162/84    Blood Pressure (Exit)  142/68    Heart Rate (Admit)  78 bpm    Heart Rate (Exercise)  100 bpm    Heart Rate (Exit)  61 bpm    Rating of Perceived Exertion (Exercise)  13    Symptoms  none    Duration  Continue with 45 min of aerobic exercise without signs/symptoms of physical distress.    Intensity  THRR unchanged      Progression   Progression  Continue to progress workloads to maintain intensity without signs/symptoms of physical distress.    Average METs  2.53      Resistance Training   Training Prescription  Yes    Weight  3 lb    Reps  10-15      Interval Training   Interval Training  No      Treadmill   MPH  2    Grade  0    Minutes  15    METs  2.5  Home Exercise Plan   Plans to continue exercise at  [facility] (comment) tennis and walking at home    Frequency  Add 3 additional days to program exercise sessions.    Initial Home Exercises Provided  08/05/17       Nutrition:  Target Goals: Understanding of nutrition guidelines, daily intake of sodium <1585m, cholesterol <2021m calories 30% from fat and 7% or less from saturated fats, daily to have 5 or more servings of fruits and vegetables.  Biometrics: Pre Biometrics - 07/08/17 1521      Pre Biometrics   Height  5' 6"  (1.676 m)    Weight  185 lb 9.6 oz (84.2 kg)    Waist Circumference  37 inches    Hip Circumference  47 inches    Waist to Hip Ratio  0.79 %    BMI  (Calculated)  29.97    Single Leg Stand  4.93 seconds      Post Biometrics - 09/09/17 1713       Post  Biometrics   Height  5' 6"  (1.676 m)    Weight  183 lb 12.8 oz (83.4 kg)    Waist Circumference  34.5 inches    Hip Circumference  46 inches    Waist to Hip Ratio  0.75 %    BMI (Calculated)  29.68    Single Leg Stand  3.12 seconds       Nutrition Therapy Plan and Nutrition Goals:   Nutrition Assessments: Nutrition Assessments - 07/08/17 1457      MEDFICTS Scores   Pre Score  16       Nutrition Goals Re-Evaluation: Nutrition Goals Re-Evaluation    Ro[place]lie[place] 09/01/17 1651             Goals   Current Weight  181 lb (82.1 kg)       Nutrition Goal  Eating heart healthy       Comment  BaMallikaaid she doesn't need to meet with the dietician since she knows what to eat but doesn't always eat healthy. Goes to a church dinner every Wed night since it only cost $4       Expected Outcome  Heart healthy eating.           Nutrition Goals Discharge (Final Nutrition Goals Re-Evaluation): Nutrition Goals Re-Evaluation - 09/01/17 1651      Goals   Current Weight  181 lb (82.1 kg)    Nutrition Goal  Eating heart healthy    Comment  BaZeenataid she doesn't need to meet with the dietician since she knows what to eat but doesn't always eat healthy. Goes to a church dinner every Wed night since it only cost $4    Expected Outcome  Heart healthy eating.        Psychosocial: Target Goals: Acknowledge presence or absence of significant depression and/or stress, maximize coping skills, provide positive support system. Participant is able to verbalize types and ability to use techniques and skills needed for reducing stress and depression.   Initial Review & Psychosocial Screening: Initial Psych Review & Screening - 07/08/17 1457      Initial Review   Current issues with  Current Depression;Current Stress Concerns    Source of Stress Concerns  Unable to participate in former  interests or hobbies;Financial    Comments  She and her husband are under financial stress related to their health problems. Her zoloft was recently increased. She said her depression currently is steming  from "what could of been"      [place]?  Yes      Screening Interventions   Interventions  Encouraged to exercise;Program counselor consult;To provide support and resources with identified psychosocial needs    Expected Outcomes  Short Term goal: Utilizing psychosocial counselor, staff and physician to assist with identification of specific Stressors or current issues interfering with healing process. Setting desired goal for each stressor or current issue identified.;Long Term Goal: Stressors or current issues are controlled or eliminated.;Short Term goal: Identification and review with participant of any Quality of Life or Depression concerns found by scoring the questionnaire.;Long Term goal: The participant improves quality of Life and PHQ9 Scores as seen by post scores and/or verbalization of changes       Quality of Life Scores:  Quality of Life - 09/16/17 1356      Quality of Life Scores   Health/Function Post  18.36 %    Socioeconomic Post  22.07 %    Psych/Spiritual Post  22.21 %    Family Post  17 %    GLOBAL Post  19.71 %      Scores of 19 and below usually indicate a poorer quality of life in these areas.  A difference of  2-3 points is a clinically meaningful difference.  A difference of 2-3 points in the total score of the Quality of Life Index has been associated with significant improvement in overall quality of life, self-image, physical symptoms, and general health in studies assessing change in quality of life.  PHQ-9: Recent Review Flowsheet Data    Depression screen [facility] 2/9 09/16/2017 08/11/2017 07/08/2017   Decreased Interest 0 1 2   Down, Depressed, Hopeless - 1 1   PHQ - 2 Score 0 2 3   Altered sleeping 0 0 1   Tired, decreased  energy 0 1 1   Change in appetite 0 0 0   Feeling bad or failure about yourself  3 2 3    Trouble concentrating 0 0 0   Moving slowly or fidgety/restless 0 0 0   Suicidal thoughts 0 0 0   PHQ-9 Score 3 5 8    Difficult doing work/chores Not difficult at all Not difficult at all Somewhat difficult     Interpretation of Total Score  Total Score Depression Severity:  1-4 = Minimal depression, 5-9 = Mild depression, 10-14 = Moderate depression, 15-19 = Moderately severe depression, 20-27 = Severe depression   Psychosocial Evaluation and Intervention: Psychosocial Evaluation - 07/19/17 1703      Psychosocial Evaluation & Interventions   Interventions  Encouraged to exercise with the program and follow exercise prescription;Relaxation education;Stress management education    Comments  Counselor met with Ms. Fraleigh Pamala Hurry) for initial psychosocial evaluation.  She is a 77 year old who had a heart attack on Christmas Eve.  She has a strong support system with a spouse of over 56 years; a daughter who lives locally; good neighbors and some involvement in her local church.  Margaret [name] had a stroke 1.5 years ago but has mostly recovered from this.  She sleeps well and has a fair appetite.  She denies a history of depression or anxiety or any current symptoms.  Her mood is generally stable and positive.  Bellami reports having multiple stressors in her life at this time with her health; finances and conflict with her spouse at times who is a "control freak."  She has goals to increase her  stamina and strength.  Derry reports her dog and playing tennis are her stress reducers currently in her life.  Staff will follow with her throughout the course of this program.      Expected Outcomes  Margaret [name] will benefit from consistent exercise to achieve her stated goals.  The educational and psychoeducational components of this program will be helpful for understanding and coping more positively with her condition and the  stress in her life currently.      Continue Psychosocial Services   Follow up required by staff       Psychosocial Re-Evaluation: Psychosocial Re-Evaluation    [place] Name 08/12/17 5885 09/01/17 1656           Psychosocial Re-Evaluation   Current issues with  Current Stress Concerns  -      Comments  Margaret [name] states her husband is the stress in their home.  She feel exercise has helped her deal with stress.   Exercise is helping with her stress. Margaret [name] said "this program is beneficial".       Interventions  Encouraged to attend Cardiac Rehabilitation for the exercise  Encouraged to attend Cardiac Rehabilitation for the exercise      Continue Psychosocial Services   Follow up required by staff  -      Comments  -  Her step daughter will be visiting this week so it will be a little more stressful she said.          Psychosocial Discharge (Final Psychosocial Re-Evaluation): Psychosocial Re-Evaluation - 09/01/17 1656      Psychosocial Re-Evaluation   Comments  Exercise is helping with her stress. Margaret [name] said "this program is beneficial".     Interventions  Encouraged to attend Cardiac Rehabilitation for the exercise    Comments  Her step daughter will be visiting this week so it will be a little more stressful she said.        Vocational Rehabilitation: Provide vocational rehab assistance to qualifying candidates.   Vocational Rehab Evaluation & Intervention: Vocational Rehab - 07/08/17 1500      Initial Vocational Rehab Evaluation & Intervention   Assessment shows need for Vocational Rehabilitation  No       Education: Education Goals: Education classes will be provided on a variety of topics geared toward better understanding of heart health and risk factor modification. Participant will state understanding/return demonstration of topics presented as noted by education test scores.  Learning Barriers/Preferences: Learning Barriers/Preferences - 07/08/17 1500      Learning  Barriers/Preferences   Learning Barriers  None    Learning Preferences  None       Education Topics:  AED/CPR: - Group verbal and written instruction with the use of models to demonstrate the basic use of the AED with the basic ABC's of resuscitation.   Cardiac Rehab from 09/13/2017 in [facility] Cardiac and Pulmonary Rehab  Date  08/02/17  Educator  MA  Instruction Review Code  1- Verbalizes Understanding      General Nutrition Guidelines/Fats and Fiber: -Group instruction provided by verbal, written material, models and posters to present the general guidelines for heart healthy nutrition. Gives an explanation and review of dietary fats and fiber.   Cardiac Rehab from 09/13/2017 in [facility] Cardiac and Pulmonary Rehab  Date  09/13/17  Educator  PI  Instruction Review Code  1- Verbalizes Understanding      Controlling Sodium/Reading Food Labels: -Group verbal and written material supporting the discussion of sodium use in heart healthy  nutrition. Review and explanation with models, verbal and written materials for utilization of the food label.   Cardiac Rehab from 09/13/2017 in [facility] Cardiac and Pulmonary Rehab  Date  07/26/17  Educator  PI  Instruction Review Code  1- Verbalizes Understanding      Exercise Physiology & General Exercise Guidelines: - Group verbal and written instruction with models to review the exercise physiology of the cardiovascular system and associated critical values. Provides general exercise guidelines with specific guidelines to those with heart or lung disease.    Cardiac Rehab from 09/13/2017 in [facility] Cardiac and Pulmonary Rehab  Date  08/09/17  Educator  [facility]  Instruction Review Code  1- Verbalizes Understanding      Aerobic Exercise & Resistance Training: - Gives group verbal and written instruction on the various components of exercise. Focuses on aerobic and resistive training programs and the benefits of this training and how to safely progress through  these programs..   Cardiac Rehab from 09/13/2017 in [facility] Cardiac and Pulmonary Rehab  Date  08/16/17  Educator  AS  Instruction Review Code  1- Verbalizes Understanding      Flexibility, Balance, Mind/Body Relaxation: Provides group verbal/written instruction on the benefits of flexibility and balance training, including mind/body exercise modes such as yoga, pilates and tai chi.  Demonstration and skill practice provided.   Cardiac Rehab from 09/13/2017 in [facility] Cardiac and Pulmonary Rehab  Date  08/18/17  Educator  AS  Instruction Review Code  1- Verbalizes Understanding      Stress and Anxiety: - Provides group verbal and written instruction about the health risks of elevated stress and causes of high stress.  Discuss the correlation between heart/lung disease and anxiety and treatment options. Review healthy ways to manage with stress and anxiety.   Cardiac Rehab from 09/13/2017 in [facility] Cardiac and Pulmonary Rehab  Date  08/25/17  Educator  [facility]  Instruction Review Code  1- Verbalizes Understanding      Depression: - Provides group verbal and written instruction on the correlation between heart/lung disease and depressed mood, treatment options, and the stigmas associated with seeking treatment.   Cardiac Rehab from 09/13/2017 in [facility] Cardiac and Pulmonary Rehab  Date  08/11/17  Educator  [facility]  Instruction Review Code  1- Verbalizes Understanding      Anatomy & Physiology of the Heart: - Group verbal and written instruction and models provide basic cardiac anatomy and physiology, with the coronary electrical and arterial systems. Review of Valvular disease and Heart Failure   Cardiac Rehab from 09/13/2017 in [facility] Cardiac and Pulmonary Rehab  Date  09/06/17  Educator  CE  Instruction Review Code  1- Verbalizes Understanding      Cardiac Procedures: - Group verbal and written instruction to review commonly prescribed medications for heart disease. Reviews the medication, class of  the drug, and side effects. Includes the steps to properly store meds and maintain the prescription regimen. (beta blockers and nitrates)   Cardiac Rehab from 09/13/2017 in [facility] Cardiac and Pulmonary Rehab  Date  07/21/17  Educator  [facility]  Instruction Review Code  5- Refused Teaching [had to leave early]      Cardiac Medications I: - Group verbal and written instruction to review commonly prescribed medications for heart disease. Reviews the medication, class of the drug, and side effects. Includes the steps to properly store meds and maintain the prescription regimen.   Cardiac Rehab from 09/13/2017 in [facility] Cardiac and Pulmonary Rehab  Date  08/30/17 [  09/01/2017 Cardiac Meds part 2 [place]  Educator  CE  Instruction Review Code  1- Verbalizes Understanding      Cardiac Medications II: -Group verbal and written instruction to review commonly prescribed medications for heart disease. Reviews the medication, class of the drug, and side effects. (all other drug classes)    Go Sex-Intimacy & Heart Disease, Get SMART - Goal Setting: - Group verbal and written instruction through game format to discuss heart disease and the return to sexual intimacy. Provides group verbal and written material to discuss and apply goal setting through the application of the S.M.A.R.T. Method.   Cardiac Rehab from 09/13/2017 in [facility] Cardiac and Pulmonary Rehab  Date  07/21/17  Educator  [facility]  Instruction Review Code  5- Refused Teaching [had to leave early]      Other Matters of the Heart: - Provides group verbal, written materials and models to describe Stable Angina and Peripheral Artery. Includes description of the disease process and treatment options available to the cardiac patient.   Exercise & Equipment Safety: - Individual verbal instruction and demonstration of equipment use and safety with use of the equipment.   Infection Prevention: - Provides verbal and written material to individual  with discussion of infection control including proper hand washing and proper equipment cleaning during exercise session.   Falls Prevention: - Provides verbal and written material to individual with discussion of falls prevention and safety.   Diabetes: - Individual verbal and written instruction to review signs/symptoms of diabetes, desired ranges of glucose level fasting, after meals and with exercise. Acknowledge that pre and post exercise glucose checks will be done for 3 sessions at entry of program.   Know Your Numbers and Risk Factors: -Group verbal and written instruction about important numbers in your health.  Discussion of what are risk factors and how they play a role in the disease process.  Review of Cholesterol, Blood Pressure, Diabetes, and BMI and the role they play in your overall health.   Sleep Hygiene: -Provides group verbal and written instruction about how sleep can affect your health.  Define sleep hygiene, discuss sleep cycles and impact of sleep habits. Review good sleep hygiene tips.    Cardiac Rehab from 09/13/2017 in [facility] Cardiac and Pulmonary Rehab  Date  09/08/17  Educator  [facility]  Instruction Review Code  1- Verbalizes Understanding      Other: -Provides group and verbal instruction on various topics (see comments)   Knowledge Questionnaire Score: Knowledge Questionnaire Score - 09/16/17 1357      Knowledge Questionnaire Score   Post Score  26       Core Components/Risk Factors/Patient Goals at Admission: Personal Goals and Risk Factors at Admission - 07/08/17 1454      Core Components/Risk Factors/Patient Goals on Admission    Weight Management  Yes;Obesity    Intervention  Weight Management: Develop a combined nutrition and exercise program designed to reach desired caloric intake, while maintaining appropriate intake of nutrient and fiber, sodium and fats, and appropriate energy expenditure required for the weight goal.;Weight Management:  Provide education and appropriate resources to help participant work on and attain dietary goals.;Weight Management/Obesity: Establish reasonable short term and long term weight goals.;Obesity: Provide education and appropriate resources to help participant work on and attain dietary goals.    Admit Weight  185 lb (83.9 kg)    Goal Weight: Short Term  180 lb (81.6 kg)    Goal Weight: Long Term  160 lb (72.6  kg)    Expected Outcomes  Short Term: Continue to assess and modify interventions until short term weight is achieved;Long Term: Adherence to nutrition and physical activity/exercise program aimed toward attainment of established weight goal;Weight Loss: Understanding of general recommendations for a balanced deficit meal plan, which promotes 1-2 lb weight loss per week and includes a negative energy balance of [phone] kcal/d;Understanding recommendations for meals to include 15-35% energy as protein, 25-35% energy from fat, 35-60% energy from carbohydrates, less than 267m of dietary cholesterol, 20-35 gm of total fiber daily;Understanding of distribution of calorie intake throughout the day with the consumption of 4-5 meals/snacks    Hypertension  Yes    Intervention  Provide education on lifestyle modifcations including regular physical activity/exercise, weight management, moderate sodium restriction and increased consumption of fresh fruit, vegetables, and low fat dairy, alcohol moderation, and smoking cessation.;Monitor prescription use compliance.    Expected Outcomes  Short Term: Continued assessment and intervention until BP is < 140/966mHG in hypertensive participants. < 130/8055mG in hypertensive participants with diabetes, heart failure or chronic kidney disease.;Long Term: Maintenance of blood pressure at goal levels.    Lipids  Yes    Intervention  Provide education and support for participant on nutrition & aerobic/resistive exercise along with prescribed medications to achieve LDL  <52m30mDL >40mg84m Expected Outcomes  Short Term: Participant states understanding of desired cholesterol values and is compliant with medications prescribed. Participant is following exercise prescription and nutrition guidelines.;Long Term: Cholesterol controlled with medications as prescribed, with individualized exercise RX and with personalized nutrition plan. Value goals: LDL < 52mg,43m > 40 mg.    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 She and her 95 yea26old husband have had a lot of health issues over the last couple years. She is thinking of going back to work     IntervNurse, mental healthidual and/or small group education and counseling on adjustment to heart disease, stress management and health-related lifestyle change. Teach and support self-help strategies.;Refer participants experiencing significant psychosocial distress to appropriate mental health specialists for further evaluation and treatment. When possible, include family members and significant others in education/counseling sessions.    Expected Outcomes  Short Term: Participant demonstrates changes in health-related behavior, relaxation and other stress management skills, ability to obtain effective social support, and compliance with psychotropic medications if prescribed.;Long Term: Emotional wellbeing is indicated by absence of clinically significant psychosocial distress or social isolation.       Core Components/Risk Factors/Patient Goals Review:  Goals and Risk Factor Review    Row Name 08/12/17 1648 09/01/17 1655           Core Components/Risk Factors/Patient Goals Review   Personal Goals Review  Hypertension;Lipids;Weight Management/Obesity  Weight Management/Obesity;Hypertension;Stress      Review  Dr said her cholesterol was high in spite of long term statin use.  She is trying a new medication.  She has appt with RD to discuss nutrition.  Margaret [name] sRaford Pitchershe will have increased stress this  week since her stepdaughter will be visiting. Cholestrol blood work will be checked. WEight is down today to 181lbs       Expected Outcomes  Short - meet with RD and follow dietary recommendations Long - Gain better control of cholesterol with diet exercise and meds  Heart healthy living         Core Components/Risk Factors/Patient Goals at Discharge (Final Review):  Goals and Risk Factor Review - 09/01/17  1655      Core Components/Risk Factors/Patient Goals Review   Personal Goals Review  Weight Management/Obesity;Hypertension;Stress    Review  Margaret [name] said she will have increased stress this week since her stepdaughter will be visiting. Cholestrol blood work will be checked. WEight is down today to 181lbs     Expected Outcomes  Heart healthy living       ITP Comments: ITP Comments    Row Name 07/08/17 1452 07/21/17 0632 08/18/17 0557 09/13/17 1646 09/15/17 0645   ITP Comments  Med Review completed. Initial ITP created. Diagnosis can be found in [facility] encounter 06/16/17  30 Day review. Continue with ITP unless directed changes per Medical Director review.   New to program  30 day review. Continue with ITP unless directed changes per Medical Director review.  Zaleah Ternes did not complete her rehab session.  Amisha was in the ER last night with what was ruled indigestion. She also got cortisone shots in her knee today and her doctor told her not to do too much today. She stayed to listen to education and left without exercising. She plans to come back to the next exercise session.   30 Day review. Continue with ITP unless directed changes per Medical Director review.     [place] Name 09/16/17 1643           ITP Comments  Discharge ITP sent and signed by Dr. Sabra Heck.  Discharge Summary routed to PCP and cardiologist.          Comments: Discharge ITP

## 2017-09-16 NOTE — Progress Notes (Signed)
Discharge Progress Report  Patient Details  Name: Margaret [name] MRN: [id number] Date of Birth: 08-17-1940 Referring Provider:     Cardiac Rehab from 07/08/2017 in [facility] Cardiac and Pulmonary Rehab  Referring Provider  Arida       Number of Visits: 34/36  Reason for Discharge:  Patient reached a stable level of exercise. Patient independent in their exercise. Patient has met program and personal goals.  Smoking History:  Social History   Tobacco Use  Smoking Status Former Smoker  . Types: Cigarettes  . Last attempt to quit: 06/22/1984  . Years since quitting: 33.2  Smokeless Tobacco Never Used    Diagnosis:  NSTEMI (non-ST elevated myocardial infarction) ([place])  ADL UCSD:   Initial Exercise Prescription: Initial Exercise Prescription - 07/08/17 1500      Date of Initial Exercise RX and Referring Provider   Date  07/08/17    Referring Provider  Arida      Treadmill   MPH  2    Grade  0    Minutes  15    METs  2.5      Recumbant Bike   Level  1    RPM  60    Watts  8    Minutes  15    METs  2.2      NuStep   Level  2    SPM  80    Minutes  15    METs  2.2      Prescription Details   Frequency (times per week)  3    Duration  Progress to 45 minutes of aerobic exercise without signs/symptoms of physical distress      Intensity   THRR 40-80% of Max Heartrate  89-125    Ratings of Perceived Exertion  11-13    Perceived Dyspnea  0-4      Resistance Training   Training Prescription  Yes    Weight  3 lb    Reps  10-15       Discharge Exercise Prescription (Final Exercise Prescription Changes): Exercise Prescription Changes - 09/08/17 1100      Response to Exercise   Blood Pressure (Admit)  126/68    Blood Pressure (Exercise)  162/84    Blood Pressure (Exit)  142/68    Heart Rate (Admit)  78 bpm    Heart Rate (Exercise)  100 bpm    Heart Rate (Exit)  61 bpm    Rating of Perceived Exertion (Exercise)  13    Symptoms  none    Duration  Continue  with 45 min of aerobic exercise without signs/symptoms of physical distress.    Intensity  THRR unchanged      Progression   Progression  Continue to progress workloads to maintain intensity without signs/symptoms of physical distress.    Average METs  2.53      Resistance Training   Training Prescription  Yes    Weight  3 lb    Reps  10-15      Interval Training   Interval Training  No      Treadmill   MPH  2    Grade  0    Minutes  15    METs  2.5      Home Exercise Plan   Plans to continue exercise at  [employer] (comment) tennis and walking at home    Frequency  Add 3 additional days to program exercise sessions.    Initial Home Exercises Provided  08/05/17       Functional Capacity: 6 Minute Walk    Row Name 07/08/17 1522 09/09/17 1711       6 Minute Walk   Phase  -  Discharge    Distance  1150 feet  1186 feet    Distance % Change  -  3.1 %    Distance Feet Change  -  36 ft    Walk Time  6 minutes  6 minutes    # of Rest Breaks  0  0    MPH  2.18  2.25    METS  2.28  2.21    RPE  15  13    Perceived Dyspnea   0  -    VO2 Peak  7.99  7.75    Symptoms  No  -    Resting HR  55 bpm  74 bpm    Resting BP  128/62  138/68    Resting Oxygen Saturation   100 %  99 %    Exercise Oxygen Saturation  during 6 min walk  100 %  99 %    Max Ex. HR  102 bpm  80 bpm    Max Ex. BP  160/64  180/62    2 Minute Post BP  128/58  -       Psychological, QOL, Others - Outcomes: PHQ 2/9: Depression screen [facility] 2/9 09/16/2017 08/11/2017 07/08/2017  Decreased Interest 0 1 2  Down, Depressed, Hopeless - 1 1  PHQ - 2 Score 0 2 3  Altered sleeping 0 0 1  Tired, decreased energy 0 1 1  Change in appetite 0 0 0  Feeling bad or failure about yourself  3 2 3   Trouble concentrating 0 0 0  Moving slowly or fidgety/restless 0 0 0  Suicidal thoughts 0 0 0  PHQ-9 Score 3 5 8   Difficult doing work/chores Not difficult at all Not difficult at all Somewhat difficult    Quality  of Life: Quality of Life - 09/16/17 1356      Quality of Life Scores   Health/Function Post  18.36 %    Socioeconomic Post  22.07 %    Psych/Spiritual Post  22.21 %    Family Post  17 %    GLOBAL Post  19.71 %       Personal Goals: Goals established at orientation with interventions provided to work toward goal. Personal Goals and Risk Factors at Admission - 07/08/17 1454      Core Components/Risk Factors/Patient Goals on Admission    Weight Management  Yes;Obesity    Intervention  Weight Management: Develop a combined nutrition and exercise program designed to reach desired caloric intake, while maintaining appropriate intake of nutrient and fiber, sodium and fats, and appropriate energy expenditure required for the weight goal.;Weight Management: Provide education and appropriate resources to help participant work on and attain dietary goals.;Weight Management/Obesity: Establish reasonable short term and long term weight goals.;Obesity: Provide education and appropriate resources to help participant work on and attain dietary goals.    Admit Weight  185 lb (83.9 kg)    Goal Weight: Short Term  180 lb (81.6 kg)    Goal Weight: Long Term  160 lb (72.6 kg)    Expected Outcomes  Short Term: Continue to assess and modify interventions until short term weight is achieved;Long Term: Adherence to nutrition and physical activity/exercise program aimed toward attainment of established weight goal;Weight Loss: Understanding of general recommendations for a  balanced deficit meal plan, which promotes 1-2 lb weight loss per week and includes a negative energy balance of [phone] kcal/d;Understanding recommendations for meals to include 15-35% energy as protein, 25-35% energy from fat, 35-60% energy from carbohydrates, less than 263m of dietary cholesterol, 20-35 gm of total fiber daily;Understanding of distribution of calorie intake throughout the day with the consumption of 4-5 meals/snacks     Hypertension  Yes    Intervention  Provide education on lifestyle modifcations including regular physical activity/exercise, weight management, moderate sodium restriction and increased consumption of fresh fruit, vegetables, and low fat dairy, alcohol moderation, and smoking cessation.;Monitor prescription use compliance.    Expected Outcomes  Short Term: Continued assessment and intervention until BP is < 140/92mHG in hypertensive participants. < 130/8060mG in hypertensive participants with diabetes, heart failure or chronic kidney disease.;Long Term: Maintenance of blood pressure at goal levels.    Lipids  Yes    Intervention  Provide education and support for participant on nutrition & aerobic/resistive exercise along with prescribed medications to achieve LDL <70m52mDL >40mg61m Expected Outcomes  Short Term: Participant states understanding of desired cholesterol values and is compliant with medications prescribed. Participant is following exercise prescription and nutrition guidelines.;Long Term: Cholesterol controlled with medications as prescribed, with individualized exercise RX and with personalized nutrition plan. Value goals: LDL < 70mg,11m > 40 mg.    Stress  Yes BarbarRonellas she is experiencing financial stress. She and her 95 yea10old husband have had a lot of health issues over the last couple years. She is thinking of going back to work     IntervNurse, mental healthidual and/or small group education and counseling on adjustment to heart disease, stress management and health-related lifestyle change. Teach and support self-help strategies.;Refer participants experiencing significant psychosocial distress to appropriate mental health specialists for further evaluation and treatment. When possible, include family members and significant others in education/counseling sessions.    Expected Outcomes  Short Term: Participant demonstrates changes in health-related behavior, relaxation and  other stress management skills, ability to obtain effective social support, and compliance with psychotropic medications if prescribed.;Long Term: Emotional wellbeing is indicated by absence of clinically significant psychosocial distress or social isolation.        Personal Goals Discharge: Goals and Risk Factor Review    Row Name 08/12/17 1648 09/01/17 1655           Core Components/Risk Factors/Patient Goals Review   Personal Goals Review  Hypertension;Lipids;Weight Management/Obesity  Weight Management/Obesity;Hypertension;Stress      Review  Dr said her cholesterol was high in spite of long term statin use.  She is trying a new medication.  She has appt with RD to discuss nutrition.  Barb sRaford Pitchershe will have increased stress this week since her stepdaughter will be visiting. Cholestrol blood work will be checked. WEight is down today to 181lbs       Expected Outcomes  Short - meet with RD and follow dietary recommendations Long - Gain better control of cholesterol with diet exercise and meds  Heart healthy living         Exercise Goals and Review: Exercise Goals    Row Name 07/08/17 1522             Exercise Goals   Increase Physical Activity  Yes       Intervention  Provide advice, education, support and counseling about physical activity/exercise needs.;Develop an individualized exercise prescription for aerobic and resistive training  based on initial evaluation findings, risk stratification, comorbidities and participant's personal goals.       Expected Outcomes  Short Term: Attend rehab on a regular basis to increase amount of physical activity.;Long Term: Add in home exercise to make exercise part of routine and to increase amount of physical activity.;Long Term: Exercising regularly at least 3-5 days a week.       Increase Strength and Stamina  Yes       Intervention  Provide advice, education, support and counseling about physical activity/exercise needs.;Develop an  individualized exercise prescription for aerobic and resistive training based on initial evaluation findings, risk stratification, comorbidities and participant's personal goals.       Expected Outcomes  Short Term: Increase workloads from initial exercise prescription for resistance, speed, and METs.;Short Term: Perform resistance training exercises routinely during rehab and add in resistance training at home;Long Term: Improve cardiorespiratory fitness, muscular endurance and strength as measured by increased METs and functional capacity (6MWT)       Able to understand and use rate of perceived exertion (RPE) scale  Yes       Intervention  Provide education and explanation on how to use RPE scale       Expected Outcomes  Short Term: Able to use RPE daily in rehab to express subjective intensity level;Long Term:  Able to use RPE to guide intensity level when exercising independently       Able to understand and use Dyspnea scale  Yes       Intervention  Provide education and explanation on how to use Dyspnea scale       Expected Outcomes  Short Term: Able to use Dyspnea scale daily in rehab to express subjective sense of shortness of breath during exertion;Long Term: Able to use Dyspnea scale to guide intensity level when exercising independently       Knowledge and understanding of Target Heart Rate Range (THRR)  Yes       Intervention  Provide education and explanation of THRR including how the numbers were predicted and where they are located for reference       Expected Outcomes  Short Term: Able to state/look up THRR;Long Term: Able to use THRR to govern intensity when exercising independently;Short Term: Able to use daily as guideline for intensity in rehab       Able to check pulse independently  Yes       Intervention  Provide education and demonstration on how to check pulse in carotid and radial arteries.;Review the importance of being able to check your own pulse for safety during  independent exercise       Expected Outcomes  Short Term: Able to explain why pulse checking is important during independent exercise;Long Term: Able to check pulse independently and accurately       Understanding of Exercise Prescription  Yes       Intervention  Provide education, explanation, and written materials on patient's individual exercise prescription       Expected Outcomes  Short Term: Able to explain program exercise prescription;Long Term: Able to explain home exercise prescription to exercise independently          Nutrition & Weight - Outcomes: Pre Biometrics - 07/08/17 1521      Pre Biometrics   Height  5' 6"  (1.676 m)    Weight  185 lb 9.6 oz (84.2 kg)    Waist Circumference  37 inches    Hip Circumference  47 inches  Waist to Hip Ratio  0.79 %    BMI (Calculated)  29.97    Single Leg Stand  4.93 seconds      Post Biometrics - 09/09/17 1713       Post  Biometrics   Height  5' 6"  (1.676 m)    Weight  183 lb 12.8 oz (83.4 kg)    Waist Circumference  34.5 inches    Hip Circumference  46 inches    Waist to Hip Ratio  0.75 %    BMI (Calculated)  29.68    Single Leg Stand  3.12 seconds       Nutrition:   Nutrition Discharge: Nutrition Assessments - 07/08/17 1457      MEDFICTS Scores   Pre Score  16       Education Questionnaire Score: Knowledge Questionnaire Score - 09/16/17 1357      Knowledge Questionnaire Score   Post Score  26       Goals reviewed with patient; copy given to patient.

## 2017-09-16 NOTE — Progress Notes (Signed)
Daily Session Note  Patient Details  Name: Margaret [name] MRN: [id number] Date of Birth: 1940-07-04 Referring Provider:     Cardiac Rehab from 07/08/2017 in [facility] Cardiac and Pulmonary Rehab  Referring Provider  Arida      Encounter Date: 09/16/2017  Check In: Session Check In - 09/16/17 1641      Check-In   Location  [facility]-Cardiac & Pulmonary Rehab    Staff Present  Margaret [name], BS, RRT, Respiratory Therapist;Margaret [name] Margaret Dad, RN BSN    Supervising physician immediately available to respond to emergencies  See telemetry face sheet for immediately available ER MD    Medication changes reported      No    Fall or balance concerns reported     No    Tobacco Cessation  No Change    Warm-up and Cool-down  Performed on first and last piece of equipment    Resistance Training Performed  Yes    VAD Patient?  No      Pain Assessment   Currently in Pain?  No/denies          Social History   Tobacco Use  Smoking Status Former Smoker  . Types: Cigarettes  . Last attempt to quit: 06/22/1984  . Years since quitting: 33.2  Smokeless Tobacco Never Used    Goals Met:  Proper associated with RPD/PD & O2 Sat Independence with exercise equipment Exercise tolerated well No report of cardiac concerns or symptoms Strength training completed today  Goals Unmet:  Not Applicable  Comments:  Margaret [name] graduated today from  rehab with 34 sessions completed.  Details of the patient's exercise prescription and what She needs to do in order to continue the prescription and progress were discussed with patient.  Patient was given a copy of prescription and goals.  Patient verbalized understanding.  Margaret [name] plans to continue to exercise by joining the [employer].   Dr. Emily [name] is Medical Director for [name].

## 2017-09-16 NOTE — Patient Instructions (Signed)
Discharge Patient Instructions  Patient Details  Name: Margaret [name] MRN: [id number] Date of Birth: 04-25-1941 Referring Provider:  Wellington Hampshire, MD   Number of Visits: 81  Reason for Discharge:  Patient reached a stable level of exercise. Patient independent in their exercise. Patient has met program and personal goals.  Smoking History:  Social History   Tobacco Use  Smoking Status Former Smoker  . Types: Cigarettes  . Last attempt to quit: 06/22/1984  . Years since quitting: 33.2  Smokeless Tobacco Never Used    Diagnosis:  NSTEMI (non-ST elevated myocardial infarction) ([place])  Status post coronary artery stent placement  Initial Exercise Prescription: Initial Exercise Prescription - 07/08/17 1500      Date of Initial Exercise RX and Referring Provider   Date  07/08/17    Referring Provider  Arida      Treadmill   MPH  2    Grade  0    Minutes  15    METs  2.5      Recumbant Bike   Level  1    RPM  60    Watts  8    Minutes  15    METs  2.2      NuStep   Level  2    SPM  80    Minutes  15    METs  2.2      Prescription Details   Frequency (times per week)  3    Duration  Progress to 45 minutes of aerobic exercise without signs/symptoms of physical distress      Intensity   THRR 40-80% of Max Heartrate  89-125    Ratings of Perceived Exertion  11-13    Perceived Dyspnea  0-4      Resistance Training   Training Prescription  Yes    Weight  3 lb    Reps  10-15       Discharge Exercise Prescription (Final Exercise Prescription Changes): Exercise Prescription Changes - 09/08/17 1100      Response to Exercise   Blood Pressure (Admit)  126/68    Blood Pressure (Exercise)  162/84    Blood Pressure (Exit)  142/68    Heart Rate (Admit)  78 bpm    Heart Rate (Exercise)  100 bpm    Heart Rate (Exit)  61 bpm    Rating of Perceived Exertion (Exercise)  13    Symptoms  none    Duration  Continue with 45 min of aerobic exercise without  signs/symptoms of physical distress.    Intensity  THRR unchanged      Progression   Progression  Continue to progress workloads to maintain intensity without signs/symptoms of physical distress.    Average METs  2.53      Resistance Training   Training Prescription  Yes    Weight  3 lb    Reps  10-15      Interval Training   Interval Training  No      Treadmill   MPH  2    Grade  0    Minutes  15    METs  2.5      Home Exercise Plan   Plans to continue exercise at  [employer] (comment) tennis and walking at home    Frequency  Add 3 additional days to program exercise sessions.    Initial Home Exercises Provided  08/05/17       Functional Capacity: 6 Minute Walk  Rozel Name 07/08/17 1522 09/09/17 1711       6 Minute Walk   Phase  -  Discharge    Distance  1150 feet  1186 feet    Distance % Change  -  3.1 %    Distance Feet Change  -  36 ft    Walk Time  6 minutes  6 minutes    # of Rest Breaks  0  0    MPH  2.18  2.25    METS  2.28  2.21    RPE  15  13    Perceived Dyspnea   0  -    VO2 Peak  7.99  7.75    Symptoms  No  -    Resting HR  55 bpm  74 bpm    Resting BP  128/62  138/68    Resting Oxygen Saturation   100 %  99 %    Exercise Oxygen Saturation  during 6 min walk  100 %  99 %    Max Ex. HR  102 bpm  80 bpm    Max Ex. BP  160/64  180/62    2 Minute Post BP  128/58  -       Quality of Life: Quality of Life - 07/08/17 1459      Quality of Life Scores   Health/Function Pre  20.4 %    Socioeconomic Pre  20.25 %    Psych/Spiritual Pre  20 %    Family Pre  20.8 %    GLOBAL Pre  20.34 %       Personal Goals: Goals established at orientation with interventions provided to work toward goal. Personal Goals and Risk Factors at Admission - 07/08/17 1454      Core Components/Risk Factors/Patient Goals on Admission    Weight Management  Yes;Obesity    Intervention  Weight Management: Develop a combined nutrition and exercise program designed  to reach desired caloric intake, while maintaining appropriate intake of nutrient and fiber, sodium and fats, and appropriate energy expenditure required for the weight goal.;Weight Management: Provide education and appropriate resources to help participant work on and attain dietary goals.;Weight Management/Obesity: Establish reasonable short term and long term weight goals.;Obesity: Provide education and appropriate resources to help participant work on and attain dietary goals.    Admit Weight  185 lb (83.9 kg)    Goal Weight: Short Term  180 lb (81.6 kg)    Goal Weight: Long Term  160 lb (72.6 kg)    Expected Outcomes  Short Term: Continue to assess and modify interventions until short term weight is achieved;Long Term: Adherence to nutrition and physical activity/exercise program aimed toward attainment of established weight goal;Weight Loss: Understanding of general recommendations for a balanced deficit meal plan, which promotes 1-2 lb weight loss per week and includes a negative energy balance of [phone] kcal/d;Understanding recommendations for meals to include 15-35% energy as protein, 25-35% energy from fat, 35-60% energy from carbohydrates, less than 253m of dietary cholesterol, 20-35 gm of total fiber daily;Understanding of distribution of calorie intake throughout the day with the consumption of 4-5 meals/snacks    Hypertension  Yes    Intervention  Provide education on lifestyle modifcations including regular physical activity/exercise, weight management, moderate sodium restriction and increased consumption of fresh fruit, vegetables, and low fat dairy, alcohol moderation, and smoking cessation.;Monitor prescription use compliance.    Expected Outcomes  Short Term: Continued assessment and intervention until BP is <  140/52m HG in hypertensive participants. < 130/872mHG in hypertensive participants with diabetes, heart failure or chronic kidney disease.;Long Term: Maintenance of blood  pressure at goal levels.    Lipids  Yes    Intervention  Provide education and support for participant on nutrition & aerobic/resistive exercise along with prescribed medications to achieve LDL <7079mHDL >51m33m  Expected Outcomes  Short Term: Participant states understanding of desired cholesterol values and is compliant with medications prescribed. Participant is following exercise prescription and nutrition guidelines.;Long Term: Cholesterol controlled with medications as prescribed, with individualized exercise RX and with personalized nutrition plan. Value goals: LDL < 70mg44mL > 40 mg.    Stress  Yes BarbaDaquishaes she is experiencing financial stress. She and her 95 ye57 old husband have had a lot of health issues over the last couple years. She is thinking of going back to work     InterNurse, mental healthvidual and/or small group education and counseling on adjustment to heart disease, stress management and health-related lifestyle change. Teach and support self-help strategies.;Refer participants experiencing significant psychosocial distress to appropriate mental health specialists for further evaluation and treatment. When possible, include family members and significant others in education/counseling sessions.    Expected Outcomes  Short Term: Participant demonstrates changes in health-related behavior, relaxation and other stress management skills, ability to obtain effective social support, and compliance with psychotropic medications if prescribed.;Long Term: Emotional wellbeing is indicated by absence of clinically significant psychosocial distress or social isolation.        Personal Goals Discharge: Goals and Risk Factor Review - 09/01/17 1655      Core Components/Risk Factors/Patient Goals Review   Personal Goals Review  Weight Management/Obesity;Hypertension;Stress    Review  Barb Raford Pitcher she will have increased stress this week since her stepdaughter will be visiting. Cholestrol  blood work will be checked. WEight is down today to 181lbs     Expected Outcomes  Heart healthy living       Exercise Goals and Review: Exercise Goals    Row Name 07/08/17 1522             Exercise Goals   Increase Physical Activity  Yes       Intervention  Provide advice, education, support and counseling about physical activity/exercise needs.;Develop an individualized exercise prescription for aerobic and resistive training based on initial evaluation findings, risk stratification, comorbidities and participant's personal goals.       Expected Outcomes  Short Term: Attend rehab on a regular basis to increase amount of physical activity.;Long Term: Add in home exercise to make exercise part of routine and to increase amount of physical activity.;Long Term: Exercising regularly at least 3-5 days a week.       Increase Strength and Stamina  Yes       Intervention  Provide advice, education, support and counseling about physical activity/exercise needs.;Develop an individualized exercise prescription for aerobic and resistive training based on initial evaluation findings, risk stratification, comorbidities and participant's personal goals.       Expected Outcomes  Short Term: Increase workloads from initial exercise prescription for resistance, speed, and METs.;Short Term: Perform resistance training exercises routinely during rehab and add in resistance training at home;Long Term: Improve cardiorespiratory fitness, muscular endurance and strength as measured by increased METs and functional capacity (6MWT)       Able to understand and use rate of perceived exertion (RPE) scale  Yes       Intervention  Provide education  and explanation on how to use RPE scale       Expected Outcomes  Short Term: Able to use RPE daily in rehab to express subjective intensity level;Long Term:  Able to use RPE to guide intensity level when exercising independently       Able to understand and use Dyspnea scale   Yes       Intervention  Provide education and explanation on how to use Dyspnea scale       Expected Outcomes  Short Term: Able to use Dyspnea scale daily in rehab to express subjective sense of shortness of breath during exertion;Long Term: Able to use Dyspnea scale to guide intensity level when exercising independently       Knowledge and understanding of Target Heart Rate Range (THRR)  Yes       Intervention  Provide education and explanation of THRR including how the numbers were predicted and where they are located for reference       Expected Outcomes  Short Term: Able to state/look up THRR;Long Term: Able to use THRR to govern intensity when exercising independently;Short Term: Able to use daily as guideline for intensity in rehab       Able to check pulse independently  Yes       Intervention  Provide education and demonstration on how to check pulse in carotid and radial arteries.;Review the importance of being able to check your own pulse for safety during independent exercise       Expected Outcomes  Short Term: Able to explain why pulse checking is important during independent exercise;Long Term: Able to check pulse independently and accurately       Understanding of Exercise Prescription  Yes       Intervention  Provide education, explanation, and written materials on patient's individual exercise prescription       Expected Outcomes  Short Term: Able to explain program exercise prescription;Long Term: Able to explain home exercise prescription to exercise independently          Nutrition & Weight - Outcomes: Pre Biometrics - 07/08/17 1521      Pre Biometrics   Height  _0  (1.676 m)    Weight  185 lb 9.6 oz (84.2 kg)    Waist Circumference  37 inches    Hip Circumference  47 inches    Waist to Hip Ratio  0.79 %    BMI (Calculated)  29.97    Single Leg Stand  4.93 seconds      Post Biometrics - 09/09/17 1713       Post  Biometrics   Height  _1  (1.676 m)    Weight  183  lb 12.8 oz (83.4 kg)    Waist Circumference  34.5 inches    Hip Circumference  46 inches    Waist to Hip Ratio  0.75 %    BMI (Calculated)  29.68    Single Leg Stand  3.12 seconds       Nutrition:   Nutrition Discharge: Nutrition Assessments - 07/08/17 1457      MEDFICTS Scores   Pre Score  16       Education Questionnaire Score: Knowledge Questionnaire Score - 07/08/17 1215      Knowledge Questionnaire Score   Pre Score  21/28 correct answers reviewed with Pamala Hurry       Goals reviewed with patient; copy given to patient.

## 2017-09-17 NOTE — Telephone Encounter (Signed)
Spoke to patient she is feeling better, no chest pain.She will keep appointment as planned with Dr.Jordan 10/18/17.She will call sooner if needed.

## 2017-09-24 DIAGNOSIS — D485 Neoplasm of uncertain behavior of skin: Secondary | ICD-10-CM | POA: Diagnosis not present

## 2017-09-24 DIAGNOSIS — R441 Visual hallucinations: Secondary | ICD-10-CM | POA: Diagnosis not present

## 2017-09-24 DIAGNOSIS — Z85828 Personal history of other malignant neoplasm of skin: Secondary | ICD-10-CM | POA: Diagnosis not present

## 2017-09-24 DIAGNOSIS — D225 Melanocytic nevi of trunk: Secondary | ICD-10-CM | POA: Diagnosis not present

## 2017-09-24 DIAGNOSIS — D2271 Melanocytic nevi of right lower limb, including hip: Secondary | ICD-10-CM | POA: Diagnosis not present

## 2017-09-24 DIAGNOSIS — G44221 Chronic tension-type headache, intractable: Secondary | ICD-10-CM | POA: Diagnosis not present

## 2017-09-24 DIAGNOSIS — Z8673 Personal history of transient ischemic attack (TIA), and cerebral infarction without residual deficits: Secondary | ICD-10-CM | POA: Diagnosis not present

## 2017-09-24 DIAGNOSIS — Z08 Encounter for follow-up examination after completed treatment for malignant neoplasm: Secondary | ICD-10-CM | POA: Diagnosis not present

## 2017-09-24 DIAGNOSIS — I214 Non-ST elevation (NSTEMI) myocardial infarction: Secondary | ICD-10-CM | POA: Diagnosis not present

## 2017-09-24 DIAGNOSIS — D2262 Melanocytic nevi of left upper limb, including shoulder: Secondary | ICD-10-CM | POA: Diagnosis not present

## 2017-09-24 DIAGNOSIS — Z8659 Personal history of other mental and behavioral disorders: Secondary | ICD-10-CM | POA: Diagnosis not present

## 2017-09-24 DIAGNOSIS — D2261 Melanocytic nevi of right upper limb, including shoulder: Secondary | ICD-10-CM | POA: Diagnosis not present

## 2017-09-24 DIAGNOSIS — D2272 Melanocytic nevi of left lower limb, including hip: Secondary | ICD-10-CM | POA: Diagnosis not present

## 2017-09-24 DIAGNOSIS — L821 Other seborrheic keratosis: Secondary | ICD-10-CM | POA: Diagnosis not present

## 2017-10-13 DIAGNOSIS — M25562 Pain in left knee: Secondary | ICD-10-CM | POA: Diagnosis not present
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
## 2017-10-18 ENCOUNTER — Ambulatory Visit: Payer: PPO | Admitting: Cardiology

## 2017-10-18 ENCOUNTER — Encounter: Payer: Self-pay | Admitting: Cardiology

## 2017-10-18 VITALS — BP 177/69 | HR 69 | Ht 66.0 in | Wt 186.8 lb

## 2017-10-18 DIAGNOSIS — E7800 Pure hypercholesterolemia, unspecified: Secondary | ICD-10-CM

## 2017-10-18 DIAGNOSIS — Z9861 Coronary angioplasty status: Secondary | ICD-10-CM

## 2017-10-18 DIAGNOSIS — I2510 Atherosclerotic heart disease of native coronary artery without angina pectoris: Secondary | ICD-10-CM | POA: Diagnosis not present

## 2017-10-18 DIAGNOSIS — I10 Essential (primary) hypertension: Secondary | ICD-10-CM | POA: Diagnosis not present

## 2017-10-18 NOTE — Patient Instructions (Signed)
We will check blood work today  I will see you in 6 months

## 2017-10-19 LAB — BASIC METABOLIC PANEL
BUN / CREAT RATIO: 25 (ref 12–28)
BUN: 18 mg/dL (ref 8–27)
CO2: 22 mmol/L (ref 20–29)
Calcium: 9.2 mg/dL (ref 8.7–10.3)
Chloride: 106 mmol/L (ref 96–106)
Creatinine, Ser: 0.73 mg/dL (ref 0.57–1.00)
GFR, EST AFRICAN AMERICAN: 91 mL/min/{1.73_m2} (ref >59)
GFR, EST NON AFRICAN AMERICAN: 79 mL/min/{1.73_m2} (ref >59)
Glucose: 215 mg/dL — ABNORMAL HIGH (ref 65–99)
POTASSIUM: 4.1 mmol/L (ref 3.5–5.2)
Sodium: 144 mmol/L (ref 134–144)

## 2017-10-19 LAB — LIPID PANEL W/O CHOL/HDL RATIO
Cholesterol, Total: 223 mg/dL — ABNORMAL HIGH (ref 100–199)
HDL: 68 mg/dL (ref >39)
LDL Calculated: 129 mg/dL — ABNORMAL HIGH (ref 0–99)
Triglycerides: 129 mg/dL (ref 0–149)
VLDL Cholesterol Cal: 26 mg/dL (ref 5–40)

## 2017-10-19 LAB — HEPATIC FUNCTION PANEL
ALBUMIN: 4.3 g/dL (ref 3.5–4.8)
ALT: 9 IU/L (ref 0–32)
AST: 18 IU/L (ref 0–40)
Alkaline Phosphatase: 151 IU/L — ABNORMAL HIGH (ref 39–117)
Bilirubin Total: 0.6 mg/dL (ref 0.0–1.2)
Bilirubin, Direct: 0.16 mg/dL (ref 0.00–0.40)
TOTAL PROTEIN: 6.6 g/dL (ref 6.0–8.5)

## 2017-11-04 DIAGNOSIS — Z0000 Encounter for general adult medical examination without abnormal findings: Secondary | ICD-10-CM | POA: Diagnosis not present

## 2017-11-04 DIAGNOSIS — I10 Essential (primary) hypertension: Secondary | ICD-10-CM | POA: Diagnosis not present

## 2017-11-04 DIAGNOSIS — E7849 Other hyperlipidemia: Secondary | ICD-10-CM | POA: Diagnosis not present

## 2017-11-04 DIAGNOSIS — Z8673 Personal history of transient ischemic attack (TIA), and cerebral infarction without residual deficits: Secondary | ICD-10-CM | POA: Diagnosis not present

## 2017-11-04 DIAGNOSIS — M25561 Pain in right knee: Secondary | ICD-10-CM | POA: Diagnosis not present

## 2017-11-04 DIAGNOSIS — M25461 Effusion, right knee: Secondary | ICD-10-CM | POA: Diagnosis not present

## 2017-11-04 DIAGNOSIS — R739 Hyperglycemia, unspecified: Secondary | ICD-10-CM | POA: Diagnosis not present

## 2017-11-04 DIAGNOSIS — Z9884 Bariatric surgery status: Secondary | ICD-10-CM | POA: Insufficient documentation

## 2017-11-04 DIAGNOSIS — I2510 Atherosclerotic heart disease of native coronary artery without angina pectoris: Secondary | ICD-10-CM | POA: Diagnosis not present

## 2017-11-04 NOTE — Progress Notes (Deleted)
Patient ID: Margaret [name]                 DOB: February 28, 1941                    MRN: [id number]      HPI: Margaret [name] is a 77 y.o. female patient referred to lipid clinic by Dr Martinique. PMH is significant for CAD s/p stent placement , GERD, hypertension, hyperlipidemia, and morbid obesity s/p gastric bypass.  Patient currently on   Current Medications:  Atorvastatin 80mg  daily Ezetimibe 10mg  daily  Intolerances: none  Risk Factors:   LDL goal: < 70mg /dL  Diet:   Exercise:   Family History: significant family history with MI in father if his 51s, MI with CABG in sister, MI with CABG in brother, diabetes in mother and sister, hypertension in sister, and daughter.  Social History: former smoker, never used smokeless tobacco and denies alcohol use  Labs:  Past Medical History:  Diagnosis Date  . Anxiety and depression   . CAD (coronary artery disease)   . Cellulitis of right leg 2005  . Colon polyp   . Dyspnea on exertion   . GERD (gastroesophageal reflux disease)   . Hyperlipidemia   . Hypertension   . Morbid obesity ([place])   . Presence of stent in right coronary artery 02/08/12   2.5x18 Xience distal RCA  . Right shoulder pain   . Stroke ([place])   . Vaginal prolapse     Current Outpatient Medications on File Prior to Visit  Medication Sig Dispense Refill  . amLODipine (NORVASC) 5 MG tablet Take 1 tablet (5 mg total) by mouth daily. 90 tablet 3  . aspirin EC 81 MG tablet Take 1 tablet (81 mg total) by mouth daily. 90 tablet 3  . atorvastatin (LIPITOR) 80 MG tablet Take 80 mg by mouth daily.    . carvedilol (COREG) 6.25 MG tablet Take 1 tablet (6.25 mg total) by mouth 2 (two) times daily with a meal. 60 tablet 1  . Docusate Calcium (STOOL SOFTENER PO) Take by mouth. As needed    . ezetimibe (ZETIA) 10 MG tablet Take 1 tablet (10 mg total) by mouth daily. 30 tablet 5  . losartan (COZAAR) 50 MG tablet Take 1 tablet (50 mg total) by mouth daily. 30 tablet 1  . nitroGLYCERIN  (NITROSTAT) 0.4 MG SL tablet Place 1 tablet (0.4 mg total) under the tongue every 5 (five) minutes as needed for chest pain. 30 tablet 2  . pantoprazole (PROTONIX) 40 MG tablet Take 40 mg by mouth daily.    . sertraline (ZOLOFT) 25 MG tablet Take 25 mg by mouth daily.    . ticagrelor (BRILINTA) 90 MG TABS tablet Take 1 tablet (90 mg total) by mouth 2 (two) times daily. 60 tablet 5   No current facility-administered medications on file prior to visit.     Allergies  Allergen Reactions  . Erythromycin Nausea Only  . Other Rash    Micropore tape  . Tape Itching and Rash    Also "band-aids"    No problem-specific Assessment & Plan notes found for this encounter.  Shalawn Wynder Rodriguez-Guzman PharmD, BCPS, CPP [place] [id number] 11/04/2017 7:15 AM

## 2017-11-24 DIAGNOSIS — M1711 Unilateral primary osteoarthritis, right knee: Secondary | ICD-10-CM | POA: Diagnosis not present

## 2017-11-24 DIAGNOSIS — M1712 Unilateral primary osteoarthritis, left knee: Secondary | ICD-10-CM | POA: Diagnosis not present

## 2017-11-25 ENCOUNTER — Ambulatory Visit (INDEPENDENT_AMBULATORY_CARE_PROVIDER_SITE_OTHER): Payer: PPO | Admitting: Pharmacist Clinician (PhC)/ Clinical Pharmacy Specialist

## 2017-11-25 DIAGNOSIS — E7800 Pure hypercholesterolemia, unspecified: Secondary | ICD-10-CM

## 2017-11-25 MED ORDER — ROSUVASTATIN CALCIUM 40 MG PO TABS: 40.0000 mg | ORAL_TABLET | Freq: Every day | ORAL | 3 refills | Status: DC

## 2017-11-25 NOTE — Progress Notes (Signed)
11/26/2017 Margaret [name] 08-07-1940 [id number]   HPI:  Margaret [name] is a 78 y.o. female patient of Dr Martinique, who presents today for a lipid clinic evaluation.  In addition to elevated lipids, her medical history is significant for CAD with multiple stenosis and an episode of in-stent restenosis in the RCA, hypertension, CVA, morbid obesity (post gastric bypass) and GERD.  She is currently feeling well, plays tennis 1-2 times per week and is active overall.  She does admit, on questioning, that she has not been taking her atorvastatin every day, as the tablets are rather large and hard to swallow.    Current Medications:  Atorvastatin 80 mg qd (3-4 times per week)  Ezetimibe 10 mg qd  Cholesterol Goals:   LDL < 70  Intolerant/previously tried:  Family history:   Father had first MI at 57, died at age 1  Brother had MI in his 46's, eventually died from depression in his 33's  Mother died around age 26 from colon cancer  Sister deceased from multiple health issues after surgery  Daughter with hypertension, hyperlipidemia, DM  Diet:    Mostly home cooked ; rarely add salt; fried foods make her sick (post bypass).  Has lost about 50 pounds since the bypass 5 years ago  Exercise:    Tennis 1-2 days per week, plays more during competitive season  Labs:   10/18/17:  TC 223, TG 129, HDL 68, LDL 129  Current Outpatient Medications  Medication Sig Dispense Refill  . amLODipine (NORVASC) 5 MG tablet Take 1 tablet (5 mg total) by mouth daily. 90 tablet 3  . aspirin EC 81 MG tablet Take 1 tablet (81 mg total) by mouth daily. 90 tablet 3  . atorvastatin (LIPITOR) 80 MG tablet Take 80 mg by mouth daily.    . carvedilol (COREG) 6.25 MG tablet Take 1 tablet (6.25 mg total) by mouth 2 (two) times daily with a meal. 60 tablet 1  . Docusate Calcium (STOOL SOFTENER PO) Take by mouth. As needed    . ezetimibe (ZETIA) 10 MG tablet Take 1 tablet (10 mg total) by mouth daily. 30 tablet 5  . losartan  (COZAAR) 50 MG tablet Take 1 tablet (50 mg total) by mouth daily. 30 tablet 1  . nitroGLYCERIN (NITROSTAT) 0.4 MG SL tablet Place 1 tablet (0.4 mg total) under the tongue every 5 (five) minutes as needed for chest pain. 30 tablet 2  . pantoprazole (PROTONIX) 40 MG tablet Take 40 mg by mouth daily.    . rosuvastatin (CRESTOR) 40 MG tablet Take 1 tablet (40 mg total) by mouth daily. 30 tablet 3  . sertraline (ZOLOFT) 25 MG tablet Take 25 mg by mouth daily.    . ticagrelor (BRILINTA) 90 MG TABS tablet Take 1 tablet (90 mg total) by mouth 2 (two) times daily. 60 tablet 5   No current facility-administered medications for this visit.     Allergies  Allergen Reactions  . Erythromycin Nausea Only  . Other Rash    Micropore tape  . Tape Itching and Rash    Also "band-aids"    Past Medical History:  Diagnosis Date  . Anxiety and depression   . CAD (coronary artery disease)   . Cellulitis of right leg 2005  . Colon polyp   . Dyspnea on exertion   . GERD (gastroesophageal reflux disease)   . Hyperlipidemia   . Hypertension   . Morbid obesity ([place])   . Presence of stent in right coronary artery  02/08/12   2.5x18 Xience distal RCA  . Right shoulder pain   . Stroke ([place])   . Vaginal prolapse     There were no vitals taken for this visit.   Hyperlipidemia Patient with hyperlipidemia and CAD currently not to lipid goals.  Patient has been only partially compliant with her atorvastatin due to pill size.  Will switch her to rosuvastatin 40 mg once daily and she should take this for 6-8 weeks on a daily basis.  At that time we will repeat lipid labs to determine if she is to goal or not.   She was encouraged to continue with her regular exercise routines and continue watching her diet closely.   Once we get repeat labs drawn (by the first of August), we can determine if she will need a PCSK-9 inhibitor.     Tommy Medal PharmD CPP [facility] [address] [place], [place] [address]

## 2017-11-25 NOTE — Patient Instructions (Addendum)
Stop taking atorvastatin.  You can finish off what you have at home, then switch to rosuvastatin 40 mg once daily.     Will repeat cholesterol labs (fasting if you can) in about 6-8 weeks.  Around week of July 22.  Once we get those lab results back we can determine if we need to add the Repatha to your regimen.    If you have any questions or problems with the medications please give [name] a call.  Kristin/Raquel [phone]    Cholesterol Cholesterol is a fat. Your body needs a small amount of cholesterol. Cholesterol (plaque) may build up in your blood vessels (arteries). That makes you more likely to have a heart attack or stroke. You cannot feel your cholesterol level. Having a blood test is the only way to find out if your level is high. Keep your test results. Work with your doctor to keep your cholesterol at a good level. What do the results mean?  Total cholesterol is how much cholesterol is in your blood.  LDL is bad cholesterol. This is the type that can build up. Try to have low LDL.  HDL is good cholesterol. It cleans your blood vessels and carries LDL away. Try to have high HDL.  Triglycerides are fat that the body can store or burn for energy. What are good levels of cholesterol?  Total cholesterol below 200.  LDL below 100 is good for people who have health risks. LDL below 70 is good for people who have very high risks.  HDL above 40 is good. It is best to have HDL of 60 or higher.  Triglycerides below 150. How can I lower my cholesterol? Diet Follow your diet program as told by your doctor.  Choose fish, white meat chicken, or [place] that is roasted or baked. Try not to eat red meat, fried foods, sausage, or lunch meats.  Eat lots of fresh fruits and vegetables.  Choose whole grains, beans, pasta, potatoes, and cereals.  Choose olive oil, corn oil, or canola oil. Only use small amounts.  Try not to eat butter, mayonnaise, shortening, or palm kernel  oils.  Try not to eat foods with trans fats.  Choose low-fat or nonfat dairy foods. ? Drink skim or nonfat milk. ? Eat low-fat or nonfat yogurt and cheeses. ? Try not to drink whole milk or cream. ? Try not to eat ice cream, egg yolks, or full-fat cheeses.  Healthy desserts include angel food cake, ginger snaps, animal crackers, hard candy, popsicles, and low-fat or nonfat frozen yogurt. Try not to eat pastries, cakes, pies, and cookies.  Exercise Follow your exercise program as told by your doctor.  Be more active. Try gardening, walking, and taking the stairs.  Ask your doctor about ways that you can be more active.  Medicine  Take over-the-counter and prescription medicines only as told by your doctor. This information is not intended to replace advice given to you by your health care provider. Make sure you discuss any questions you have with your health care provider. Document Released: 09/04/2008 Document Revised: 01/08/2016 Document Reviewed: 12/19/2015 Elsevier Interactive Patient Education  [employer].

## 2017-11-26 NOTE — Assessment & Plan Note (Signed)
Patient with hyperlipidemia and CAD currently not to lipid goals.  Patient has been only partially compliant with her atorvastatin due to pill size.  Will switch her to rosuvastatin 40 mg once daily and she should take this for 6-8 weeks on a daily basis.  At that time we will repeat lipid labs to determine if she is to goal or not.   She was encouraged to continue with her regular exercise routines and continue watching her diet closely.   Once we get repeat labs drawn (by the first of August), we can determine if she will need a PCSK-9 inhibitor.

## 2017-12-22 ENCOUNTER — Ambulatory Visit: Payer: PPO | Admitting: Internal Medicine

## 2018-01-06 DIAGNOSIS — M25552 Pain in left hip: Secondary | ICD-10-CM | POA: Diagnosis not present

## 2018-01-06 DIAGNOSIS — Z96642 Presence of left artificial hip joint: Secondary | ICD-10-CM | POA: Diagnosis not present

## 2018-01-06 DIAGNOSIS — M542 Cervicalgia: Secondary | ICD-10-CM | POA: Diagnosis not present

## 2018-01-06 DIAGNOSIS — M25551 Pain in right hip: Secondary | ICD-10-CM | POA: Diagnosis not present

## 2018-01-12 ENCOUNTER — Inpatient Hospital Stay
Admission: EM | Admit: 2018-01-12 | Discharge: 2018-01-14 | DRG: 303 | Disposition: A | Discharge status: Home / Self Care | Payer: PPO | Attending: Internal Medicine | Admitting: Internal Medicine

## 2018-01-12 ENCOUNTER — Other Ambulatory Visit: Payer: Self-pay

## 2018-01-12 ENCOUNTER — Emergency Department: Payer: PPO

## 2018-01-12 DIAGNOSIS — Z96642 Presence of left artificial hip joint: Secondary | ICD-10-CM | POA: Diagnosis present

## 2018-01-12 DIAGNOSIS — R7989 Other specified abnormal findings of blood chemistry: Secondary | ICD-10-CM | POA: Diagnosis present

## 2018-01-12 DIAGNOSIS — N179 Acute kidney failure, unspecified: Secondary | ICD-10-CM

## 2018-01-12 DIAGNOSIS — R0602 Shortness of breath: Secondary | ICD-10-CM

## 2018-01-12 DIAGNOSIS — Z9884 Bariatric surgery status: Secondary | ICD-10-CM

## 2018-01-12 DIAGNOSIS — R001 Bradycardia, unspecified: Secondary | ICD-10-CM | POA: Diagnosis not present

## 2018-01-12 DIAGNOSIS — F329 Major depressive disorder, single episode, unspecified: Secondary | ICD-10-CM | POA: Diagnosis not present

## 2018-01-12 DIAGNOSIS — Z9049 Acquired absence of other specified parts of digestive tract: Secondary | ICD-10-CM

## 2018-01-12 DIAGNOSIS — Z7901 Long term (current) use of anticoagulants: Secondary | ICD-10-CM | POA: Diagnosis not present

## 2018-01-12 DIAGNOSIS — R072 Precordial pain: Secondary | ICD-10-CM | POA: Diagnosis not present

## 2018-01-12 DIAGNOSIS — R079 Chest pain, unspecified: Secondary | ICD-10-CM

## 2018-01-12 DIAGNOSIS — Z881 Allergy status to other antibiotic agents status: Secondary | ICD-10-CM

## 2018-01-12 DIAGNOSIS — Z7902 Long term (current) use of antithrombotics/antiplatelets: Secondary | ICD-10-CM

## 2018-01-12 DIAGNOSIS — E785 Hyperlipidemia, unspecified: Secondary | ICD-10-CM | POA: Diagnosis not present

## 2018-01-12 DIAGNOSIS — E876 Hypokalemia: Secondary | ICD-10-CM | POA: Diagnosis present

## 2018-01-12 DIAGNOSIS — Z7982 Long term (current) use of aspirin: Secondary | ICD-10-CM

## 2018-01-12 DIAGNOSIS — Z8673 Personal history of transient ischemic attack (TIA), and cerebral infarction without residual deficits: Secondary | ICD-10-CM | POA: Diagnosis not present

## 2018-01-12 DIAGNOSIS — R0789 Other chest pain: Secondary | ICD-10-CM

## 2018-01-12 DIAGNOSIS — E860 Dehydration: Secondary | ICD-10-CM | POA: Diagnosis present

## 2018-01-12 DIAGNOSIS — E1165 Type 2 diabetes mellitus with hyperglycemia: Secondary | ICD-10-CM | POA: Diagnosis present

## 2018-01-12 DIAGNOSIS — I10 Essential (primary) hypertension: Secondary | ICD-10-CM | POA: Diagnosis not present

## 2018-01-12 DIAGNOSIS — I959 Hypotension, unspecified: Secondary | ICD-10-CM | POA: Diagnosis not present

## 2018-01-12 DIAGNOSIS — Z955 Presence of coronary angioplasty implant and graft: Secondary | ICD-10-CM

## 2018-01-12 DIAGNOSIS — F419 Anxiety disorder, unspecified: Secondary | ICD-10-CM | POA: Diagnosis not present

## 2018-01-12 DIAGNOSIS — R05 Cough: Secondary | ICD-10-CM | POA: Diagnosis not present

## 2018-01-12 DIAGNOSIS — E7849 Other hyperlipidemia: Secondary | ICD-10-CM | POA: Diagnosis not present

## 2018-01-12 DIAGNOSIS — R0609 Other forms of dyspnea: Secondary | ICD-10-CM | POA: Diagnosis not present

## 2018-01-12 DIAGNOSIS — D649 Anemia, unspecified: Secondary | ICD-10-CM | POA: Diagnosis not present

## 2018-01-12 DIAGNOSIS — Z87891 Personal history of nicotine dependence: Secondary | ICD-10-CM

## 2018-01-12 DIAGNOSIS — Z91048 Other nonmedicinal substance allergy status: Secondary | ICD-10-CM

## 2018-01-12 DIAGNOSIS — I119 Hypertensive heart disease without heart failure: Secondary | ICD-10-CM | POA: Diagnosis not present

## 2018-01-12 DIAGNOSIS — Z90710 Acquired absence of both cervix and uterus: Secondary | ICD-10-CM

## 2018-01-12 DIAGNOSIS — I200 Unstable angina: Secondary | ICD-10-CM | POA: Diagnosis present

## 2018-01-12 DIAGNOSIS — K219 Gastro-esophageal reflux disease without esophagitis: Secondary | ICD-10-CM | POA: Diagnosis present

## 2018-01-12 DIAGNOSIS — D72829 Elevated white blood cell count, unspecified: Secondary | ICD-10-CM | POA: Diagnosis not present

## 2018-01-12 DIAGNOSIS — Z79899 Other long term (current) drug therapy: Secondary | ICD-10-CM

## 2018-01-12 DIAGNOSIS — I4891 Unspecified atrial fibrillation: Secondary | ICD-10-CM | POA: Diagnosis not present

## 2018-01-12 DIAGNOSIS — I25110 Atherosclerotic heart disease of native coronary artery with unstable angina pectoris: Secondary | ICD-10-CM | POA: Diagnosis not present

## 2018-01-12 DIAGNOSIS — Z8249 Family history of ischemic heart disease and other diseases of the circulatory system: Secondary | ICD-10-CM

## 2018-01-12 DIAGNOSIS — A419 Sepsis, unspecified organism: Secondary | ICD-10-CM | POA: Diagnosis not present

## 2018-01-12 DIAGNOSIS — I252 Old myocardial infarction: Secondary | ICD-10-CM

## 2018-01-12 DIAGNOSIS — E119 Type 2 diabetes mellitus without complications: Secondary | ICD-10-CM

## 2018-01-12 LAB — CBC
HEMATOCRIT: 32.8 % — AB (ref 35.0–47.0)
HEMOGLOBIN: 10.7 g/dL — AB (ref 12.0–16.0)
MCH: 24 pg — ABNORMAL LOW (ref 26.0–34.0)
MCHC: 32.7 g/dL (ref 32.0–36.0)
MCV: 73.4 fL — ABNORMAL LOW (ref 80.0–100.0)
Platelets: 260 10*3/uL (ref 150–440)
RBC: 4.46 MIL/uL (ref 3.80–5.20)
RDW: 17.4 % — ABNORMAL HIGH (ref 11.5–14.5)
WBC: 11.2 10*3/uL — AB (ref 3.6–11.0)

## 2018-01-12 LAB — BASIC METABOLIC PANEL
Anion gap: 7 (ref 5–15)
BUN: 13 mg/dL (ref 8–23)
CHLORIDE: 110 mmol/L (ref 98–111)
CO2: 22 mmol/L (ref 22–32)
Calcium: 8.6 mg/dL — ABNORMAL LOW (ref 8.9–10.3)
Creatinine, Ser: 1.21 mg/dL — ABNORMAL HIGH (ref 0.44–1.00)
GFR calc Af Amer: 48 mL/min — ABNORMAL LOW (ref >60)
GFR, EST NON AFRICAN AMERICAN: 41 mL/min — AB (ref >60)
Glucose, Bld: 222 mg/dL — ABNORMAL HIGH (ref 70–99)
POTASSIUM: 2.6 mmol/L — AB (ref 3.5–5.1)
SODIUM: 139 mmol/L (ref 135–145)

## 2018-01-12 LAB — HEPATIC FUNCTION PANEL
ALT: 85 U/L — ABNORMAL HIGH (ref 0–44)
AST: 145 U/L — ABNORMAL HIGH (ref 15–41)
Albumin: 3.2 g/dL — ABNORMAL LOW (ref 3.5–5.0)
Alkaline Phosphatase: 118 U/L (ref 38–126)
BILIRUBIN DIRECT: 0.2 mg/dL (ref 0.0–0.2)
BILIRUBIN INDIRECT: 0.6 mg/dL (ref 0.3–0.9)
Total Bilirubin: 0.8 mg/dL (ref 0.3–1.2)
Total Protein: 6.1 g/dL — ABNORMAL LOW (ref 6.5–8.1)

## 2018-01-12 LAB — POTASSIUM: Potassium: 2.9 mmol/L — ABNORMAL LOW (ref 3.5–5.1)

## 2018-01-12 LAB — TSH: TSH: 3.272 u[IU]/mL (ref 0.350–4.500)

## 2018-01-12 LAB — BRAIN NATRIURETIC PEPTIDE: B Natriuretic Peptide: 223 pg/mL — ABNORMAL HIGH (ref 0.0–100.0)

## 2018-01-12 LAB — TROPONIN I
TROPONIN I: 0.03 ng/mL — AB (ref <0.03)
Troponin I: 0.03 ng/mL (ref <0.03)

## 2018-01-12 LAB — PROTIME-INR
INR: 1.07
PROTHROMBIN TIME: 13.8 s (ref 11.4–15.2)

## 2018-01-12 LAB — MAGNESIUM: Magnesium: 2.8 mg/dL — ABNORMAL HIGH (ref 1.7–2.4)

## 2018-01-12 MED ORDER — ONDANSETRON HCL 4 MG/2ML IJ SOLN: 4.0000 mg | Freq: Four times a day (QID) | INTRAMUSCULAR | Status: DC | PRN

## 2018-01-12 MED ORDER — SODIUM CHLORIDE 0.9 % IV BOLUS
1000.0000 mL | Freq: Once | INTRAVENOUS | Status: AC
  Administered 2018-01-12: 1000 mL via INTRAVENOUS

## 2018-01-12 MED ORDER — ACETAMINOPHEN 650 MG RE SUPP: 650.0000 mg | Freq: Four times a day (QID) | RECTAL | Status: DC | PRN

## 2018-01-12 MED ORDER — ENOXAPARIN SODIUM 40 MG/0.4ML ~~LOC~~ SOLN
40.0000 mg | SUBCUTANEOUS | Status: DC
  Administered 2018-01-12: 40 mg via SUBCUTANEOUS
  Filled 2018-01-12: qty 0.4

## 2018-01-12 MED ORDER — SERTRALINE HCL 50 MG PO TABS
100.0000 mg | ORAL_TABLET | Freq: Every day | ORAL | Status: DC
  Administered 2018-01-13 – 2018-01-14 (×2): 100 mg via ORAL
  Filled 2018-01-12 (×2): qty 2

## 2018-01-12 MED ORDER — ATORVASTATIN CALCIUM 20 MG PO TABS: 80.0000 mg | ORAL_TABLET | Freq: Every evening | ORAL | Status: DC

## 2018-01-12 MED ORDER — AMLODIPINE BESYLATE 5 MG PO TABS
5.0000 mg | ORAL_TABLET | Freq: Every day | ORAL | Status: DC
  Administered 2018-01-12 – 2018-01-14 (×3): 5 mg via ORAL
  Filled 2018-01-12 (×3): qty 1

## 2018-01-12 MED ORDER — PANTOPRAZOLE SODIUM 40 MG PO TBEC
40.0000 mg | DELAYED_RELEASE_TABLET | Freq: Every day | ORAL | Status: DC
  Administered 2018-01-13 – 2018-01-14 (×2): 40 mg via ORAL
  Filled 2018-01-12 (×2): qty 1

## 2018-01-12 MED ORDER — MAGNESIUM SULFATE 2 GM/50ML IV SOLN
2.0000 g | Freq: Once | INTRAVENOUS | Status: AC
  Administered 2018-01-12: 2 g via INTRAVENOUS
  Filled 2018-01-12: qty 50

## 2018-01-12 MED ORDER — FLUOXETINE HCL 20 MG PO CAPS: 20.0000 mg | ORAL_CAPSULE | Freq: Every day | ORAL | Status: DC

## 2018-01-12 MED ORDER — ROSUVASTATIN CALCIUM 10 MG PO TABS: 40.0000 mg | ORAL_TABLET | Freq: Every day | ORAL | Status: DC

## 2018-01-12 MED ORDER — ASPIRIN 81 MG PO CHEW
162.0000 mg | CHEWABLE_TABLET | Freq: Once | ORAL | Status: AC
  Administered 2018-01-12: 162 mg via ORAL
  Filled 2018-01-12: qty 2

## 2018-01-12 MED ORDER — NITROGLYCERIN 0.4 MG SL SUBL: 0.4000 mg | SUBLINGUAL_TABLET | SUBLINGUAL | Status: DC | PRN

## 2018-01-12 MED ORDER — POTASSIUM CHLORIDE 10 MEQ/100ML IV SOLN
10.0000 meq | INTRAVENOUS | Status: AC
  Administered 2018-01-12 (×2): 10 meq via INTRAVENOUS
  Filled 2018-01-12 (×2): qty 100

## 2018-01-12 MED ORDER — TICAGRELOR 90 MG PO TABS: 90.0000 mg | ORAL_TABLET | Freq: Two times a day (BID) | ORAL | Status: DC

## 2018-01-12 MED ORDER — FAMOTIDINE 20 MG PO TABS: 10.0000 mg | ORAL_TABLET | Freq: Every day | ORAL | Status: DC

## 2018-01-12 MED ORDER — ONDANSETRON HCL 4 MG PO TABS: 4.0000 mg | ORAL_TABLET | Freq: Four times a day (QID) | ORAL | Status: DC | PRN

## 2018-01-12 MED ORDER — POTASSIUM CHLORIDE 20 MEQ PO PACK
40.0000 meq | PACK | Freq: Once | ORAL | Status: AC
  Administered 2018-01-12: 40 meq via ORAL
  Filled 2018-01-12: qty 2

## 2018-01-12 MED ORDER — ONDANSETRON HCL 4 MG/2ML IJ SOLN
4.0000 mg | Freq: Once | INTRAMUSCULAR | Status: AC
  Administered 2018-01-12: 4 mg via INTRAVENOUS
  Filled 2018-01-12: qty 2

## 2018-01-12 MED ORDER — ROSUVASTATIN CALCIUM 10 MG PO TABS
40.0000 mg | ORAL_TABLET | Freq: Every day | ORAL | Status: DC
  Administered 2018-01-13 – 2018-01-14 (×2): 40 mg via ORAL
  Filled 2018-01-12 (×2): qty 4

## 2018-01-12 MED ORDER — ASPIRIN EC 81 MG PO TBEC
81.0000 mg | DELAYED_RELEASE_TABLET | Freq: Every day | ORAL | Status: DC
  Administered 2018-01-13: 81 mg via ORAL
  Filled 2018-01-12: qty 1

## 2018-01-12 MED ORDER — ACETAMINOPHEN 325 MG PO TABS: 650.0000 mg | ORAL_TABLET | Freq: Four times a day (QID) | ORAL | Status: DC | PRN

## 2018-01-12 MED ORDER — CLOPIDOGREL BISULFATE 75 MG PO TABS: 75.0000 mg | ORAL_TABLET | Freq: Every day | ORAL | Status: DC

## 2018-01-12 MED ORDER — TICAGRELOR 90 MG PO TABS
90.0000 mg | ORAL_TABLET | Freq: Two times a day (BID) | ORAL | Status: DC
  Administered 2018-01-12 – 2018-01-13 (×2): 90 mg via ORAL
  Filled 2018-01-12 (×2): qty 1

## 2018-01-12 MED ORDER — POTASSIUM CHLORIDE CRYS ER 20 MEQ PO TBCR
80.0000 meq | EXTENDED_RELEASE_TABLET | Freq: Two times a day (BID) | ORAL | Status: DC
  Administered 2018-01-12: 80 meq via ORAL
  Filled 2018-01-12: qty 4

## 2018-01-12 MED ORDER — EZETIMIBE 10 MG PO TABS
10.0000 mg | ORAL_TABLET | Freq: Every evening | ORAL | Status: DC
  Administered 2018-01-12 – 2018-01-13 (×2): 10 mg via ORAL
  Filled 2018-01-12 (×2): qty 1
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
## 2018-01-12 NOTE — Progress Notes (Signed)
[place] for Potassium Replacement Indication: hypokalemia  Allergies  Allergen Reactions  . Erythromycin Nausea Only  . Other Rash    Micropore tape  . Tape Itching and Rash    Also "band-aids"    Labs: BMP Latest Ref Rng & Units 01/12/2018 01/12/2018 10/18/2017  Glucose 70 - 99 mg/dL - 222(H) 215(H)  BUN 8 - 23 mg/dL - 13 18  Creatinine 0.44 - 1.00 mg/dL - 1.21(H) 0.73  BUN/Creat Ratio 12 - 28 - - 25  Sodium 135 - 145 mmol/L - 139 144  Potassium 3.5 - 5.1 mmol/L 2.9(L) 2.6(LL) 4.1  Chloride 98 - 111 mmol/L - 110 106  CO2 22 - 32 mmol/L - 22 22  Calcium 8.9 - 10.3 mg/dL - 8.6(L) 9.2    Estimated Creatinine Clearance: 42.5 mL/min (A) (by C-G formula based on SCr of 1.21 mg/dL (H)).  Assessment: Patient is a 77yo female admitted for unstable angina. Pharmacy consulted for potassium replacement. On admission K level is 2.6, received KCl 82mEq PO x 1 dose and Mag Sulfate 2g IV x 1 dose in the ED. Patient can take oral meds.  Goal of Therapy:  Maintain electrolytes within range  Plan:  7939 @ 2055: K: 2.9. Will order KCL 58mEq x 2 doses and KCL 47mEq PO x 1 dose.  Recheck K+ with AM labs and continue to replace as needed.   Pernell Dupre, PharmD, BCPS Clinical Pharmacist 01/12/2018 10:17 PM

## 2018-01-12 NOTE — ED Notes (Signed)
Informed RN that patient has been roomed and is ready for evaluation.  Patient in NAD at this time and call bell placed within reach.   

## 2018-01-12 NOTE — Progress Notes (Signed)
Family Meeting Note  Advance Directive:yes  Today a meeting took place with the Margaret [name] ans dter Margaret [name]  Patient is being admitted for workup on chest pain shortness of breath feeling fatigued and malaise. She was found to have bradycardia and hypokalemia. Had borderline elevated LFTs as outpatient. A dress code status patient wants to be full code. She has known coronary arteries disease with stent placement in the past  17 mins Fritzi Mandes, MD

## 2018-01-12 NOTE — ED Notes (Signed)
First RN note:  Patient sent by Dr. Edwina Barth stating that the patient has abnormal labs with decreased potassium and elevated liver enzymes.  Patient states she also has a "heaviness in my  Chest ".  Patient has a cough and soft BP of 90/60.

## 2018-01-12 NOTE — ED Triage Notes (Signed)
Pt arrives to ED for central CP that is a tightness and SOB. CP began this AM. Coughing x few weeks, productive, brown sputum.   Alert, oriented. In wheelchair.

## 2018-01-12 NOTE — Progress Notes (Signed)
[facility] for Potassium Replacement Indication: hypokalemia  Allergies  Allergen Reactions  . Erythromycin Nausea Only  . Other Rash    Micropore tape  . Tape Itching and Rash    Also "band-aids"    Labs: BMP Latest Ref Rng & Units 01/12/2018 10/18/2017 09/12/2017  Glucose 70 - 99 mg/dL 222(H) 215(H) 200(H)  BUN 8 - 23 mg/dL 13 18 16   Creatinine 0.44 - 1.00 mg/dL 1.21(H) 0.73 0.94  BUN/Creat Ratio 12 - 28 - 25 -  Sodium 135 - 145 mmol/L 139 144 142  Potassium 3.5 - 5.1 mmol/L 2.6(LL) 4.1 3.3(L)  Chloride 98 - 111 mmol/L 110 106 111  CO2 22 - 32 mmol/L 22 22 21(L)  Calcium 8.9 - 10.3 mg/dL 8.6(L) 9.2 8.8(L)    Estimated Creatinine Clearance: 42.5 mL/min (A) (by C-G formula based on SCr of 1.21 mg/dL (H)).  Assessment: Patient is a 77yo female admitted for unstable angina. Pharmacy consulted for potassium replacement. On admission K level is 2.6, received KCl 57mEq PO x 1 dose and Mag Sulfate 2g IV x 1 dose in the ED. Patient can take oral meds.  Goal of Therapy:  Maintain electrolytes within range  Plan:  Will recheck K and Mag at 21:00 and assess need for further replacement.  Paulina Fusi, PharmD, BCPS 01/12/2018 4:02 PM
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
## 2018-01-13 DIAGNOSIS — R001 Bradycardia, unspecified: Secondary | ICD-10-CM

## 2018-01-13 LAB — BASIC METABOLIC PANEL
ANION GAP: 2 — AB (ref 5–15)
BUN: 11 mg/dL (ref 8–23)
CO2: 26 mmol/L (ref 22–32)
Calcium: 8.3 mg/dL — ABNORMAL LOW (ref 8.9–10.3)
Chloride: 117 mmol/L — ABNORMAL HIGH (ref 98–111)
Creatinine, Ser: 1.18 mg/dL — ABNORMAL HIGH (ref 0.44–1.00)
GFR calc Af Amer: 49 mL/min — ABNORMAL LOW (ref >60)
GFR calc non Af Amer: 43 mL/min — ABNORMAL LOW (ref >60)
GLUCOSE: 151 mg/dL — AB (ref 70–99)
POTASSIUM: 3.7 mmol/L (ref 3.5–5.1)
Sodium: 145 mmol/L (ref 135–145)

## 2018-01-13 LAB — GLUCOSE, CAPILLARY: Glucose-Capillary: 201 mg/dL — ABNORMAL HIGH (ref 70–99)

## 2018-01-13 LAB — HEMOGLOBIN A1C
HEMOGLOBIN A1C: 7.4 % — AB (ref 4.8–5.6)
Mean Plasma Glucose: 165.68 mg/dL

## 2018-01-13 LAB — TROPONIN I
Troponin I: 0.03 ng/mL (ref <0.03)
Troponin I: 0.03 ng/mL (ref <0.03)

## 2018-01-13 MED ORDER — SODIUM CHLORIDE 0.9% FLUSH
3.0000 mL | Freq: Two times a day (BID) | INTRAVENOUS | Status: DC
Start: 2018-01-13 — End: 2018-01-14
  Administered 2018-01-13 – 2018-01-14 (×2): 3 mL via INTRAVENOUS

## 2018-01-13 MED ORDER — INSULIN ASPART 100 UNIT/ML ~~LOC~~ SOLN
0.0000 [IU] | Freq: Every day | SUBCUTANEOUS | Status: DC
  Administered 2018-01-13: 2 [IU] via SUBCUTANEOUS
  Filled 2018-01-13: qty 1

## 2018-01-13 MED ORDER — POTASSIUM CHLORIDE CRYS ER 20 MEQ PO TBCR
20.0000 meq | EXTENDED_RELEASE_TABLET | Freq: Once | ORAL | Status: AC
  Administered 2018-01-13: 20 meq via ORAL
  Filled 2018-01-13: qty 1

## 2018-01-13 MED ORDER — CLOPIDOGREL BISULFATE 75 MG PO TABS
75.0000 mg | ORAL_TABLET | Freq: Every day | ORAL | Status: DC
  Administered 2018-01-14: 75 mg via ORAL
  Filled 2018-01-13: qty 1

## 2018-01-13 MED ORDER — APIXABAN 5 MG PO TABS
5.0000 mg | ORAL_TABLET | Freq: Two times a day (BID) | ORAL | Status: DC
  Administered 2018-01-13 – 2018-01-14 (×3): 5 mg via ORAL
  Filled 2018-01-13 (×3): qty 1

## 2018-01-13 MED ORDER — INSULIN ASPART 100 UNIT/ML ~~LOC~~ SOLN: 0.0000 [IU] | Freq: Three times a day (TID) | SUBCUTANEOUS | Status: DC

## 2018-01-13 MED ORDER — CLOPIDOGREL BISULFATE 75 MG PO TABS
300.0000 mg | ORAL_TABLET | Freq: Once | ORAL | Status: AC
  Administered 2018-01-13: 300 mg via ORAL
  Filled 2018-01-13: qty 4

## 2018-01-13 NOTE — Progress Notes (Signed)
Progress Note  Patient Name: Margaret [name] Date of Encounter: 01/13/2018  Primary Cardiologist: [place]  Subjective   No complaints this morning, though "I just woke up." Heart rate remains mildly bradycardic, though is improving with holding of beta blockers. WIth movement in the bed she will improve her heart rate to the 59 bpm range. Troponin flat trending at 0.03. Potassium improving to 3.7. SCr 1.21-->1.18. TSH normal. Magnesium 2.8. A1c 7.4.   Inpatient Medications    Scheduled Meds: . amLODipine  5 mg Oral Daily  . aspirin EC  81 mg Oral Daily  . enoxaparin (LOVENOX) injection  40 mg Subcutaneous Q24H  . ezetimibe  10 mg Oral QPM  . pantoprazole  40 mg Oral Daily  . rosuvastatin  40 mg Oral Daily  . sertraline  100 mg Oral Daily  . ticagrelor  90 mg Oral BID   Continuous Infusions:  PRN Meds: acetaminophen **OR** acetaminophen, nitroGLYCERIN, ondansetron **OR** ondansetron (ZOFRAN) IV   Vital Signs    Vitals:   01/12/18 1630 01/12/18 1709 01/12/18 2100 01/13/18 0406  BP: (!) 152/55 (!) 152/65 (!) 154/59 (!) 149/47  Pulse: (!) 43 (!) 41 (!) 47 (!) 49  Resp: 11 16 16 18   Temp:  97.6 F (36.4 C) 98.3 F (36.8 C) 98 F (36.7 C)  TempSrc:  Oral  Oral  SpO2: 97% 100% 100% 100%  Weight:      Height:        Intake/Output Summary (Last 24 hours) at 01/13/2018 0730 Last data filed at 01/13/2018 0430 Gross per 24 hour  Intake -  Output 250 ml  Net -250 ml   Filed Weights   01/12/18 1342  Weight: 182 lb (82.6 kg)    Telemetry    Sinus bradycardia, upper 40s to 50s bpm - Personally Reviewed  ECG    n/a - Personally Reviewed  Physical Exam   GEN: No acute distress.   Neck: No JVD. Cardiac: Bradycardic, I/VI systolic murmur at the apex, no rubs, or gallops.  Respiratory: Clear to auscultation bilaterally.  GI: Soft, nontender, non-distended.   MS: No edema; No deformity. Neuro:  Alert and oriented x 3; Nonfocal.  Psych: Normal affect.  Labs      Chemistry Recent Labs  Lab 01/12/18 1349 01/12/18 2055 01/13/18 0527  NA 139  --  145  K 2.6* 2.9* 3.7  CL 110  --  117*  CO2 22  --  26  GLUCOSE 222*  --  151*  BUN 13  --  11  CREATININE 1.21*  --  1.18*  CALCIUM 8.6*  --  8.3*  PROT 6.1*  --   --   ALBUMIN 3.2*  --   --   AST 145*  --   --   ALT 85*  --   --   ALKPHOS 118  --   --   BILITOT 0.8  --   --   GFRNONAA 41*  --  43*  GFRAA 48*  --  49*  ANIONGAP 7  --  2*     Hematology Recent Labs  Lab 01/12/18 1349  WBC 11.2*  RBC 4.46  HGB 10.7*  HCT 32.8*  MCV 73.4*  MCH 24.0*  MCHC 32.7  RDW 17.4*  PLT 260    Cardiac Enzymes Recent Labs  Lab 01/12/18 1349 01/12/18 1800 01/12/18 2323 01/13/18 0527  TROPONINI 0.03* 0.03* 0.03* 0.03*   No results for input(s): TROPIPOC in the last 168 hours.  BNP Recent Labs  Lab 01/12/18 1349  BNP 223.0*     DDimer No results for input(s): DDIMER in the last 168 hours.   Radiology    Dg Chest 2 View  Result Date: 01/12/2018 IMPRESSION: No acute abnormality.  Chronic cardiomegaly. Electronically Signed   By: Lorriane Shire M.D.   On: 01/12/2018 14:12    Cardiac Studies   LHC 05/2017: Conclusion     Mid RCA lesion is 80% stenosed.  Dist RCA lesion is 100% stenosed.  There is mild left ventricular systolic dysfunction.  LV end diastolic pressure is mildly elevated.  Ost Cx to Prox Cx lesion is 40% stenosed.  Prox Cx to Mid Cx lesion is 60% stenosed.  Ost 2nd Mrg to 2nd Mrg lesion is 30% stenosed.  Prox LAD lesion is 70% stenosed.  Ost 1st Diag to 1st Diag lesion is 90% stenosed.  A drug-eluting stent was successfully placed using a STENT SIERRA 2.50 X 38 MM.  Post intervention, there is a 0% residual stenosis.  Post intervention, there is a 0% residual stenosis.  A stent was successfully placed.  1. Severe one-vessel coronary artery disease with very late stent thrombosis affecting the distal right coronary artery. There is also  borderline significant proximal to mid LAD stenosis at the origin of a diagonal branch and moderate left circumflex disease. The coronary arteries are overall moderately calcified. 2. Mildly reduced LV systolic function with an EF of 40% and inferior wall hypokinesis. Mildly elevated left ventricular end-diastolic pressure. 3. Successful complex angioplasty and 2 overlapped drug-eluting stent placement to the right coronary artery with extensive balloon angioplasty and aspiration thrombectomy.  Recommendations: I am going to switch from Plavix to Brilinta given very late stent thrombosis. Continue this for at least one year and preferably longer. Aggressive treatment of risk factors. LAD disease can likely be treated medically. However, if she has residual anginal symptoms, stress testing or pressure wire evaluation can be done to the LAD. This was a difficult and prolonged procedure due to large thrombus, calcifications and tortuosity. The patient will be given precautions for exceeding radiation limits.     Echo 06/15/2017: Study Conclusions  - Left ventricle: The cavity size was normal. There was mild concentric hypertrophy. Systolic function was normal. The estimated ejection fraction was in the range of 55% to 60%. Mild hypokinesis of the inferior myocardium. Features are consistent with a pseudonormal left ventricular filling pattern, with concomitant abnormal relaxation and increased filling pressure (grade 2 diastolic dysfunction). - Aortic valve: There was trivial regurgitation. - Mitral valve: There was mild regurgitation. - Left atrium: The atrium was moderately dilated. - Right atrium: The atrium was mildly dilated. - Pericardium, extracardiac: A trivial pericardial effusion was identified posterior to the heart.  Patient Profile     77 y.o. female with history of CAD s/p PCI to the RCA, HTN, HLD, right PCA stroke in 10/2015 followed by [facility], chronic dyspnea, anxiety and depression, morbid obesity s/p gastric bypass, and GERD who is being seen today for the evaluation of chest pain.  Assessment & Plan    1. Elevated troponin/chest tightness/CAD: -Minimally elevated and flat trending at 0.03 -This is not consistent with ACS -No indication for heparin gtt at this time  -Possibly supply demand ischemia in the setting of known CAD as above with dehydration, AKI, and possibly spasm with hypokalemia  -Continue ASA and Brilinta indefinitely per primary cardiologist   -No plans for inpatient ischemic evaluation at this time  2. Severe hypokalemia: -Improving -Recommend repletion to goal > 4.0 -Possibly in the setting of her recent diarrhea -Not currently on a diuretic (was previously on HCTZ)    3. Bradycardia: -In the setting of her taking two beta blockers -Hold Both Coreg and Lopressor for now -Prior to discharge, consider resumption of Coreg at a low dose if heart rate allows -TSH normal -Ambulate this morning to assess for appropriate chronotropic response -I suspect she will have appropriate chronotropic response given her heart rate improved to 59 bpm with just sitting up in bed to listen to her lungs on exam  4. Fatigue: -Likely in the setting of her taking both Coreg and Lopressor as well as dehydration in the setting of diarrhea and physical deconditioning -Cannot rule out her newly diagnosed DM playing a role as well   -Recommend PT evaluation   5. Anemia: -No CBC this morning -Likely contributing to the above  6. AKI: -Improving -Likely ATN from dehydration -Gentle IV fluids  7. Leukocytosis: -No CBC this morning -No obvious source of infection currently -Possibly inflammatory -Per IM  8. New onset DM: -A1c 7.4 -Per IM -Consider diabetic consult   9. HLD: -Being evaluated for possible PCSK-9 inhibitor as an outpatient  -Goal LDL < 70 -LDL of 129 from 09/2017 -Remains on Lipitor  80 mg daily as well as Zetia 10 mg daily (however patient was advised to start Crestor on 6/6 in place of Lipitor following completion of her remaining Lipitor supply) -LFT normal  10. Medication reconciliation: -Pharmacy has been consulted for medication reconciliation given numerous duplicate medications noted in outpatient note   For questions or updates, please contact [place] Please consult [email or web address] for contact info under Cardiology/STEMI.    Signed, Christell Faith, PA-C [facility] Pager: [phone] 01/13/2018, 7:30 AM

## 2018-01-13 NOTE — Progress Notes (Addendum)
[place] for Potassium Replacement Indication: hypokalemia  Allergies  Allergen Reactions  . Erythromycin Nausea Only  . Other Rash    Micropore tape  . Tape Itching and Rash    Also "band-aids"    Labs: BMP Latest Ref Rng & Units 01/13/2018 01/12/2018 01/12/2018  Glucose 70 - 99 mg/dL 151(H) - 222(H)  BUN 8 - 23 mg/dL 11 - 13  Creatinine 0.44 - 1.00 mg/dL 1.18(H) - 1.21(H)  BUN/Creat Ratio 12 - 28 - - -  Sodium 135 - 145 mmol/L 145 - 139  Potassium 3.5 - 5.1 mmol/L 3.7 2.9(L) 2.6(LL)  Chloride 98 - 111 mmol/L 117(H) - 110  CO2 22 - 32 mmol/L 26 - 22  Calcium 8.9 - 10.3 mg/dL 8.3(L) - 8.6(L)    Estimated Creatinine Clearance: 43.6 mL/min (A) (by C-G formula based on SCr of 1.18 mg/dL (H)).  Assessment: Patient is a 77yo female admitted for unstable angina. Pharmacy consulted for potassium replacement. On admission K level is 2.6, received KCl 31mEq PO x 1 dose and Mag Sulfate 2g IV x 1 dose in the ED. Patient can take oral meds.  Goal of Therapy:  Maintain electrolytes within range  Plan:  K=3.7 this AM. Pt received a large amount of K last evening, which may not be completely reflected on AM labs. I will however give an additional 20 MEQ this AM in attempts to keep K > 4.  Ramond Dial, PharmD, BCPS Clinical Pharmacist 01/13/2018 6:56 AM

## 2018-01-13 NOTE — Progress Notes (Signed)
Patient has developed new onset Afib with RVR today with heart rates into the 120s bpm. Currently in sinus rhythm with heart rates in the 60s bpm. She has been off beta blockers since the morning of 7/24 given sinus bradycardia in the setting of being on both metoprolol and Coreg. CHADS2VASc at least 8 (HTN, age x 2, DM, stroke x 2, vascular disease, female). Case was discussed with Dr. Rockey Situ who agrees this is Afib. He prefers we use amiodarone 200 mg bid in place of beta blocker therapy at this time given her prior issues on beta blockers recently. She is not a flecainide candidate given her ischemic heart disease. I have discussed with Dr. Fletcher Anon given the patient's multiple layers of stents in the RCA with recommendation for indefinite antiplatelet therapy; we have decided to discontinue the patient's   ASA and Brilinta. She will receive Plavix 300 mg this evening (at time of her prior dosed Brilinta) followed by 75 mg daily on 7/26 and will be placed on Eliquis 5 mg bid (does not meet reduced dosing criteria).

## 2018-01-13 NOTE — Progress Notes (Signed)
Dr. Bridgett Larsson aware of Afib episode.- Cardiology following. Per Dr. Bridgett Larsson, order for diabetes coordinator consult as patient has HgbA1c of >7. Family also requesting dietician consult. Okay to order per MD.

## 2018-01-13 NOTE — Progress Notes (Signed)
Inpatient Diabetes Program Recommendations  AACE/ADA: New Consensus Statement on Inpatient Glycemic Control (2019)  Target Ranges:  Prepandial:   less than 140 mg/dL      Peak postprandial:   less than 180 mg/dL (1-2 hours)      Critically ill patients:  140 - 180 mg/dL   Results for SHARLOT, STURKEY (MRN [id number]) as of 01/13/2018 12:03  Ref. Range 01/12/2018 13:49 01/12/2018 20:55 01/13/2018 05:27  Glucose Latest Ref Range: 70 - 99 mg/dL 222 (H)  151 (H)  Hemoglobin A1C Latest Ref Range: 4.8 - 5.6 %  7.4 (H)    Review of Glycemic Control  Diabetes history: No Outpatient Diabetes medications: NA Current orders for Inpatient glycemic control: None  Inpatient Diabetes Program Recommendations: Insulin-Correction: While inpatient, please consider ordering CBGs with Novolog 0-9 units TID and Novolog 0-5 units QHS. HgbA1C: A1C 7.4% on 01/12/18. Per ADA, if A1C is 6.5% or greater then criteria met to dx with DM. MD, please indicate in note if patient will be dx with DM at this time. If so, please inform patient and nursing staff so patient can be educated.  NOTE: In reviewing chart, do not note a documented history of DM. However, noted patient seen PCP (initial visit) on 11/04/17 and noted to have hyperglycemia so A1C was checked and noted to be 7.7% on 11/04/17. Unclear if patient was dx with DM by PCP or not.  Attending MD may want to discuss with patient. If patient will be newly dx with DM, please inform patient and bedside nursing so patient can be educated while inpatient.  Thanks,  Barnie Alderman, RN, MSN, CDE Diabetes Coordinator Inpatient Diabetes Program [phone] (Team Pager from 8am to 5pm)

## 2018-01-13 NOTE — Evaluation (Signed)
Physical Therapy Evaluation Patient Details Name: Margaret [name] MRN: [id number] DOB: Feb 13, 1941 Today's Date: 01/13/2018   History of Present Illness  Patient is 77 y.o. female with a known history of coronary artery disease status post cardiac cath in December 2018 status post drug-eluting stent placement and right RCA stroke, Jerrye Bushy, anxiety/depression presented to ED from PCP with low potassium and shortness of breath along with chest tightness.  Clinical Impression  Patient A&Ox4 at start of session, states she had some chest pain, 5-6/10 in the middle of her chest that she states has been improved since admission. Patient reports using a quad cane for ambulation, lives with husband in single story home, independent with ADLs and IADLs, but has had SOB/fatigue since MI in December.  The patient was able to mobilize and sit EOB with supervision. PT noted HR ranging from 90s to low 100s while sitting, and monitor reporting atrial fibrillation. Per chart, patient, and family member, patient does not have history of atrial fibrillation. HR increased to 110-135 with afib, patient returned to supine position. Patient complained of minimal dizziness, nursing notified immediately. PT checked heart monitor leads, adjusted position of 1, HR mid 60s, resolvement of afib/ Patient able to sit EOB and ambulated in room with and without RW, with stable vitals and mild complaints of SOB/fatigue compared to baseline. Patient vitals stable and without complaint at end of session up in chair. The patient would benefit from further skilled PT to address deficits in endurance, strength, activity tolerance, balance and gait to maximize independence and mobility.     Follow Up Recommendations Outpatient PT    Equipment Recommendations  Rolling walker with 5" wheels    Recommendations for Other Services       Precautions / Restrictions Precautions Precautions: Fall Restrictions Weight Bearing Restrictions: No       Mobility  Bed Mobility Overal bed mobility: Needs Assistance Bed Mobility: Supine to Sit     Supine to sit: Supervision        Transfers Overall transfer level: Needs assistance Equipment used: None Transfers: Sit to/from Stand Sit to Stand: Supervision;Min guard            Ambulation/Gait Ambulation/Gait assistance: Min guard Gait Distance (Feet): 30 Feet Assistive device: Rolling walker (2 wheeled);None          Stairs            Wheelchair Mobility    Modified Rankin (Stroke Patients Only)       Balance Overall balance assessment: Mild deficits observed, not formally tested                                           Pertinent Vitals/Pain Pain Assessment: 0-10 Pain Score: 6  Pain Location: chest Pain Descriptors / Indicators: Other (Comment)(pulling) Pain Intervention(s): Limited activity within patient's tolerance;Monitored during session    [place] expects to be discharged to:: Private residence Living Arrangements: Spouse/significant other Available Help at Discharge: Family;Available 24 hours/day Type of Home: House Home Access: Stairs to enter Entrance Stairs-Rails: Can reach both Entrance Stairs-Number of Steps: 4 Home Layout: One level Home Equipment: Grab bars - tub/shower;Cane - quad      Prior Function Level of Independence: Independent with assistive device(s)         Comments: Patient reports using a quad cane after her heart attack in december. Able to bathe independently, husband  assists with cooking due to shortness of breath. Drives, grocery shops      Hand Dominance   Dominant Hand: Right    Extremity/Trunk Assessment   Upper Extremity Assessment Upper Extremity Assessment: Overall WFL for tasks assessed    Lower Extremity Assessment Lower Extremity Assessment: Generalized weakness;RLE deficits/detail;LLE deficits/detail RLE Deficits / Details: grossly 3+/5 LLE  Deficits / Details: grossly 3+/5       Communication   Communication: No difficulties  Cognition Arousal/Alertness: Awake/alert Behavior During Therapy: WFL for tasks assessed/performed Overall Cognitive Status: Within Functional Limits for tasks assessed                                        General Comments      Exercises     Assessment/Plan    PT Assessment Patient needs continued PT services  PT Problem List Decreased strength;Decreased activity tolerance;Decreased balance;Pain;Decreased mobility       PT Treatment Interventions DME instruction;Balance training;Gait training;Neuromuscular re-education;Stair training;Functional mobility training;Patient/family education;Therapeutic activities;Therapeutic exercise    PT Goals (Current goals can be found in the Care Plan section)  Acute Rehab PT Goals Patient Stated Goal: Patient would like to return to PLOF with optimized safety PT Goal Formulation: With patient Time For Goal Achievement: 01/27/18 Potential to Achieve Goals: Good    Frequency Min 2X/week   Barriers to discharge        Co-evaluation               AM-PAC PT "6 Clicks" Daily Activity  Outcome Measure Difficulty turning over in bed (including adjusting bedclothes, sheets and blankets)?: None Difficulty moving from lying on back to sitting on the side of the bed? : None Difficulty sitting down on and standing up from a chair with arms (e.g., wheelchair, bedside commode, etc,.)?: A Little Help needed moving to and from a bed to chair (including a wheelchair)?: A Little Help needed walking in hospital room?: A Little Help needed climbing 3-5 steps with a railing? : A Little 6 Click Score: 20    End of Session Equipment Utilized During Treatment: Gait belt Activity Tolerance: Patient limited by fatigue Patient left: with chair alarm set;in chair;with call bell/phone within reach Nurse Communication: Mobility status PT Visit  Diagnosis: Other abnormalities of gait and mobility (R26.89);Muscle weakness (generalized) (M62.81)    Time: 3354-5625 PT Time Calculation (min) (ACUTE ONLY): 32 min   Charges:   PT Evaluation $PT Eval Low Complexity: 1 Low PT Treatments $Therapeutic Activity: 8-22 mins       Lieutenant Diego PT, DPT 3:10 PM,01/13/18 [phone]

## 2018-01-13 NOTE — Progress Notes (Addendum)
[facility] [place] NAME: Margaret [name]    MR#:  [id number]  DATE OF BIRTH:  May 20, 1941  SUBJECTIVE:  CHIEF COMPLAINT:   Chief Complaint  Patient presents with  . Shortness of Breath  . Chest Pain   Patient has no chest pain or shortness of breath.  Bradycardia this morning but A. fib with RVR later. REVIEW OF SYSTEMS:  Review of Systems  Constitutional: Negative for chills, fever and malaise/fatigue.  HENT: Negative for sore throat.   Eyes: Negative for blurred vision and double vision.  Respiratory: Negative for cough, hemoptysis, shortness of breath, wheezing and stridor.   Cardiovascular: Negative for chest pain, palpitations, orthopnea and leg swelling.  Gastrointestinal: Negative for abdominal pain, blood in stool, diarrhea, melena, nausea and vomiting.  Genitourinary: Negative for dysuria, flank pain and hematuria.  Musculoskeletal: Negative for back pain and joint pain.  Neurological: Negative for dizziness, sensory change, focal weakness, seizures, loss of consciousness, weakness and headaches.  Endo/Heme/Allergies: Negative for polydipsia.  Psychiatric/Behavioral: Negative for depression. The patient is not nervous/anxious.     DRUG ALLERGIES:   Allergies  Allergen Reactions  . Erythromycin Nausea Only  . Other Rash    Micropore tape  . Tape Itching and Rash    Also "band-aids"   VITALS:  Blood pressure (!) 179/63, pulse (!) 57, temperature 98.6 F (37 C), temperature source Oral, resp. rate 18, height 5\' 8"  (1.727 m), weight 182 lb (82.6 kg), SpO2 100 %. PHYSICAL EXAMINATION:  Physical Exam  Constitutional: She is oriented to person, place, and time. She appears well-nourished.  HENT:  Head: Normocephalic.  Eyes: Conjunctivae and EOM are normal. No scleral icterus.  Neck: Normal range of motion. Neck supple. No JVD present. No tracheal deviation present.  Cardiovascular: Normal rate, regular rhythm and normal  heart sounds. Exam reveals no gallop.  No murmur heard. Pulmonary/Chest: Effort normal and breath sounds normal. No respiratory distress. She has no wheezes. She has no rales.  Abdominal: Soft. Bowel sounds are normal. She exhibits no distension. There is no tenderness. There is no rebound.  Musculoskeletal: Normal range of motion. She exhibits no edema or tenderness.  Neurological: She is alert and oriented to person, place, and time. No cranial nerve deficit.  Skin: No rash noted. No erythema.   LABORATORY PANEL:  Female CBC Recent Labs  Lab 01/12/18 1349  WBC 11.2*  HGB 10.7*  HCT 32.8*  PLT 260   ------------------------------------------------------------------------------------------------------------------ Chemistries  Recent Labs  Lab 01/12/18 1349 01/12/18 2055 01/13/18 0527  NA 139  --  145  K 2.6* 2.9* 3.7  CL 110  --  117*  CO2 22  --  26  GLUCOSE 222*  --  151*  BUN 13  --  11  CREATININE 1.21*  --  1.18*  CALCIUM 8.6*  --  8.3*  MG  --  2.8*  --   AST 145*  --   --   ALT 85*  --   --   ALKPHOS 118  --   --   BILITOT 0.8  --   --    RADIOLOGY:  No results found. ASSESSMENT AND PLAN:   Margaret [name]  is a 77 y.o. female with a known history of coronary artery disease status post cardiac cath in December 2018 status post drug-eluting stent placement and right RCA, Jerrye Bushy, anxiety/depression comes to the emergency room from primary care physician's office with low potassium and shortness of breath  along with chest tightness.   1. chest pain, shortness of breath, low heart rate with EKG showing sinus bradycardia -hold Coreg and metoprolol per Dr. Fletcher Anon.  2. Hypokalemia Improved with potassium supplement.  3. History of CAD status post inferior wall MI with cardiac cath and overlapping drug-eluting stent placed in mid and distal RCA in December 2018 discontinue the patient's   ASA and Brilinta and started Plavix.  4. Dyspnea on exertion ?etio -chest  x-ray within normal limits -patient's oxygen sets are stable  5. DM 2.  Hemoglobin A1c 7.8.  Diabetes coordinator consult.  Start sliding scale.  6. new onset Afib with RVR Per cardiology consult, amiodarone 200 mg bid in place of beta blocker therapy at this time given her prior issues on beta blockers. Discontinue the patient's   ASA and Brilinta, give Plavix 300 mg today followed by 75 mg tomorrow, start Eliquis 5 mg twice daily.  PT evaluation.  Outpatient PT. All the records are reviewed and case discussed with Care Management/Social Worker. Management plans discussed with the patient, her daughter and they are in agreement.  CODE STATUS: Full Code  TOTAL TIME TAKING CARE OF THIS PATIENT: 38 minutes.   More than 50% of the time was spent in counseling/coordination of care: YES  POSSIBLE D/C IN 1 DAYS, DEPENDING ON CLINICAL CONDITION.   Demetrios Loll M.D on 01/13/2018 at 5:08 PM  Between 7am to 6pm - Pager - [phone]  After 6pm go to [email or web address] - [facility]

## 2018-01-13 NOTE — Progress Notes (Addendum)
PT called this RN during session stating patient's telemetry box was reading Afib 70-110s. Patient does not have documented history of Afib. Called CCMD to verify and they stated patient had been in Afib with heart rate fluctuating up to 120 for about an hour (RN was never notified). Notified Christell Faith, PA and he assessed on unit monitor. Patient back to SR 70 at this time. Will continue to monitor.

## 2018-01-14 ENCOUNTER — Telehealth: Payer: Self-pay

## 2018-01-14 LAB — CBC
HEMATOCRIT: 30.1 % — AB (ref 35.0–47.0)
Hemoglobin: 9.8 g/dL — ABNORMAL LOW (ref 12.0–16.0)
MCH: 24.1 pg — AB (ref 26.0–34.0)
MCHC: 32.4 g/dL (ref 32.0–36.0)
MCV: 74.3 fL — ABNORMAL LOW (ref 80.0–100.0)
Platelets: 232 10*3/uL (ref 150–440)
RBC: 4.05 MIL/uL (ref 3.80–5.20)
RDW: 18.4 % — AB (ref 11.5–14.5)
WBC: 8 10*3/uL (ref 3.6–11.0)

## 2018-01-14 LAB — GLUCOSE, CAPILLARY
Glucose-Capillary: 110 mg/dL — ABNORMAL HIGH (ref 70–99)
Glucose-Capillary: 139 mg/dL — ABNORMAL HIGH (ref 70–99)

## 2018-01-14 LAB — BASIC METABOLIC PANEL
Anion gap: 4 — ABNORMAL LOW (ref 5–15)
BUN: 11 mg/dL (ref 8–23)
CHLORIDE: 114 mmol/L — AB (ref 98–111)
CO2: 26 mmol/L (ref 22–32)
CREATININE: 1.14 mg/dL — AB (ref 0.44–1.00)
Calcium: 8.5 mg/dL — ABNORMAL LOW (ref 8.9–10.3)
GFR calc Af Amer: 52 mL/min — ABNORMAL LOW (ref >60)
GFR calc non Af Amer: 45 mL/min — ABNORMAL LOW (ref >60)
Glucose, Bld: 111 mg/dL — ABNORMAL HIGH (ref 70–99)
Potassium: 3.6 mmol/L (ref 3.5–5.1)
Sodium: 144 mmol/L (ref 135–145)

## 2018-01-14 LAB — MAGNESIUM: MAGNESIUM: 2.2 mg/dL (ref 1.7–2.4)

## 2018-01-14 MED ORDER — LISINOPRIL 20 MG PO TABS: 20.0000 mg | ORAL_TABLET | Freq: Every day | ORAL | 0 refills | Status: DC

## 2018-01-14 MED ORDER — PREMIER PROTEIN SHAKE: 11.0000 [oz_av] | Freq: Two times a day (BID) | ORAL | 0 refills | Status: DC

## 2018-01-14 MED ORDER — ADULT MULTIVITAMIN W/MINERALS CH: 1.0000 | ORAL_TABLET | Freq: Every day | ORAL | Status: DC

## 2018-01-14 MED ORDER — CLOPIDOGREL BISULFATE 75 MG PO TABS: 75.0000 mg | ORAL_TABLET | Freq: Every day | ORAL | 0 refills | Status: DC

## 2018-01-14 MED ORDER — LISINOPRIL 20 MG PO TABS
20.0000 mg | ORAL_TABLET | Freq: Every day | ORAL | Status: DC
  Administered 2018-01-14: 20 mg via ORAL
  Filled 2018-01-14: qty 1

## 2018-01-14 MED ORDER — APIXABAN 5 MG PO TABS: 5.0000 mg | ORAL_TABLET | Freq: Two times a day (BID) | ORAL | 0 refills | Status: DC

## 2018-01-14 MED ORDER — AMIODARONE HCL 200 MG PO TABS: 200.0000 mg | ORAL_TABLET | Freq: Two times a day (BID) | ORAL | 0 refills | Status: DC

## 2018-01-14 MED ORDER — POTASSIUM CHLORIDE CRYS ER 20 MEQ PO TBCR
20.0000 meq | EXTENDED_RELEASE_TABLET | Freq: Two times a day (BID) | ORAL | Status: DC
  Administered 2018-01-14: 20 meq via ORAL
  Filled 2018-01-14: qty 1

## 2018-01-14 MED ORDER — PREMIER PROTEIN SHAKE: 11.0000 [oz_av] | Freq: Two times a day (BID) | ORAL | Status: DC

## 2018-01-14 MED ORDER — LIVING WELL WITH DIABETES BOOK
Freq: Once | Status: AC
  Administered 2018-01-14: 02:00:00
  Filled 2018-01-14: qty 1

## 2018-01-14 MED ORDER — ADULT MULTIVITAMIN W/MINERALS CH: 1.0000 | ORAL_TABLET | Freq: Every day | ORAL | 1 refills | Status: AC

## 2018-01-14 MED ORDER — LIVING WELL WITH DIABETES BOOK
Freq: Once | Status: DC
  Filled 2018-01-14: qty 1

## 2018-01-14 MED ORDER — AMIODARONE HCL 200 MG PO TABS
200.0000 mg | ORAL_TABLET | Freq: Two times a day (BID) | ORAL | Status: DC
  Filled 2018-01-14: qty 1

## 2018-01-14 NOTE — Progress Notes (Signed)
eliquis coupon provided to pt. Educated on apixaban and all new medications/changes  Ravon Mcilhenny D Haru Anspaugh, Pharm.D, BCPS Clinical Pharmacist

## 2018-01-14 NOTE — Discharge Instructions (Signed)
Heart healthy and ADA diet. Outpatient PT.  Information on my medicine - ELIQUIS (apixaban)  This medication education was reviewed with me or my healthcare representative as part of my discharge preparation.  The pharmacist that spoke with me during my hospital stay was:  Ramond Dial, [facility]  Why was Eliquis prescribed for you? Eliquis was prescribed for you to reduce the risk of a blood clot forming that can cause a stroke if you have a medical condition called atrial fibrillation (a type of irregular heartbeat).  What do You need to know about Eliquis ? Take your Eliquis TWICE DAILY - one tablet in the morning and one tablet in the evening with or without food. If you have difficulty swallowing the tablet whole please discuss with your pharmacist how to take the medication safely.  Take Eliquis exactly as prescribed by your doctor and DO NOT stop taking Eliquis without talking to the doctor who prescribed the medication.  Stopping may increase your risk of developing a stroke.  Refill your prescription before you run out.  After discharge, you should have regular check-up appointments with your healthcare provider that is prescribing your Eliquis.  In the future your dose may need to be changed if your kidney function or weight changes by a significant amount or as you get older.  What do you do if you miss a dose? If you miss a dose, take it as soon as you remember on the same day and resume taking twice daily.  Do not take more than one dose of ELIQUIS at the same time to make up a missed dose.  Important Safety Information A possible side effect of Eliquis is bleeding. You should call your healthcare provider right away if you experience any of the following: ? Bleeding from an injury or your nose that does not stop. ? Unusual colored urine (red or dark brown) or unusual colored stools (red or black). ? Unusual bruising for unknown reasons. ? A serious fall or if you hit  your head (even if there is no bleeding).  Some medicines may interact with Eliquis and might increase your risk of bleeding or clotting while on Eliquis. To help avoid this, consult your healthcare provider or pharmacist prior to using any new prescription or non-prescription medications, including herbals, vitamins, non-steroidal anti-inflammatory drugs (NSAIDs) and supplements.  This website has more information on Eliquis (apixaban): http://www.eliquis.com/eliquis/home

## 2018-01-14 NOTE — Progress Notes (Addendum)
IV and tele removed from patient. Discharge instructions given to patient along with hard copy prescriptions. Verbalized understanding. No distress at this time. Husband at bedside will transport patient home.

## 2018-01-14 NOTE — Telephone Encounter (Signed)
Patient currently admitted at this time. 

## 2018-01-14 NOTE — Progress Notes (Addendum)
Inpatient Diabetes Program Recommendations  AACE/ADA: New Consensus Statement on Inpatient Glycemic Control (2019)  Target Ranges:  Prepandial:   less than 140 mg/dL      Peak postprandial:   less than 180 mg/dL (1-2 hours)      Critically ill patients:  140 - 180 mg/dL   Results for Margaret [name], Margaret [name] (MRN [id number]) as of 01/13/2018 12:03  Ref. Range 01/12/2018 13:49 01/12/2018 20:55 01/13/2018 05:27  Glucose Latest Ref Range: 70 - 99 mg/dL 222 (H)  151 (H)  Hemoglobin A1C Latest Ref Range: 4.8 - 5.6 %  7.4 (H)    Review of Glycemic Control  Diabetes history: No Outpatient Diabetes medications: NA Current orders for Inpatient glycemic control: Novolog 0-9 units TID with meals, Novolog 0-5 units QHS  Inpatient Diabetes Program Recommendations: HgbA1C: A1C 7.4% on 01/12/18 and patient is being newly dx with DM.   NOTE: In reviewing chart, do not note a documented history of DM. However, noted patient seen PCP (initial visit) on 11/04/17 and noted to have hyperglycemia so A1C was checked and noted to be 7.7% on 11/04/17. Unclear if patient was dx with DM by PCP or not.  Attending MD is diagnosing with new onset DM. Ordered Living Well with DM book and RD consult for diet education. Will plan to talk with patient today regarding new DM dx.   Addendum 01/14/18@11 :35-Spoke with patient and her daughter [name]) about new diabetes diagnosis. Patient reports that she has never been dx with DM. However, before she had her gastric bypass she was told she had pre-DM.  Patient's daughter [name] is a nurse so she will be a good resource for DM control.  Informed patient of A1C 7.7% on 11/04/17 that was ordered by PCP.  Discussed A1C results (7.4% ib 01/12/18) and explained what an A1C is and informed patient that his current A1C indicates an average glucose of 166 mg/dl over the past 2-3 months. Discussed basic pathophysiology of DM Type 2, basic home care, importance of checking CBGs and maintaining good CBG control to  prevent long-term and short-term complications. Reviewed glucose and A1C. Reviewed signs and symptoms of hyperglycemia and hypoglycemia along with treatment for both. Discussed impact of nutrition, exercise, stress, sickness, and medications on diabetes control. Reviewed Living Well with diabetes booklet and encouraged patient to read through entire book. Informed patient that it would be recommended for her to try to make lifestyle (diet and exercise) modifications first, monitor glucose at home at least once a day, and follow up with PCP regarding DM control.   Asked patient to check her glucose at least 1 time per day. Asked patient that if glucose is consistently elevated to reach out to her PCP.  Will order outpatient DM education and ask MD to provide Rx for glucometer and testing supplies.  Patient verbalized understanding of information discussed and she states that she has no further questions at this time related to diabetes.    Thanks,  Barnie Alderman, RN, MSN, CDE Diabetes Coordinator Inpatient Diabetes Program [phone] (Team Pager from 8am to 5pm)

## 2018-01-14 NOTE — Progress Notes (Signed)
Initial Nutrition Assessment  DOCUMENTATION CODES:   Not applicable  INTERVENTION:   Premier Protein BID, each supplement provides 160 kcal and 30 grams of protein.   MVI daily  Carbohydrate controlled/ low sodium education   NUTRITION DIAGNOSIS:   Increased nutrient needs related to altered GI function(pt with h/o Roux-en-y) as evidenced by estimated needs.  GOAL:   Patient will meet greater than or equal to 90% of their needs  MONITOR:   PO intake, Supplement acceptance, Labs, Weight trends, Skin, I & O's  REASON FOR ASSESSMENT:   Consult Diet education  ASSESSMENT:   77 y.o. female with history of CAD s/p PCI to the RCA, HTN, HLD, right PCA stroke in 10/2015 followed by [facility], chronic dyspnea, anxiety and depression, morbid obesity s/p gastric bypass, and GERD who is being seen today for the evaluation of chest tightness in setting of bradycardia    Met with pt in room today. Pt reports good appetite and oral intake today and pta. Pt currently eating 100% of meals. Per chart, pt is weight stable. Pt with h/o Roux-en-y gastric bypass surgery ~2008. Pt reports that she does not take any vitamins at home and has not had any vitamin labs checked that she is aware of. Pt at high risk for nutrient deficiencies r/t her gastric surgery. Recommend check thiamine, B12, folate, iron, calcium, zinc, copper, and vitamins D, A, E, & K yearly. Discussed with pt today about the importance of taking her vitamins indefinitely; provided pt with handout on recommend vitamins to take. Pt is noted to have microcytic anemia. Pt also provided with carbohydrate controlled/ low sodium diet education today.     Medications reviewed and include: plavix, insulin, KCl, protonix  Labs reviewed: Hgb 9.8(L), Hct 30.1(L), MCV 74.3(L), MCH 24.1(L) Cbgs- 201, 110 x 24 hrs AIC 7.4(H)  NUTRITION - FOCUSED PHYSICAL EXAM:    Most Recent Value  Orbital Region  No depletion  Upper Arm Region  No  depletion  Thoracic and Lumbar Region  No depletion  Buccal Region  No depletion  Temple Region  No depletion  Clavicle Bone Region  No depletion  Clavicle and Acromion Bone Region  No depletion  Scapular Bone Region  No depletion  Dorsal Hand  No depletion  Patellar Region  No depletion  Anterior Thigh Region  No depletion  Posterior Calf Region  No depletion  Edema (RD Assessment)  None  Hair  Reviewed  Eyes  Reviewed  Mouth  Reviewed  Skin  Reviewed  Nails  Reviewed     Diet Order:   Diet Order           Diet - low sodium heart healthy        Diet heart healthy/carb modified Room service appropriate? Yes; Fluid consistency: Thin  Diet effective now         EDUCATION NEEDS:   Education needs have been addressed  Skin:  Skin Assessment: Reviewed RN Assessment(Ecchymosis )  Last BM:  7/25  Height:   Ht Readings from Last 1 Encounters:  01/12/18 '5\' 8"'$  (1.727 m)    Weight:   Wt Readings from Last 1 Encounters:  01/14/18 134 lb (60.8 kg)    Ideal Body Weight:  63.6 kg  BMI:  Body mass index is 20.37 kg/m.  Estimated Nutritional Needs:   Kcal:  1600-1800kcal/day   Protein:  67-84g/day   Fluid:  >1.6L/day   Koleen Distance MS, RD, LDN Pager #[phone] Office#- [phone] After Hours Pager:  [phone]

## 2018-01-14 NOTE — Discharge Summary (Signed)
[facility] NAME: Margaret [name]    MR#:  [id number]  DATE OF BIRTH:  Jul 06, 1940  DATE OF ADMISSION:  01/12/2018   ADMITTING PHYSICIAN: Fritzi Mandes, MD  DATE OF DISCHARGE: 01/14/2018 12:30 PM  PRIMARY CARE PHYSICIAN: Baxter Hire, MD   ADMISSION DIAGNOSIS:  Shortness of breath [R06.02] Hypokalemia [E87.6] Chest pain, unspecified type [R07.9] DISCHARGE DIAGNOSIS:  Principal Problem:   Bradycardia  SECONDARY DIAGNOSIS:   Past Medical History:  Diagnosis Date  . Anxiety and depression   . CAD (coronary artery disease)   . Cellulitis of right leg 2005  . Colon polyp   . Dyspnea on exertion   . GERD (gastroesophageal reflux disease)   . Hyperlipidemia   . Hypertension   . Morbid obesity ([place])   . Presence of stent in right coronary artery 02/08/12   2.5x18 Xience distal RCA  . Right shoulder pain   . Stroke ([place])   . Vaginal prolapse    HOSPITAL COURSE:   [name] a13 y.o.femalewith a known history of coronary artery disease status post cardiac cath in December 2018 status post drug-eluting stent placement and right RCA, Margaret [name], anxiety/depression comes to the emergency room from primary care physician's office with low potassium and shortness of breath along with chest tightness.  1.chest pain, shortness of breath, low heart rate with EKG showing sinus bradycardia -hold Coreg and metoprolol per Dr. Fletcher Anon.  2.Hypokalemia Improved with potassium supplement.  3.History of CAD status post inferior wall MI with cardiac cath and overlapping drug-eluting stent placed in mid and distal RCA in December 2018 discontinued the patient's ASA and Brilinta and started Plavix.  4.Dyspnea on exertion ?etio -chest x-ray within normal limits -patient's oxygen sets are stable  5. DM 2.  Hemoglobin A1c 7.4.  ADA diet.  on sliding scale.  Follow-up PCP.  6. new onset Afib with RVR Per cardiology consult,  amiodarone 200 mg bid in place of beta blocker therapy at this time given her prior issues on beta blockers.  Amiodarone was discontinued since back to sinus rhythm.  Discontinued the patient's ASA and Brilinta, given Plavix 300 mg yesterday followed by 75 mg today. started Eliquis 5 mg twice daily.  PT evaluation.  Outpatient PT. DISCHARGE CONDITIONS:  Stable, discharged to home today. CONSULTS OBTAINED:  Treatment Team:  Nelva Bush, MD Constance Haw, MD Demetrios Loll, MD DRUG ALLERGIES:   Allergies  Allergen Reactions  . Erythromycin Nausea Only  . Other Rash    Micropore tape  . Tape Itching and Rash    Also "band-aids"   DISCHARGE MEDICATIONS:   Allergies as of 01/14/2018      Reactions   Erythromycin Nausea Only   Other Rash   Micropore tape   Tape Itching, Rash   Also "band-aids"      Medication List    STOP taking these medications   aspirin EC 81 MG tablet   carvedilol 6.25 MG tablet Commonly known as:  COREG   losartan 50 MG tablet Commonly known as:  COZAAR   metoprolol tartrate 25 MG tablet Commonly known as:  LOPRESSOR   ticagrelor 90 MG Tabs tablet Commonly known as:  BRILINTA     TAKE these medications   amLODipine 5 MG tablet Commonly known as:  NORVASC Take 1 tablet (5 mg total) by mouth daily.   apixaban 5 MG Tabs tablet Commonly known as:  ELIQUIS Take 1 tablet (5 mg total) by mouth  2 (two) times daily.   clopidogrel 75 MG tablet Commonly known as:  PLAVIX Take 1 tablet (75 mg total) by mouth daily. Start taking on:  01/15/2018   docusate sodium 100 MG capsule Commonly known as:  COLACE Take 100 mg by mouth daily as needed for mild constipation.   ezetimibe 10 MG tablet Commonly known as:  ZETIA Take 1 tablet (10 mg total) by mouth daily.   lisinopril 20 MG tablet Commonly known as:  PRINIVIL,ZESTRIL Take 1 tablet (20 mg total) by mouth daily.   multivitamin with minerals Tabs tablet Take 1 tablet by mouth  daily.   nitroGLYCERIN 0.4 MG SL tablet Commonly known as:  NITROSTAT Place 1 tablet (0.4 mg total) under the tongue every 5 (five) minutes as needed for chest pain.   omeprazole 20 MG capsule Commonly known as:  PRILOSEC Take 1 capsule by mouth daily.   protein supplement shake Liqd Commonly known as:  PREMIER PROTEIN Take 325 mLs (11 oz total) by mouth 2 (two) times daily between meals.   rosuvastatin 40 MG tablet Commonly known as:  CRESTOR Take 1 tablet (40 mg total) by mouth daily.   sertraline 100 MG tablet Commonly known as:  ZOLOFT Take 100 mg by mouth daily.        DISCHARGE INSTRUCTIONS:  See AVS.  If you experience worsening of your admission symptoms, develop shortness of breath, life threatening emergency, suicidal or homicidal thoughts you must seek medical attention immediately by calling 911 or calling your MD immediately  if symptoms less severe.  You Must read complete instructions/literature along with all the possible adverse reactions/side effects for all the Medicines you take and that have been prescribed to you. Take any new Medicines after you have completely understood and accpet all the possible adverse reactions/side effects.   Please note  You were cared for by a hospitalist during your hospital stay. If you have any questions about your discharge medications or the care you received while you were in the hospital after you are discharged, you can call the unit and asked to speak with the hospitalist on call if the hospitalist that took care of you is not available. Once you are discharged, your primary care physician will handle any further medical issues. Please note that NO REFILLS for any discharge medications will be authorized once you are discharged, as it is imperative that you return to your primary care physician (or establish a relationship with a primary care physician if you do not have one) for your aftercare needs so that they can  reassess your need for medications and monitor your lab values.    On the day of Discharge:  VITAL SIGNS:  Blood pressure (!) 148/72, pulse (!) 59, temperature 97.6 F (36.4 C), temperature source Oral, resp. rate 17, height 5\' 8"  (1.727 m), weight 134 lb (60.8 kg), SpO2 99 %. PHYSICAL EXAMINATION:  GENERAL:  77 y.o.-year-old patient lying in the bed with no acute distress.  EYES: Pupils equal, round, reactive to light and accommodation. No scleral icterus. Extraocular muscles intact.  HEENT: Head atraumatic, normocephalic. Oropharynx and nasopharynx clear.  NECK:  Supple, no jugular venous distention. No thyroid enlargement, no tenderness.  LUNGS: Normal breath sounds bilaterally, no wheezing, rales,rhonchi or crepitation. No use of accessory muscles of respiration.  CARDIOVASCULAR: S1, S2 normal. No murmurs, rubs, or gallops.  ABDOMEN: Soft, non-tender, non-distended. Bowel sounds present. No organomegaly or mass.  EXTREMITIES: No pedal edema, cyanosis, or clubbing.  NEUROLOGIC: Cranial  nerves II through XII are intact. Muscle strength 5/5 in all extremities. Sensation intact. Gait not checked.  PSYCHIATRIC: The patient is alert and oriented x 3.  SKIN: No obvious rash, lesion, or ulcer.  DATA REVIEW:   CBC Recent Labs  Lab 01/14/18 0434  WBC 8.0  HGB 9.8*  HCT 30.1*  PLT 232    Chemistries  Recent Labs  Lab 01/12/18 1349  01/14/18 0434  NA 139   < > 144  K 2.6*   < > 3.6  CL 110   < > 114*  CO2 22   < > 26  GLUCOSE 222*   < > 111*  BUN 13   < > 11  CREATININE 1.21*   < > 1.14*  CALCIUM 8.6*   < > 8.5*  MG  --    < > 2.2  AST 145*  --   --   ALT 85*  --   --   ALKPHOS 118  --   --   BILITOT 0.8  --   --    < > = values in this interval not displayed.     Microbiology Results  Results for orders placed or performed during the hospital encounter of 06/14/17  MRSA PCR Screening     Status: None   Collection Time: 06/14/17  5:14 PM  Result Value Ref Range  Status   MRSA by PCR NEGATIVE NEGATIVE Final    Comment:        The GeneXpert MRSA Assay (FDA approved for NASAL specimens only), is one component of a comprehensive MRSA colonization surveillance program. It is not intended to diagnose MRSA infection nor to guide or monitor treatment for MRSA infections. Performed at [facility], [address], [place], [place] [address]     RADIOLOGY:  No results found.   Management plans discussed with the patient, her daughter and they are in agreement.  CODE STATUS: Full Code   TOTAL TIME TAKING CARE OF THIS PATIENT: 42 minutes.    Demetrios Loll M.D on 01/14/2018 at 5:14 PM  Between 7am to 6pm - Pager - [phone]  After 6pm go to [email or web address] - Proofreader  [facility]  Office  [phone]  CC: Primary care physician; Baxter Hire, MD   Note: This dictation was prepared with Dragon dictation along with smaller phrase technology. Any transcriptional errors that result from this process are unintentional.

## 2018-01-14 NOTE — Progress Notes (Addendum)
Progress Note  Patient Name: Margaret [name] Date of Encounter: 01/14/2018  Primary Cardiologist: [place]  Subjective   Pt reports she continues to feel chest tightness, unimproved since admission. She adds that standing and walking around during yesterday's PT made the chest tightness worse, at which time she was noted to be in Afib with RVR and rates into the 120s. She reports that this then improved with rest. Per telemetry, she is back in sinus bradycardia today.   She expresses concern over her blood pressure s/p stopping both beta blockers. Specifically, she reports concern that, since her stroke, her left eye's vision is "out of focus" whenever her blood pressure is elevated. She states that she has had high blood pressure and trouble with her vision since admission but is not currently having trouble with it. Ordered ACE-I per below in A/P. Will monitor HR and BP with ambulation and prior to discharge. She requests discount cards for her newly prescribed Eliquis. Consulted care management. Also requested information on lipids, found via care everywhere and pt updated.  Of note, patient spontaneously converted from Afib with RVR and rates in 120s back to sinus brady yesterday 7/25. This conversion happened prior to the administration of amiodarone - discontinued amiodarone order.  No amiodarone po recommended at discharge as of this time.  Inpatient Medications    Scheduled Meds: . amLODipine  5 mg Oral Daily  . apixaban  5 mg Oral BID  . clopidogrel  75 mg Oral Daily  . ezetimibe  10 mg Oral QPM  . insulin aspart  0-5 Units Subcutaneous QHS  . insulin aspart  0-9 Units Subcutaneous TID WC  . pantoprazole  40 mg Oral Daily  . rosuvastatin  40 mg Oral Daily  . sertraline  100 mg Oral Daily  . sodium chloride flush  3 mL Intravenous Q12H   Continuous Infusions:  PRN Meds: acetaminophen **OR** acetaminophen, nitroGLYCERIN, ondansetron **OR** ondansetron (ZOFRAN) IV   Vital  Signs    Vitals:   01/13/18 1810 01/13/18 2018 01/14/18 0428 01/14/18 0444  BP: (!) 153/61 (!) 161/58  (!) 158/71  Pulse: 64 (!) 55  (!) 54  Resp:  18  18  Temp: 98.7 F (37.1 C) 98.1 F (36.7 C)  98.7 F (37.1 C)  TempSrc: Oral Oral  Oral  SpO2: 100% 99%  100%  Weight:   134 lb (60.8 kg)   Height:        Intake/Output Summary (Last 24 hours) at 01/14/2018 0743 Last data filed at 01/14/2018 0445 Gross per 24 hour  Intake -  Output 1350 ml  Net -1350 ml   Filed Weights   01/12/18 1342 01/14/18 0428  Weight: 182 lb (82.6 kg) 134 lb (60.8 kg)    Telemetry    SR, Bradycardic, HR 55-65 - Personally Reviewed  ECG    n/a - Personally Reviewed  Physical Exam   GEN: No acute distress.  Eating pancakes.  Neck: No JVD. Cardiac: Bradycardic, I/VI systolic murmur at the apex, no rubs, or gallops.  Respiratory: Clear to auscultation bilaterally.  GI: Soft, nontender, non-distended.   MS: Trace edema; No deformity. Neuro:  Alert and oriented x 3; Nonfocal.  Psych: Normal affect.  Labs    Chemistry Recent Labs  Lab 01/12/18 1349 01/12/18 2055 01/13/18 0527 01/14/18 0434  NA 139  --  145 144  K 2.6* 2.9* 3.7 3.6  CL 110  --  117* 114*  CO2 22  --  26 26  GLUCOSE 222*  --  151* 111*  BUN 13  --  11 11  CREATININE 1.21*  --  1.18* 1.14*  CALCIUM 8.6*  --  8.3* 8.5*  PROT 6.1*  --   --   --   ALBUMIN 3.2*  --   --   --   AST 145*  --   --   --   ALT 85*  --   --   --   ALKPHOS 118  --   --   --   BILITOT 0.8  --   --   --   GFRNONAA 41*  --  43* 45*  GFRAA 48*  --  49* 52*  ANIONGAP 7  --  2* 4*     Hematology Recent Labs  Lab 01/12/18 1349 01/14/18 0434  WBC 11.2* 8.0  RBC 4.46 4.05  HGB 10.7* 9.8*  HCT 32.8* 30.1*  MCV 73.4* 74.3*  MCH 24.0* 24.1*  MCHC 32.7 32.4  RDW 17.4* 18.4*  PLT 260 232    Cardiac Enzymes Recent Labs  Lab 01/12/18 1349 01/12/18 1800 01/12/18 2323 01/13/18 0527  TROPONINI 0.03* 0.03* 0.03* 0.03*   No results for  input(s): TROPIPOC in the last 168 hours.   BNP Recent Labs  Lab 01/12/18 1349  BNP 223.0*     DDimer No results for input(s): DDIMER in the last 168 hours.   Radiology    Dg Chest 2 View  Result Date: 01/12/2018 IMPRESSION: No acute abnormality.  Chronic cardiomegaly. Electronically Signed   By: Lorriane Shire M.D.   On: 01/12/2018 14:12    Cardiac Studies   LHC 05/2017: Conclusion     Mid RCA lesion is 80% stenosed.  Dist RCA lesion is 100% stenosed.  There is mild left ventricular systolic dysfunction.  LV end diastolic pressure is mildly elevated.  Ost Cx to Prox Cx lesion is 40% stenosed.  Prox Cx to Mid Cx lesion is 60% stenosed.  Ost 2nd Mrg to 2nd Mrg lesion is 30% stenosed.  Prox LAD lesion is 70% stenosed.  Ost 1st Diag to 1st Diag lesion is 90% stenosed.  A drug-eluting stent was successfully placed using a STENT SIERRA 2.50 X 38 MM.  Post intervention, there is a 0% residual stenosis.  Post intervention, there is a 0% residual stenosis.  A stent was successfully placed.  1. Severe one-vessel coronary artery disease with very late stent thrombosis affecting the distal right coronary artery. There is also borderline significant proximal to mid LAD stenosis at the origin of a diagonal branch and moderate left circumflex disease. The coronary arteries are overall moderately calcified. 2. Mildly reduced LV systolic function with an EF of 40% and inferior wall hypokinesis. Mildly elevated left ventricular end-diastolic pressure. 3. Successful complex angioplasty and 2 overlapped drug-eluting stent placement to the right coronary artery with extensive balloon angioplasty and aspiration thrombectomy.  Recommendations: I am going to switch from Plavix to Brilinta given very late stent thrombosis. Continue this for at least one year and preferably longer. Aggressive treatment of risk factors. LAD disease can likely be treated medically. However,  if she has residual anginal symptoms, stress testing or pressure wire evaluation can be done to the LAD. This was a difficult and prolonged procedure due to large thrombus, calcifications and tortuosity. The patient will be given precautions for exceeding radiation limits.     Echo 06/15/2017: Study Conclusions  - Left ventricle: The cavity size was normal. There was mild concentric hypertrophy. Systolic  function was normal. The estimated ejection fraction was in the range of 55% to 60%. Mild hypokinesis of the inferior myocardium. Features are consistent with a pseudonormal left ventricular filling pattern, with concomitant abnormal relaxation and increased filling pressure (grade 2 diastolic dysfunction). - Aortic valve: There was trivial regurgitation. - Mitral valve: There was mild regurgitation. - Left atrium: The atrium was moderately dilated. - Right atrium: The atrium was mildly dilated. - Pericardium, extracardiac: A trivial pericardial effusion was identified posterior to the heart.  Patient Profile     77 y.o. female with history of CAD s/p PCI to the RCA, HTN, HLD, right PCA stroke in 10/2015 followed by [facility], chronic dyspnea, anxiety and depression, morbid obesity s/p gastric bypass, and GERD who is being seen today for the evaluation of chest tightness in setting of bradycardia on two beta blockers, now discontinued.  Assessment & Plan    1. Elevated troponin/chest tightness/CAD: -Minimally elevated and flat trending at 0.03 x5 (7/24-7/25). This is not consistent with ACS. No indication for heparin gtt at this time. Possibly supply demand ischemia in the setting of known CAD as above with intial dehydration d/t diarrhea, AKI, and possibly spasm with hypokalemia  -7/25 - Afib with RVR and rates into the 120sm as noted below in #2. Spontaneously converted back to sinus brady prior to administration of amiodarone yesterday 7/25.  -Started on  Eliquis, Plavix. Discontinued ASA, Brilinta therapy. See #2 below. -No plans for inpatient ischemic evaluation   2. New onset Afib with RVR - Onset yesterday 7/25 with rates into 120s following standing during PT.  - Spontaneously converted back to sinus brady prior to administartion of amiodarone on 7/25.  No need for antiarrhythmic therapy at this time. - CHADS2VASc of at least 8 (HTN, age x2, DM, stroke x2, vascular disease, female). - Multiple layers of stents in the RCA per cardiac studies above. - Discontinued ASA and Brilinta therapy  - Started Plavix 300mg  loading dose (x1, 7/24 PM) followed by 75mg  qd (7/26).  - Started Eliquis 5mg  BID -pt does not meet reduced dosing criteria. Care management consulted for rx discount card.  3. Severe hypokalemia: -Still hypokalemic as of 7/26 - ordered 2 doses of KCl 20mg  d/t K+ 3.6  -Recommend repletion to goal > 4.0 -Possibly in the setting of her recent diarrhea -Not currently on a diuretic (was previously on HCTZ)    4. Bradycardia: -Continue to hold both Coreg and Lopressor for now. Pt bradycardic at admission on both BB.  -Currently bradycardic but yesterday 7/25 was in new onset Afib with RVR as above. If pt continues to have difficulty in managing heart rates with both tachycardia and bradycardia, she may consider evaluation by EP for tachy brady syndrome. -TSH normal  5. HTN -Pt reports that since her stroke she experiences L eye vision blurriness with high Bps -Coreg and Lopressor remain on hold for now, as above, d/t bradycardic rates on admission -BP 150-179/47-72.  -Renal function / hypokalemia improving since admission - in setting of newly diagnosed DM2, ordered lisinopril 20mg  to control BP. Continue to monitor. - Pt will need to ambulate to assess HR and BP prior to discharge.  4. Fatigue: -Unimproved. Likely in the setting of taking both Coreg and Lopressor, dehydration in the setting of diarrhea, physical deconditioning,  anemia, and newly diagnosed DM2.   5. Anemia: -Hgb 7/24 10.7  9.8 7/26. Continue to monitor  6. AKI: -Improving Cr 1.21  1.14 -Hypokalemic with K+ 3.6 and repleting  to 4.0 -Likely ATN from dehydration. Continue gentle IV fluids -Started on lisinopril 20mg  po qd in setting of HTN and newly diagnosed DM2.  7. Leukocytosis, resolved: -Possibly inflammatory, now resolved. -Per IM  8. New onset DM2: -A1c 7.4 -Per IM  9. HLD: -Being evaluated for possible PCSK-9 inhibitor as an outpatient. Goal LDL < 70 -LDL of 129 from 09/2017. LDL 40  7/24. Pt updated on labs. -Remains on Lipitor 80 mg qd, Zetia 10 mg qd (pt advised to start Crestor 6/6 in place of Lipitor following completion of her remaining Lipitor supply) -LFT normal  10. Medication reconciliation: -Pharmacy has been consulted for medication reconciliation given numerous duplicate medications noted in outpatient note to reconcile medications prior to discharge   For questions or updates, please contact [place] Please consult [email or web address] for contact info under Cardiology/STEMI.    Signed, Arvil Chaco, PA-C 01/14/18 10:00 AM [phone] pager

## 2018-01-14 NOTE — Telephone Encounter (Signed)
TCM....  Patient is being discharged   They saw R. Dunn  They are scheduled to see R. Dunn on 8/6 (primary cardio Peter Martinique, MD at [facility], hospital states to schedule follow up with both)  They were seen for elevated troponin/ chest tightness/ CAD  They need to be seen within 1 week  Pt added to wait list   Please call

## 2018-01-14 NOTE — Care Management Important Message (Signed)
Copy of signed IM left with patient in room.  

## 2018-01-14 NOTE — Plan of Care (Signed)
Nutrition Education Note  RD consulted for nutrition education regarding DM education   RD provided "Low Sodium Nutrition Therapy" handout from the Academy of [employer]. Reviewed patient's dietary recall. Provided examples on ways to decrease sodium intake in diet. Discouraged intake of processed foods and use of salt shaker. Encouraged fresh fruits and vegetables as well as whole grain sources of carbohydrates to maximize fiber intake.   RD discussed why it is important for patient to adhere to diet recommendations, and emphasized the role of fluids, foods to avoid, and importance of weighing self daily.   RD also provided "Nutrition with Type II Diabetes" handout from the Academy of [employer] and [employer]etics. Discussed different food groups and their effects on blood sugar, emphasizing carbohydrate-containing foods. Provided list of carbohydrates and recommended serving sizes of common foods.  Discussed importance of controlled and consistent carbohydrate intake throughout the day. Provided examples of ways to balance meals/snacks and encouraged intake of high-fiber, whole grain complex carbohydrates.   Teach back method used.  Expect good compliance.  Body mass index is 34.18 kg/m. Pt meets criteria for obesity based on current BMI.  Current diet order is HH, patient is consuming approximately 100% of meals at this time. Labs and medications reviewed. No further nutrition interventions warranted at this time. RD contact information provided. If additional nutrition issues arise, please re-consult RD.   Koleen Distance MS, RD, LDN Pager #- [phone] Office#- [phone] After Hours Pager: [phone]

## 2018-01-17 NOTE — Telephone Encounter (Signed)
Patient contacted regarding discharge from [facility] on 01/14/18.   Patient understands to follow up with provider ? On 01/25/18 at 1:30pm at [facility]  Patient understands discharge instructions? Yes  Patient understands medications and regiment? Yes  Patient understands to bring all medications to this visit? Yes

## 2018-01-17 NOTE — Telephone Encounter (Signed)
I left a message for the patient to call. 

## 2018-01-19 DIAGNOSIS — E876 Hypokalemia: Secondary | ICD-10-CM | POA: Diagnosis not present

## 2018-01-19 DIAGNOSIS — R001 Bradycardia, unspecified: Secondary | ICD-10-CM | POA: Diagnosis not present

## 2018-01-19 DIAGNOSIS — I959 Hypotension, unspecified: Secondary | ICD-10-CM | POA: Diagnosis not present

## 2018-01-19 DIAGNOSIS — Z09 Encounter for follow-up examination after completed treatment for conditions other than malignant neoplasm: Secondary | ICD-10-CM | POA: Diagnosis not present

## 2018-01-19 DIAGNOSIS — I4891 Unspecified atrial fibrillation: Secondary | ICD-10-CM | POA: Diagnosis not present

## 2018-01-20 ENCOUNTER — Other Ambulatory Visit: Payer: Self-pay

## 2018-01-20 NOTE — Patient Outreach (Signed)
[facility]) Care Management  01/20/2018  Margaret [name] 06-27-1940 [id number]     EMMI-General Discharge RED ON EMMI ALERT Day # 4 Date: 01/19/18 Red Alert Reason: " Sad/hopeless, anxious, empty? Yes"   Outreach attempt # 1 to patient. Spoke with patient who reported she was still asleep. She voices that "everything is going okay." Reviewed and addressed red alert and patient denies any issues and reported again "eveything is going okay-thanks so much" and hung up the phone. Patient has completed post discharge automated calls.        Plan: RN CM will close case at this time.    Enzo Montgomery, RN,BSN,CCM [place] [facility] Telephonic Care Management Coordinator Direct Phone: [phone] Toll Free: [phone] Fax: [phone]

## 2018-01-24 DIAGNOSIS — M25551 Pain in right hip: Secondary | ICD-10-CM | POA: Diagnosis not present
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
## 2018-01-25 ENCOUNTER — Encounter: Payer: Self-pay | Admitting: Physician Assistant

## 2018-01-25 ENCOUNTER — Ambulatory Visit: Payer: PPO | Admitting: Physician Assistant

## 2018-01-25 ENCOUNTER — Encounter

## 2018-01-25 VITALS — BP 100/50 | HR 66 | Ht 69.0 in | Wt 178.2 lb

## 2018-01-25 DIAGNOSIS — F32A Depression, unspecified: Secondary | ICD-10-CM

## 2018-01-25 DIAGNOSIS — I25118 Atherosclerotic heart disease of native coronary artery with other forms of angina pectoris: Secondary | ICD-10-CM | POA: Diagnosis not present

## 2018-01-25 DIAGNOSIS — R5381 Other malaise: Secondary | ICD-10-CM

## 2018-01-25 DIAGNOSIS — I480 Paroxysmal atrial fibrillation: Secondary | ICD-10-CM

## 2018-01-25 DIAGNOSIS — R945 Abnormal results of liver function studies: Secondary | ICD-10-CM

## 2018-01-25 DIAGNOSIS — E785 Hyperlipidemia, unspecified: Secondary | ICD-10-CM

## 2018-01-25 DIAGNOSIS — E876 Hypokalemia: Secondary | ICD-10-CM | POA: Diagnosis not present

## 2018-01-25 DIAGNOSIS — F329 Major depressive disorder, single episode, unspecified: Secondary | ICD-10-CM

## 2018-01-25 DIAGNOSIS — R079 Chest pain, unspecified: Secondary | ICD-10-CM | POA: Diagnosis not present

## 2018-01-25 DIAGNOSIS — I10 Essential (primary) hypertension: Secondary | ICD-10-CM

## 2018-01-25 DIAGNOSIS — D649 Anemia, unspecified: Secondary | ICD-10-CM | POA: Diagnosis not present

## 2018-01-25 DIAGNOSIS — R0602 Shortness of breath: Secondary | ICD-10-CM

## 2018-01-25 DIAGNOSIS — R7989 Other specified abnormal findings of blood chemistry: Secondary | ICD-10-CM

## 2018-01-25 NOTE — Patient Instructions (Addendum)
Medication Instructions: STOP the Amlodipine  If you need a refill on your cardiac medications before your next appointment, please call your pharmacy.   Labwork: Your provider would like for you to have the following labs today: CBC and CMET   Procedures/Testing: Your physician has requested that you have an echocardiogram. Echocardiography is a painless test that uses sound waves to create images of your heart. It provides your doctor with information about the size and shape of your heart and how well your heart's chambers and valves are working. You may receive an ultrasound enhancing agent through an IV if needed to better visualize your heart during the echo.This procedure takes approximately one hour. There are no restrictions for this procedure. This will take place at the [facility] clinic.   Your physician has requested that you have a lexiscan myoview. For further information please visit [email or web address]. Please follow instruction sheet, as given.   Follow-Up: Your physician wants you to keep your follow-up with Dr. Martinique on 04/08/18   Thank you for choosing [facility]!     [place]  Your provider has ordered a Stress Test with nuclear imaging. The purpose of this test is to evaluate the blood supply to your heart muscle. This procedure is referred to as a "Non-Invasive Stress Test." This is because other than having an IV started in your vein, nothing is inserted or "invades" your body. Cardiac stress tests are done to find areas of poor blood flow to the heart by determining the extent of coronary artery disease (CAD). Some patients exercise on a treadmill, which naturally increases the blood flow to your heart, while others who are unable to walk on a treadmill due to physical limitations have a pharmacologic/chemical stress agent called Lexiscan . This medicine will mimic walking on a treadmill by temporarily increasing your coronary blood  flow.   Please note: these test may take anywhere between 2-4 hours to complete  PLEASE REPORT TO [place] AT THE FIRST DESK WILL DIRECT YOU WHERE TO GO  Date of Procedure:_____________________________________  Arrival Time for Procedure:______________________________  Instructions regarding medication:  None to hold    PLEASE NOTIFY THE OFFICE AT LEAST 24 HOURS IN ADVANCE IF YOU ARE UNABLE TO KEEP YOUR APPOINTMENT.  [phone] AND  PLEASE NOTIFY NUCLEAR MEDICINE AT [facility] AT LEAST 24 HOURS IN ADVANCE IF YOU ARE UNABLE TO KEEP YOUR APPOINTMENT. [phone]  How to prepare for your Myoview test:  1. Do not eat or drink after midnight 2. No caffeine for 24 hours prior to test 3. No smoking 24 hours prior to test. 4. Your medication may be taken with water.  If your doctor stopped a medication because of this test, do not take that medication. 5. Ladies, please do not wear dresses.  Skirts or pants are appropriate. Please wear a short sleeve shirt. 6. No perfume, cologne or lotion. 7. Wear comfortable walking shoes. No heels!

## 2018-01-26 LAB — CBC
Hematocrit: 31.5 % — ABNORMAL LOW (ref 34.0–46.6)
Hemoglobin: 9.8 g/dL — ABNORMAL LOW (ref 11.1–15.9)
MCH: 24 pg — ABNORMAL LOW (ref 26.6–33.0)
MCHC: 31.1 g/dL — ABNORMAL LOW (ref 31.5–35.7)
MCV: 77 fL — AB (ref 79–97)
PLATELETS: 240 10*3/uL (ref 150–450)
RBC: 4.08 x10E6/uL (ref 3.77–5.28)
RDW: 19.5 % — AB (ref 12.3–15.4)
WBC: 6 10*3/uL (ref 3.4–10.8)

## 2018-01-26 LAB — COMPREHENSIVE METABOLIC PANEL
A/G RATIO: 1.7 (ref 1.2–2.2)
ALT: 17 IU/L (ref 0–32)
AST: 25 IU/L (ref 0–40)
Albumin: 3.6 g/dL (ref 3.5–4.8)
Alkaline Phosphatase: 108 IU/L (ref 39–117)
BILIRUBIN TOTAL: 0.6 mg/dL (ref 0.0–1.2)
BUN/Creatinine Ratio: 11 — ABNORMAL LOW (ref 12–28)
BUN: 11 mg/dL (ref 8–27)
CALCIUM: 9 mg/dL (ref 8.7–10.3)
CHLORIDE: 108 mmol/L — AB (ref 96–106)
CO2: 22 mmol/L (ref 20–29)
Creatinine, Ser: 0.98 mg/dL (ref 0.57–1.00)
GFR, EST AFRICAN AMERICAN: 63 mL/min/{1.73_m2} (ref >59)
GFR, EST NON AFRICAN AMERICAN: 55 mL/min/{1.73_m2} — AB (ref >59)
GLUCOSE: 132 mg/dL — AB (ref 65–99)
Globulin, Total: 2.1 g/dL (ref 1.5–4.5)
POTASSIUM: 3.5 mmol/L (ref 3.5–5.2)
Sodium: 143 mmol/L (ref 134–144)
TOTAL PROTEIN: 5.7 g/dL — AB (ref 6.0–8.5)

## 2018-01-27 ENCOUNTER — Other Ambulatory Visit: Payer: Self-pay | Admitting: Physician Assistant

## 2018-01-27 DIAGNOSIS — I4891 Unspecified atrial fibrillation: Secondary | ICD-10-CM

## 2018-01-27 DIAGNOSIS — R0602 Shortness of breath: Secondary | ICD-10-CM

## 2018-01-28 ENCOUNTER — Ambulatory Visit (INDEPENDENT_AMBULATORY_CARE_PROVIDER_SITE_OTHER): Payer: PPO

## 2018-01-28 ENCOUNTER — Other Ambulatory Visit: Payer: Self-pay

## 2018-01-28 DIAGNOSIS — E119 Type 2 diabetes mellitus without complications: Secondary | ICD-10-CM | POA: Diagnosis not present

## 2018-01-28 DIAGNOSIS — H53462 Homonymous bilateral field defects, left side: Secondary | ICD-10-CM | POA: Diagnosis not present

## 2018-01-28 DIAGNOSIS — R0602 Shortness of breath: Secondary | ICD-10-CM | POA: Diagnosis not present

## 2018-01-28 DIAGNOSIS — I4891 Unspecified atrial fibrillation: Secondary | ICD-10-CM

## 2018-02-01 ENCOUNTER — Encounter
Admission: RE | Admit: 2018-02-01 | Discharge: 2018-02-01 | Disposition: A | Discharge status: Home / Self Care | Payer: PPO | Source: Ambulatory Visit | Attending: Physician Assistant | Admitting: Physician Assistant

## 2018-02-01 DIAGNOSIS — R0602 Shortness of breath: Secondary | ICD-10-CM | POA: Diagnosis not present

## 2018-02-01 DIAGNOSIS — I480 Paroxysmal atrial fibrillation: Secondary | ICD-10-CM | POA: Insufficient documentation

## 2018-02-01 LAB — NM MYOCAR MULTI W/SPECT W/WALL MOTION / EF
CHL CUP RESTING HR STRESS: 44 {beats}/min
CSEPHR: 51 %
LV dias vol: 113 mL (ref 46–106)
LV sys vol: 46 mL
NUC STRESS TID: 0.96
Peak HR: 72 {beats}/min

## 2018-02-01 MED ORDER — REGADENOSON 0.4 MG/5ML IV SOLN
0.4000 mg | Freq: Once | INTRAVENOUS | Status: AC
  Administered 2018-02-01: 0.4 mg via INTRAVENOUS

## 2018-02-01 MED ORDER — TECHNETIUM TC 99M TETROFOSMIN IV KIT
31.8100 | PACK | Freq: Once | INTRAVENOUS | Status: AC | PRN
  Administered 2018-02-01: 31.81 via INTRAVENOUS

## 2018-02-01 MED ORDER — TECHNETIUM TC 99M TETROFOSMIN IV KIT
10.0000 | PACK | Freq: Once | INTRAVENOUS | Status: AC | PRN
  Administered 2018-02-01: 13.68 via INTRAVENOUS

## 2018-02-01 NOTE — Telephone Encounter (Signed)
Discussed with patient, chart from [place] reviewed. I think she was double booked for followup after recent admission, no further recommend beside those recommended by Christell Faith PA-C. Will cancel her appt with me this Thur. I reviewed with the patient the trivial pericardial effusion, I did not recommend for repeat echo unless SOB worsens. I informed her today's myoview result as well, quite stable, the location of scar is consistent with her known inferior STEMI in 2018, no ischemia noted. She says she recently had lipid panel done at outside facility, I asked her to forward lab to [facility]

## 2018-02-02 ENCOUNTER — Other Ambulatory Visit: Payer: Self-pay | Admitting: *Deleted

## 2018-02-02 DIAGNOSIS — I10 Essential (primary) hypertension: Secondary | ICD-10-CM | POA: Diagnosis not present

## 2018-02-02 DIAGNOSIS — I4891 Unspecified atrial fibrillation: Secondary | ICD-10-CM | POA: Diagnosis not present

## 2018-02-02 DIAGNOSIS — I214 Non-ST elevation (NSTEMI) myocardial infarction: Secondary | ICD-10-CM

## 2018-02-02 DIAGNOSIS — D649 Anemia, unspecified: Secondary | ICD-10-CM | POA: Diagnosis not present

## 2018-02-02 DIAGNOSIS — E119 Type 2 diabetes mellitus without complications: Secondary | ICD-10-CM | POA: Diagnosis not present

## 2018-02-02 DIAGNOSIS — E538 Deficiency of other specified B group vitamins: Secondary | ICD-10-CM | POA: Diagnosis not present

## 2018-02-02 DIAGNOSIS — E7849 Other hyperlipidemia: Secondary | ICD-10-CM | POA: Diagnosis not present

## 2018-02-03 ENCOUNTER — Ambulatory Visit: Payer: PPO | Admitting: Physician Assistant

## 2018-02-04 ENCOUNTER — Telehealth: Payer: Self-pay | Admitting: *Deleted

## 2018-02-04 DIAGNOSIS — E538 Deficiency of other specified B group vitamins: Secondary | ICD-10-CM | POA: Diagnosis not present

## 2018-02-04 NOTE — Telephone Encounter (Signed)
S/w Ms. [place]. She said she had just listened to the message and was going to call Cardiac Rehab nurse on Monday to discuss. She was appreciative.

## 2018-02-04 NOTE — Telephone Encounter (Signed)
Rise Mu, PA-C  P Cv Div [facility] Triage        Please check with patient or her daughter regarding the below message.   Thanks!   Previous Messages    ----- Message -----  From: Irving Shows, RN  Sent: 02/04/2018  9:29 AM EDT  To: Rise Mu, PA-C  Subject: Cardiac Rehab                   Thank you for [facility] referral. We would love to be able to work with her again, however we can't get her in for the same diagnosis/encounter (NSTEMI) that we used the last time she was here earlier this year. If she had had another NSTEMI or another qualifying diagnosis, we would be able to. She can come to our maintenance program, but I see per notes that she is already attending a gym with [place]. I tried calling her to check in with her and give her that option, but I have not heard back from her yet. Thank you for all you do!   Renita Papa RN, BSN  [facility]  [phone]

## 2018-02-07 DIAGNOSIS — R2689 Other abnormalities of gait and mobility: Secondary | ICD-10-CM | POA: Diagnosis not present

## 2018-02-07 DIAGNOSIS — Z1211 Encounter for screening for malignant neoplasm of colon: Secondary | ICD-10-CM | POA: Diagnosis not present

## 2018-02-10 ENCOUNTER — Telehealth: Payer: Self-pay | Admitting: Cardiology

## 2018-02-10 NOTE — Telephone Encounter (Signed)
New Message:   Patient has a fecal test results

## 2018-02-10 NOTE — Telephone Encounter (Signed)
Returned call to patient she stated she wanted Dr.Jordan to know PCP is referring her to GI due to recent low hgb and blood in stools.Advised I will make him aware.

## 2018-02-11 ENCOUNTER — Encounter: Payer: PPO | Attending: Internal Medicine | Admitting: *Deleted

## 2018-02-11 ENCOUNTER — Encounter: Payer: Self-pay | Admitting: *Deleted

## 2018-02-11 VITALS — BP 136/70 | Ht 68.0 in | Wt 173.1 lb

## 2018-02-11 DIAGNOSIS — E119 Type 2 diabetes mellitus without complications: Secondary | ICD-10-CM

## 2018-02-11 DIAGNOSIS — E538 Deficiency of other specified B group vitamins: Secondary | ICD-10-CM | POA: Diagnosis not present

## 2018-02-11 DIAGNOSIS — Z713 Dietary counseling and surveillance: Secondary | ICD-10-CM | POA: Diagnosis not present

## 2018-02-11 NOTE — Patient Instructions (Signed)
Check blood sugars 2 x day before breakfast and 2 hrs after supper every day Bring blood sugar records to the next class  Exercise: Continue tennis for  90 minutes 2-3 days a week  Eat 3 meals day,  1-2  snacks a day Space meals 4-6 hours apart  Return for classes on:

## 2018-02-11 NOTE — Progress Notes (Signed)
Diabetes Self-Management Education  Visit Type: First/Initial  Appt. Start Time: 1330 Appt. End Time: 2956  02/11/2018  Margaret [name], identified by name and date of birth, is a 77 y.o. female with a diagnosis of Diabetes: Type 2.   ASSESSMENT  Blood pressure 136/70, height 5\' 8"  (1.727 m), weight 173 lb 1.6 oz (78.5 kg). Body mass index is 26.32 kg/m.  Diabetes Self-Management Education - 02/11/18 1600      Visit Information   Visit Type  First/Initial      Initial Visit   Diabetes Type  Type 2    Are you currently following a meal plan?  No    Are you taking your medications as prescribed?  Yes    Date Diagnosed  4 weeks      Health Coping   How would you rate your overall health?  Fair      Psychosocial Assessment   Patient Belief/Attitude about Diabetes  Other (comment)   "not thrilled"   Self-care barriers  Unsteady gait/risk for falls;Other (comment)   memory issues   Self-management support  Doctor's office;Family    Other persons present  Family Member   Daughter   Patient Concerns  Glycemic Control;Nutrition/Meal planning;Monitoring    Special Needs  None    Learning Readiness  Ready    How often do you need to have someone help you when you read instructions, pamphlets, or other written materials from your doctor or pharmacy?  2 - Rarely    What is the last grade level you completed in school?  Nursing school      Pre-Education Assessment   Patient understands the diabetes disease and treatment process.  Needs Instruction    Patient understands incorporating nutritional management into lifestyle.  Needs Instruction    Patient undertands incorporating physical activity into lifestyle.  Needs Review    Patient understands using medications safely.  Needs Instruction    Patient understands monitoring blood glucose, interpreting and using results  Needs Instruction    Patient understands prevention, detection, and treatment of acute complications.  Needs  Instruction    Patient understands prevention, detection, and treatment of chronic complications.  Needs Instruction    Patient understands how to develop strategies to address psychosocial issues.  Needs Instruction    Patient understands how to develop strategies to promote health/change behavior.  Needs Instruction      Complications   Last HgB A1C per patient/outside source  7.4 %   01/12/18   How often do you check your blood sugar?  0 times/day (not testing)   She has a meter and her daughter (who is a Marine scientist) will be instructing her on how to use it this weekend.    Have you had a dilated eye exam in the past 12 months?  Yes    Have you had a dental exam in the past 12 months?  Yes    Are you checking your feet?  No      Dietary Intake   Breakfast  cheese and English muffin    Snack (morning)  pretzels    Lunch  hamburger, cheese, pretzels    Snack (afternoon)  pretzels, fruit    Dinner  grilled chicken, steak, pork, potatoes, corn, peas, green beans, rice     Beverage(s)  water, diet soda      Exercise   Exercise Type  Moderate (swimming / aerobic walking)   plays tennis   How many days per week to you  exercise?  2    How many minutes per day do you exercise?  90    Total minutes per week of exercise  180      Patient Education   Previous Diabetes Education  No    Disease state   Definition of diabetes, type 1 and 2, and the diagnosis of diabetes    Nutrition management   Role of diet in the treatment of diabetes and the relationship between the three main macronutrients and blood glucose level;Food label reading, portion sizes and measuring food.;Reviewed blood glucose goals for pre and post meals and how to evaluate the patients' food intake on their blood glucose level.    Physical activity and exercise   Role of exercise on diabetes management, blood pressure control and cardiac health.    Monitoring  Purpose and frequency of SMBG.;Taught/discussed recording of test  results and interpretation of SMBG.;Identified appropriate SMBG and/or A1C goals.    Chronic complications  Relationship between chronic complications and blood glucose control    Psychosocial adjustment  Role of stress on diabetes;Identified and addressed patients feelings and concerns about diabetes   Referral made to [facility] for free counseling session.      Individualized Goals (developed by patient)   Reducing Risk  Improve blood sugars Prevent diabetes complications     Outcomes   Expected Outcomes  Demonstrated interest in learning. Expect positive outcomes    Future DMSE  2 wks       Individualized Plan for Diabetes Self-Management Training:   Learning Objective:  Patient will have a greater understanding of diabetes self-management. Patient education plan is to attend individual and/or group sessions per assessed needs and concerns.   Plan:   Patient Instructions  Check blood sugars 2 x day before breakfast and 2 hrs after supper every day Bring blood sugar records to the next class Exercise: Continue tennis for  90 minutes 2-3 days a week Eat 3 meals day,  1-2  snacks a day Space meals 4-6 hours apart  Expected Outcomes:  Demonstrated interest in learning. Expect positive outcomes  Education material provided:  General Meal Planning Guidelines Simple Meal Plan  If problems or questions, patient to contact team via:  Johny Drilling, [place], [place], CDE [phone]  Future DSME appointment: 2 wks  February 28, 2018 for Diabetes Class 1

## 2018-02-14 ENCOUNTER — Telehealth: Payer: Self-pay | Admitting: Cardiology

## 2018-02-14 DIAGNOSIS — Z961 Presence of intraocular lens: Secondary | ICD-10-CM | POA: Diagnosis not present

## 2018-02-14 DIAGNOSIS — H1859 Other hereditary corneal dystrophies: Secondary | ICD-10-CM | POA: Diagnosis not present

## 2018-02-14 NOTE — Telephone Encounter (Signed)
Received records from [facility] on 02/14/18, Appt 04/08/18 @ 4:20PM. NV

## 2018-02-15 DIAGNOSIS — R2689 Other abnormalities of gait and mobility: Secondary | ICD-10-CM | POA: Diagnosis not present

## 2018-02-16 ENCOUNTER — Other Ambulatory Visit: Payer: Self-pay | Admitting: Gastroenterology

## 2018-02-16 DIAGNOSIS — Z87891 Personal history of nicotine dependence: Secondary | ICD-10-CM

## 2018-02-16 DIAGNOSIS — R194 Change in bowel habit: Secondary | ICD-10-CM

## 2018-02-16 DIAGNOSIS — R634 Abnormal weight loss: Secondary | ICD-10-CM | POA: Diagnosis not present

## 2018-02-16 DIAGNOSIS — D508 Other iron deficiency anemias: Secondary | ICD-10-CM

## 2018-02-16 DIAGNOSIS — D509 Iron deficiency anemia, unspecified: Secondary | ICD-10-CM | POA: Diagnosis not present

## 2018-02-18 ENCOUNTER — Telehealth: Payer: Self-pay

## 2018-02-18 DIAGNOSIS — E538 Deficiency of other specified B group vitamins: Secondary | ICD-10-CM | POA: Diagnosis not present

## 2018-02-18 DIAGNOSIS — R634 Abnormal weight loss: Secondary | ICD-10-CM | POA: Diagnosis not present

## 2018-02-18 DIAGNOSIS — R2689 Other abnormalities of gait and mobility: Secondary | ICD-10-CM | POA: Diagnosis not present

## 2018-02-18 DIAGNOSIS — R194 Change in bowel habit: Secondary | ICD-10-CM | POA: Diagnosis not present

## 2018-02-18 DIAGNOSIS — D509 Iron deficiency anemia, unspecified: Secondary | ICD-10-CM | POA: Diagnosis not present

## 2018-02-18 NOTE — Telephone Encounter (Signed)
Spoke to patient she stated she has a new insurance.She needs a letter with her diagnosis and verifying she is under Dr.Jordan's care.Letter mailed to patient.She also needs a copy of her statement.Advised to call billing at [phone].

## 2018-02-22 DIAGNOSIS — R2689 Other abnormalities of gait and mobility: Secondary | ICD-10-CM | POA: Diagnosis not present

## 2018-02-25 ENCOUNTER — Ambulatory Visit
Admission: RE | Admit: 2018-02-25 | Discharge: 2018-02-25 | Disposition: A | Discharge status: Home / Self Care | Payer: PPO | Source: Ambulatory Visit | Attending: Gastroenterology | Admitting: Gastroenterology

## 2018-02-25 DIAGNOSIS — R911 Solitary pulmonary nodule: Secondary | ICD-10-CM | POA: Diagnosis not present

## 2018-02-25 DIAGNOSIS — R2689 Other abnormalities of gait and mobility: Secondary | ICD-10-CM | POA: Diagnosis not present

## 2018-02-25 DIAGNOSIS — I517 Cardiomegaly: Secondary | ICD-10-CM | POA: Diagnosis not present

## 2018-02-25 DIAGNOSIS — R634 Abnormal weight loss: Secondary | ICD-10-CM

## 2018-02-25 DIAGNOSIS — D508 Other iron deficiency anemias: Secondary | ICD-10-CM

## 2018-02-25 DIAGNOSIS — I700 Atherosclerosis of aorta: Secondary | ICD-10-CM | POA: Diagnosis not present

## 2018-02-25 DIAGNOSIS — Z9884 Bariatric surgery status: Secondary | ICD-10-CM | POA: Diagnosis not present

## 2018-02-25 DIAGNOSIS — D509 Iron deficiency anemia, unspecified: Secondary | ICD-10-CM | POA: Diagnosis not present

## 2018-02-25 DIAGNOSIS — Z87891 Personal history of nicotine dependence: Secondary | ICD-10-CM | POA: Diagnosis not present

## 2018-02-25 DIAGNOSIS — E538 Deficiency of other specified B group vitamins: Secondary | ICD-10-CM | POA: Diagnosis not present

## 2018-02-25 DIAGNOSIS — R194 Change in bowel habit: Secondary | ICD-10-CM | POA: Diagnosis not present

## 2018-02-25 HISTORY — DX: Heart failure, unspecified: I50.9

## 2018-02-25 MED ORDER — IOPAMIDOL (ISOVUE-300) INJECTION 61%
100.0000 mL | Freq: Once | INTRAVENOUS | Status: AC | PRN
  Administered 2018-02-25: 100 mL via INTRAVENOUS

## 2018-02-28 ENCOUNTER — Ambulatory Visit: Payer: PPO

## 2018-02-28 ENCOUNTER — Telehealth: Payer: Self-pay | Admitting: Dietician

## 2018-02-28 DIAGNOSIS — E119 Type 2 diabetes mellitus without complications: Secondary | ICD-10-CM | POA: Diagnosis not present

## 2018-02-28 DIAGNOSIS — H53462 Homonymous bilateral field defects, left side: Secondary | ICD-10-CM | POA: Diagnosis not present

## 2018-02-28 NOTE — Telephone Encounter (Signed)
Called patient as she missed coming for class 1 today; she misplaced her calender. She has rescheduled to the next class series beginning on 03/28/18.

## 2018-03-01 DIAGNOSIS — R2689 Other abnormalities of gait and mobility: Secondary | ICD-10-CM | POA: Diagnosis not present

## 2018-03-03 DIAGNOSIS — R2689 Other abnormalities of gait and mobility: Secondary | ICD-10-CM | POA: Diagnosis not present

## 2018-03-08 ENCOUNTER — Other Ambulatory Visit: Payer: Self-pay | Admitting: Cardiology

## 2018-03-11 DIAGNOSIS — R935 Abnormal findings on diagnostic imaging of other abdominal regions, including retroperitoneum: Secondary | ICD-10-CM | POA: Diagnosis not present

## 2018-03-11 DIAGNOSIS — Z7901 Long term (current) use of anticoagulants: Secondary | ICD-10-CM | POA: Diagnosis not present

## 2018-03-11 DIAGNOSIS — D509 Iron deficiency anemia, unspecified: Secondary | ICD-10-CM | POA: Diagnosis not present

## 2018-03-11 DIAGNOSIS — R9389 Abnormal findings on diagnostic imaging of other specified body structures: Secondary | ICD-10-CM | POA: Diagnosis not present

## 2018-03-11 DIAGNOSIS — E538 Deficiency of other specified B group vitamins: Secondary | ICD-10-CM | POA: Diagnosis not present

## 2018-03-11 DIAGNOSIS — Z7902 Long term (current) use of antithrombotics/antiplatelets: Secondary | ICD-10-CM | POA: Diagnosis not present

## 2018-03-11 DIAGNOSIS — R2689 Other abnormalities of gait and mobility: Secondary | ICD-10-CM | POA: Diagnosis not present

## 2018-03-14 DIAGNOSIS — J9859 Other diseases of mediastinum, not elsewhere classified: Secondary | ICD-10-CM | POA: Diagnosis not present

## 2018-03-14 DIAGNOSIS — R2689 Other abnormalities of gait and mobility: Secondary | ICD-10-CM | POA: Diagnosis not present

## 2018-03-14 DIAGNOSIS — R911 Solitary pulmonary nodule: Secondary | ICD-10-CM | POA: Diagnosis not present

## 2018-03-16 DIAGNOSIS — R2689 Other abnormalities of gait and mobility: Secondary | ICD-10-CM | POA: Diagnosis not present

## 2018-03-25 ENCOUNTER — Telehealth: Payer: Self-pay | Admitting: Physician Assistant

## 2018-03-25 DIAGNOSIS — R2689 Other abnormalities of gait and mobility: Secondary | ICD-10-CM | POA: Diagnosis not present

## 2018-03-25 NOTE — Telephone Encounter (Signed)
   [facility] Pre-operative Risk Assessment    Request for surgical clearance:  1. What type of surgery is being performed? Colonoscopy and EGD  2. When is this surgery scheduled? Not listed  3. What type of clearance is required (medical clearance vs. Pharmacy clearance to hold med vs. Both)? pharmacy  4. Are there any medications that need to be held prior to surgery and how long? Plavix  5. Practice name and name of physician performing surgery? [facility], Dr. Gustavo Lah  6. What is your office phone number not listed    7.   What is your office fax number [phone]  8.   Anesthesia type (None, local, MAC, general) ? Not listed  Margaret [name] 03/25/2018, 4:02 PM  _________________________________________________________________   (provider comments below)

## 2018-03-25 NOTE — Telephone Encounter (Signed)
Patient has follow up with Dr Martinique in [place] who is her primary cardiologist.  Routing to NL triage.

## 2018-03-28 ENCOUNTER — Telehealth: Payer: Self-pay

## 2018-03-28 ENCOUNTER — Encounter: Payer: PPO | Attending: Internal Medicine

## 2018-03-28 DIAGNOSIS — R2689 Other abnormalities of gait and mobility: Secondary | ICD-10-CM | POA: Diagnosis not present

## 2018-03-28 DIAGNOSIS — E119 Type 2 diabetes mellitus without complications: Secondary | ICD-10-CM | POA: Insufficient documentation

## 2018-03-28 DIAGNOSIS — Z713 Dietary counseling and surveillance: Secondary | ICD-10-CM | POA: Insufficient documentation

## 2018-03-28 NOTE — Telephone Encounter (Addendum)
   [facility] Pre-operative Risk Assessment    Request for surgical clearance:  1. What type of surgery is being performed? Colon EGD   2. When is this surgery scheduled? TBD   3. What type of clearance is required (medical clearance vs. Pharmacy clearance to hold med vs. Both)? Pharmacy   4. Are there any medications that need to be held prior to surgery and how long? Plavix,     5. Practice name and name of physician performing surgery? [facility], Dr. Gustavo Lah    6. What is your office phone number? [phone]    7.   What is your office fax number? [place] Dept   8.   Anesthesia type (None, local, MAC, general) ? Not specified   Margaret [name] 03/28/2018, 3:44 PM  _________________________________________________________________   (provider comments below)

## 2018-03-29 DIAGNOSIS — N83201 Unspecified ovarian cyst, right side: Secondary | ICD-10-CM | POA: Diagnosis not present

## 2018-03-29 DIAGNOSIS — E538 Deficiency of other specified B group vitamins: Secondary | ICD-10-CM | POA: Diagnosis not present

## 2018-03-30 DIAGNOSIS — R2689 Other abnormalities of gait and mobility: Secondary | ICD-10-CM | POA: Diagnosis not present

## 2018-03-30 NOTE — Telephone Encounter (Signed)
   Primary Cardiologist: Peter Martinique, MD  Chart reviewed as part of pre-operative protocol coverage. Last PCI 06/14/17>> treated with ASA and Brillinta. Diagnosed with afib 12/2017. Stopped ASA and brillinta changed to plavix due to addition of Eliquis.   Patient has appointment with Dr. Martinique 04/08/18. Will review clearance at that time. I will route this recommendation to requesting provider via Epic.     [place], [place] 03/30/2018, 2:11 PM

## 2018-03-31 ENCOUNTER — Telehealth: Payer: Self-pay | Admitting: Cardiovascular Disease

## 2018-03-31 NOTE — Telephone Encounter (Signed)
   [facility] Pre-operative Risk Assessment    Request for surgical clearance:  1. What type of surgery is being performed? Colonoscopy and EGD  When is this surgery scheduled? Not listed  2. What type of clearance is required (medical clearance vs. Pharmacy clearance to hold med vs. Both)? pharmacy  3. Are there any medications that need to be held prior to surgery and how long? Plavix, 7 days prior  4. Practice name and name of physician performing surgery? Dr. Gustavo Lah, [facility]  5. What is your office phone number not listed   7.   What is your office fax number [phone]  8.   Anesthesia type (None, local, MAC, general) ? Monitored sedation   Margaret [name] 03/31/2018, 9:53 AM  _________________________________________________________________   (provider comments below)

## 2018-03-31 NOTE — Telephone Encounter (Signed)
Primary Cardiologist is Dr Martinique. Clearance encounter has already been started on 03/28/18. Closing this encounter.

## 2018-04-01 ENCOUNTER — Telehealth: Payer: Self-pay | Admitting: Dietician

## 2018-04-01 NOTE — Telephone Encounter (Signed)
Called patient to reschedule class series, as she missed class 1 on 03/28/18. Patient states she forgot about the class. Offered next evening series starting 11/4, or next morning series starting 10/17. Patient will check her calendar and call back to schedule one of those options.

## 2018-04-04 DIAGNOSIS — R030 Elevated blood-pressure reading, without diagnosis of hypertension: Secondary | ICD-10-CM | POA: Diagnosis not present

## 2018-04-04 DIAGNOSIS — R7989 Other specified abnormal findings of blood chemistry: Secondary | ICD-10-CM | POA: Diagnosis not present

## 2018-04-04 DIAGNOSIS — E611 Iron deficiency: Secondary | ICD-10-CM | POA: Diagnosis not present

## 2018-04-04 DIAGNOSIS — H53462 Homonymous bilateral field defects, left side: Secondary | ICD-10-CM | POA: Diagnosis not present

## 2018-04-04 DIAGNOSIS — E538 Deficiency of other specified B group vitamins: Secondary | ICD-10-CM | POA: Diagnosis not present

## 2018-04-04 DIAGNOSIS — Z8673 Personal history of transient ischemic attack (TIA), and cerebral infarction without residual deficits: Secondary | ICD-10-CM | POA: Diagnosis not present

## 2018-04-06 ENCOUNTER — Other Ambulatory Visit: Payer: Self-pay | Admitting: Neurology

## 2018-04-06 DIAGNOSIS — Z8673 Personal history of transient ischemic attack (TIA), and cerebral infarction without residual deficits: Secondary | ICD-10-CM
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
## 2018-04-08 ENCOUNTER — Encounter: Payer: Self-pay | Admitting: Cardiology

## 2018-04-08 ENCOUNTER — Ambulatory Visit (INDEPENDENT_AMBULATORY_CARE_PROVIDER_SITE_OTHER): Payer: PPO | Admitting: Cardiology

## 2018-04-08 VITALS — BP 192/72 | HR 63 | Ht 68.0 in | Wt 182.0 lb

## 2018-04-08 DIAGNOSIS — I25118 Atherosclerotic heart disease of native coronary artery with other forms of angina pectoris: Secondary | ICD-10-CM | POA: Diagnosis not present

## 2018-04-08 DIAGNOSIS — D500 Iron deficiency anemia secondary to blood loss (chronic): Secondary | ICD-10-CM

## 2018-04-08 DIAGNOSIS — N9489 Other specified conditions associated with female genital organs and menstrual cycle: Secondary | ICD-10-CM | POA: Diagnosis not present

## 2018-04-08 DIAGNOSIS — E785 Hyperlipidemia, unspecified: Secondary | ICD-10-CM | POA: Diagnosis not present

## 2018-04-08 DIAGNOSIS — I10 Essential (primary) hypertension: Secondary | ICD-10-CM

## 2018-04-08 DIAGNOSIS — I480 Paroxysmal atrial fibrillation: Secondary | ICD-10-CM

## 2018-04-08 MED ORDER — AMLODIPINE BESYLATE 2.5 MG PO TABS: 2.5000 mg | ORAL_TABLET | Freq: Every day | ORAL | 3 refills | Status: DC

## 2018-04-08 NOTE — Patient Instructions (Signed)
We will resume amlodipine 2.5 mg daily for blood pressure  In 2 months you can stop Plavix so you can proceed with GI evaluation.

## 2018-04-13 ENCOUNTER — Ambulatory Visit: Payer: PPO | Attending: Neurology

## 2018-04-14 DIAGNOSIS — N83201 Unspecified ovarian cyst, right side: Secondary | ICD-10-CM | POA: Diagnosis not present

## 2018-04-19 ENCOUNTER — Telehealth: Payer: Self-pay

## 2018-04-19 NOTE — Telephone Encounter (Signed)
Called patient no answer.Left message on personal voice mail to call me back about needing surgical clearance.

## 2018-04-20 ENCOUNTER — Telehealth: Payer: Self-pay

## 2018-04-20 NOTE — Telephone Encounter (Signed)
Received call from patient.She stated her GYN Dr.Dr.Thomas Schermerhorn wants to speak to Dr.Jordan about a mass he found on one of her ovaries.Advised Dr.Jordan out of office this week.I will let him know on Monday 04/25/18.

## 2018-04-25 ENCOUNTER — Telehealth: Payer: Self-pay | Admitting: Cardiology

## 2018-04-25 NOTE — Telephone Encounter (Signed)
I had a phone conversation with Dr. Laverta Baltimore concerning Margaret [name]. She has a complex ovarian cyst and needs surgery. Also needs a colonoscopy for iron deficiency anemia and heme positive stools. I think she is a suitable candidate for both procedures from a cardiac standpoint. She will need to hold Plavix and Eliquis for these procedures. It would be ideal to schedule these procedures close together to minimize time off therapy. Dr. Altamease Oiler will co-ordinate with GI. Would recommend holding Plavix for 5 days and Eliquis for 2 days prior to procedures and resume post op once safe from a bleeding standpoint. She is 10.5 months out for prior stent procedure so I don't feel further delay is necessary.   Peter Martinique MD, [facility]

## 2018-04-29 DIAGNOSIS — E119 Type 2 diabetes mellitus without complications: Secondary | ICD-10-CM | POA: Diagnosis not present

## 2018-04-29 DIAGNOSIS — E538 Deficiency of other specified B group vitamins: Secondary | ICD-10-CM | POA: Diagnosis not present

## 2018-05-03 ENCOUNTER — Encounter: Payer: Self-pay | Admitting: *Deleted

## 2018-05-05 DIAGNOSIS — I10 Essential (primary) hypertension: Secondary | ICD-10-CM | POA: Diagnosis not present

## 2018-05-05 DIAGNOSIS — E7849 Other hyperlipidemia: Secondary | ICD-10-CM | POA: Diagnosis not present

## 2018-05-05 DIAGNOSIS — E119 Type 2 diabetes mellitus without complications: Secondary | ICD-10-CM | POA: Diagnosis not present

## 2018-05-05 DIAGNOSIS — Z8673 Personal history of transient ischemic attack (TIA), and cerebral infarction without residual deficits: Secondary | ICD-10-CM | POA: Diagnosis not present

## 2018-05-05 DIAGNOSIS — D509 Iron deficiency anemia, unspecified: Secondary | ICD-10-CM | POA: Diagnosis not present

## 2018-05-13 DIAGNOSIS — L299 Pruritus, unspecified: Secondary | ICD-10-CM | POA: Diagnosis not present

## 2018-05-13 DIAGNOSIS — H903 Sensorineural hearing loss, bilateral: Secondary | ICD-10-CM | POA: Diagnosis not present

## 2018-05-13 DIAGNOSIS — H6120 Impacted cerumen, unspecified ear: Secondary | ICD-10-CM | POA: Diagnosis not present

## 2018-05-30 DIAGNOSIS — E538 Deficiency of other specified B group vitamins: Secondary | ICD-10-CM | POA: Diagnosis not present

## 2018-06-01 DIAGNOSIS — M79645 Pain in left finger(s): Secondary | ICD-10-CM | POA: Diagnosis not present

## 2018-06-01 DIAGNOSIS — M170 Bilateral primary osteoarthritis of knee: Secondary | ICD-10-CM | POA: Diagnosis not present

## 2018-06-07 DIAGNOSIS — D509 Iron deficiency anemia, unspecified: Secondary | ICD-10-CM | POA: Diagnosis not present

## 2018-06-07 NOTE — H&P (Signed)
Margaret [name] is a 77 y.o. female here for L/S BSO .fup from CT scan that showed a right ovarian cyst .Ct scan finding of a 4.6 cm right ovarian cyst . Pt is being worked up for hematochezia. No pelvicpain . She is s/p a TVH 20+ yrs ago . She was not aware that she had ovaries . No pain . SHe has been cleared by her cardiologist and will be off her Eliquis and Plavix for periop.    u/s :  Hysterectomy  No free fluid seen  Lt ovary not seen  Rt ovarian complex cyst with septations=5.53 x 3.28 x 4.36cm; septations=0.23 & 0.12cm  Rt cyst volume=41.34ml  Doppler waveforms performed on Rt ovary   she has significant CAD with MI and angioplasty 05/2017 . She is currently under the care of Dr Martinique    Past Medical History:  has a past medical history of Cancer (CMS-HCC), Colon polyp, Heart disease, History of coronary artery stent placement, Hypertension, and Stroke (CMS-HCC).  Past Surgical History:  has a past surgical history that includes Cholecystectomy; Fracture surgery; Knee arthroscopy; Joint replacement; Roux-en-y procedure; Colonoscopy (2006); right leg surgery; Hysterectomy; coronary artery stent; and other surgery. Family History: family history includes Brain cancer in her mother; Colon cancer in her mother; Depression in her brother and sister; Diabetes type I in her maternal grandmother; Heart disease in her father; High blood pressure (Hypertension) in her sister; Myocardial Infarction (Heart attack) in her father; Rheum arthritis in her sister. Social History:  reports that she quit smoking about 32 years ago. She has never used smokeless tobacco. She reports that she does not drink alcohol or use drugs. OB/GYN History:  OB History   No obstetric history on file.     Allergies: is allergic to adhesive tape-silicones; erythromycin; and erythromycin base. Medications:  Current Outpatient Medications:  .  blood glucose diagnostic (GLUCOSE BLOOD) test strip, Use 1  each (1 strip total) 2 (two) times daily Use 1 strip twice daily to check blood sugars Dx: E11.9, Disp: 100 strip, Rfl: 5 .  blood-glucose meter Misc, OneTouch Meter. Use to check blood sugar twice daily Dx: E11.9, Disp: 1 each, Rfl: 0 .  clopidogrel (PLAVIX) 75 mg tablet, Take 75 mg by mouth once daily  , Disp: , Rfl:  .  ELIQUIS 5 mg tablet, TK 1 T PO BID, Disp: , Rfl: 0 .  ergocalciferol, vitamin D2, 50,000 unit capsule, Take 1 capsule (50,000 Units total) by mouth once a week, Disp: 8 capsule, Rfl: 0 .  ezetimibe (ZETIA) 10 mg tablet, Take 10 mg by mouth once daily  , Disp: , Rfl: 5 .  HYDROcodone-acetaminophen (NORCO) 5-325 mg tablet, TK 1 T PO Q 4 TO 6 H PRN P, Disp: , Rfl: 0 .  lancets, Use 1 each 2 (two) times daily Use twice daily to check blood sugar. Dx. E11.9, Disp: 100 each, Rfl: 5 .  pantoprazole (PROTONIX) 40 MG DR tablet, Take 1 tablet (40 mg total) by mouth 2 (two) times daily, Disp: 60 tablet, Rfl: 3 .  potassium chloride (K-DUR, KLOR-CON) 10 mEq ER tablet, Take 1 tablet (10 mEq total) by mouth once daily, Disp: 30 tablet, Rfl: 1 .  sertraline (ZOLOFT) 100 MG tablet, TK 1 T PO  QAM, Disp: , Rfl: 3 .  atorvastatin (LIPITOR) 80 MG tablet, Take 1 tablet (80 mg total) by mouth once daily, Disp: 90 tablet, Rfl: 0 .  metoprolol tartrate (LOPRESSOR) 25 MG tablet, Take 1 tablet (  25 mg total) by mouth 2 (two) times daily, Disp: 180 tablet, Rfl: 0  Current Facility-Administered Medications:  .  cyanocobalamin (VITAMIN B12) injection 1,000 mcg, 1,000 mcg, Intramuscular, Q30 Days, Velna Ochs, MD, 1,000 mcg at 03/29/18 1420  Review of Systems: General:                      No fatigue or weight loss Eyes:                           No vision changes Ears:                            No hearing difficulty Respiratory:                No cough or shortness of breath Pulmonary:                  No asthma or shortness of breath Cardiovascular:           No chest pain, palpitations,  dyspnea on exertion Gastrointestinal:          No abdominal bloating, chronic diarrhea, constipations, masses, pain or hematochezia Genitourinary:             No hematuria, dysuria, abnormal vaginal discharge, pelvic pain, Menometrorrhagia Lymphatic:                   No swollen lymph nodes Musculoskeletal:         No muscle weakness Neurologic:                  No extremity weakness, syncope, seizure disorder Psychiatric:                  No history of depression, delusions or suicidal/homicidal ideation    Exam:      Vitals:   04/14/18 1010  BP: 144/80  Pulse: 74    Body mass index is 27.67 kg/m.  WDWN white/ female in NAD   Lungs: CTA  CV : RRR without murmur   Breast: exam done in sitting and lying position : No dimpling or retraction, no dominant mass, no spontaneous discharge, no axillary adenopathy Neck:  no thyromegaly Abdomen: soft , no mass, normal active bowel sounds,  non-tender, no rebound tenderness Pelvic: tanner stage 5 ,  External genitalia: vulva /labia no lesions Urethra: no prolapse Vagina: normal physiologic d/c Cervix: no lesions, no cervical motion tenderness   Uterus: normal size shape and contour, non-tender Adnexa: no mass,  non-tender   Rectovaginal: no mass heme negative  Impression:   The encounter diagnosis was Cyst of right ovary. Nodularity at the base slightly projecting into the cavity is abnormal in appearance.  Recent  Normal ca 125 is reassuring    Plan:   Spoke to her about L/S BSO given the appearance of the cyst. She needs to be cleared by her cardiologist given recent MI and angioplasty and antiplt meds . She is scheduled for a colonoscopy 06/21/18. ( she will be off plavix at that time)        Orders Placed This Encounter  Procedures  . US pelvic transvaginal    [facility]- 3 mth follow up sono for right ovarian cyst    Standing Status:   Future    Standing Expiration Date:   04/15/2019

## 2018-06-20 ENCOUNTER — Encounter: Payer: Self-pay | Admitting: *Deleted

## 2018-06-20 ENCOUNTER — Other Ambulatory Visit: Payer: Self-pay

## 2018-06-20 ENCOUNTER — Encounter
Admission: RE | Admit: 2018-06-20 | Discharge: 2018-06-20 | Disposition: A | Discharge status: Home / Self Care | Payer: PPO | Source: Ambulatory Visit | Attending: Obstetrics and Gynecology | Admitting: Obstetrics and Gynecology

## 2018-06-20 DIAGNOSIS — Q438 Other specified congenital malformations of intestine: Secondary | ICD-10-CM | POA: Diagnosis not present

## 2018-06-20 DIAGNOSIS — R195 Other fecal abnormalities: Secondary | ICD-10-CM | POA: Diagnosis not present

## 2018-06-20 DIAGNOSIS — F419 Anxiety disorder, unspecified: Secondary | ICD-10-CM | POA: Diagnosis not present

## 2018-06-20 DIAGNOSIS — I110 Hypertensive heart disease with heart failure: Secondary | ICD-10-CM | POA: Diagnosis not present

## 2018-06-20 DIAGNOSIS — I252 Old myocardial infarction: Secondary | ICD-10-CM | POA: Diagnosis not present

## 2018-06-20 DIAGNOSIS — Z01818 Encounter for other preprocedural examination: Secondary | ICD-10-CM

## 2018-06-20 DIAGNOSIS — D126 Benign neoplasm of colon, unspecified: Secondary | ICD-10-CM | POA: Diagnosis not present

## 2018-06-20 DIAGNOSIS — Z7902 Long term (current) use of antithrombotics/antiplatelets: Secondary | ICD-10-CM | POA: Diagnosis not present

## 2018-06-20 DIAGNOSIS — I2510 Atherosclerotic heart disease of native coronary artery without angina pectoris: Secondary | ICD-10-CM | POA: Diagnosis not present

## 2018-06-20 DIAGNOSIS — R1900 Intra-abdominal and pelvic swelling, mass and lump, unspecified site: Secondary | ICD-10-CM | POA: Diagnosis not present

## 2018-06-20 DIAGNOSIS — E119 Type 2 diabetes mellitus without complications: Secondary | ICD-10-CM | POA: Diagnosis not present

## 2018-06-20 DIAGNOSIS — D125 Benign neoplasm of sigmoid colon: Secondary | ICD-10-CM | POA: Diagnosis not present

## 2018-06-20 DIAGNOSIS — K5730 Diverticulosis of large intestine without perforation or abscess without bleeding: Secondary | ICD-10-CM | POA: Diagnosis not present

## 2018-06-20 DIAGNOSIS — K2950 Unspecified chronic gastritis without bleeding: Secondary | ICD-10-CM | POA: Diagnosis not present

## 2018-06-20 DIAGNOSIS — I509 Heart failure, unspecified: Secondary | ICD-10-CM | POA: Diagnosis not present

## 2018-06-20 DIAGNOSIS — Z79899 Other long term (current) drug therapy: Secondary | ICD-10-CM | POA: Diagnosis not present

## 2018-06-20 DIAGNOSIS — K2210 Ulcer of esophagus without bleeding: Secondary | ICD-10-CM | POA: Diagnosis not present

## 2018-06-20 DIAGNOSIS — I480 Paroxysmal atrial fibrillation: Secondary | ICD-10-CM | POA: Diagnosis not present

## 2018-06-20 DIAGNOSIS — F329 Major depressive disorder, single episode, unspecified: Secondary | ICD-10-CM | POA: Diagnosis not present

## 2018-06-20 DIAGNOSIS — K210 Gastro-esophageal reflux disease with esophagitis: Secondary | ICD-10-CM | POA: Diagnosis not present

## 2018-06-20 DIAGNOSIS — E785 Hyperlipidemia, unspecified: Secondary | ICD-10-CM | POA: Diagnosis not present

## 2018-06-20 DIAGNOSIS — Z7901 Long term (current) use of anticoagulants: Secondary | ICD-10-CM | POA: Diagnosis not present

## 2018-06-20 DIAGNOSIS — D509 Iron deficiency anemia, unspecified: Secondary | ICD-10-CM | POA: Diagnosis not present

## 2018-06-20 DIAGNOSIS — R1110 Vomiting, unspecified: Secondary | ICD-10-CM | POA: Diagnosis not present

## 2018-06-20 DIAGNOSIS — Z800 Family history of malignant neoplasm of digestive organs: Secondary | ICD-10-CM | POA: Diagnosis not present

## 2018-06-20 HISTORY — DX: Other complications of anesthesia, initial encounter: T88.59XA

## 2018-06-20 HISTORY — DX: Other specified postprocedural states: Z98.890

## 2018-06-20 HISTORY — DX: Nausea with vomiting, unspecified: R11.2

## 2018-06-20 HISTORY — DX: Cardiac arrhythmia, unspecified: I49.9

## 2018-06-20 HISTORY — DX: Acute myocardial infarction, unspecified: I21.9

## 2018-06-20 HISTORY — DX: Adverse effect of unspecified anesthetic, initial encounter: T41.45XA

## 2018-06-20 LAB — TYPE AND SCREEN
ABO/RH(D): AB POS
Antibody Screen: NEGATIVE

## 2018-06-20 LAB — CBC
HEMATOCRIT: 42.1 % (ref 36.0–46.0)
HEMOGLOBIN: 13.2 g/dL (ref 12.0–15.0)
MCH: 26.5 pg (ref 26.0–34.0)
MCHC: 31.4 g/dL (ref 30.0–36.0)
MCV: 84.5 fL (ref 80.0–100.0)
NRBC: 0 % (ref 0.0–0.2)
Platelets: 239 10*3/uL (ref 150–400)
RBC: 4.98 MIL/uL (ref 3.87–5.11)
RDW: 19 % — ABNORMAL HIGH (ref 11.5–15.5)
WBC: 8.5 10*3/uL (ref 4.0–10.5)

## 2018-06-20 LAB — BASIC METABOLIC PANEL
Anion gap: 7 (ref 5–15)
BUN: 15 mg/dL (ref 8–23)
CHLORIDE: 109 mmol/L (ref 98–111)
CO2: 25 mmol/L (ref 22–32)
Calcium: 9 mg/dL (ref 8.9–10.3)
Creatinine, Ser: 0.94 mg/dL (ref 0.44–1.00)
GFR calc Af Amer: >60 mL/min (ref >60)
GFR calc non Af Amer: 58 mL/min — ABNORMAL LOW (ref >60)
GLUCOSE: 115 mg/dL — AB (ref 70–99)
POTASSIUM: 3.3 mmol/L — AB (ref 3.5–5.1)
Sodium: 141 mmol/L (ref 135–145)

## 2018-06-20 NOTE — Patient Instructions (Addendum)
  Your procedure is scheduled on: Friday June 24, 2018  Report to Same Day Surgery 2nd floor [facility] Entrance-take elevator on left to 2nd floor.  Check in with surgery information desk.) To find out your arrival time, call [phone] 1:00-3:00 PM on Thursday June 23, 2018  Remember: Instructions that are not followed completely may result in serious medical risk, up to and including death, or upon the discretion of your surgeon and anesthesiologist your surgery may need to be rescheduled.    __x__ 1. Do not eat food (including mints, candies, chewing gum) after midnight the night before your procedure. You may drink water up to 2 hours before you are scheduled to arrive at the hospital for your procedure.  Do not drink anything within 2 hours of your scheduled arrival to the hospital.    __x__ 2. No Alcohol for 24 hours before or after surgery.   __x__ 3. No Smoking or e-cigarettes for 24 hours before surgery.  Do not use any chewable tobacco products for at least 6 hours before surgery.   __x__ 4. Notify your doctor if there is any change in your medical condition (cold, fever, infections).   __x__ 5. On the morning of surgery brush your teeth with toothpaste and water.  You may rinse your mouth with mouthwash if you wish.  Do not swallow any toothpaste or mouthwash.  Please read over the following fact sheets that you were given:   [facility] Preparing for Surgery and/or MRSA Information    __x__ Use CHG Soap as directed on instruction sheet   Do not wear jewelry, make-up, hairpins, clips or nail polish on the day of surgery.  Do not wear lotions, powders, deodorant, or perfumes.   Do not shave below the face/neck 48 hours prior to surgery.   Do not bring valuables to the hospital.    [facility] is not responsible for any belongings or valuables.               Contacts, dentures or bridgework may not be worn into surgery.  Leave your suitcase in the car.  After surgery it may be brought to your room.  For patients admitted to the hospital, discharge time is determined by your treatment team.  For patients discharged on the day of surgery, you will NOT be permitted to drive yourself home.  You must have a responsible adult with you for 24 hours after surgery.  __x__ Take these medications on the morning of surgery with a SMALL OF SIP OF WATER:  1. Amlodipine (Norvasc)  2. Sertraline (Zoloft)  3. Hydrocodone (Norco/Vicodin) as needed for pain   __x__ Follow recommendations from Cardiologist, Pulmonologist or PCP regarding stopping blood thinners such as Aspirin, Coumadin, Plavix, Eliquis, Effient, Pradaxa, and Pletal.  __x__ TODAY: Avoid Anti-inflammatories such as Advil, Ibuprofen, Motrin, Aleve, Naproxen, Naprosyn, BC/Goodies powders or aspirin products. You may continue to take Tylenol and Celebrex.   __x__ TODAY: Avoid supplements until after surgery. You may continue to take Vitamin D, Vitamin B, and multivitamin.

## 2018-06-21 ENCOUNTER — Ambulatory Visit: Payer: PPO | Admitting: Certified Registered"

## 2018-06-21 ENCOUNTER — Other Ambulatory Visit: Payer: Self-pay

## 2018-06-21 ENCOUNTER — Ambulatory Visit
Admission: RE | Admit: 2018-06-21 | Discharge: 2018-06-21 | Disposition: A | Discharge status: Home / Self Care | Payer: PPO | Attending: Gastroenterology | Admitting: Gastroenterology

## 2018-06-21 ENCOUNTER — Encounter
Admission: RE | Disposition: A | Discharge status: Home / Self Care | Payer: Self-pay | Source: Home / Self Care | Attending: Gastroenterology

## 2018-06-21 DIAGNOSIS — Z9884 Bariatric surgery status: Secondary | ICD-10-CM | POA: Diagnosis not present

## 2018-06-21 DIAGNOSIS — I110 Hypertensive heart disease with heart failure: Secondary | ICD-10-CM | POA: Diagnosis not present

## 2018-06-21 DIAGNOSIS — E785 Hyperlipidemia, unspecified: Secondary | ICD-10-CM | POA: Insufficient documentation

## 2018-06-21 DIAGNOSIS — I2510 Atherosclerotic heart disease of native coronary artery without angina pectoris: Secondary | ICD-10-CM | POA: Insufficient documentation

## 2018-06-21 DIAGNOSIS — R195 Other fecal abnormalities: Secondary | ICD-10-CM | POA: Diagnosis not present

## 2018-06-21 DIAGNOSIS — Z8673 Personal history of transient ischemic attack (TIA), and cerebral infarction without residual deficits: Secondary | ICD-10-CM | POA: Insufficient documentation

## 2018-06-21 DIAGNOSIS — F329 Major depressive disorder, single episode, unspecified: Secondary | ICD-10-CM | POA: Insufficient documentation

## 2018-06-21 DIAGNOSIS — K635 Polyp of colon: Secondary | ICD-10-CM | POA: Diagnosis not present

## 2018-06-21 DIAGNOSIS — Z7901 Long term (current) use of anticoagulants: Secondary | ICD-10-CM | POA: Insufficient documentation

## 2018-06-21 DIAGNOSIS — K210 Gastro-esophageal reflux disease with esophagitis: Secondary | ICD-10-CM | POA: Insufficient documentation

## 2018-06-21 DIAGNOSIS — K5730 Diverticulosis of large intestine without perforation or abscess without bleeding: Secondary | ICD-10-CM | POA: Diagnosis not present

## 2018-06-21 DIAGNOSIS — I509 Heart failure, unspecified: Secondary | ICD-10-CM | POA: Diagnosis not present

## 2018-06-21 DIAGNOSIS — K623 Rectal prolapse: Secondary | ICD-10-CM | POA: Diagnosis not present

## 2018-06-21 DIAGNOSIS — D125 Benign neoplasm of sigmoid colon: Secondary | ICD-10-CM | POA: Diagnosis not present

## 2018-06-21 DIAGNOSIS — Z538 Procedure and treatment not carried out for other reasons: Secondary | ICD-10-CM | POA: Diagnosis not present

## 2018-06-21 DIAGNOSIS — D126 Benign neoplasm of colon, unspecified: Secondary | ICD-10-CM | POA: Insufficient documentation

## 2018-06-21 DIAGNOSIS — Z7984 Long term (current) use of oral hypoglycemic drugs: Secondary | ICD-10-CM | POA: Insufficient documentation

## 2018-06-21 DIAGNOSIS — Z955 Presence of coronary angioplasty implant and graft: Secondary | ICD-10-CM | POA: Insufficient documentation

## 2018-06-21 DIAGNOSIS — R1900 Intra-abdominal and pelvic swelling, mass and lump, unspecified site: Secondary | ICD-10-CM | POA: Insufficient documentation

## 2018-06-21 DIAGNOSIS — Z87891 Personal history of nicotine dependence: Secondary | ICD-10-CM | POA: Insufficient documentation

## 2018-06-21 DIAGNOSIS — Z800 Family history of malignant neoplasm of digestive organs: Secondary | ICD-10-CM | POA: Insufficient documentation

## 2018-06-21 DIAGNOSIS — K3189 Other diseases of stomach and duodenum: Secondary | ICD-10-CM | POA: Diagnosis not present

## 2018-06-21 DIAGNOSIS — Z7902 Long term (current) use of antithrombotics/antiplatelets: Secondary | ICD-10-CM | POA: Insufficient documentation

## 2018-06-21 DIAGNOSIS — Z79899 Other long term (current) drug therapy: Secondary | ICD-10-CM | POA: Insufficient documentation

## 2018-06-21 DIAGNOSIS — Q438 Other specified congenital malformations of intestine: Secondary | ICD-10-CM | POA: Insufficient documentation

## 2018-06-21 DIAGNOSIS — R1110 Vomiting, unspecified: Secondary | ICD-10-CM | POA: Insufficient documentation

## 2018-06-21 DIAGNOSIS — I480 Paroxysmal atrial fibrillation: Secondary | ICD-10-CM | POA: Insufficient documentation

## 2018-06-21 DIAGNOSIS — K2960 Other gastritis without bleeding: Secondary | ICD-10-CM | POA: Diagnosis not present

## 2018-06-21 DIAGNOSIS — K2950 Unspecified chronic gastritis without bleeding: Secondary | ICD-10-CM | POA: Insufficient documentation

## 2018-06-21 DIAGNOSIS — K2970 Gastritis, unspecified, without bleeding: Secondary | ICD-10-CM | POA: Diagnosis not present

## 2018-06-21 DIAGNOSIS — D509 Iron deficiency anemia, unspecified: Secondary | ICD-10-CM | POA: Diagnosis not present

## 2018-06-21 DIAGNOSIS — K2210 Ulcer of esophagus without bleeding: Secondary | ICD-10-CM | POA: Insufficient documentation

## 2018-06-21 DIAGNOSIS — K5790 Diverticulosis of intestine, part unspecified, without perforation or abscess without bleeding: Secondary | ICD-10-CM | POA: Diagnosis not present

## 2018-06-21 DIAGNOSIS — I252 Old myocardial infarction: Secondary | ICD-10-CM | POA: Diagnosis not present

## 2018-06-21 DIAGNOSIS — E119 Type 2 diabetes mellitus without complications: Secondary | ICD-10-CM | POA: Insufficient documentation

## 2018-06-21 DIAGNOSIS — F419 Anxiety disorder, unspecified: Secondary | ICD-10-CM | POA: Insufficient documentation

## 2018-06-21 DIAGNOSIS — K6389 Other specified diseases of intestine: Secondary | ICD-10-CM | POA: Diagnosis not present

## 2018-06-21 HISTORY — PX: COLONOSCOPY WITH PROPOFOL: SHX5780

## 2018-06-21 HISTORY — DX: Unspecified osteoarthritis, unspecified site: M19.90

## 2018-06-21 HISTORY — PX: ESOPHAGOGASTRODUODENOSCOPY: SHX5428

## 2018-06-21 LAB — GLUCOSE, CAPILLARY: Glucose-Capillary: 96 mg/dL (ref 70–99)

## 2018-06-21 SURGERY — EGD (ESOPHAGOGASTRODUODENOSCOPY)
Anesthesia: General

## 2018-06-21 MED ORDER — GLYCOPYRROLATE 0.2 MG/ML IJ SOLN
INTRAMUSCULAR | Status: DC | PRN
  Administered 2018-06-21: 0.2 mg via INTRAVENOUS

## 2018-06-21 MED ORDER — SODIUM CHLORIDE 0.9 % IV SOLN
INTRAVENOUS | Status: DC
  Administered 2018-06-21: 08:00:00 via INTRAVENOUS

## 2018-06-21 MED ORDER — PROPOFOL 10 MG/ML IV BOLUS
INTRAVENOUS | Status: AC
  Filled 2018-06-21: qty 40

## 2018-06-21 MED ORDER — SODIUM CHLORIDE 0.9 % IV SOLN
INTRAVENOUS | Status: DC | PRN
  Administered 2018-06-21 (×2): 50 ug via INTRAVENOUS

## 2018-06-21 MED ORDER — PROPOFOL 10 MG/ML IV BOLUS
INTRAVENOUS | Status: AC
  Filled 2018-06-21: qty 20

## 2018-06-21 MED ORDER — PROPOFOL 10 MG/ML IV BOLUS
INTRAVENOUS | Status: DC | PRN
  Administered 2018-06-21: 50 mg via INTRAVENOUS

## 2018-06-21 MED ORDER — PROPOFOL 500 MG/50ML IV EMUL
INTRAVENOUS | Status: DC | PRN
  Administered 2018-06-21: 145 ug/kg/min via INTRAVENOUS

## 2018-06-21 NOTE — Op Note (Addendum)
[facility] Gastroenterology Patient Name: Margaret [name] Procedure Date: 06/21/2018 8:53 AM MRN: [id number] Account #: [id number] Date of Birth: October 03, 1940 Admit Type: Outpatient Age: 77 Room: [facility] ENDO ROOM 3 Gender: Female Note Status: Finalized Procedure:            Colonoscopy Indications:          Heme positive stool Providers:            Lollie Sails, MD Referring MD:         Biagio Borg, MD (Referring MD) Medicines:            Monitored Anesthesia Care Complications:        No immediate complications. Procedure:            Pre-Anesthesia Assessment:                       - ASA Grade Assessment: III - A patient with severe                        systemic disease.                       After obtaining informed consent, the colonoscope was                        passed under direct vision. Throughout the procedure,                        the patient's blood pressure, pulse, and oxygen                        saturations were monitored continuously. The                        Colonoscope was introduced through the anus with the                        intention of advancing to the cecum. The scope was                        advanced to the transverse colon before the procedure                        was aborted. Medications were given. The colonoscopy                        was performed with moderate difficulty due to a                        tortuous colon. Successful completion of the procedure                        was aided by changing the patient to a supine position                        and using manual pressure. Findings:      Multiple small-mouthed diverticula were found in the sigmoid colon and       distal descending colon.      A 5 mm polyp was found in the distal sigmoid colon. The polyp was  sessile. The polyp was removed with a cold snare. Resection and       retrieval were complete.      The scope was advanced to the distal  sigmoid where patient began to       avidly wretch. The scope was brought bak into the distal colon and 2       more attempts were done to advance beyond the distal transverse. Each       time resulted in wretching. I did not go beyond the distal transverse       due to concern for aspuration from the avid wretching.      A 35 mm polyp was found at 25 cm proximal to the anus. The polyp was       pedunculated. The polyp was removed with a hot snare. Resection and       retrieval were complete. To prevent bleeding after the polypectomy, one       hemostatic clip was successfully placed (MR conditional). There was no       bleeding at the end of the maneuver. A net was necessary to retrieve       from the rectum. Impression:           - Diverticulosis in the sigmoid colon and in the distal                        descending colon.                       - One 5 mm polyp in the distal sigmoid colon, removed                        with a cold snare. Resected and retrieved.                       - One 35 mm polyp at 25 cm proximal to the anus,                        removed with a hot snare. Resected and retrieved. Clip                        (MR conditional) was placed.                       - Incomplete proceedure due to aspiration risk.                        Completion of colonoscopy can be arranged with                        intubation for airway protection. Recommendation:       - Discharge patient to home.                       - Full liquid diet for 1 day, then advance as tolerated                        to low residue diet for 2 days. Procedure Code(s):    --- Professional ---                       [phone], 36,  Colonoscopy, flexible; with removal of                        tumor(s), polyp(s), or other lesion(s) by snare                        technique Diagnosis Code(s):    --- Professional ---                       D12.5, Benign neoplasm of sigmoid colon                       R19.5,  Other fecal abnormalities                       K57.30, Diverticulosis of large intestine without                        perforation or abscess without bleeding CPT copyright 2018 American Medical Association. All rights reserved. The codes documented in this report are preliminary and upon coder review may  be revised to meet current compliance requirements. Lollie Sails, MD 06/21/2018 10:16:02 AM This report has been signed electronically. Number of Addenda: 0 Note Initiated On: 06/21/2018 8:53 AM Scope Withdrawal Time: 0 hours 24 minutes 48 seconds  Total Procedure Duration: 0 hours 39 minutes 42 seconds       [facility]

## 2018-06-21 NOTE — Op Note (Signed)
[facility] Gastroenterology Patient Name: Margaret [name] Procedure Date: 06/21/2018 8:54 AM MRN: [id number] Account #: [id number] Date of Birth: [birth year] Admit Type: Outpatient Age: 77 Room: [facility] ENDO ROOM 3 Gender: Female Note Status: Finalized Procedure:            Upper GI endoscopy Indications:          Iron deficiency anemia, Heme positive stool Providers:            Lollie Sails, MD Referring MD:         Biagio Borg, MD (Referring MD) Medicines:            Monitored Anesthesia Care Complications:        No immediate complications. Procedure:            Pre-Anesthesia Assessment:                       - ASA Grade Assessment: III - A patient with severe                        systemic disease.                       After obtaining informed consent, the endoscope was                        passed under direct vision. Throughout the procedure,                        the patient's blood pressure, pulse, and oxygen                        saturations were monitored continuously. The Endoscope                        was introduced through the mouth, and advanced to the                        jejunum. The upper GI endoscopy was accomplished                        without difficulty. The patient tolerated the procedure                        well. Findings:      LA Grade A (one or more mucosal breaks less than 5 mm, not extending       between tops of 2 mucosal folds) esophagitis with no bleeding was found.       Biopsies were taken with a cold forceps for histology.      The exam of the esophagus was otherwise normal.      Evidence of a gastric bypass was found. A gastric pouch with a 5 cm       length from the GE junction to the gastrojejunal anastomosis was found       containing suture material. The suture line appeared intact. The       gastrojejunal anastomosis was characterized by erythema and a visible       suture. The suture showed concretions on  the material, but was still       firmly attached to gentle movement. This was traversed into the jejunum.  The pouch-to-jejunum limb was characterized by healthy appearing mucosa.       Biopsies were taken with a cold forceps for histology. Mild gastritis       noted in the gastric pouch, biopsied for histology. The examined jejunum       was healthy in appearance. Impression:           - LA Grade A erosive esophagitis. Biopsied.                       - Gastric bypass with a pouch 5 cm in length and intact                        staple line. Gastrojejunal anastomosis characterized by                        erythema and visible sutures. Biopsied. Recommendation:       - Await pathology results. Procedure Code(s):    --- Professional ---                       [phone], Esophagogastroduodenoscopy, flexible, transoral;                        with biopsy, single or multiple Diagnosis Code(s):    --- Professional ---                       K20.8, Other esophagitis                       Z98.84, Bariatric surgery status                       D50.9, Iron deficiency anemia, unspecified                       R19.5, Other fecal abnormalities CPT copyright 2018 American Medical Association. All rights reserved. The codes documented in this report are preliminary and upon coder review may  be revised to meet current compliance requirements. Lollie Sails, MD 06/21/2018 9:23:53 AM This report has been signed electronically. Number of Addenda: 0 Note Initiated On: 06/21/2018 8:54 AM      [facility]

## 2018-06-21 NOTE — Anesthesia Postprocedure Evaluation (Signed)
Anesthesia Post Note  Patient: Margaret [name]  Procedure(s) Performed: ESOPHAGOGASTRODUODENOSCOPY (EGD) (N/A ) COLONOSCOPY WITH PROPOFOL (N/A )  Patient location during evaluation: Endoscopy Anesthesia Type: General Level of consciousness: awake and alert and oriented Pain management: pain level controlled Vital Signs Assessment: post-procedure vital signs reviewed and stable Respiratory status: spontaneous breathing, nonlabored ventilation and respiratory function stable Cardiovascular status: blood pressure returned to baseline and stable Postop Assessment: no signs of nausea or vomiting Anesthetic complications: no     Last Vitals:  Vitals:   06/21/18 1033 06/21/18 1053  BP: (!) 161/72 (!) 191/80  Pulse:    Resp:    Temp:    SpO2:      Last Pain:  Vitals:   06/21/18 1103  TempSrc:   PainSc: 0-No pain                 Surena Welge

## 2018-06-21 NOTE — H&P (Signed)
Outpatient short stay form Pre-procedure 06/21/2018 8:58 AM Margaret Sails MD  Primary Physician: Dr. Harrel Lemon  Reason for visit: EGD and colonoscopy  History of present illness: Patient is a 77 year old female presenting today for an EGD and colonoscopy in regards to a Hemoccult positive stool and iron deficiency anemia.  She does have a history of gastric bypass a number of years ago.  Also had a coronary stent placed about 2 years ago this occluded requiring reoperation thrombectomy and a repeat stent.  Is also recently been found on CT scanning to have a pelvic mass and is due to have a GYN surgery toward the end of this week.  Last stent placement was in December 2018.  Also family history of colon cancer primary relative and patient has not had a colonoscopy for at least 10 years.  Tolerated her prep well.  She takes no other blood thinning agent or aspirin product.  She has held both her Eliquis and Plavix for over a week.    Current Facility-Administered Medications:  .  0.9 %  sodium chloride infusion, , Intravenous, Continuous, Margaret Sails, MD, Last Rate: 20 mL/hr at 06/21/18 1448  Medications Prior to Admission  Medication Sig Dispense Refill Last Dose  . amLODipine (NORVASC) 2.5 MG tablet Take 1 tablet (2.5 mg total) by mouth daily. 90 tablet 3 06/20/2018 at 0830  . apixaban (ELIQUIS) 5 MG TABS tablet Take 1 tablet (5 mg total) by mouth 2 (two) times daily. 60 tablet 0 06/18/2018 at Unknown time  . clopidogrel (PLAVIX) 75 MG tablet Take 1 tablet (75 mg total) by mouth daily. 30 tablet 0 06/18/2018 at Unknown time  . cyanocobalamin (,VITAMIN B-12,) 1000 MCG/ML injection Inject 1,000 mcg into the muscle every 30 (thirty) days.   Past Week at Unknown time  . ergocalciferol (VITAMIN D2) 1.25 MG (50000 UT) capsule Take 50,000 Units by mouth every Monday.   Past Week at Unknown time  . ezetimibe (ZETIA) 10 MG tablet TAKE 1 TABLET(10 MG) BY MOUTH DAILY (Patient taking  differently: Take 10 mg by mouth daily. ) 30 tablet 5 Past Week at Unknown time  . HYDROcodone-acetaminophen (NORCO/VICODIN) 5-325 MG tablet Take 1 tablet by mouth at bedtime as needed for moderate pain.   0 Past Week at Unknown time  . Multiple Vitamin (MULTIVITAMIN WITH MINERALS) TABS tablet Take 1 tablet by mouth daily. 30 tablet 1 Past Week at Unknown time  . Multiple Vitamins-Minerals (OCUVITE PO) Take 1 capsule by mouth daily.   Past Week at Unknown time  . nitroGLYCERIN (NITROSTAT) 0.4 MG SL tablet Place 1 tablet (0.4 mg total) under the tongue every 5 (five) minutes as needed for chest pain. 30 tablet 2 Past Month at Unknown time  . ondansetron (ZOFRAN) 4 MG tablet Take 4 mg by mouth every 8 (eight) hours as needed for nausea or vomiting.   Past Month at Unknown time  . protein supplement shake (PREMIER PROTEIN) LIQD Take 325 mLs (11 oz total) by mouth 2 (two) times daily between meals. 60 Can 0 Past Week at Unknown time  . senna-docusate (SENNA S) 8.6-50 MG tablet Take 1 capsule by mouth daily as needed for moderate constipation.    Past Week at Unknown time  . sertraline (ZOLOFT) 100 MG tablet Take 100 mg by mouth daily.    06/20/2018 at 0830  . rosuvastatin (CRESTOR) 40 MG tablet Take 1 tablet (40 mg total) by mouth daily. (Patient not taking: Reported on 06/10/2018) 30 tablet  3 Not Taking at Unknown time     Allergies  Allergen Reactions  . Erythromycin Base Nausea And Vomiting  . Tape Itching, Rash and Other (See Comments)    Also "band-aids"     Past Medical History:  Diagnosis Date  . Anxiety and depression   . CAD (coronary artery disease)   . Cellulitis of right leg 2005  . CHF (congestive heart failure) ([place])   . Colon polyp   . Complication of anesthesia   . Diabetes mellitus without complication ([place])   . DJD (degenerative joint disease)   . Dyspnea on exertion   . Dysrhythmia    PAF  . GERD (gastroesophageal reflux disease)   . Hyperlipidemia   . Hypertension    . Morbid obesity ([place])   . Myocardial infarction ([place]) 06/14/2017  . PAF (paroxysmal atrial fibrillation) ([place])    a. diagnosed 7/19; b. CHADS2VASc 8; c. Eliquis  . PONV (postoperative nausea and vomiting)   . Presence of stent in right coronary artery 02/08/12   2.5x18 Xience distal RCA  . Right shoulder pain   . Stroke ([place]) 2017  . Vaginal prolapse     Review of systems:      Physical Exam    Heart and lungs: Regular rate and rhythm without rub or gallop, lungs are bilaterally clear.    HEENT: Normocephalic atraumatic eyes are anicteric    Other:    Pertinant exam for procedure: Soft nontender nondistended bowel sounds positive normoactive.  There is a discomfort to palpation in the left lower quadrant.    Planned proceedures: EGD, colonoscopy and indicated procedures. I have discussed the risks benefits and complications of procedures to include not limited to bleeding, infection, perforation and the risk of sedation and the patient wishes to proceed.    Margaret Sails, MD Gastroenterology 06/21/2018  8:58 AM

## 2018-06-21 NOTE — OR Nursing (Signed)
Unable to reach cecum due to pt retching,scope advanced to 70 cm.

## 2018-06-21 NOTE — Transfer of Care (Signed)
Immediate Anesthesia Transfer of Care Note  Patient: Margaret [name]  Procedure(s) Performed: ESOPHAGOGASTRODUODENOSCOPY (EGD) (N/A ) COLONOSCOPY WITH PROPOFOL (N/A )  Patient Location: PACU  Anesthesia Type:General  Level of Consciousness: drowsy  Airway & Oxygen Therapy: Patient Spontanous Breathing and Patient connected to nasal cannula oxygen  Post-op Assessment: Report given to RN  Post vital signs: Reviewed and stable  Last Vitals:  Vitals Value Taken Time  BP 110/63 06/21/2018 10:13 AM  Temp 36.3 C 06/21/2018 10:13 AM  Pulse 64 06/21/2018 10:16 AM  Resp 10 06/21/2018 10:16 AM  SpO2 99 % 06/21/2018 10:16 AM  Vitals shown include unvalidated device data.  Last Pain:  Vitals:   06/21/18 1013  TempSrc: Tympanic  PainSc: Asleep         Complications: No apparent anesthesia complications

## 2018-06-21 NOTE — Anesthesia Post-op Follow-up Note (Signed)
Anesthesia QCDR form completed.        

## 2018-06-21 NOTE — Anesthesia Preprocedure Evaluation (Signed)
Anesthesia Evaluation  Patient identified by MRN, date of birth, ID band Patient awake    Reviewed: Allergy & Precautions, NPO status , Patient's Chart, lab work & pertinent test results  History of Anesthesia Complications (+) PONV and history of anesthetic complications  Airway Mallampati: II  TM Distance: >3 FB Neck ROM: Full    Dental  (+) Upper Dentures, Lower Dentures   Pulmonary neg sleep apnea, neg COPD, former smoker,    breath sounds clear to auscultation- rhonchi (-) wheezing      Cardiovascular Exercise Tolerance: Good hypertension, Pt. on medications + CAD, + Past MI, + Cardiac Stents (05/2017) and +CHF  + dysrhythmias (PAF) Atrial Fibrillation  Rhythm:Regular Rate:Normal - Systolic murmurs and - Diastolic murmurs    Neuro/Psych neg Seizures PSYCHIATRIC DISORDERS Anxiety Depression CVA (visual symptoms)    GI/Hepatic Neg liver ROS, GERD  ,  Endo/Other  diabetes, Oral Hypoglycemic Agents  Renal/GU negative Renal ROS     Musculoskeletal  (+) Arthritis ,   Abdominal (+) - obese,   Peds  Hematology negative hematology ROS (+)   Anesthesia Other Findings Past Medical History: No date: Anxiety and depression No date: CAD (coronary artery disease) 2005: Cellulitis of right leg No date: CHF (congestive heart failure) (H[facility]) No date: Colon polyp No date: Complication of anesthesia No date: Diabetes mellitus without complication (H[facility]) No date: DJD (degenerative joint disease) No date: Dyspnea on exertion No date: Dysrhythmia     Comment:  PAF No date: GERD (gastroesophageal reflux disease) No date: Hyperlipidemia No date: Hypertension No date: Morbid obesity ([place]) 06/14/2017: Myocardial infarction ([place]) No date: PAF (paroxysmal atrial fibrillation) ([place])     Comment:  a. diagnosed 7/19; b. CHADS2VASc 8; c. Eliquis No date: PONV (postoperative nausea and vomiting) 02/08/12: Presence of stent in right  coronary artery     Comment:  2.5x18 Xience distal RCA No date: Right shoulder pain 2017: Stroke ([place]) No date: Vaginal prolapse   Reproductive/Obstetrics                             Anesthesia Physical Anesthesia Plan  ASA: III  Anesthesia Plan: General   Post-op Pain Management:    Induction: Intravenous  PONV Risk Score and Plan: 2 and Propofol infusion  Airway Management Planned: Natural Airway  Additional Equipment:   Intra-op Plan:   Post-operative Plan:   Informed Consent: I have reviewed the patients History and Physical, chart, labs and discussed the procedure including the risks, benefits and alternatives for the proposed anesthesia with the patient or authorized representative who has indicated his/her understanding and acceptance.   Dental advisory given  Plan Discussed with: CRNA and Anesthesiologist  Anesthesia Plan Comments:         Anesthesia Quick Evaluation

## 2018-06-23 ENCOUNTER — Encounter: Payer: Self-pay | Admitting: Gastroenterology

## 2018-06-23 LAB — SURGICAL PATHOLOGY

## 2018-06-24 ENCOUNTER — Ambulatory Visit: Payer: PPO | Admitting: Certified Registered"

## 2018-06-24 ENCOUNTER — Other Ambulatory Visit: Payer: Self-pay

## 2018-06-24 ENCOUNTER — Ambulatory Visit
Admission: RE | Admit: 2018-06-24 | Discharge: 2018-06-24 | Disposition: A | Discharge status: Home / Self Care | Payer: PPO | Attending: Obstetrics and Gynecology | Admitting: Obstetrics and Gynecology

## 2018-06-24 ENCOUNTER — Encounter: Payer: Self-pay | Admitting: Emergency Medicine

## 2018-06-24 ENCOUNTER — Encounter
Admission: RE | Disposition: A | Discharge status: Home / Self Care | Payer: Self-pay | Source: Home / Self Care | Attending: Obstetrics and Gynecology

## 2018-06-24 DIAGNOSIS — Z955 Presence of coronary angioplasty implant and graft: Secondary | ICD-10-CM | POA: Diagnosis not present

## 2018-06-24 DIAGNOSIS — D271 Benign neoplasm of left ovary: Secondary | ICD-10-CM | POA: Diagnosis not present

## 2018-06-24 DIAGNOSIS — Z6827 Body mass index (BMI) 27.0-27.9, adult: Secondary | ICD-10-CM | POA: Insufficient documentation

## 2018-06-24 DIAGNOSIS — I480 Paroxysmal atrial fibrillation: Secondary | ICD-10-CM | POA: Diagnosis not present

## 2018-06-24 DIAGNOSIS — C561 Malignant neoplasm of right ovary: Secondary | ICD-10-CM | POA: Insufficient documentation

## 2018-06-24 DIAGNOSIS — I252 Old myocardial infarction: Secondary | ICD-10-CM | POA: Diagnosis not present

## 2018-06-24 DIAGNOSIS — I509 Heart failure, unspecified: Secondary | ICD-10-CM | POA: Diagnosis not present

## 2018-06-24 DIAGNOSIS — F418 Other specified anxiety disorders: Secondary | ICD-10-CM | POA: Diagnosis not present

## 2018-06-24 DIAGNOSIS — Z79899 Other long term (current) drug therapy: Secondary | ICD-10-CM | POA: Insufficient documentation

## 2018-06-24 DIAGNOSIS — F419 Anxiety disorder, unspecified: Secondary | ICD-10-CM | POA: Diagnosis not present

## 2018-06-24 DIAGNOSIS — I110 Hypertensive heart disease with heart failure: Secondary | ICD-10-CM | POA: Diagnosis not present

## 2018-06-24 DIAGNOSIS — Z7984 Long term (current) use of oral hypoglycemic drugs: Secondary | ICD-10-CM | POA: Diagnosis not present

## 2018-06-24 DIAGNOSIS — C562 Malignant neoplasm of left ovary: Secondary | ICD-10-CM | POA: Insufficient documentation

## 2018-06-24 DIAGNOSIS — N838 Other noninflammatory disorders of ovary, fallopian tube and broad ligament: Secondary | ICD-10-CM | POA: Diagnosis not present

## 2018-06-24 DIAGNOSIS — N83291 Other ovarian cyst, right side: Secondary | ICD-10-CM | POA: Diagnosis not present

## 2018-06-24 DIAGNOSIS — Z7902 Long term (current) use of antithrombotics/antiplatelets: Secondary | ICD-10-CM | POA: Diagnosis not present

## 2018-06-24 DIAGNOSIS — Z7901 Long term (current) use of anticoagulants: Secondary | ICD-10-CM | POA: Diagnosis not present

## 2018-06-24 DIAGNOSIS — N801 Endometriosis of ovary: Secondary | ICD-10-CM | POA: Diagnosis not present

## 2018-06-24 DIAGNOSIS — Z87891 Personal history of nicotine dependence: Secondary | ICD-10-CM | POA: Insufficient documentation

## 2018-06-24 DIAGNOSIS — N83201 Unspecified ovarian cyst, right side: Secondary | ICD-10-CM | POA: Diagnosis not present

## 2018-06-24 DIAGNOSIS — Z8673 Personal history of transient ischemic attack (TIA), and cerebral infarction without residual deficits: Secondary | ICD-10-CM | POA: Insufficient documentation

## 2018-06-24 DIAGNOSIS — D270 Benign neoplasm of right ovary: Secondary | ICD-10-CM | POA: Diagnosis not present

## 2018-06-24 DIAGNOSIS — E785 Hyperlipidemia, unspecified: Secondary | ICD-10-CM | POA: Diagnosis not present

## 2018-06-24 DIAGNOSIS — E119 Type 2 diabetes mellitus without complications: Secondary | ICD-10-CM | POA: Diagnosis not present

## 2018-06-24 DIAGNOSIS — Z9884 Bariatric surgery status: Secondary | ICD-10-CM | POA: Insufficient documentation

## 2018-06-24 DIAGNOSIS — Z881 Allergy status to other antibiotic agents status: Secondary | ICD-10-CM | POA: Insufficient documentation

## 2018-06-24 DIAGNOSIS — F329 Major depressive disorder, single episode, unspecified: Secondary | ICD-10-CM | POA: Diagnosis not present

## 2018-06-24 DIAGNOSIS — N736 Female pelvic peritoneal adhesions (postinfective): Secondary | ICD-10-CM | POA: Diagnosis not present

## 2018-06-24 DIAGNOSIS — K219 Gastro-esophageal reflux disease without esophagitis: Secondary | ICD-10-CM | POA: Insufficient documentation

## 2018-06-24 DIAGNOSIS — I2510 Atherosclerotic heart disease of native coronary artery without angina pectoris: Secondary | ICD-10-CM | POA: Insufficient documentation

## 2018-06-24 DIAGNOSIS — K660 Peritoneal adhesions (postprocedural) (postinfection): Secondary | ICD-10-CM | POA: Insufficient documentation

## 2018-06-24 DIAGNOSIS — Z8249 Family history of ischemic heart disease and other diseases of the circulatory system: Secondary | ICD-10-CM | POA: Insufficient documentation

## 2018-06-24 HISTORY — PX: LAPAROSCOPIC BILATERAL SALPINGO OOPHERECTOMY: SHX5890

## 2018-06-24 HISTORY — PX: LYSIS OF ADHESION: SHX5961

## 2018-06-24 LAB — POCT I-STAT 4, (NA,K, GLUC, HGB,HCT)
GLUCOSE: 118 mg/dL — AB (ref 70–99)
HCT: 38 % (ref 36.0–46.0)
Hemoglobin: 12.9 g/dL (ref 12.0–15.0)
Potassium: 4 mmol/L (ref 3.5–5.1)
Sodium: 144 mmol/L (ref 135–145)

## 2018-06-24 LAB — ABO/RH: ABO/RH(D): AB POS

## 2018-06-24 LAB — GLUCOSE, CAPILLARY: Glucose-Capillary: 157 mg/dL — ABNORMAL HIGH (ref 70–99)

## 2018-06-24 SURGERY — SALPINGO-OOPHORECTOMY, BILATERAL, LAPAROSCOPIC
Anesthesia: General | Laterality: Bilateral

## 2018-06-24 MED ORDER — SODIUM CHLORIDE FLUSH 0.9 % IV SOLN
INTRAVENOUS | Status: AC
  Filled 2018-06-24: qty 10

## 2018-06-24 MED ORDER — PROPOFOL 10 MG/ML IV BOLUS
INTRAVENOUS | Status: AC
  Filled 2018-06-24: qty 20

## 2018-06-24 MED ORDER — MIDAZOLAM HCL 2 MG/2ML IJ SOLN
INTRAMUSCULAR | Status: DC | PRN
  Administered 2018-06-24 (×2): 1 mg via INTRAVENOUS

## 2018-06-24 MED ORDER — MEPERIDINE HCL 50 MG/ML IJ SOLN: 6.2500 mg | INTRAMUSCULAR | Status: DC | PRN

## 2018-06-24 MED ORDER — BUPIVACAINE HCL 0.5 % IJ SOLN
INTRAMUSCULAR | Status: DC | PRN
  Administered 2018-06-24: 8 mL

## 2018-06-24 MED ORDER — SUGAMMADEX SODIUM 200 MG/2ML IV SOLN
INTRAVENOUS | Status: DC | PRN
  Administered 2018-06-24: 200 mg via INTRAVENOUS

## 2018-06-24 MED ORDER — OXYCODONE HCL 5 MG/5ML PO SOLN: 5.0000 mg | Freq: Once | ORAL | Status: DC | PRN

## 2018-06-24 MED ORDER — ONDANSETRON HCL 4 MG/2ML IJ SOLN
INTRAMUSCULAR | Status: AC
  Filled 2018-06-24: qty 2

## 2018-06-24 MED ORDER — SUGAMMADEX SODIUM 200 MG/2ML IV SOLN
INTRAVENOUS | Status: AC
  Filled 2018-06-24: qty 2

## 2018-06-24 MED ORDER — SOD CITRATE-CITRIC ACID 500-334 MG/5ML PO SOLN
30.0000 mL | ORAL | Status: DC
  Filled 2018-06-24: qty 30

## 2018-06-24 MED ORDER — ACETAMINOPHEN 10 MG/ML IV SOLN
INTRAVENOUS | Status: DC | PRN
  Administered 2018-06-24: 1000 mg via INTRAVENOUS

## 2018-06-24 MED ORDER — FENTANYL CITRATE (PF) 100 MCG/2ML IJ SOLN
INTRAMUSCULAR | Status: DC | PRN
  Administered 2018-06-24 (×4): 50 ug via INTRAVENOUS

## 2018-06-24 MED ORDER — DEXAMETHASONE SODIUM PHOSPHATE 10 MG/ML IJ SOLN
INTRAMUSCULAR | Status: AC
  Filled 2018-06-24: qty 1

## 2018-06-24 MED ORDER — HYDRALAZINE HCL 20 MG/ML IJ SOLN
10.0000 mg | Freq: Once | INTRAMUSCULAR | Status: AC
  Administered 2018-06-24: 10 mg via INTRAVENOUS

## 2018-06-24 MED ORDER — PROPOFOL 10 MG/ML IV BOLUS
INTRAVENOUS | Status: DC | PRN
  Administered 2018-06-24: 130 mg via INTRAVENOUS
  Administered 2018-06-24: 30 mg via INTRAVENOUS

## 2018-06-24 MED ORDER — ONDANSETRON HCL 4 MG/2ML IJ SOLN
INTRAMUSCULAR | Status: DC | PRN
  Administered 2018-06-24: 4 mg via INTRAVENOUS

## 2018-06-24 MED ORDER — LIDOCAINE HCL (CARDIAC) PF 100 MG/5ML IV SOSY
PREFILLED_SYRINGE | INTRAVENOUS | Status: DC | PRN
  Administered 2018-06-24: 100 mg via INTRAVENOUS

## 2018-06-24 MED ORDER — DEXAMETHASONE SODIUM PHOSPHATE 10 MG/ML IJ SOLN
INTRAMUSCULAR | Status: DC | PRN
  Administered 2018-06-24: 10 mg via INTRAVENOUS

## 2018-06-24 MED ORDER — ACETAMINOPHEN 10 MG/ML IV SOLN
INTRAVENOUS | Status: AC
  Filled 2018-06-24: qty 100

## 2018-06-24 MED ORDER — PROMETHAZINE HCL 25 MG/ML IJ SOLN
6.2500 mg | INTRAMUSCULAR | Status: DC | PRN
  Administered 2018-06-24: 6.25 mg via INTRAVENOUS

## 2018-06-24 MED ORDER — LIDOCAINE HCL (PF) 2 % IJ SOLN
INTRAMUSCULAR | Status: AC
  Filled 2018-06-24: qty 10

## 2018-06-24 MED ORDER — ROCURONIUM BROMIDE 50 MG/5ML IV SOLN
INTRAVENOUS | Status: AC
  Filled 2018-06-24: qty 1

## 2018-06-24 MED ORDER — HYDRALAZINE HCL 20 MG/ML IJ SOLN
INTRAMUSCULAR | Status: DC | PRN
  Administered 2018-06-24 (×2): 5 mg via INTRAVENOUS

## 2018-06-24 MED ORDER — ROCURONIUM BROMIDE 100 MG/10ML IV SOLN
INTRAVENOUS | Status: DC | PRN
  Administered 2018-06-24: 50 mg via INTRAVENOUS

## 2018-06-24 MED ORDER — LACTATED RINGERS IV SOLN
INTRAVENOUS | Status: DC
  Administered 2018-06-24: 07:00:00 via INTRAVENOUS

## 2018-06-24 MED ORDER — HYDRALAZINE HCL 20 MG/ML IJ SOLN
INTRAMUSCULAR | Status: AC
  Filled 2018-06-24: qty 1

## 2018-06-24 MED ORDER — BUPIVACAINE HCL (PF) 0.5 % IJ SOLN
INTRAMUSCULAR | Status: AC
  Filled 2018-06-24: qty 30

## 2018-06-24 MED ORDER — PROMETHAZINE HCL 25 MG/ML IJ SOLN
INTRAMUSCULAR | Status: AC
  Filled 2018-06-24: qty 1

## 2018-06-24 MED ORDER — MIDAZOLAM HCL 2 MG/2ML IJ SOLN
INTRAMUSCULAR | Status: AC
  Filled 2018-06-24: qty 2

## 2018-06-24 MED ORDER — EPHEDRINE SULFATE 50 MG/ML IJ SOLN
INTRAMUSCULAR | Status: AC
  Filled 2018-06-24: qty 1

## 2018-06-24 MED ORDER — FENTANYL CITRATE (PF) 100 MCG/2ML IJ SOLN
INTRAMUSCULAR | Status: AC
  Filled 2018-06-24: qty 2

## 2018-06-24 MED ORDER — FENTANYL CITRATE (PF) 100 MCG/2ML IJ SOLN: 25.0000 ug | INTRAMUSCULAR | Status: DC | PRN

## 2018-06-24 MED ORDER — EPHEDRINE SULFATE 50 MG/ML IJ SOLN
INTRAMUSCULAR | Status: DC | PRN
  Administered 2018-06-24: 10 mg via INTRAVENOUS

## 2018-06-24 MED ORDER — OXYCODONE HCL 5 MG PO TABS: 5.0000 mg | ORAL_TABLET | Freq: Once | ORAL | Status: DC | PRN

## 2018-06-24 SURGICAL SUPPLY — 43 items
ANCHOR TIS RET SYS 235ML (MISCELLANEOUS) ×4 IMPLANT
BAG URINE DRAINAGE (UROLOGICAL SUPPLIES) ×4 IMPLANT
BLADE SURG SZ11 CARB STEEL (BLADE) ×4 IMPLANT
CANISTER SUCT 1200ML W/VALVE (MISCELLANEOUS) ×4 IMPLANT
CATH ROBINSON RED A/P 16FR (CATHETERS) ×4 IMPLANT
CHLORAPREP W/TINT 26ML (MISCELLANEOUS) ×4 IMPLANT
CLOSURE WOUND 1/4X4 (GAUZE/BANDAGES/DRESSINGS) ×1
COVER WAND RF STERILE (DRAPES) ×4 IMPLANT
DERMABOND ADVANCED (GAUZE/BANDAGES/DRESSINGS) ×2
DERMABOND ADVANCED .7 DNX12 (GAUZE/BANDAGES/DRESSINGS) ×2 IMPLANT
DRSG TEGADERM 2-3/8X2-3/4 SM (GAUZE/BANDAGES/DRESSINGS) ×16 IMPLANT
GAUZE SPONGE 4X4 12PLY STRL (GAUZE/BANDAGES/DRESSINGS) ×4 IMPLANT
GLOVE BIO SURGEON STRL SZ8 (GLOVE) ×8 IMPLANT
GOWN STRL REUS W/ TWL LRG LVL3 (GOWN DISPOSABLE) ×4 IMPLANT
GOWN STRL REUS W/ TWL XL LVL3 (GOWN DISPOSABLE) ×2 IMPLANT
GOWN STRL REUS W/TWL LRG LVL3 (GOWN DISPOSABLE) ×4
GOWN STRL REUS W/TWL XL LVL3 (GOWN DISPOSABLE) ×2
GRASPER SUT TROCAR 14GX15 (MISCELLANEOUS) ×8 IMPLANT
IRRIGATION STRYKERFLOW (MISCELLANEOUS) IMPLANT
IRRIGATOR STRYKERFLOW (MISCELLANEOUS)
IV NS 1000ML (IV SOLUTION) ×2
IV NS 1000ML BAXH (IV SOLUTION) ×2 IMPLANT
KIT TURNOVER CYSTO (KITS) ×4 IMPLANT
LABEL OR SOLS (LABEL) ×4 IMPLANT
NS IRRIG 500ML POUR BTL (IV SOLUTION) ×4 IMPLANT
PACK GYN LAPAROSCOPIC (MISCELLANEOUS) ×4 IMPLANT
PAD OB MATERNITY 4.3X12.25 (PERSONAL CARE ITEMS) ×4 IMPLANT
PAD PREP 24X41 OB/GYN DISP (PERSONAL CARE ITEMS) ×4 IMPLANT
POUCH SPECIMEN RETRIEVAL 10MM (ENDOMECHANICALS) IMPLANT
SHEARS HARMONIC ACE PLUS 36CM (ENDOMECHANICALS) ×4 IMPLANT
SPONGE GAUZE 2X2 8PLY STER LF (GAUZE/BANDAGES/DRESSINGS) ×4
SPONGE GAUZE 2X2 8PLY STRL LF (GAUZE/BANDAGES/DRESSINGS) ×12 IMPLANT
STRIP CLOSURE SKIN 1/4X4 (GAUZE/BANDAGES/DRESSINGS) ×3 IMPLANT
SUT CHROMIC 3 0 SH 27 (SUTURE) ×4 IMPLANT
SUT VIC AB 0 CT1 36 (SUTURE) ×4 IMPLANT
SUT VIC AB 2-0 UR6 27 (SUTURE) ×4 IMPLANT
SUT VIC AB 4-0 SH 27 (SUTURE) ×4
SUT VIC AB 4-0 SH 27XANBCTRL (SUTURE) ×4 IMPLANT
SWABSTK COMLB BENZOIN TINCTURE (MISCELLANEOUS) ×4 IMPLANT
TROCAR ENDO BLADELESS 11MM (ENDOMECHANICALS) ×4 IMPLANT
TROCAR XCEL NON-BLD 5MMX100MML (ENDOMECHANICALS) ×8 IMPLANT
TROCAR XCEL UNIV SLVE 11M 100M (ENDOMECHANICALS) IMPLANT
TUBING INSUFFLATION (TUBING) ×4 IMPLANT

## 2018-06-24 NOTE — Op Note (Signed)
Margaret [name], Margaret [name] MEDICAL RECORD ZM:[id number] ACCOUNT [id number] DATE OF BIRTH:1941/02/13 FACILITY: [facility] LOCATION: [facility] PHYSICIAN:THOMAS Josefine Class, MD  OPERATIVE REPORT  DATE OF PROCEDURE:  06/24/2018  PREOPERATIVE DIAGNOSES:  Complex right ovarian cyst.  POSTOPERATIVE DIAGNOSES:  Complex right ovarian cyst.  PROCEDURE: 1.  Laparoscopic bilateral salpingo-oophorectomy. 2.  Lysis of adhesions.  ANESTHESIA:  General endotracheal anesthesia.  SURGEON:  Laverta Baltimore, MD  FIRST ASSISTANT:  Larey Days, MD  INDICATIONS:  This is a 78 year old female with abnormal right ovarian cyst measuring 5.5 x 3.2 x 4.3 cm with thickened septations and nodularity at the base.  DESCRIPTION OF PROCEDURE:  After adequate general endotracheal anesthesia, the patient was placed in dorsal supine position, legs placed in the [place].  The patient's abdomen, perineum and vagina were prepped and draped in normal sterile fashion.   Timeout was performed.  Straight catheterization of the bladder yielded 50 mL of clear urine.  A sponge stick was placed in the vagina to be used for vaginal manipulation during the procedure.  Gloves were changed.  Attention was directed to the  patient's abdomen where a 5 mm infraumbilical incision was made after injecting 0.5% Marcaine.  The 5 mm scope was advanced into the abdominal cavity without difficulty.  There were several omental adhesions close to the insertion site.  The patient's  abdomen was insufflated, and the patient was placed in Trendelenburg.  A second trocar was placed in the left lower quadrant 3 cm medial to the left anterior iliac spine.  An 11 mm trocar was advanced under direct visualization.  A third port site was  placed in the right lower quadrant, and 5 mm port was advanced under direct visualization 3 cm and medial to the right anterior iliac spine.  Initial impression showed the omental adhesions and adhesions from the  bowel to the vaginal cuff.  The left tube  and ovary appeared normal.  The ureter was not identified prior to starting the left-sided dissection.  The right ureter was identified with normal peristaltic activity.  The left infundibulopelvic ligament was cauterized with the Kleppinger cautery,  and a Harmonic scalpel was used to dissect the left tube and ovary.  Good hemostasis was noted.  This left tube and ovary were brought out separately with an Endobag.  This will be sent to pathology separately.  Cannula was replaced.  Attention was  directed to the patient's right side.  The infundibulopelvic ligament was cauterized, transected.  Given the size of the multicystic ovarian mass, meticulous dissection of the ovary off the sidewall ensued, and ultimately the ovary with cysts were  removed intact, placed in a bag and brought through the left lower port site.  Good hemostasis was noted.  The intraabdominal pressure was lowered.  Good hemostasis was noted at the end of the case.  Again, the left ureter peristalsis was not identified  as it was not identified prior to dissecting on that side.  The right ureter was identified after dissection and normal peristaltic activity.  The patient's abdomen was deflated.  The left lower port site's fascia was closed with a running 2-0 Vicryl  suture.  Given the depth and the length of the incision, the subcutaneous tissue was reapproximated with a 3-0 chromic suture, and all skin incisions were closed with interrupted 4-0 Vicryl and sterile dressings were applied.  Sponge stick was removed  from the vagina.  COMPLICATIONS:  There were no complications.  ESTIMATED BLOOD LOSS:  5 mL.  INTRAOPERATIVE FLUIDS:  700 mL.  URINE OUTPUT:  50 mL.  The patient tolerated the procedure well and was taken to recovery room in good condition.  LN/NUANCE  D:06/24/2018 T:06/24/2018 JOB:[id number]

## 2018-06-24 NOTE — Transfer of Care (Signed)
Immediate Anesthesia Transfer of Care Note  Patient: Margaret [name]  Procedure(s) Performed: LAPAROSCOPIC BILATERAL SALPINGO OOPHORECTOMY (Bilateral ) LYSIS OF ADHESION  Patient Location: PACU  Anesthesia Type:General  Level of Consciousness: awake and drowsy  Airway & Oxygen Therapy: Patient Spontanous Breathing and Patient connected to face mask oxygen  Post-op Assessment: Report given to RN and Post -op Vital signs reviewed and stable  Post vital signs: stable  Last Vitals:  Vitals Value Taken Time  BP 184/62 06/24/2018  9:27 AM  Temp    Pulse 60 06/24/2018  9:30 AM  Resp 28 06/24/2018  9:30 AM  SpO2 100 % 06/24/2018  9:30 AM  Vitals shown include unvalidated device data.  Last Pain:  Vitals:   06/24/18 0625  TempSrc: Tympanic  PainSc: 0-No pain         Complications: No apparent anesthesia complications

## 2018-06-24 NOTE — Anesthesia Preprocedure Evaluation (Signed)
Anesthesia Evaluation  Patient identified by MRN, date of birth, ID band Patient awake    Reviewed: Allergy & Precautions, NPO status , Patient's Chart, lab work & pertinent test results  History of Anesthesia Complications (+) PONV and history of anesthetic complications  Airway Mallampati: II  TM Distance: >3 FB Neck ROM: Full    Dental  (+) Upper Dentures, Lower Dentures   Pulmonary neg sleep apnea, neg COPD, former smoker,    breath sounds clear to auscultation- rhonchi (-) wheezing      Cardiovascular Exercise Tolerance: Good hypertension, Pt. on medications + CAD, + Past MI, + Cardiac Stents (05/2017) and +CHF  + dysrhythmias (PAF) Atrial Fibrillation  Rhythm:Regular Rate:Normal - Systolic murmurs and - Diastolic murmurs    Neuro/Psych neg Seizures PSYCHIATRIC DISORDERS Anxiety Depression CVA (visual symptoms)    GI/Hepatic Neg liver ROS, GERD  ,  Endo/Other  diabetes, Oral Hypoglycemic Agents  Renal/GU negative Renal ROS     Musculoskeletal  (+) Arthritis ,   Abdominal (+) - obese,   Peds  Hematology negative hematology ROS (+)   Anesthesia Other Findings Past Medical History: No date: Anxiety and depression No date: CAD (coronary artery disease) 2005: Cellulitis of right leg No date: CHF (congestive heart failure) (HCC) No date: Colon polyp No date: Complication of anesthesia No date: Diabetes mellitus without complication (HCC) No date: DJD (degenerative joint disease) No date: Dyspnea on exertion No date: Dysrhythmia     Comment:  PAF No date: GERD (gastroesophageal reflux disease) No date: Hyperlipidemia No date: Hypertension No date: Morbid obesity ([place]) 06/14/2017: Myocardial infarction ([place]) No date: PAF (paroxysmal atrial fibrillation) ([place])     Comment:  a. diagnosed 7/19; b. CHADS2VASc 8; c. Eliquis No date: PONV (postoperative nausea and vomiting) 02/08/12: Presence of stent in right  coronary artery     Comment:  2.5x18 Xience distal RCA No date: Right shoulder pain 2017: Stroke ([place]) No date: Vaginal prolapse   Reproductive/Obstetrics                             Anesthesia Physical Anesthesia Plan  ASA: III  Anesthesia Plan: General   Post-op Pain Management:    Induction: Intravenous  PONV Risk Score and Plan: 3 and Ondansetron, Dexamethasone, Midazolam and Treatment may vary due to age or medical condition  Airway Management Planned: Oral ETT  Additional Equipment:   Intra-op Plan:   Post-operative Plan: Extubation in OR  Informed Consent: I have reviewed the patients History and Physical, chart, labs and discussed the procedure including the risks, benefits and alternatives for the proposed anesthesia with the patient or authorized representative who has indicated his/her understanding and acceptance.   Dental advisory given  Plan Discussed with: CRNA and Anesthesiologist  Anesthesia Plan Comments:        Anesthesia Quick Evaluation

## 2018-06-24 NOTE — Anesthesia Post-op Follow-up Note (Signed)
Anesthesia QCDR form completed.        

## 2018-06-24 NOTE — Brief Op Note (Signed)
06/24/2018  9:12 AM  PATIENT:  Margaret [name]  78 y.o. female  PRE-OPERATIVE DIAGNOSIS:  complex right ovarian cyst  POST-OPERATIVE DIAGNOSIS:  complex right ovarian cyst  PROCEDURE:  Procedure(s): LAPAROSCOPIC BILATERAL SALPINGO OOPHORECTOMY (Bilateral) LYSIS OF ADHESION  SURGEON:  Surgeon(s) and Role:    * Deneen Slager, Gwen Her, MD - Primary    * Ward, Honor Loh, MD - Assisting  PHYSICIAN ASSISTANT:   ASSISTANTS: none   ANESTHESIA:   general  EBL:  5 mL   BLOOD ADMINISTERED:none  DRAINS: none   LOCAL MEDICATIONS USED:  MARCAINE     SPECIMEN:  Source of Specimen:  bilateral tubes and ovaries   DISPOSITION OF SPECIMEN:  PATHOLOGY  COUNTS:  YES  TOURNIQUET:  * No tourniquets in log *  DICTATION: .Other Dictation: Dictation Number verbal  PLAN OF CARE: Discharge to home after PACU  PATIENT DISPOSITION:  PACU - hemodynamically stable.   Delay start of Pharmacological VTE agent (>24hrs) due to surgical blood loss or risk of bleeding: not applicable

## 2018-06-24 NOTE — Progress Notes (Signed)
Pt is ready for L/S BSO . K+ normal / All questions answered   proceed

## 2018-06-24 NOTE — Discharge Instructions (Addendum)
Bilateral Salpingo-Oophorectomy, Care After This sheet gives you information about how to care for yourself after your procedure. Your health care provider may also give you more specific instructions. If you have problems or questions, contact your health care provider. What can I expect after the procedure? After the procedure, it is common to have:  Abdominal pain.  Some occasional vaginal bleeding (spotting).  Tiredness.  Symptoms of menopause, such as hot flashes, night sweats, or mood swings. Follow these instructions at home: Incision care   Keep your incision area and your bandage (dressing) clean and dry.  Follow instructions from your health care provider about how to take care of your incision. Make sure you: ? Wash your hands with soap and water before you change your dressing. If soap and water are not available, use hand sanitizer. ? Change your dressing as told by your health care provider. ? Leave stitches (sutures), staples, skin glue, or adhesive strips in place. These skin closures may need to stay in place for 2 weeks or longer. If adhesive strip edges start to loosen and curl up, you may trim the loose edges. Do not remove adhesive strips completely unless your health care provider tells you to do that.  Check your incision area every day for signs of infection. Check for: ? Redness, swelling, or pain. ? Fluid or blood. ? Warmth. ? Pus or a bad smell. Activity  Do not drive or use heavy machinery while taking prescription pain medicine.  Do not drive for 24 hours if you received a medicine to help you relax (sedative) during your procedure.  Take frequent, short walks throughout the day. Rest when you get tired. Ask your health care provider what activities are safe for you.  Avoid activity that requires great effort. Also, avoid heavy lifting. Do not lift anything that is heavier than 10 lbs. (4.5 kg), or the limit that your health care provider tells you,  until he or she says that it is safe to do so.  Do not douche, use tampons, or have sex until your health care provider approves. General instructions  To prevent or treat constipation while you are taking prescription pain medicine, your health care provider may recommend that you: ? Drink enough fluid to keep your urine clear or pale yellow. ? Take over-the-counter or prescription medicines. ? Eat foods that are high in fiber, such as fresh fruits and vegetables, whole grains, and beans. ? Limit foods that are high in fat and processed sugars, such as fried and sweet foods.  Take over-the-counter and prescription medicines only as told by your health care provider.  Do not take baths, swim, or use a hot tub until your health care provider approves. Ask your health care provider if you can take showers. You may only be allowed to take sponge baths for bathing.  Wear compression stockings as told by your health care provider. These stockings help to prevent blood clots and reduce swelling in your legs.  Keep all follow-up visits as told by your health care provider. This is important. Contact a health care provider if:  You have pain when you urinate.  You have pus or a bad smelling discharge coming from your vagina.  You have redness, swelling, or pain around your incision.  You have fluid or blood coming from your incision.  Your incision feels warm to the touch.  You have pus or a bad smell coming from your incision.  You have a fever.  Your incision  starts to break open.  You have pain in the abdomen, and it gets worse or does not get better when you take medicine.  You develop a rash.  You develop nausea and vomiting.  You feel lightheaded. Get help right away if:  You develop pain in your chest or leg.  You become short of breath.  You faint.  You have increased bleeding from your vagina. Summary  After the procedure, it is common to have pain, bleeding in  the vagina, and symptoms of menopause.  Follow instructions from your health care provider about how to take care of your incision.  Follow instructions from your health care provider about activities and restrictions.  Check your incision every day for signs of infection and report any symptoms to your health care provider. This information is not intended to replace advice given to you by your health care provider. Make sure you discuss any questions you have with your health care provider. Document Released: 06/08/2005 Document Revised: 09/17/2016 Document Reviewed: 07/13/2016 Elsevier Interactive Patient Education  2019 [place]   1) The drugs that you were given will stay in your system until tomorrow so for the next 24 hours you should not:  A) Drive an automobile B) Make any legal decisions C) Drink any alcoholic beverage   2) You may resume regular meals tomorrow.  Today it is better to start with liquids and gradually work up to solid foods.  You may eat anything you prefer, but it is better to start with liquids, then soup and crackers, and gradually work up to solid foods.   3) Please notify your doctor immediately if you have any unusual bleeding, trouble breathing, redness and pain at the surgery site, drainage, fever, or pain not relieved by medication.    4) Additional Instructions:        Please contact your physician with any problems or Same Day Surgery at [phone], Monday through Friday 6 am to 4 pm, or [place] at [facility] number at [phone].

## 2018-06-24 NOTE — Anesthesia Postprocedure Evaluation (Signed)
Anesthesia Post Note  Patient: Margaret [name]  Procedure(s) Performed: LAPAROSCOPIC BILATERAL SALPINGO OOPHORECTOMY (Bilateral ) LYSIS OF ADHESION  Patient location during evaluation: PACU Anesthesia Type: General Level of consciousness: awake and alert and oriented Pain management: pain level controlled Vital Signs Assessment: post-procedure vital signs reviewed and stable Respiratory status: spontaneous breathing, nonlabored ventilation and respiratory function stable Cardiovascular status: blood pressure returned to baseline and stable Postop Assessment: no signs of nausea or vomiting Anesthetic complications: no     Last Vitals:  Vitals:   06/24/18 1122 06/24/18 1138  BP: (!) 155/51 (!) 145/53  Pulse: (!) 57 62  Resp: 15 16  Temp: (!) 36.2 C   SpO2: 99% 100%    Last Pain:  Vitals:   06/24/18 1122  TempSrc:   PainSc: 0-No pain                 Derian Dimalanta

## 2018-06-24 NOTE — Anesthesia Procedure Notes (Signed)
Performed by: Khair Chasteen Delores, CRNA       

## 2018-06-24 NOTE — OR Nursing (Signed)
Discharge instructions discussed with patient and daughter. Daughter voices understanding.

## 2018-06-24 NOTE — Anesthesia Procedure Notes (Signed)
Procedure Name: Intubation Date/Time: 06/24/2018 7:43 AM Performed by: Lavone Orn, CRNA Pre-anesthesia Checklist: Patient identified, Emergency Drugs available, Suction available, Patient being monitored and Timeout performed Patient Re-evaluated:Patient Re-evaluated prior to induction Oxygen Delivery Method: Circle system utilized Preoxygenation: Pre-oxygenation with 100% oxygen Induction Type: IV induction Ventilation: Mask ventilation without difficulty Laryngoscope Size: Mac and 4 Grade View: Grade I Tube type: Oral Number of attempts: 1 Airway Equipment and Method: Stylet Placement Confirmation: ETT inserted through vocal cords under direct vision,  positive ETCO2 and breath sounds checked- equal and bilateral Secured at: 21 cm Tube secured with: Tape Dental Injury: Teeth and Oropharynx as per pre-operative assessment

## 2018-06-30 DIAGNOSIS — E538 Deficiency of other specified B group vitamins: Secondary | ICD-10-CM | POA: Diagnosis not present

## 2018-07-01 DIAGNOSIS — Z86010 Personal history of colonic polyps: Secondary | ICD-10-CM | POA: Diagnosis not present

## 2018-07-01 DIAGNOSIS — D649 Anemia, unspecified: Secondary | ICD-10-CM | POA: Diagnosis not present

## 2018-07-01 DIAGNOSIS — K219 Gastro-esophageal reflux disease without esophagitis: Secondary | ICD-10-CM | POA: Diagnosis not present

## 2018-07-01 LAB — SURGICAL PATHOLOGY

## 2018-07-05 DIAGNOSIS — C562 Malignant neoplasm of left ovary: Secondary | ICD-10-CM | POA: Diagnosis not present

## 2018-07-05 DIAGNOSIS — C561 Malignant neoplasm of right ovary: Secondary | ICD-10-CM | POA: Diagnosis not present

## 2018-07-08 ENCOUNTER — Telehealth: Payer: Self-pay

## 2018-07-08 DIAGNOSIS — M1712 Unilateral primary osteoarthritis, left knee: Secondary | ICD-10-CM | POA: Diagnosis not present

## 2018-07-08 DIAGNOSIS — R911 Solitary pulmonary nodule: Secondary | ICD-10-CM

## 2018-07-08 DIAGNOSIS — C569 Malignant neoplasm of unspecified ovary: Secondary | ICD-10-CM

## 2018-07-08 NOTE — Telephone Encounter (Signed)
Received call back from Ms. Folino. Provided appointments details for 07/13/18 at 1315 to see Dr. Fransisca Connors. Directions to the cancer center given. We will also obtain CT CAP. I will call her with details once they are scheduled. All questions answered.

## 2018-07-08 NOTE — Telephone Encounter (Signed)
Referral has been received for Gyn Onc. Voicemail left with Ms. Forero to return call. Appointment has been arranged 07/13/18 at 1315 with Dr. Fransisca Connors. We will arrange for updated CT CAP per Dr. Blake Divine request.

## 2018-07-08 NOTE — Telephone Encounter (Signed)
Margaret [name] returned call. She was instructed on CT scan date/time/location/instructions and picking up prep. All questions answered.

## 2018-07-12 ENCOUNTER — Ambulatory Visit
Admission: RE | Admit: 2018-07-12 | Discharge: 2018-07-12 | Disposition: A | Discharge status: Home / Self Care | Payer: PPO | Source: Ambulatory Visit | Attending: Obstetrics and Gynecology | Admitting: Obstetrics and Gynecology

## 2018-07-12 DIAGNOSIS — R911 Solitary pulmonary nodule: Secondary | ICD-10-CM | POA: Insufficient documentation

## 2018-07-12 DIAGNOSIS — C569 Malignant neoplasm of unspecified ovary: Secondary | ICD-10-CM | POA: Diagnosis not present

## 2018-07-12 DIAGNOSIS — R918 Other nonspecific abnormal finding of lung field: Secondary | ICD-10-CM | POA: Diagnosis not present

## 2018-07-12 DIAGNOSIS — I700 Atherosclerosis of aorta: Secondary | ICD-10-CM | POA: Diagnosis not present

## 2018-07-12 HISTORY — DX: Malignant (primary) neoplasm, unspecified: C80.1

## 2018-07-12 MED ORDER — IOHEXOL 300 MG/ML  SOLN
100.0000 mL | Freq: Once | INTRAMUSCULAR | Status: AC | PRN
  Administered 2018-07-12: 100 mL via INTRAVENOUS

## 2018-07-13 ENCOUNTER — Encounter: Payer: Self-pay | Admitting: Obstetrics and Gynecology

## 2018-07-13 ENCOUNTER — Inpatient Hospital Stay: Payer: PPO | Attending: Obstetrics and Gynecology | Admitting: Obstetrics and Gynecology

## 2018-07-13 ENCOUNTER — Other Ambulatory Visit: Payer: Self-pay

## 2018-07-13 VITALS — BP 131/84 | HR 80 | Temp 98.6°F | Resp 18 | Ht 68.0 in | Wt 179.2 lb

## 2018-07-13 DIAGNOSIS — C569 Malignant neoplasm of unspecified ovary: Secondary | ICD-10-CM | POA: Insufficient documentation

## 2018-07-13 DIAGNOSIS — Z90722 Acquired absence of ovaries, bilateral: Secondary | ICD-10-CM | POA: Insufficient documentation

## 2018-07-13 DIAGNOSIS — Z87891 Personal history of nicotine dependence: Secondary | ICD-10-CM | POA: Insufficient documentation

## 2018-07-13 DIAGNOSIS — R918 Other nonspecific abnormal finding of lung field: Secondary | ICD-10-CM | POA: Diagnosis not present

## 2018-07-13 DIAGNOSIS — R911 Solitary pulmonary nodule: Secondary | ICD-10-CM

## 2018-07-13 DIAGNOSIS — Z90710 Acquired absence of both cervix and uterus: Secondary | ICD-10-CM | POA: Insufficient documentation

## 2018-07-13 NOTE — Progress Notes (Signed)
Pt has some discomfort at surgery site where the ovary was removed. No other concerns

## 2018-07-13 NOTE — Progress Notes (Signed)
Margaret [name] was presented at [facility] board today, 07/13/18. She is being seen in [place] clinic today. Recommendation will be for PET scan to further evaluate RLL nodule found on CT scan. Referral will be made to medical oncology for chemotherapy. Referral to be sent genetic counseling.

## 2018-07-13 NOTE — Progress Notes (Signed)
Chaperoned pelvic exam

## 2018-07-13 NOTE — Progress Notes (Signed)
Gynecologic Oncology Consult Visit   Referring Provider: Dr. Ouida Sills  Chief Complaint: High grade serous ovarian carcinoma  Subjective:  Margaret [name] is a 78 y.o. female who is seen in consultation from Dr. Ouida Sills for high grade serous ovarian carcinoma.   She initially presented for hematochezia, hemoccult positive stool and iron deficiency anemia to GI.  She underwent CT scan which showed incidental 4.6 cm right ovarian mass presumed to be cyst. She is s/p TVH ~ 20 years ago and was unaware that she had ovaries. She has history significant for gastric bypass, coronary stent placed and approximately 2017 requiring re-operation thrombectomy and repeat stent.  She takes Eliquis and Plavix chronically. Family history significant for brain cancer and colon cancer in mother.  She is a former smoker quit approximately 32 years ago.  02/25/2018-CT C/A/P which revealed cystic-appearing mass within the right adnexal region measuring 4.6 cm in greatest dimension.  Right lower lobe pulmonary nodule measuring 9 mm, circumscribed homogeneous low-density mass within the anterior mediastinum measuring 2.2 cm at greatest dimension possibly representative of small thymoma versus small pericardial cyst.  Cardiomegaly.  Post gastric bypass.  She was referred to Dr. Ouida Sills for evaluation.  Ultrasound in office showed right ovarian complex cyst with septations measuring 5.53 x 3.28 x 4.36 cm: Septations measuring 0.23 and 0.12 cm.  Right cyst volume 41.36 mils.  CA 125 03/29/2018 10.4 (normal)  On 06/24/2018 she underwent a L/S BSO with lysis of adhesions with Dr. Ouida Sills (Dr. Leonides Schanz assisting). Per op note, " given the size of the multicystic ovarian mass, meticulous dissection of the ovary off the sidewall ensued, and ultimately the ovary with cyst were removed intact, placed in a bag, and brought through the left lower port site.'  'This left tube and ovary were brought out separately with an  Endobag.'  DIAGNOSIS:  A. OVARY AND FALLOPIAN TUBE, LEFT; SALPINGO-OOPHORECTOMY:  OVARY  - MICROSCOPIC FOCUS OF HIGH-GRADE SEROUS CARCINOMA, SEE COMMENT.  - SEROUS CYST ADENOFIBROMA.  - MUCINOUS CYSTADENOMA WITH FOCAL PROLIFERATIVE FEATURES.  - FOCI SUGGESTIVE OF ENDOMETRIOSIS.  - FALLOPIAN TUBE WITH BENIGN PARATUBAL CYST.   B. OVARY AND FALLOPIAN TUBE, RIGHT; SALPINGO-OOPHORECTOMY:  OVARY  - THREE MICROSCOPIC FOCI OF HIGH-GRADE SEROUS CARCINOMA, SEE COMMENT.  - OVARY WITH SEROUS CYST ADENOFIBROMA.  - ENDOMETRIOSIS.  - FIBROUS SEROSAL ADHESIONS.  - FALLOPIAN TUBE WITH BENIGN PARATUBAL CYSTS.   07/12/2018- CT C/A/P W Contrast revealed mild enlargement of right lower lobe pulmonary nodule now measuring 1.2 x 0.9 cm (formerly 0.8 x 0.6 cm). Interval bilateral salpingo oophorectomy, with no findings of malignancy in the abdomen/pelvis. Stable fluid density lesion along the anterior mediastinum, most likely a pericardial cyst. Other imaging findings of potential clinical significance: Aortic Atherosclerosis, Coronary atherosclerosis, mild, cardiomegaly. Two tiny stable nodules in the right lung.  Her case was discussed at gyn-onc case conference on 07/13/2018 with review of case and pathology.   Problem List: Patient Active Problem List   Diagnosis Date Noted  . Primary high grade serous adenocarcinoma of ovary ([place]) 07/13/2018  . Bradycardia 01/13/2018  . NSTEMI (non-ST elevated myocardial infarction) ([place]) 06/14/2017  . History of ischemic right PCA stroke 03/27/2016  . Hemianopia, homonymous, left 01/31/2016  . Arthritis of knee, degenerative 05/04/2013  . Diastolic dysfunction 40/03/2724  . CAD S/P percutaneous coronary angioplasty   . Hypertension   . Hyperlipidemia   . Morbid obesity ([place])   . Right shoulder pain   . Anxiety and depression   . Dyspnea on exertion   .  Cellulitis of right leg     Past Medical History: Past Medical History:  Diagnosis Date  . Acetabular  fracture ([place])   . Anxiety and depression   . Bartholin cyst   . CAD (coronary artery disease)   . Cancer ([place])    mouth cyst was cancer many years ago   . Cellulitis of right leg 2005  . CHF (congestive heart failure) ([place])   . Colon polyp   . Complication of anesthesia   . Diabetes mellitus without complication ([place])   . DJD (degenerative joint disease)   . Dyspnea on exertion   . Dysrhythmia    PAF  . GERD (gastroesophageal reflux disease)   . Hyperlipidemia   . Hypertension   . Morbid obesity ([place])   . Myocardial infarction ([place]) 06/14/2017  . PAF (paroxysmal atrial fibrillation) ([place])    a. diagnosed 7/19; b. CHADS2VASc 8; c. Eliquis  . PONV (postoperative nausea and vomiting)   . Presence of stent in right coronary artery 02/08/12   2.5x18 Xience distal RCA  . Right shoulder pain   . Stroke ([place]) 2017  . Vaginal prolapse     Past Surgical History: Past Surgical History:  Procedure Laterality Date  . ABDOMINAL HYSTERECTOMY     supracervical abdominal w/removal tubes &/or ovaries  . BREAST BIOPSY  1989  . CHOLECYSTECTOMY    . COLONOSCOPY    . COLONOSCOPY WITH PROPOFOL N/A 06/21/2018   Procedure: COLONOSCOPY WITH PROPOFOL;  Surgeon: Lollie Sails, MD;  Location: [facility] ENDOSCOPY;  Service: Endoscopy;  Laterality: N/A;  . CORONARY STENT INTERVENTION N/A 06/14/2017   Procedure: CORONARY STENT INTERVENTION;  Surgeon: Wellington Hampshire, MD;  Location: [place] CV LAB;  Service: Cardiovascular;  Laterality: N/A;  . ESOPHAGOGASTRODUODENOSCOPY N/A 06/21/2018   Procedure: ESOPHAGOGASTRODUODENOSCOPY (EGD);  Surgeon: Lollie Sails, MD;  Location: [facility] ENDOSCOPY;  Service: Endoscopy;  Laterality: N/A;  . FACIAL COSMETIC SURGERY     face/neck lift  . FRACTURE SURGERY    . GASTRIC BYPASS  2008  . JOINT REPLACEMENT    . KNEE SURGERY     left knee arthroscopic  . LAPAROSCOPIC BILATERAL SALPINGO OOPHERECTOMY Bilateral 06/24/2018   Procedure: LAPAROSCOPIC BILATERAL  SALPINGO OOPHORECTOMY;  Surgeon: Schermerhorn, Gwen Her, MD;  Location: [facility] ORS;  Service: Gynecology;  Laterality: Bilateral;  . LEFT HEART CATH AND CORONARY ANGIOGRAPHY N/A 06/14/2017   Procedure: LEFT HEART CATH AND CORONARY ANGIOGRAPHY;  Surgeon: Wellington Hampshire, MD;  Location: [place] CV LAB;  Service: Cardiovascular;  Laterality: N/A;  . LEFT HEART CATH AND CORONARY ANGIOGRAPHY N/A 06/14/2017   Procedure: LEFT HEART CATH AND CORONARY ANGIOGRAPHY;  Surgeon: Wellington Hampshire, MD;  Location: [place] CV LAB;  Service: Cardiovascular;  Laterality: N/A;  . LYSIS OF ADHESION  06/24/2018   Procedure: LYSIS OF ADHESION;  Surgeon: Ouida Sills Gwen Her, MD;  Location: [facility] ORS;  Service: Gynecology;;  . Crisoforo Oxford   left hip fracture/repair and left arm fracture  . OTHER SURGICAL HISTORY  1986   hysterectomy  . right knee  late 90's   arthroscopic  . ROUX-EN-Y PROCEDURE    . SALIVARY GLAND SURGERY    . TOTAL HIP ARTHROPLASTY  2004   left      OB History:  OB History  Gravida Para Term Preterm AB Living  1 1       1   SAB TAB Ectopic Multiple Live Births               #  Outcome Date GA Lbr Len/2nd Weight Sex Delivery Anes PTL Lv  1 Para             Family History: Family History  Problem Relation Age of Onset  . Heart attack Father        MI age 85 and 62  . Heart attack Sister 48       cabg  . Diabetes Sister   . Hypertension Sister   . Heart attack Brother 75       cabg  . Diabetes Brother   . Cancer Mother        colon, liver  . Diabetes Maternal Grandmother   . Cancer Maternal Grandfather        unknown  . Hypertension Daughter   . Thyroid disease Daughter   . Breast cancer Neg Hx     Social History: Social History   Socioeconomic History  . Marital status: Married    Spouse name: Not on file  . Number of children: 1  . Years of education: Not on file  . Highest education level: Not on file  Occupational History  . Occupation: nurse-retired   Social Needs  . Financial resource strain: Not on file  . Food insecurity:    Worry: Not on file    Inability: Not on file  . Transportation needs:    Medical: Not on file    Non-medical: Not on file  Tobacco Use  . Smoking status: Former Smoker    Packs/day: 2.00    Years: 26.00    Pack years: 52.00    Types: Cigarettes    Last attempt to quit: 06/22/1984    Years since quitting: 34.0  . Smokeless tobacco: Never Used  Substance and Sexual Activity  . Alcohol use: Yes    Alcohol/week: 0.0 standard drinks    Frequency: Never    Comment: rare- less than once a month  . Drug use: No  . Sexual activity: Yes    Birth control/protection: Surgical    Comment: hysterectomy  Lifestyle  . Physical activity:    Days per week: Not on file    Minutes per session: Not on file  . Stress: Not on file  Relationships  . Social connections:    Talks on phone: Not on file    Gets together: Not on file    Attends religious service: Not on file    Active member of club or organization: Not on file    Attends meetings of clubs or organizations: Not on file    Relationship status: Not on file  . Intimate partner violence:    Fear of current or ex partner: Not on file    Emotionally abused: Not on file    Physically abused: Not on file    Forced sexual activity: Not on file  Other Topics Concern  . Not on file  Social History Narrative  . Not on file    Allergies: Allergies  Allergen Reactions  . Erythromycin Base Nausea And Vomiting  . Tape Itching, Rash and Other (See Comments)    Also "band-aids"    Current Medications: Current Outpatient Medications  Medication Sig Dispense Refill  . amLODipine (NORVASC) 2.5 MG tablet Take 1 tablet (2.5 mg total) by mouth daily. 90 tablet 3  . apixaban (ELIQUIS) 5 MG TABS tablet Take 1 tablet (5 mg total) by mouth 2 (two) times daily. 60 tablet 0  . clopidogrel (PLAVIX) 75 MG tablet Take 1 tablet (75 mg total)  by mouth daily. 30 tablet 0  .  cyanocobalamin (,VITAMIN B-12,) 1000 MCG/ML injection Inject 1,000 mcg into the muscle every 30 (thirty) days.    . ergocalciferol (VITAMIN D2) 1.25 MG (50000 UT) capsule Take 50,000 Units by mouth every Monday.    . ezetimibe (ZETIA) 10 MG tablet TAKE 1 TABLET(10 MG) BY MOUTH DAILY (Patient taking differently: Take 10 mg by mouth daily. ) 30 tablet 5  . HYDROcodone-acetaminophen (NORCO/VICODIN) 5-325 MG tablet Take 1 tablet by mouth at bedtime as needed for moderate pain.   0  . Multiple Vitamin (MULTIVITAMIN WITH MINERALS) TABS tablet Take 1 tablet by mouth daily. 30 tablet 1  . Multiple Vitamins-Minerals (OCUVITE PO) Take 1 capsule by mouth daily.    . nitroGLYCERIN (NITROSTAT) 0.4 MG SL tablet Place 1 tablet (0.4 mg total) under the tongue every 5 (five) minutes as needed for chest pain. 30 tablet 2  . rosuvastatin (CRESTOR) 40 MG tablet Take 1 tablet (40 mg total) by mouth daily. 30 tablet 3  . senna-docusate (SENNA S) 8.6-50 MG tablet Take 1 capsule by mouth daily.     . sertraline (ZOLOFT) 100 MG tablet Take 100 mg by mouth daily.     . ondansetron (ZOFRAN) 4 MG tablet Take 4 mg by mouth every 8 (eight) hours as needed for nausea or vomiting.    . protein supplement shake (PREMIER PROTEIN) LIQD Take 325 mLs (11 oz total) by mouth 2 (two) times daily between meals. (Patient not taking: Reported on 07/13/2018) 60 Can 0   No current facility-administered medications for this visit.     Review of Systems General: negative for fevers, chills, fatigue, changes in sleep, changes in weight or appetite Skin: negative for changes in color, texture, moles or lesions Eyes: negative for changes in vision, pain, diplopia HEENT: negative for change in hearing, pain, discharge, tinnitus, vertigo, voice changes, sore throat, neck masses Breasts: negative for breast lumps Pulmonary: negative for dyspnea, orthopnea, productive cough Cardiac: negative for palpitations, syncope, pain, discomfort,  pressure Gastrointestinal: negative for dysphagia, nausea, vomiting, jaundice, pain, constipation, diarrhea, hematemesis Genitourinary/Sexual: negative for dysuria, discharge, hesitancy, nocturia, retention, stones, infections, STD's, incontinence Ob/Gyn: negative for irregular bleeding, pain Musculoskeletal: negative for pain, stiffness, swelling, range of motion limitation Hematology: negative for easy bruising, bleeding Neurologic/Psych: negative for headaches, seizures, paralysis, weakness, tremor, change in gait, change in sensation, mood swings, depression, anxiety, change in memory   Objective:  Physical Examination:  BP 131/84   Pulse 80   Temp 98.6 F (37 C) (Tympanic)   Resp 18   Ht 5\' 8"  (1.727 m)   Wt 179 lb 3.2 oz (81.3 kg)   BMI 27.25 kg/m     ECOG Performance Status: 1 - Symptomatic but completely ambulatory  GENERAL: Patient is a well appearing female in no acute distress HEENT:  Sclerae anicteric.  Oropharynx clear and moist. No ulcerations or evidence of oropharyngeal candidiasis. Neck is supple.  NODES:  No cervical, supraclavicular, or axillary lymphadenopathy palpated.  LUNGS:  Clear to auscultation bilaterally.  No wheezes or rhonchi. HEART:  Regular rate and rhythm. No murmur appreciated. ABDOMEN:  Soft, nontender.  Positive, normoactive bowel sounds. No organomegaly palpated. MSK:  No focal spinal tenderness to palpation. Full range of motion bilaterally in the upper extremities. EXTREMITIES:  No peripheral edema.   SKIN:  Clear with no obvious rashes or skin changes. No nail dyscrasia. NEURO:  Nonfocal. Well oriented.  Appropriate affect.  Pelvic: Exam Chaperoned by NP EGBUS: no  lesions Bimanual: no palpable masses Rectovaginal: confirmatory     Assessment:  Margaret [name] is a 78 y.o. female diagnosed with Stage II microscopic high grade serous ovarian cancer involving both adnexa after laparoscopic BSO for 5 cm complex right ovarian mass with normal  CA125.  Mass removed intact and no obvious evidence of metastatic disease at surgery or on CT scan.    She is a smoker and has a right lower lobe nodule that has enlarged from 9 mm to 12 mm.  Not thought to be amenable to biopsy because near diaphragm.    Medical co-morbidities complicating care: cardiovascular disease (CVA, CAD with stent on Eliquis and Plavix, NSTEMI).  Plan:   Problem List Items Addressed This Visit      Endocrine   Primary high grade serous adenocarcinoma of ovary ([place]) - Primary   Relevant Orders   Ambulatory referral to Hematology / Oncology   Ambulatory referral to Genetics    Other Visit Diagnoses    Solitary pulmonary nodule       Relevant Orders   NM PET Image Initial (PI) Skull Base To Thigh     We discussed options for management including adjuvant therapy with 6 cycles of carboplatin/taxol in view of high risk of recurrence.  Would not recommend avastin in view of cardiovascular issues and lack of metastatic disease.  Will order PET scan to better characterize the lung lesion and this could also better assess for peritoneal disease.  The patient's diagnosis, an outline of the further diagnostic and laboratory studies which will be required, the recommendation for surgery, and alternatives were discussed with her and her accompanying family members.  All questions were answered to their satisfaction.  A total of 60 minutes were spent with the patient/family today; 50% was spent in education, counseling and coordination of care for ovarian cancer.    Mellody Drown, MD  CC:  Schermerhorn, Gwen Her, MD [address] [facility] [address] [place], [place] [address] [phone]

## 2018-07-14 ENCOUNTER — Encounter: Payer: Self-pay | Admitting: Genetic Counselor

## 2018-07-14 ENCOUNTER — Telehealth: Payer: Self-pay | Admitting: Genetic Counselor

## 2018-07-14 ENCOUNTER — Telehealth: Payer: Self-pay

## 2018-07-14 DIAGNOSIS — C569 Malignant neoplasm of unspecified ovary: Secondary | ICD-10-CM

## 2018-07-14 NOTE — Addendum Note (Signed)
Addended by: Steele Berg on: 07/14/2018 02:51 PM   Modules accepted: Orders

## 2018-07-14 NOTE — Telephone Encounter (Signed)
Spoke with Dr. Reuel Derby regarding tissue for HRD. She does NOT have enough tissue for HRD testing.

## 2018-07-14 NOTE — Telephone Encounter (Addendum)
Cancer Genetics            Telegenetics Initial Visit    Patient Name: Margaret [name] Patient DOB: [birth year] Patient Age: 78 y.o. Phone Call Date: 07/14/2018  Referring Provider: [name], NP  Reason for Visit: Evaluate for hereditary susceptibility to cancer    Assessment and Plan:  . Margaret [name]' personal history of ovarian cancer warrants a genetics evaluation even though her family history is not highly suggestive of a hereditary predisposition to cancer. Other than determining her BRCA status, ruling out Donnal Debar is important since her mother had colorectal cancer.  . Testing is recommended to determine whether she has a pathogenic mutation that will impact her screening and risk-reduction for cancer. A negative result will be reassuring.  . Margaret [name] wished to pursue genetic testing. We discussed options for both germline and germline/somatic testing (HRD) and I explained that I will get clarification from her providers as to which test they are recommending. If her blood has not already been drawn for germline testing, it can be done when she is in clinic to see Dr. Janese Banks on 07/21/2018. ADDENDUM: Per [name]: There is not enough tissue for HRD testing. Will proceed with [facility] Multi-Cancer panel. Order entered to have blood drawn on 07/21/2018.   Margaret [name]' questions were answered to her satisfaction today and she is welcome to call with any additional questions or concerns. Thank you for the referral and allowing me to share in the care of your patient.   Dr. Grayland Ormond was available for questions concerning this case. Total time spent by counseling by phone was approximately 25 minutes.   _____________________________________________________________________   History of Present Illness: Margaret [name], a 78 y.o. female, was referred for genetic counseling to discuss the possibility of a hereditary predisposition to cancer and discuss whether genetic testing  is warranted. This was a telegenetics visit via phone.  Margaret [name] was recently diagnosed with ovarian cancer at the age of 50.  She reports a history of polyps, but did not know how many and what types were removed prior to her most recent colonoscopy last month. At her colonoscopy in 05/2018, 2 polyps were removed. Pathology confirmed one was a SSA and one was a TA.  Past Medical History:  Diagnosis Date  . Acetabular fracture ([place])   . Anxiety and depression   . Bartholin cyst   . CAD (coronary artery disease)   . Cancer ([place])    mouth cyst was cancer many years ago   . Cellulitis of right leg 2005  . CHF (congestive heart failure) ([place])   . Colon polyp   . Complication of anesthesia   . Diabetes mellitus without complication ([place])   . DJD (degenerative joint disease)   . Dyspnea on exertion   . Dysrhythmia    PAF  . GERD (gastroesophageal reflux disease)   . Hyperlipidemia   . Hypertension   . Morbid obesity ([place])   . Myocardial infarction ([place]) 06/14/2017  . PAF (paroxysmal atrial fibrillation) ([place])    a. diagnosed 7/19; b. CHADS2VASc 8; c. Eliquis  . PONV (postoperative nausea and vomiting)   . Presence of stent in right coronary artery 02/08/12   2.5x18 Xience distal RCA  . Right shoulder pain   . Stroke ([place]) 2017  . Vaginal prolapse     Past Surgical History:  Procedure Laterality Date  . ABDOMINAL HYSTERECTOMY     supracervical  abdominal w/removal tubes &/or ovaries  . BREAST BIOPSY  1989  . CHOLECYSTECTOMY    . COLONOSCOPY    . COLONOSCOPY WITH PROPOFOL N/A 06/21/2018   Procedure: COLONOSCOPY WITH PROPOFOL;  Surgeon: Lollie Sails, MD;  Location: [facility] ENDOSCOPY;  Service: Endoscopy;  Laterality: N/A;  . CORONARY STENT INTERVENTION N/A 06/14/2017   Procedure: CORONARY STENT INTERVENTION;  Surgeon: Wellington Hampshire, MD;  Location: [place] CV LAB;  Service: Cardiovascular;  Laterality: N/A;  . ESOPHAGOGASTRODUODENOSCOPY N/A 06/21/2018   Procedure:  ESOPHAGOGASTRODUODENOSCOPY (EGD);  Surgeon: Lollie Sails, MD;  Location: [facility] ENDOSCOPY;  Service: Endoscopy;  Laterality: N/A;  . FACIAL COSMETIC SURGERY     face/neck lift  . FRACTURE SURGERY    . GASTRIC BYPASS  2008  . JOINT REPLACEMENT    . KNEE SURGERY     left knee arthroscopic  . LAPAROSCOPIC BILATERAL SALPINGO OOPHERECTOMY Bilateral 06/24/2018   Procedure: LAPAROSCOPIC BILATERAL SALPINGO OOPHORECTOMY;  Surgeon: Schermerhorn, Gwen Her, MD;  Location: [facility] ORS;  Service: Gynecology;  Laterality: Bilateral;  . LEFT HEART CATH AND CORONARY ANGIOGRAPHY N/A 06/14/2017   Procedure: LEFT HEART CATH AND CORONARY ANGIOGRAPHY;  Surgeon: Wellington Hampshire, MD;  Location: [place] CV LAB;  Service: Cardiovascular;  Laterality: N/A;  . LEFT HEART CATH AND CORONARY ANGIOGRAPHY N/A 06/14/2017   Procedure: LEFT HEART CATH AND CORONARY ANGIOGRAPHY;  Surgeon: Wellington Hampshire, MD;  Location: [place] CV LAB;  Service: Cardiovascular;  Laterality: N/A;  . LYSIS OF ADHESION  06/24/2018   Procedure: LYSIS OF ADHESION;  Surgeon: Ouida Sills Gwen Her, MD;  Location: [facility] ORS;  Service: Gynecology;;  . Crisoforo [place]   left hip fracture/repair and left arm fracture  . OTHER SURGICAL HISTORY  1986   hysterectomy  . right knee  late 90's   arthroscopic  . ROUX-EN-Y PROCEDURE    . SALIVARY GLAND SURGERY    . TOTAL HIP ARTHROPLASTY  2004   left    Family History: Significant diagnoses include the following:  Family History  Problem Relation Age of Onset  . Heart attack Father        MI age 82 and 24  . Heart attack Sister 45       cabg; deceased 33  . Diabetes Sister   . Hypertension Sister   . Heart attack Brother 46       cabg; deceased 72s  . Diabetes Brother   . Colon cancer Mother 55       deceased 93  . Diabetes Maternal Grandmother   . Cancer Maternal Grandfather        unknown type; deceased  . Hypertension Daughter   . Thyroid disease Daughter   . Breast cancer Neg Hx       Additionally, Margaret [name] has one daughter (age 78). She had a brother and sister; both deceased in 68s or 14s.  Margaret [name] ancestry is Caucasian - NOS. There is no known Jewish ancestry and no consanguinity.  Discussion: We reviewed the characteristics, features and inheritance patterns of hereditary cancer syndromes. We discussed her risk of harboring a mutation in the context of her personal and family history. We discussed the process of genetic testing, insurance coverage and implications of results: positive, negative and variant of uncertain significance (VUS).      Steele Berg, MS, [place] Certified Genetic Counselor phone: [phone]

## 2018-07-14 NOTE — Telephone Encounter (Signed)
Per Mariea Clonts: There is not enough tissue for HRD testing. Will proceed with [facility] Multi-Cancer panel. Order entered to have blood drawn on 07/21/2018.

## 2018-07-14 NOTE — Telephone Encounter (Signed)
I received an in-basket message from [name] referring Margaret [name] for genetic counseling due to a new diagnosis of ovarian cancer. I spoke with her to schedule this telegenetics visit to be done by phone at her convenience and she scheduled it for today 07/14/18 at 12:30pm   Steele Berg, [place], [facility] Genetic Counselor Phone: [phone]

## 2018-07-15 DIAGNOSIS — M1712 Unilateral primary osteoarthritis, left knee: Secondary | ICD-10-CM | POA: Diagnosis not present

## 2018-07-17 NOTE — Progress Notes (Signed)
Margaret [name] Date of Birth: May 23, 1941 Medical Record #[id number]  History of Present Illness: Margaret [name] is seen for follow up CAD. She had had previous stress test in 2006 and in 2011.  In June 2013 she had a nuclear stress test which revealed a small reversible defect in the anterior and apical wall segments. This led to a cardiac catheterization which demonstrated moderate tandem lesions in the right coronary with a 50-70% stenosis in the mid vessel and an 80% stenosis distally. The distal lesion was treated with a 2.5 x 18 mm Xience stent. The mid vessel lesion was not treated apparently based on the results of a flow wire analysis. This  made no change in her dyspnea. She did have a followup nuclear stress test on 03/23/2012 which was normal.   She also has history of GERD, HTN, HLD, morbid obesity s/p gastric bypass, R-PCA CVA 10/2015 at [facility] w/ Plavix added. She was seen in October 2017 with nonexertional chest pain. A myoview study was done showing no perfusion abnormality and EF 54%. She is followed by Neurology at [facility]. She had a Holter monitor in January 2018 that showed frequent PACs and short runs of PAT but no Afib. She is completely asymptomatic.   She presented 06/14/17 with chest pain and was found to have significantly elevated troponin with EKG changes suggestive of inferior infarct that did not meet criteria for a STEMI. She was taken to the lab by Dr Fletcher Anon. Cath revealed thrombotic in stent restenosis in the RCA. She was treated with extensive balloon angioplasty, aspiration thrombectomy, and overlapping DES placement. Relook cath for chest pain and SOB later that day showed a widely patent RCA site. She was felt to be volume overloaded and HTN and he medications were adjusted. Echo prior to discharge showed an EF of 55-60% with mild LVH, and grade 2 DD. She was placed on Brilinta.   Admitted to [facility] 01/12/18 with weakness and noted to be bradycardic into the 30s to 40s bpm.  Medication review revealed the patient had been taking both Coreg and Lopressor. It remained unclear why Lopressor was added to her regimen by outside office. Her beta blockers were held with improvement in her heart rate. Troponin was flat trending at 0.03. TSH normal, K+ 2.6-->3.7, magnesium 2.8, WBC 11.2-->8.0, HGB 10.7-->9.8. On 7/25, the patient developed new onset Afib with RVR. Given her CHADS2VASc of at least 8 (HTN, age x 2, DM, stroke x 2, vascular disease, female) DOAC was recommended. She was placed on Eliquis 5 mg bid and her ASA was stopped. She was also transitioned from [place] to Plavix (following loading dose). There were initially plans to place the patient on amiodarone for pharmacological conversion; however the patient spontaneously converted back to sinus rhythm with a mildly bradycardic heart rate without intervention and amiodarone was not needed/used. She was not discharged on any beta blockers given mildly bradycardic heart rates in the 50s bpm.   She was seen in the office on 01/25/18 by Christell Faith PA-C. BP was low so amlodipine was stopped. Echo was done and looked good. Report below. Myoview was updated and showed no ischemia. Inferior scar. EF 48%.  Since then she has been diagnosed with iron deficiency anemia and heme positive stool. Seen by GI. CT of chest and abdomen done. Found to have 4 cm right adnexal mass. Follow up US showed a complex ovarian cyst.  She also has an anterior mediastinal mass c/w a small thymoma versus pericardial cyst.  Subsequent upper EGD showed mild esophagitis. No bleeding. Colonoscopy showed 2 polyps that were removed and diverticuli. She underwent BSO in January for complex ovarian cyst. Pathology showed MICROSCOPIC FOCUS OF HIGH-GRADE SEROUS CARCINOMA, - SEROUS CYST ADENOFIBROMA, MUCINOUS CYSTADENOMA WITH FOCAL PROLIFERATIVE FEATURES. She is scheduled for a PET scan to evaluate pulmonary nodule. Referred to oncology for consideration of chemotherapy.     On follow up today she is seen with her daughter. Planning to start chemo therapy with Cisplatin and Taxol - 6 treatments over 18 weeks. Having Porta Cath placed. She is doing well from a cardiac standpoint. No chest pain, dyspnea, palpitations, dizziness, edema. She reports her PET scan showed no metastatic disease.   Current Outpatient Medications on File Prior to Visit  Medication Sig Dispense Refill  . amLODipine (NORVASC) 2.5 MG tablet Take 1 tablet (2.5 mg total) by mouth daily. 90 tablet 3  . apixaban (ELIQUIS) 5 MG TABS tablet Take 1 tablet (5 mg total) by mouth 2 (two) times daily. 60 tablet 0  . clopidogrel (PLAVIX) 75 MG tablet Take 1 tablet (75 mg total) by mouth daily. 30 tablet 0  . cyanocobalamin (,VITAMIN B-12,) 1000 MCG/ML injection Inject 1,000 mcg into the muscle every 30 (thirty) days.    . ergocalciferol (VITAMIN D2) 1.25 MG (50000 UT) capsule Take 50,000 Units by mouth every Monday.    . ezetimibe (ZETIA) 10 MG tablet TAKE 1 TABLET(10 MG) BY MOUTH DAILY (Patient taking differently: Take 10 mg by mouth daily. ) 30 tablet 5  . HYDROcodone-acetaminophen (NORCO/VICODIN) 5-325 MG tablet Take 1 tablet by mouth at bedtime as needed for moderate pain.   0  . Multiple Vitamin (MULTIVITAMIN WITH MINERALS) TABS tablet Take 1 tablet by mouth daily. 30 tablet 1  . Multiple Vitamins-Minerals (OCUVITE PO) Take 1 capsule by mouth daily.    . nitroGLYCERIN (NITROSTAT) 0.4 MG SL tablet Place 1 tablet (0.4 mg total) under the tongue every 5 (five) minutes as needed for chest pain. 30 tablet 2  . ondansetron (ZOFRAN) 4 MG tablet Take 4 mg by mouth every 8 (eight) hours as needed for nausea or vomiting.    . protein supplement shake (PREMIER PROTEIN) LIQD Take 325 mLs (11 oz total) by mouth 2 (two) times daily between meals. 60 Can 0  . rosuvastatin (CRESTOR) 40 MG tablet Take 1 tablet (40 mg total) by mouth daily. 30 tablet 3  . senna-docusate (SENNA S) 8.6-50 MG tablet Take 1 capsule by  mouth daily.     . sertraline (ZOLOFT) 100 MG tablet Take 100 mg by mouth daily.      No current facility-administered medications on file prior to visit.     Allergies  Allergen Reactions  . Erythromycin Base Nausea And Vomiting  . Tape Itching, Rash and Other (See Comments)    Also "band-aids"    Past Medical History:  Diagnosis Date  . Acetabular fracture ([place])   . Anxiety and depression   . Bartholin cyst   . CAD (coronary artery disease)   . Cancer ([place])    mouth cyst was cancer many years ago   . Cellulitis of right leg 2005  . CHF (congestive heart failure) ([place])   . Colon polyp   . Complication of anesthesia   . Diabetes mellitus without complication ([place])   . DJD (degenerative joint disease)   . Dyspnea on exertion   . Dysrhythmia    PAF  . GERD (gastroesophageal reflux disease)   . Hyperlipidemia   .  Hypertension   . Morbid obesity ([place])   . Myocardial infarction ([place]) 06/14/2017  . PAF (paroxysmal atrial fibrillation) ([place])    a. diagnosed 7/19; b. CHADS2VASc 8; c. Eliquis  . PONV (postoperative nausea and vomiting)   . Presence of stent in right coronary artery 02/08/12   2.5x18 Xience distal RCA  . Right shoulder pain   . Stroke ([place]) 2017  . Vaginal prolapse     Past Surgical History:  Procedure Laterality Date  . ABDOMINAL HYSTERECTOMY     supracervical abdominal w/removal tubes &/or ovaries  . BREAST BIOPSY  1989  . CHOLECYSTECTOMY    . COLONOSCOPY    . COLONOSCOPY WITH PROPOFOL N/A 06/21/2018   Procedure: COLONOSCOPY WITH PROPOFOL;  Surgeon: Lollie Sails, MD;  Location: [facility] ENDOSCOPY;  Service: Endoscopy;  Laterality: N/A;  . CORONARY STENT INTERVENTION N/A 06/14/2017   Procedure: CORONARY STENT INTERVENTION;  Surgeon: Wellington Hampshire, MD;  Location: [place] CV LAB;  Service: Cardiovascular;  Laterality: N/A;  . ESOPHAGOGASTRODUODENOSCOPY N/A 06/21/2018   Procedure: ESOPHAGOGASTRODUODENOSCOPY (EGD);  Surgeon: Lollie Sails,  MD;  Location: [facility] ENDOSCOPY;  Service: Endoscopy;  Laterality: N/A;  . FACIAL COSMETIC SURGERY     face/neck lift  . FRACTURE SURGERY    . GASTRIC BYPASS  2008  . JOINT REPLACEMENT    . KNEE SURGERY     left knee arthroscopic  . LAPAROSCOPIC BILATERAL SALPINGO OOPHERECTOMY Bilateral 06/24/2018   Procedure: LAPAROSCOPIC BILATERAL SALPINGO OOPHORECTOMY;  Surgeon: Schermerhorn, Gwen Her, MD;  Location: [facility] ORS;  Service: Gynecology;  Laterality: Bilateral;  . LEFT HEART CATH AND CORONARY ANGIOGRAPHY N/A 06/14/2017   Procedure: LEFT HEART CATH AND CORONARY ANGIOGRAPHY;  Surgeon: Wellington Hampshire, MD;  Location: [place] CV LAB;  Service: Cardiovascular;  Laterality: N/A;  . LEFT HEART CATH AND CORONARY ANGIOGRAPHY N/A 06/14/2017   Procedure: LEFT HEART CATH AND CORONARY ANGIOGRAPHY;  Surgeon: Wellington Hampshire, MD;  Location: [place] CV LAB;  Service: Cardiovascular;  Laterality: N/A;  . LYSIS OF ADHESION  06/24/2018   Procedure: LYSIS OF ADHESION;  Surgeon: Ouida Sills Gwen Her, MD;  Location: [facility] ORS;  Service: Gynecology;;  . Crisoforo [place]   left hip fracture/repair and left arm fracture  . OTHER SURGICAL HISTORY  1986   hysterectomy  . right knee  late 90's   arthroscopic  . ROUX-EN-Y PROCEDURE    . SALIVARY GLAND SURGERY    . TOTAL HIP ARTHROPLASTY  2004   left    Social History   Tobacco Use  Smoking Status Former Smoker  . Packs/day: 2.00  . Years: 26.00  . Pack years: 52.00  . Types: Cigarettes  . Last attempt to quit: 06/22/1984  . Years since quitting: 34.1  Smokeless Tobacco Never Used    Social History   Substance and Sexual Activity  Alcohol Use Yes  . Alcohol/week: 0.0 standard drinks  . Frequency: Never   Comment: rare- less than once a month    Family History  Problem Relation Age of Onset  . Heart attack Father        MI age 19 and 66  . Heart attack Sister 68       cabg; deceased 83  . Diabetes Sister   . Hypertension Sister   . Heart  attack Brother 48       cabg; deceased 8s  . Diabetes Brother   . Colon cancer Mother 30       deceased 54  .  Diabetes Maternal Grandmother   . Cancer Maternal Grandfather        unknown type; deceased  . Hypertension Daughter   . Thyroid disease Daughter   . Breast cancer Neg Hx     Review of Systems: The review of systems is positive for none.  She is a retired Mining engineer. All other systems were reviewed and are negative.  Physical Exam: BP (!) 170/82   Pulse 76   Ht 5\' 8"  (1.727 m)   Wt 181 lb 3.2 oz (82.2 kg)   BMI 27.55 kg/m  GENERAL:  Well appearing WF in NAD HEENT:  PERRL, EOMI, sclera are clear. Oropharynx is clear. NECK:  No jugular venous distention, carotid upstroke brisk and symmetric, no bruits, no thyromegaly or adenopathy LUNGS:  Clear to auscultation bilaterally CHEST:  Unremarkable HEART:  RRR,  PMI not displaced or sustained,S1 and S2 within normal limits, no S3, no S4: no clicks, no rubs, no murmurs ABD:  Soft, nontender. BS +, no masses or bruits. No hepatomegaly, no splenomegaly EXT:  2 + pulses throughout, no edema, no cyanosis no clubbing SKIN:  Warm and dry.  No rashes NEURO:  Alert and oriented x 3. Cranial nerves II through XII intact. PSYCH:  Cognitively intact  LABORATORY DATA:   Lab Results  Component Value Date   WBC 8.5 06/20/2018   HGB 12.9 06/24/2018   HCT 38.0 06/24/2018   PLT 239 06/20/2018   GLUCOSE 118 (H) 06/24/2018   CHOL 223 (H) 10/18/2017   TRIG 129 10/18/2017   HDL 68 10/18/2017   LDLCALC 129 (H) 10/18/2017   ALT 17 01/25/2018   AST 25 01/25/2018   NA 144 06/24/2018   K 4.0 06/24/2018   CL 109 06/20/2018   CREATININE 0.94 06/20/2018   BUN 15 06/20/2018   CO2 25 06/20/2018   TSH 3.272 01/12/2018   INR 1.07 01/12/2018   HGBA1C 7.4 (H) 01/12/2018   Labs reviewed from 11/06/15 glucose 122. Other chemistries and CBC normal.  Dated 01/19/18: glucose 125, potassium 3.3. Creatinine 1.1. Cholesterol 106,  triglycerides 120, HDL 42, LDL 40.  Dated 03/11/18: Hgb 9.7.  Dated 04/04/18: ferritin 8.    Cardiac cath/PCI 06/14/17: Procedures   CORONARY STENT INTERVENTION  LEFT HEART CATH AND CORONARY ANGIOGRAPHY  Conclusion     Mid RCA lesion is 80% stenosed.  Dist RCA lesion is 100% stenosed.  There is mild left ventricular systolic dysfunction.  LV end diastolic pressure is mildly elevated.  Ost Cx to Prox Cx lesion is 40% stenosed.  Prox Cx to Mid Cx lesion is 60% stenosed.  Ost 2nd Mrg to 2nd Mrg lesion is 30% stenosed.  Prox LAD lesion is 70% stenosed.  Ost 1st Diag to 1st Diag lesion is 90% stenosed.  A drug-eluting stent was successfully placed using a STENT SIERRA 2.50 X 38 MM.  Post intervention, there is a 0% residual stenosis.  Post intervention, there is a 0% residual stenosis.  A stent was successfully placed.   1.  Severe one-vessel coronary artery disease with very late stent thrombosis affecting the distal right coronary artery.  There is also borderline significant proximal to mid LAD stenosis at the origin of a diagonal branch and moderate left circumflex disease.  The coronary arteries are overall moderately calcified. 2.  Mildly reduced LV systolic function with an EF of 40% and inferior wall hypokinesis.  Mildly elevated left ventricular end-diastolic pressure. 3.  Successful complex angioplasty and 2 overlapped drug-eluting stent placement to  the right coronary artery with extensive balloon angioplasty and aspiration thrombectomy.  Recommendations: I am going to switch from Plavix to Brilinta given very late stent thrombosis.  Continue this for at least one year and preferably longer.  Aggressive treatment of risk factors. LAD disease can likely be treated medically.  However, if she has residual anginal symptoms, stress testing or pressure wire evaluation can be done to the LAD. This was a difficult and prolonged procedure due to large thrombus,  calcifications and tortuosity. The patient will be given precautions for exceeding radiation limits.     Echo 06/15/17: Study Conclusions  - Left ventricle: The cavity size was normal. There was mild   concentric hypertrophy. Systolic function was normal. The   estimated ejection fraction was in the range of 55% to 60%. Mild   hypokinesis of the inferior myocardium. Features are consistent   with a pseudonormal left ventricular filling pattern, with   concomitant abnormal relaxation and increased filling pressure   (grade 2 diastolic dysfunction). - Aortic valve: There was trivial regurgitation. - Mitral valve: There was mild regurgitation. - Left atrium: The atrium was moderately dilated. - Right atrium: The atrium was mildly dilated. - Pericardium, extracardiac: A trivial pericardial effusion was   identified posterior to the heart.  Echo 01/28/18:Study Conclusions  - Left ventricle: The cavity size was normal. There was mild   concentric hypertrophy. Systolic function was normal. The   estimated ejection fraction was in the range of 50% to 55%. Wall   motion was normal; there were no regional wall motion   abnormalities. Doppler parameters are consistent with abnormal   left ventricular relaxation (grade 1 diastolic dysfunction). - Aortic valve: There was trivial regurgitation. - Mitral valve: There was mild regurgitation. - Left atrium: The atrium was mildly dilated. - Pericardium, extracardiac: A trivial pericardial effusion was   identified posterior to the heart.  Impressions:  - NSR noted.   Myoview 02/01/18: Study Result    Abnormal pharmacologic myocardial perfusion stress test.  There is a moderate in size, moderate in severity, fixed inferior defect consistent with scar.  There is no significant ischemia.  The left ventricular ejection fraction is mildly decreased (46%) with inferior hypokinesis  This is an intermediate risk study.      Assessment  / Plan: 1. Coronary disease status post stenting of the distal RCA with a drug-eluting stent in 2013.  Myoview in October 2017 was normal.  Presented with Acute inferior STEMI in Dec. 2018 with occluded RCA stent. S/p aspiration thombectomy, PTCA and repeat stenting.  She is asymptomatic.  Off of beta blocker due to bradycardia. Now on Plavix and Eliquis. Plavix and Eliquis will be held for 5 days and 2 days respectively for [facility] cath placement then resume.  2. Hypertension, blood pressure is quite high today. Started on amlodipine 2.5 mg last visit. Will add losartan 50 mg daily.   3. Morbid obesity. Status post gastric bypass.  Weight stable.   4. Hyperlipidemia- on high dose statin and Zetia. Excellent response with LDL 40. Continue therapy.   5. Iron deficiency anemia with heme positive stool but no frank bleeding. Currently on iron replacement. GI evaluation showed 2 polyps.   6. S/p right posterior CVA with visual deficit. On Eliquis now.  7. Afib paroxysmal. Very high stroke risk with [place] vasc score of 8. Now on Eliquis. She will need anticoagulation long term.    8. Adnexal mass. Cystadenocarcinoma. S/p BSO. Planning to start chemotherapy.  9.  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 Per primary care.    I will follow up in 4 months.

## 2018-07-19 ENCOUNTER — Ambulatory Visit
Admission: RE | Admit: 2018-07-19 | Discharge: 2018-07-19 | Disposition: A | Discharge status: Home / Self Care | Payer: PPO | Source: Ambulatory Visit | Attending: Nurse Practitioner | Admitting: Nurse Practitioner

## 2018-07-19 DIAGNOSIS — R911 Solitary pulmonary nodule: Secondary | ICD-10-CM | POA: Insufficient documentation

## 2018-07-19 DIAGNOSIS — C569 Malignant neoplasm of unspecified ovary: Secondary | ICD-10-CM | POA: Diagnosis not present

## 2018-07-19 LAB — GLUCOSE, CAPILLARY: Glucose-Capillary: 109 mg/dL — ABNORMAL HIGH (ref 70–99)

## 2018-07-19 MED ORDER — FLUDEOXYGLUCOSE F - 18 (FDG) INJECTION
9.7500 | Freq: Once | INTRAVENOUS | Status: AC | PRN
  Administered 2018-07-19: 9.75 via INTRAVENOUS

## 2018-07-21 ENCOUNTER — Ambulatory Visit (INDEPENDENT_AMBULATORY_CARE_PROVIDER_SITE_OTHER): Payer: PPO | Admitting: Cardiology

## 2018-07-21 ENCOUNTER — Inpatient Hospital Stay: Payer: PPO

## 2018-07-21 ENCOUNTER — Inpatient Hospital Stay (HOSPITAL_BASED_OUTPATIENT_CLINIC_OR_DEPARTMENT_OTHER): Payer: PPO | Admitting: Oncology

## 2018-07-21 ENCOUNTER — Encounter: Payer: Self-pay | Admitting: Oncology

## 2018-07-21 ENCOUNTER — Encounter: Payer: Self-pay | Admitting: Cardiology

## 2018-07-21 ENCOUNTER — Other Ambulatory Visit: Payer: Self-pay

## 2018-07-21 VITALS — BP 152/85 | HR 72 | Temp 98.7°F | Resp 12 | Ht 68.0 in | Wt 179.5 lb

## 2018-07-21 VITALS — BP 170/82 | HR 76 | Ht 68.0 in | Wt 181.2 lb

## 2018-07-21 DIAGNOSIS — Z9884 Bariatric surgery status: Secondary | ICD-10-CM

## 2018-07-21 DIAGNOSIS — R911 Solitary pulmonary nodule: Secondary | ICD-10-CM

## 2018-07-21 DIAGNOSIS — I25118 Atherosclerotic heart disease of native coronary artery with other forms of angina pectoris: Secondary | ICD-10-CM

## 2018-07-21 DIAGNOSIS — Z800 Family history of malignant neoplasm of digestive organs: Secondary | ICD-10-CM | POA: Diagnosis not present

## 2018-07-21 DIAGNOSIS — C569 Malignant neoplasm of unspecified ovary: Secondary | ICD-10-CM

## 2018-07-21 DIAGNOSIS — Z7189 Other specified counseling: Secondary | ICD-10-CM

## 2018-07-21 DIAGNOSIS — Z7901 Long term (current) use of anticoagulants: Secondary | ICD-10-CM

## 2018-07-21 DIAGNOSIS — I10 Essential (primary) hypertension: Secondary | ICD-10-CM

## 2018-07-21 DIAGNOSIS — E785 Hyperlipidemia, unspecified: Secondary | ICD-10-CM | POA: Diagnosis not present

## 2018-07-21 DIAGNOSIS — R5382 Chronic fatigue, unspecified: Secondary | ICD-10-CM | POA: Diagnosis not present

## 2018-07-21 DIAGNOSIS — Z90710 Acquired absence of both cervix and uterus: Secondary | ICD-10-CM | POA: Diagnosis not present

## 2018-07-21 DIAGNOSIS — I480 Paroxysmal atrial fibrillation: Secondary | ICD-10-CM | POA: Diagnosis not present

## 2018-07-21 MED ORDER — LOSARTAN POTASSIUM 50 MG PO TABS: 50.0000 mg | ORAL_TABLET | Freq: Every day | ORAL | 3 refills | Status: DC

## 2018-07-21 NOTE — Patient Instructions (Signed)
Add losartan 50 mg daily for blood pressure.  When you have your Porta cath placed you should hold Plavix for 5 days prior and Eliquis 2 days prior. Resume afterwards

## 2018-07-21 NOTE — Progress Notes (Signed)
Patient here for consultation and treatment plan.

## 2018-07-22 ENCOUNTER — Other Ambulatory Visit (INDEPENDENT_AMBULATORY_CARE_PROVIDER_SITE_OTHER): Payer: Self-pay | Admitting: Nurse Practitioner

## 2018-07-22 ENCOUNTER — Encounter: Payer: Self-pay | Admitting: Oncology

## 2018-07-22 ENCOUNTER — Telehealth: Payer: Self-pay | Admitting: *Deleted

## 2018-07-22 DIAGNOSIS — Z7189 Other specified counseling: Secondary | ICD-10-CM | POA: Insufficient documentation

## 2018-07-22 DIAGNOSIS — M1712 Unilateral primary osteoarthritis, left knee: Secondary | ICD-10-CM | POA: Diagnosis not present

## 2018-07-22 MED ORDER — LORAZEPAM 0.5 MG PO TABS: 0.5000 mg | ORAL_TABLET | Freq: Four times a day (QID) | ORAL | 0 refills | Status: DC | PRN

## 2018-07-22 MED ORDER — PROCHLORPERAZINE MALEATE 10 MG PO TABS: 10.0000 mg | ORAL_TABLET | Freq: Four times a day (QID) | ORAL | 1 refills | Status: DC | PRN

## 2018-07-22 MED ORDER — DEXAMETHASONE 4 MG PO TABS: 8.0000 mg | ORAL_TABLET | Freq: Every day | ORAL | 1 refills | Status: DC

## 2018-07-22 MED ORDER — LIDOCAINE-PRILOCAINE 2.5-2.5 % EX CREA: TOPICAL_CREAM | CUTANEOUS | 3 refills | Status: DC

## 2018-07-22 NOTE — Progress Notes (Signed)
Hematology/Oncology Consult note [facility] Telephone:([phone] [phone]  Patient Care Team: Baxter Hire, MD as PCP - General (Internal Medicine) Martinique, Peter M, MD as PCP - Cardiology (Cardiology) Clent Jacks, RN as Registered Nurse   Name of the patient: Margaret [name]  [id number]  August 02, 1940    Reason for referral-new diagnosis of serous cystadenocarcinoma of the ovary   Referring physician-Dr. Fransisca Connors  Date of visit: 07/22/18   History of presenting illness- Patient is a 78 year old female with multiple medical problems including gastric bypass, coronary stent placement.  She is on Eliquis and Plavix.    She was initially seen by GI for iron deficiency anemia and positive stool occult.  Endoscopy did not reveal any source of bleeding or malignancy.  Colonoscopy could not be completed as patient was retching during the procedure and plan was to reschedule colonoscopy under intubation.  During that work-up she underwent a CT abdomen and which showed a 4.6 cm right ovarian mass which was initially presumed to be a cyst.  Patient has had a total vaginal hysterectomy done about 20 years ago.  She was seen by Dr. Ouida Sills and underwent laparoscopic bilateral salpingo-oophorectomy with lysis of adhesions on 06/24/2018.  Pathology showed microscopic focus of high-grade serous carcinoma involving both the left and the right ovary  DIAGNOSIS:  A. OVARY AND FALLOPIAN TUBE, LEFT; SALPINGO-OOPHORECTOMY:  OVARY  - MICROSCOPIC FOCUS OF HIGH-GRADE SEROUS CARCINOMA, SEE COMMENT.  - SEROUS CYST ADENOFIBROMA.  - MUCINOUS CYSTADENOMA WITH FOCAL PROLIFERATIVE FEATURES.  - FOCI SUGGESTIVE OF ENDOMETRIOSIS.  - FALLOPIAN TUBE WITH BENIGN PARATUBAL CYST.   B. OVARY AND FALLOPIAN TUBE, RIGHT; SALPINGO-OOPHORECTOMY:  OVARY  - THREE MICROSCOPIC FOCI OF HIGH-GRADE SEROUS CARCINOMA, SEE COMMENT.  - OVARY WITH SEROUS CYST ADENOFIBROMA.  - ENDOMETRIOSIS.   - FIBROUS SEROSAL ADHESIONS.  - FALLOPIAN TUBE WITH BENIGN PARATUBAL CYSTS.    CT chest abdomen and pelvis on 07/12/2018 showed a right lower lobe pulmonary nodule mildly larger than September 2019 which raises the concern for malignancy.  No other evidence of malignancy elsewhere.  Patient then underwent a PET scan on 07/19/2018 which showed a right lower lobe lung nodule was not hypermetabolic with an SUV of 1.7 below mediastinal background activity and no enlarged hypermetabolic mediastinal or hilar lymph nodes.  This was considered likely benign  Patient's case was discussed with GYN conference and patient saw Dr. Fransisca Connors as well and adjuvant chemotherapy with carboplatin and Taxol was recommended given high risk of recurrence.  Baseline Ca1 25 was normal  Patient is currently doing well since her surgery.  Reports no active bleeding in her stool or urine.  Reports mild chronic fatigue.  Her appetite is good and she denies any unintentional weight loss.  ECOG PS- 1  Pain scale- 0   Review of systems- Review of Systems  Constitutional: Positive for malaise/fatigue. Negative for chills, fever and weight loss.  HENT: Negative for congestion, ear discharge and nosebleeds.   Eyes: Negative for blurred vision.  Respiratory: Negative for cough, hemoptysis, sputum production, shortness of breath and wheezing.   Cardiovascular: Negative for chest pain, palpitations, orthopnea and claudication.  Gastrointestinal: Negative for abdominal pain, blood in stool, constipation, diarrhea, heartburn, melena, nausea and vomiting.  Genitourinary: Negative for dysuria, flank pain, frequency, hematuria and urgency.  Musculoskeletal: Negative for back pain, joint pain and myalgias.  Skin: Negative for rash.  Neurological: Negative for dizziness, tingling, focal weakness, seizures, weakness and headaches.  Endo/Heme/Allergies: Does not bruise/bleed  easily.  Psychiatric/Behavioral: Negative for depression and  suicidal ideas. The patient does not have insomnia.     Allergies  Allergen Reactions  . Erythromycin Base Nausea And Vomiting  . Tape Itching, Rash and Other (See Comments)    Also "band-aids"    Patient Active Problem List   Diagnosis Date Noted  . Goals of care, counseling/discussion 07/22/2018  . Primary high grade serous adenocarcinoma of ovary ([place]) 07/13/2018  . Bradycardia 01/13/2018  . NSTEMI (non-ST elevated myocardial infarction) ([place]) 06/14/2017  . History of ischemic right PCA stroke 03/27/2016  . Hemianopia, homonymous, left 01/31/2016  . Arthritis of knee, degenerative 05/04/2013  . Diastolic dysfunction 79/07/4095  . CAD S/P percutaneous coronary angioplasty   . Hypertension   . Hyperlipidemia   . Morbid obesity ([place])   . Right shoulder pain   . Anxiety and depression   . Dyspnea on exertion   . Cellulitis of right leg      Past Medical History:  Diagnosis Date  . Acetabular fracture ([place])   . Anxiety and depression   . Bartholin cyst   . CAD (coronary artery disease)   . Cancer ([place])    mouth cyst was cancer many years ago   . Cellulitis of right leg 2005  . CHF (congestive heart failure) ([place])   . Colon polyp   . Complication of anesthesia   . Diabetes mellitus without complication ([place])   . DJD (degenerative joint disease)   . Dyspnea on exertion   . Dysrhythmia    PAF  . GERD (gastroesophageal reflux disease)   . Hyperlipidemia   . Hypertension   . Morbid obesity ([place])   . Myocardial infarction ([place]) 06/14/2017  . PAF (paroxysmal atrial fibrillation) ([place])    a. diagnosed 7/19; b. CHADS2VASc 8; c. Eliquis  . PONV (postoperative nausea and vomiting)   . Presence of stent in right coronary artery 02/08/12   2.5x18 Xience distal RCA  . Right shoulder pain   . Stroke ([place]) 2017  . Vaginal prolapse      Past Surgical History:  Procedure Laterality Date  . ABDOMINAL HYSTERECTOMY     supracervical abdominal w/removal tubes &/or ovaries    . BREAST BIOPSY  1989  . CHOLECYSTECTOMY    . COLONOSCOPY    . COLONOSCOPY WITH PROPOFOL N/A 06/21/2018   Procedure: COLONOSCOPY WITH PROPOFOL;  Surgeon: Lollie Sails, MD;  Location: [facility] ENDOSCOPY;  Service: Endoscopy;  Laterality: N/A;  . CORONARY STENT INTERVENTION N/A 06/14/2017   Procedure: CORONARY STENT INTERVENTION;  Surgeon: Wellington Hampshire, MD;  Location: [place] CV LAB;  Service: Cardiovascular;  Laterality: N/A;  . ESOPHAGOGASTRODUODENOSCOPY N/A 06/21/2018   Procedure: ESOPHAGOGASTRODUODENOSCOPY (EGD);  Surgeon: Lollie Sails, MD;  Location: [facility] ENDOSCOPY;  Service: Endoscopy;  Laterality: N/A;  . FACIAL COSMETIC SURGERY     face/neck lift  . FRACTURE SURGERY    . GASTRIC BYPASS  2008  . JOINT REPLACEMENT    . KNEE SURGERY     left knee arthroscopic  . LAPAROSCOPIC BILATERAL SALPINGO OOPHERECTOMY Bilateral 06/24/2018   Procedure: LAPAROSCOPIC BILATERAL SALPINGO OOPHORECTOMY;  Surgeon: Schermerhorn, Gwen Her, MD;  Location: [facility] ORS;  Service: Gynecology;  Laterality: Bilateral;  . LEFT HEART CATH AND CORONARY ANGIOGRAPHY N/A 06/14/2017   Procedure: LEFT HEART CATH AND CORONARY ANGIOGRAPHY;  Surgeon: Wellington Hampshire, MD;  Location: [place] CV LAB;  Service: Cardiovascular;  Laterality: N/A;  . LEFT HEART CATH AND CORONARY ANGIOGRAPHY N/A 06/14/2017   Procedure:  LEFT HEART CATH AND CORONARY ANGIOGRAPHY;  Surgeon: Wellington Hampshire, MD;  Location: [place] CV LAB;  Service: Cardiovascular;  Laterality: N/A;  . LYSIS OF ADHESION  06/24/2018   Procedure: LYSIS OF ADHESION;  Surgeon: Ouida Sills Gwen Her, MD;  Location: [facility] ORS;  Service: Gynecology;;  . Crisoforo [place]   left hip fracture/repair and left arm fracture  . OTHER SURGICAL HISTORY  1986   hysterectomy  . right knee  late 90's   arthroscopic  . ROUX-EN-Y PROCEDURE    . SALIVARY GLAND SURGERY    . TOTAL HIP ARTHROPLASTY  2004   left    Social History   Socioeconomic History  .  Marital status: Married    Spouse name: Not on file  . Number of children: 1  . Years of education: Not on file  . Highest education level: Not on file  Occupational History  . Occupation: nurse-retired  Social Needs  . Financial resource strain: Not on file  . Food insecurity:    Worry: Not on file    Inability: Not on file  . Transportation needs:    Medical: Not on file    Non-medical: Not on file  Tobacco Use  . Smoking status: Former Smoker    Packs/day: 2.00    Years: 26.00    Pack years: 52.00    Types: Cigarettes    Last attempt to quit: 06/22/1984    Years since quitting: 34.1  . Smokeless tobacco: Never Used  Substance and Sexual Activity  . Alcohol use: Yes    Alcohol/week: 0.0 standard drinks    Frequency: Never    Comment: rare- less than once a month  . Drug use: No  . Sexual activity: Yes    Birth control/protection: Surgical    Comment: hysterectomy  Lifestyle  . Physical activity:    Days per week: Not on file    Minutes per session: Not on file  . Stress: Not on file  Relationships  . Social connections:    Talks on phone: Not on file    Gets together: Not on file    Attends religious service: Not on file    Active member of club or organization: Not on file    Attends meetings of clubs or organizations: Not on file    Relationship status: Not on file  . Intimate partner violence:    Fear of current or ex partner: Not on file    Emotionally abused: Not on file    Physically abused: Not on file    Forced sexual activity: Not on file  Other Topics Concern  . Not on file  Social History Narrative  . Not on file     Family History  Problem Relation Age of Onset  . Heart attack Father        MI age 88 and 78  . Heart attack Sister 63       cabg; deceased 30  . Diabetes Sister   . Hypertension Sister   . Heart attack Brother 56       cabg; deceased 24s  . Diabetes Brother   . Colon cancer Mother 61       deceased 58  . Diabetes Maternal  Grandmother   . Cancer Maternal Grandfather        unknown type; deceased  . Hypertension Daughter   . Thyroid disease Daughter   . Breast cancer Neg Hx      Current Outpatient Medications:  .  amLODipine (NORVASC) 2.5 MG tablet, Take 1 tablet (2.5 mg total) by mouth daily., Disp: 90 tablet, Rfl: 3 .  apixaban (ELIQUIS) 5 MG TABS tablet, Take 1 tablet (5 mg total) by mouth 2 (two) times daily., Disp: 60 tablet, Rfl: 0 .  clopidogrel (PLAVIX) 75 MG tablet, Take 1 tablet (75 mg total) by mouth daily., Disp: 30 tablet, Rfl: 0 .  cyanocobalamin (,VITAMIN B-12,) 1000 MCG/ML injection, Inject 1,000 mcg into the muscle every 30 (thirty) days., Disp: , Rfl:  .  ergocalciferol (VITAMIN D2) 1.25 MG (50000 UT) capsule, Take 50,000 Units by mouth every Monday., Disp: , Rfl:  .  ezetimibe (ZETIA) 10 MG tablet, TAKE 1 TABLET(10 MG) BY MOUTH DAILY (Patient taking differently: Take 10 mg by mouth daily. ), Disp: 30 tablet, Rfl: 5 .  HYDROcodone-acetaminophen (NORCO/VICODIN) 5-325 MG tablet, Take 1 tablet by mouth at bedtime as needed for moderate pain. , Disp: , Rfl: 0 .  Multiple Vitamin (MULTIVITAMIN WITH MINERALS) TABS tablet, Take 1 tablet by mouth daily., Disp: 30 tablet, Rfl: 1 .  Multiple Vitamins-Minerals (OCUVITE PO), Take 1 capsule by mouth daily., Disp: , Rfl:  .  nitroGLYCERIN (NITROSTAT) 0.4 MG SL tablet, Place 1 tablet (0.4 mg total) under the tongue every 5 (five) minutes as needed for chest pain., Disp: 30 tablet, Rfl: 2 .  ondansetron (ZOFRAN) 4 MG tablet, Take 4 mg by mouth every 8 (eight) hours as needed for nausea or vomiting., Disp: , Rfl:  .  protein supplement shake (PREMIER PROTEIN) LIQD, Take 325 mLs (11 oz total) by mouth 2 (two) times daily between meals., Disp: 60 Can, Rfl: 0 .  rosuvastatin (CRESTOR) 40 MG tablet, Take 1 tablet (40 mg total) by mouth daily., Disp: 30 tablet, Rfl: 3 .  senna-docusate (SENNA S) 8.6-50 MG tablet, Take 1 capsule by mouth daily. , Disp: , Rfl:  .   sertraline (ZOLOFT) 100 MG tablet, Take 100 mg by mouth daily. , Disp: , Rfl:  .  losartan (COZAAR) 50 MG tablet, Take 1 tablet (50 mg total) by mouth daily., Disp: 90 tablet, Rfl: 3   Physical exam:  Vitals:   07/21/18 0942 07/21/18 0949  BP:  (!) 152/85  Pulse:  72  Resp: 12   Temp:  98.7 F (37.1 C)  TempSrc:  Tympanic  Weight: 179 lb 8 oz (81.4 kg)   Height: 5\' 8"  (1.727 m)    Physical Exam Constitutional:      Comments: Elderly female in no acute distress  HENT:     Head: Normocephalic and atraumatic.  Eyes:     Pupils: Pupils are equal, round, and reactive to light.  Neck:     Musculoskeletal: Normal range of motion.  Cardiovascular:     Rate and Rhythm: Normal rate and regular rhythm.     Heart sounds: Normal heart sounds.  Pulmonary:     Effort: Pulmonary effort is normal.     Breath sounds: Normal breath sounds.  Abdominal:     General: Bowel sounds are normal.     Palpations: Abdomen is soft.     Comments: Scar of recent surgery is well-healed.  Prior scar of open cholecystectomy seen in the right upper quadrant  Skin:    General: Skin is warm and dry.  Neurological:     Mental Status: She is alert and oriented to person, place, and time.        CMP Latest Ref Rng & Units 06/24/2018  Glucose 70 - 99 mg/dL  118(H)  BUN 8 - 23 mg/dL -  Creatinine 0.44 - 1.00 mg/dL -  Sodium 135 - 145 mmol/L 144  Potassium 3.5 - 5.1 mmol/L 4.0  Chloride 98 - 111 mmol/L -  CO2 22 - 32 mmol/L -  Calcium 8.9 - 10.3 mg/dL -  Total Protein 6.0 - 8.5 g/dL -  Total Bilirubin 0.0 - 1.2 mg/dL -  Alkaline Phos 39 - 117 IU/L -  AST 0 - 40 IU/L -  ALT 0 - 32 IU/L -   CBC Latest Ref Rng & Units 06/24/2018  WBC 4.0 - 10.5 K/uL -  Hemoglobin 12.0 - 15.0 g/dL 12.9  Hematocrit 36.0 - 46.0 % 38.0  Platelets 150 - 400 K/uL -    No images are attached to the encounter.  Ct Chest W Contrast  Result Date: 07/12/2018 CLINICAL DATA:  Ovarian malignancy restaging.  Lung nodule. EXAM: CT  CHEST, ABDOMEN, AND PELVIS WITH CONTRAST TECHNIQUE: Multidetector CT imaging of the chest, abdomen and pelvis was performed following the standard protocol during bolus administration of intravenous contrast. CONTRAST:  139mL OMNIPAQUE IOHEXOL 300 MG/ML  SOLN COMPARISON:  02/25/2018 FINDINGS: CT CHEST FINDINGS Cardiovascular: Coronary, aortic arch, and branch vessel atherosclerotic vascular disease. Mild ascending aortic ectasia at 3.6 cm. Mild cardiomegaly. Mediastinum/Nodes: Stable anterior mediastinal fluid density lesion with the upper component measuring 1.7 cm transverse, previously 1.8 cm transverse, probably a pericardial cyst. Contrast medium in the esophagus could reflect dysmotility. No pathologic adenopathy in the chest. Lungs/Pleura: Biapical pleuroparenchymal scarring. Sub solid 4 mm nodule in the right upper lobe on image 48/4, stable. 3 by 2 mm right middle lobe nodule on image 71/4, stable. Right lower lobe nodule measures 1.2 by 0.9 cm on image 99/4, formerly 0.8 by 0.6 cm by my measurement. Bandlike scarring or atelectasis along the left inferior pulmonary ligament. Musculoskeletal: Thoracic spondylosis. CT ABDOMEN PELVIS FINDINGS Hepatobiliary: Cholecystectomy.  Otherwise unremarkable. Pancreas: Unremarkable Spleen: Unremarkable Adrenals/Urinary Tract: Unremarkable Stomach/Bowel: Gastric bypass. Vascular/Lymphatic: Aortoiliac atherosclerotic vascular disease. No pathologic adenopathy identified. Reproductive: Uterus absent. Interval salpingo oophorectomy with no residual lesion along the adnexa. Other: No ascites or definite nodularity along the omentum or mesentery to suggest peritoneal spread of disease at this time. Musculoskeletal: Left hip prosthesis and prior plate and screw acetabular repair. Evidence of remote right pubic ramus fractures. Grade 1 degenerative anterolisthesis at L4-5. IMPRESSION: 1. The right lower lobe pulmonary nodule along a bandlike density currently measures mildly  larger than it did on 02/25/2018. This enlargement raises concern for malignancy. Proximity to the diaphragm could make percutaneous biopsy difficult. Consider PET-CT and/or referral to multidisciplinary cancer conference. 2. Interval bilateral salpingo oophorectomy, with no findings of malignancy in the abdomen/pelvis. 3. Stable fluid density lesion along the anterior mediastinum, most likely a pericardial cyst. 4. Other imaging findings of potential clinical significance: Aortic Atherosclerosis (ICD10-I70.0). Coronary atherosclerosis. Mild cardiomegaly. Two tiny stable nodules in the right lung. Electronically Signed   By: Van Clines M.D.   On: 07/12/2018 14:12   Ct Abdomen Pelvis W Contrast  Result Date: 07/12/2018 CLINICAL DATA:  Ovarian malignancy restaging.  Lung nodule. EXAM: CT CHEST, ABDOMEN, AND PELVIS WITH CONTRAST TECHNIQUE: Multidetector CT imaging of the chest, abdomen and pelvis was performed following the standard protocol during bolus administration of intravenous contrast. CONTRAST:  175mL OMNIPAQUE IOHEXOL 300 MG/ML  SOLN COMPARISON:  02/25/2018 FINDINGS: CT CHEST FINDINGS Cardiovascular: Coronary, aortic arch, and branch vessel atherosclerotic vascular disease. Mild ascending aortic ectasia at 3.6 cm. Mild cardiomegaly. Mediastinum/Nodes: Stable  anterior mediastinal fluid density lesion with the upper component measuring 1.7 cm transverse, previously 1.8 cm transverse, probably a pericardial cyst. Contrast medium in the esophagus could reflect dysmotility. No pathologic adenopathy in the chest. Lungs/Pleura: Biapical pleuroparenchymal scarring. Sub solid 4 mm nodule in the right upper lobe on image 48/4, stable. 3 by 2 mm right middle lobe nodule on image 71/4, stable. Right lower lobe nodule measures 1.2 by 0.9 cm on image 99/4, formerly 0.8 by 0.6 cm by my measurement. Bandlike scarring or atelectasis along the left inferior pulmonary ligament. Musculoskeletal: Thoracic spondylosis.  CT ABDOMEN PELVIS FINDINGS Hepatobiliary: Cholecystectomy.  Otherwise unremarkable. Pancreas: Unremarkable Spleen: Unremarkable Adrenals/Urinary Tract: Unremarkable Stomach/Bowel: Gastric bypass. Vascular/Lymphatic: Aortoiliac atherosclerotic vascular disease. No pathologic adenopathy identified. Reproductive: Uterus absent. Interval salpingo oophorectomy with no residual lesion along the adnexa. Other: No ascites or definite nodularity along the omentum or mesentery to suggest peritoneal spread of disease at this time. Musculoskeletal: Left hip prosthesis and prior plate and screw acetabular repair. Evidence of remote right pubic ramus fractures. Grade 1 degenerative anterolisthesis at L4-5. IMPRESSION: 1. The right lower lobe pulmonary nodule along a bandlike density currently measures mildly larger than it did on 02/25/2018. This enlargement raises concern for malignancy. Proximity to the diaphragm could make percutaneous biopsy difficult. Consider PET-CT and/or referral to multidisciplinary cancer conference. 2. Interval bilateral salpingo oophorectomy, with no findings of malignancy in the abdomen/pelvis. 3. Stable fluid density lesion along the anterior mediastinum, most likely a pericardial cyst. 4. Other imaging findings of potential clinical significance: Aortic Atherosclerosis (ICD10-I70.0). Coronary atherosclerosis. Mild cardiomegaly. Two tiny stable nodules in the right lung. Electronically Signed   By: Van Clines M.D.   On: 07/12/2018 14:12   Nm Pet Image Initial (pi) Skull Base To Thigh  Result Date: 07/19/2018 CLINICAL DATA:  Initial treatment strategy for ovarian cancer. Enlarging right lower lobe pulmonary nodule. EXAM: NUCLEAR MEDICINE PET SKULL BASE TO THIGH TECHNIQUE: 9.75 mCi F-18 FDG was injected intravenously. Full-ring PET imaging was performed from the skull base to thigh after the radiotracer. CT data was obtained and used for attenuation correction and anatomic localization.  Fasting blood glucose: 109 mg/dl COMPARISON:  CT scan 07/12/2018 and 02/27/2018 FINDINGS: Mediastinal blood pool activity: SUV max 2.69 NECK: No hypermetabolic lymph nodes in the neck. Incidental CT findings: Remote right occipital infarct is noted. CHEST: The right lower lobe pulmonary nodule is not hypermetabolic. SUV max is 1.7 which is below mediastinal background activity. No enlarged or hypermetabolic mediastinal or hilar lymph nodes. The anterior mediastinal nodule is stable and shows no hypermetabolism. No breast masses, supraclavicular or axillary adenopathy. Incidental CT findings: Stable extensive three-vessel and aortic atherosclerotic calcifications. ABDOMEN/PELVIS: No abnormal hypermetabolic activity within the liver, pancreas, adrenal glands, or spleen. No hypermetabolic lymph nodes in the abdomen or pelvis. Surgical changes noted in the left lower inguinal region with mild associated mild hypermetabolism. Incidental CT findings: Advanced atherosclerotic calcifications involving the aorta and iliac arteries. Surgical changes from gastric bypass surgery. No complicating features. Status post cholecystectomy.  No biliary dilatation. SKELETON: No significant findings. A left total hip arthroplasty is noted. Incidental CT findings: none IMPRESSION: 1. The right lower lobe pulmonary nodule is not hypermetabolic and likely benign. Recommend continued routine surveillance. 2. Anterior mediastinal nodule is not hypermetabolic and also likely benign. 3. No significant findings in the abdomen/pelvis. No omental or peritoneal surface lesions or adenopathy. Electronically Signed   By: Marijo Sanes M.D.   On: 07/19/2018 13:53    Assessment and  plan- Patient is a 78 y.o. female with newly diagnosed stage II high-grade serous adenocarcinoma of the ovary T2 NX M0 here to discuss adjuvant treatment options  I have reviewed PET/CT scan images independently and discussed findings with the patient and her daughter.   Patient was found to have a right lower lobe lung nodule which is initially concerning for malignancy on the CT scan but did not show any hypermetabolism on the PET scan and is likely benign.  We will hold off on any biopsies at this time and continue following this lung nodule in the future.  Also discussed the PET CT scan did not show any evidence of metastatic disease.  At the time of bilateral salpingo-oophorectomy her lymph nodes were not sampled but given that there is no overt hypermetabolism on PET scan she likely has N0 disease making this stage II.  Discussed that she is high risk given her serous histology and therefore at risk of recurrence without any adjuvant treatment.  Chemotherapy has been shown to improve progression free survival and especially NCSS collagen there was some improvement in overall survival seen as well.  I would therefore recommend up to 6 cycles of chemotherapy with carboplatin and AUC 5 and Taxol at 175 mg/m with growth factor support.  Discussed risks and benefits of chemotherapy including but were not limited to fatigue, nausea, vomiting, risk of low blood counts, infections and hospitalization.  Risk of peripheral neuropathy infusion reaction and hair loss associated with Taxol.  Treatment will be given with a curative intent.  Patient understands and agrees to proceed as planned.  Patient is elderly but is functional and does not have any baseline neuropathy.  I will proceed with chemotherapy cautiously with an aim to complete 6 cycles but if she were to develop any worsening side effects that would impact her quality of life like worsening neuropathy I will consider stopping her chemotherapy before 6 cycles.  We will plan to get port placement with vascular surgery and we will have to touch base with her cardiologist regarding holding her Eliquis but hopefully she could continue on Plavix for the port.  We will also plan to send her for chemotherapy teach and I will  tentatively see her back in 1 week's time to start her chemotherapy given that we are almost 4 weeks out from her surgery and I would not like to delay chemotherapy any further.  When her history of ovarian cancer we will also send her for genetic counseling  Cancer Staging Primary high grade serous adenocarcinoma of ovary ([place]) Staging form: Ovary, Fallopian Tube, and Primary Peritoneal Carcinoma, AJCC 8th Edition - Pathologic stage from 07/21/2018: FIGO Stage II, calculated as Stage Unknown (pT2, pNX, cM0) - Signed by Sindy Guadeloupe, MD on 07/22/2018   Total face to face encounter time for this patient visit was 60 min. >50% of the time was  spent in counseling and coordination of care.     Thank you for this kind referral and the opportunity to participate in the care of this Patient   Visit Diagnosis 1. Goals of care, counseling/discussion   2. 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 Randa Evens, MD, MPH [facility] at [facility] [phone] 07/22/2018  12:39 PM

## 2018-07-22 NOTE — Telephone Encounter (Signed)
Called the patient to let her know that her Port-A-Cath can be put in on February 4.  Arrival [place] the medical mall at 1:15.  Nothing to eat or drink when she wakes up that day.  I told her basically when she goes to bed she cannot have anything else after that, till after her procedure.  Continue her Plavix.  She has to stop Eliquis starting tomorrow.  No note Eliquis Saturday no Eliquis Sunday no Eliquis Monday.  Tuesday after the procedure she can start back if the vascular doctor says it is okay that evening.  Patient told me that her cardiologist said she should come off the medicine for 5 days.  I told her that every vascular doctor is different and the doctor told us only 3 days she should just go by these guidelines.  Is agreeable to the plan and have also called her daughter Margaret [name] and told her all the above 2 and they are both in with the same plan

## 2018-07-22 NOTE — Progress Notes (Signed)
START ON PATHWAY REGIMEN - Ovarian     A cycle is every 21 days:     Paclitaxel      Carboplatin   **Always confirm dose/schedule in your pharmacy ordering system**  Patient Characteristics: Postoperative without Neoadjuvant Therapy (Pathologic Staging), Newly Diagnosed, Adjuvant Therapy, Stage IIA Therapeutic Status: Postoperative without Neoadjuvant Therapy (Pathologic Staging) BRCA Mutation Status: Awaiting Test Results AJCC 8 Stage Grouping: Unknown AJCC M Category: cM0 AJCC T Category: pT2 AJCC N Category: pNX Intent of Therapy: Curative Intent, Discussed with Patient

## 2018-07-25 ENCOUNTER — Telehealth (INDEPENDENT_AMBULATORY_CARE_PROVIDER_SITE_OTHER): Payer: Self-pay

## 2018-07-25 ENCOUNTER — Inpatient Hospital Stay: Payer: PPO | Admitting: Hospice and Palliative Medicine

## 2018-07-25 ENCOUNTER — Inpatient Hospital Stay: Payer: PPO

## 2018-07-25 MED ORDER — DEXTROSE 5 % IV SOLN
2.0000 g | Freq: Once | INTRAVENOUS | Status: AC
  Administered 2018-07-26: 2 g via INTRAVENOUS
  Filled 2018-07-25: qty 20

## 2018-07-25 NOTE — Telephone Encounter (Signed)
[name] the patients daughter called and left a message on the triage line and stated that her mom was called today and given pre-op instructions and can not remember what they are. [name] is on the patients DPR. She said her mother has been holding her Eliquis per cardiology but that is all she remembered to do. Please advise the patients daughter on instructions

## 2018-07-26 ENCOUNTER — Other Ambulatory Visit: Payer: Self-pay | Admitting: *Deleted

## 2018-07-26 ENCOUNTER — Other Ambulatory Visit: Payer: Self-pay

## 2018-07-26 ENCOUNTER — Ambulatory Visit
Admission: RE | Admit: 2018-07-26 | Discharge: 2018-07-26 | Disposition: A | Discharge status: Home / Self Care | Payer: PPO | Attending: Vascular Surgery | Admitting: Vascular Surgery

## 2018-07-26 ENCOUNTER — Encounter
Admission: RE | Disposition: A | Discharge status: Home / Self Care | Payer: Self-pay | Source: Home / Self Care | Attending: Vascular Surgery

## 2018-07-26 DIAGNOSIS — C569 Malignant neoplasm of unspecified ovary: Secondary | ICD-10-CM

## 2018-07-26 HISTORY — PX: PORTA CATH INSERTION: CATH118285

## 2018-07-26 SURGERY — PORTA CATH INSERTION
Anesthesia: Moderate Sedation

## 2018-07-26 MED ORDER — FENTANYL CITRATE (PF) 100 MCG/2ML IJ SOLN
INTRAMUSCULAR | Status: DC | PRN
  Administered 2018-07-26: 50 ug via INTRAVENOUS

## 2018-07-26 MED ORDER — HYDROMORPHONE HCL 1 MG/ML IJ SOLN: 1.0000 mg | Freq: Once | INTRAMUSCULAR | Status: DC | PRN

## 2018-07-26 MED ORDER — MIDAZOLAM HCL 2 MG/2ML IJ SOLN
INTRAMUSCULAR | Status: DC | PRN
  Administered 2018-07-26: 2 mg via INTRAVENOUS

## 2018-07-26 MED ORDER — LIDOCAINE-EPINEPHRINE (PF) 1 %-1:200000 IJ SOLN
INTRAMUSCULAR | Status: AC
  Filled 2018-07-26: qty 30

## 2018-07-26 MED ORDER — DIPHENHYDRAMINE HCL 50 MG/ML IJ SOLN: 50.0000 mg | Freq: Once | INTRAMUSCULAR | Status: DC | PRN

## 2018-07-26 MED ORDER — SODIUM CHLORIDE 0.9 % IV SOLN
INTRAVENOUS | Status: DC
  Administered 2018-07-26: 14:00:00 via INTRAVENOUS

## 2018-07-26 MED ORDER — MIDAZOLAM HCL 5 MG/5ML IJ SOLN
INTRAMUSCULAR | Status: AC
  Filled 2018-07-26: qty 5

## 2018-07-26 MED ORDER — METHYLPREDNISOLONE SODIUM SUCC 125 MG IJ SOLR: 125.0000 mg | Freq: Once | INTRAMUSCULAR | Status: DC | PRN

## 2018-07-26 MED ORDER — ONDANSETRON HCL 4 MG/2ML IJ SOLN: 4.0000 mg | Freq: Four times a day (QID) | INTRAMUSCULAR | Status: DC | PRN

## 2018-07-26 MED ORDER — FAMOTIDINE 20 MG PO TABS: 40.0000 mg | ORAL_TABLET | Freq: Once | ORAL | Status: DC | PRN

## 2018-07-26 MED ORDER — FENTANYL CITRATE (PF) 100 MCG/2ML IJ SOLN
INTRAMUSCULAR | Status: AC
  Filled 2018-07-26: qty 2

## 2018-07-26 MED ORDER — MIDAZOLAM HCL 2 MG/ML PO SYRP: 8.0000 mg | ORAL_SOLUTION | Freq: Once | ORAL | Status: DC | PRN

## 2018-07-26 MED ORDER — MIDAZOLAM HCL 2 MG/2ML IJ SOLN
INTRAMUSCULAR | Status: DC | PRN
  Administered 2018-07-26: 1 mg via INTRAVENOUS

## 2018-07-26 SURGICAL SUPPLY — 10 items
DRAPE INCISE IOBAN 66X45 STRL (DRAPES) ×6 IMPLANT
KIT PORT POWER 8FR ISP CVUE (Port) ×3 IMPLANT
NEEDLE ENTRY 21GA 7CM ECHOTIP (NEEDLE) ×3 IMPLANT
PACK ANGIOGRAPHY (CUSTOM PROCEDURE TRAY) ×3 IMPLANT
PREP CHG 10.5 TEAL (MISCELLANEOUS) ×3 IMPLANT
SET INTRO CAPELLA COAXIAL (SET/KITS/TRAYS/PACK) ×3 IMPLANT
SUT MNCRL AB 4-0 PS2 18 (SUTURE) ×3 IMPLANT
SUT PROLENE 0 CT 1 30 (SUTURE) ×3 IMPLANT
SUT VICRYL+ 3-0 36IN CT-1 (SUTURE) ×3 IMPLANT
TOWEL OR 17X26 4PK STRL BLUE (TOWEL DISPOSABLE) ×3 IMPLANT

## 2018-07-26 NOTE — Op Note (Signed)
OPERATIVE NOTE   PROCEDURE: 1. Placement of a right IJ Infuse-a-Port  PRE-OPERATIVE DIAGNOSIS: Stage IIa ovarian CA  POST-OPERATIVE DIAGNOSIS: Same  SURGEON: Katha Cabal M.D.  ANESTHESIA: Conscious sedation was administered under my direct supervision by the interventional radiology RN. IV Versed plus fentanyl were utilized. Continuous ECG, pulse oximetry and blood pressure was monitored throughout the entire procedure. Conscious sedation was for a total of 26 minutes.  ESTIMATED BLOOD LOSS: Minimal   FINDING(S): 1.  Patent vein  SPECIMEN(S): None  INDICATIONS:   Margaret [name] is a 78 y.o. female who presents with stage IIa ovarian carcinoma.  She will require chemotherapy to complete her treatment and therefore appropriate parenteral access.  Risks and benefits for Infuse-a-Port placement have been reviewed all questions answered patient agrees to proceed.  DESCRIPTION: After obtaining full informed written consent, the patient was brought back to the special procedure suite and placed in the supine position. The patient's right neck and chest wall are prepped and draped in sterile fashion. Appropriate timeout was called.  Ultrasound is placed in a sterile sleeve, ultrasound is utilized to avoid vascular injury as well as secondary to lack of appropriate landmarks. The right internal jugular vein is identified. It is echolucent and homogeneous as well as easily compressible indicating patency. An image is recorded for the permanent record.  Access to the vein with a micropuncture needle is done under direct ultrasound visualization.  1% lidocaine is infiltrated into the soft tissue at the base of the neck as well as on the chest wall.  Under direct ultrasound visualization a micro-needle is inserted into the vein followed by the micro-wire. Micro-sheath was then advanced and a J wire is inserted without difficulty under fluoroscopic guidance. A small counterincision was created at  the wire insertion site. A transverse incision is created 2 fingerbreadths below the scapula and a pocket is fashioned using both blunt and sharp dissection. The pocket is tested for appropriate size with the hub of the Infuse-a-Port. The tunneling device is then used to pull the intravascular portion of the catheter from the pocket to the neck counterincision.  Dilator and peel-away sheath were then inserted over the wire and the wire is removed. Catheter is then advanced into the venous system without difficulty. Peel-away sheath was then removed.  Catheter is then positioned under fluoroscopic guidance at the atrial caval junction. It is then transected connected to the hub and the hope is slipped into the subcutaneous pocket on the chest wall. The hub was then accessed percutaneously and aspirates easily and flushes well and is flushed with 30 cc of heparinized saline. The pocket incision is then closed in layers using interrupted 3-0 Vicryl for the subcutaneous tissues and 4-0 Monocryl subcuticular for skin closure. Dermabond is applied. The neck counterincision was closed with 4-0 Monocryl subcuticular and Dermabond as well.  The patient tolerated the procedure well and there were no immediate complications.  COMPLICATIONS: None  CONDITION: Unchanged  Katha Cabal M.D. [facility] Office: [phone]   07/26/2018, 3:31 PM

## 2018-07-26 NOTE — Telephone Encounter (Signed)
I attempted to contact the patient's daughter this morning with the number provided, but that number is her work number and she was not there. The receptionist took my number and stated she would call the daughter and have her call me. I will gladly go over the instructions with the patient's daughter, as I did not speak with the daughter or the patient in regards to this procedure. This procedure was scheduled with Judeen Hammans at the Cancer center and Judeen Hammans called the patient and gave her instructions.  That is perfect and I will call patient and her daughter.  Thank you,  Judeen Hammans  ----- Message -----  From: Devona Konig, CMA  Sent: 07/21/2018 11:29 AM EST  To: Luella Cook, RN   Judeen Hammans  I can put the patient on the schedule with Dr. Delana Meyer for 07/26/2018 with a 1:15 pm arrival time, NPO. Plavix continue Eliquis 3 days before. Will that work ?  Mickel Baas  ----- Message -----  From: Luella Cook, RN  Sent: 07/21/2018 10:37 AM EST  To: Devona Konig, CMA   Mickel Baas, we need port ASap. The patient is on plavix and eliquis. She is going for chemo class-2/3 and would like to start chemo 2/6. If she can't get it on wed. Due to being off the meds. Then we will proceed with treatment IV-peripheral and then work out port. What are your guidelines for the plavix and eliquis to come off for the port placement.  Thanks  [employer]

## 2018-07-26 NOTE — H&P (Signed)
 [facility] History & Physical Update  The patient was interviewed and re-examined.  The patient's previous History and Physical has been reviewed and is unchanged.  There is no change in the plan of care. We plan to proceed with the scheduled procedure.  Hortencia Pilar, MD  07/26/2018, 2:57 PM

## 2018-07-27 ENCOUNTER — Other Ambulatory Visit: Payer: Self-pay | Admitting: *Deleted

## 2018-07-27 ENCOUNTER — Inpatient Hospital Stay (HOSPITAL_BASED_OUTPATIENT_CLINIC_OR_DEPARTMENT_OTHER): Payer: PPO | Admitting: Hospice and Palliative Medicine

## 2018-07-27 ENCOUNTER — Inpatient Hospital Stay: Payer: PPO | Attending: Oncology

## 2018-07-27 ENCOUNTER — Encounter: Payer: Self-pay | Admitting: Vascular Surgery

## 2018-07-27 DIAGNOSIS — I252 Old myocardial infarction: Secondary | ICD-10-CM | POA: Insufficient documentation

## 2018-07-27 DIAGNOSIS — Z9079 Acquired absence of other genital organ(s): Secondary | ICD-10-CM | POA: Insufficient documentation

## 2018-07-27 DIAGNOSIS — Z9884 Bariatric surgery status: Secondary | ICD-10-CM | POA: Insufficient documentation

## 2018-07-27 DIAGNOSIS — F329 Major depressive disorder, single episode, unspecified: Secondary | ICD-10-CM

## 2018-07-27 DIAGNOSIS — Z5111 Encounter for antineoplastic chemotherapy: Secondary | ICD-10-CM | POA: Diagnosis not present

## 2018-07-27 DIAGNOSIS — E119 Type 2 diabetes mellitus without complications: Secondary | ICD-10-CM | POA: Insufficient documentation

## 2018-07-27 DIAGNOSIS — Z87891 Personal history of nicotine dependence: Secondary | ICD-10-CM

## 2018-07-27 DIAGNOSIS — C569 Malignant neoplasm of unspecified ovary: Secondary | ICD-10-CM

## 2018-07-27 DIAGNOSIS — Z90722 Acquired absence of ovaries, bilateral: Secondary | ICD-10-CM | POA: Diagnosis not present

## 2018-07-27 DIAGNOSIS — G8929 Other chronic pain: Secondary | ICD-10-CM | POA: Diagnosis not present

## 2018-07-27 DIAGNOSIS — I480 Paroxysmal atrial fibrillation: Secondary | ICD-10-CM | POA: Insufficient documentation

## 2018-07-27 DIAGNOSIS — Z79899 Other long term (current) drug therapy: Secondary | ICD-10-CM | POA: Insufficient documentation

## 2018-07-27 DIAGNOSIS — Z8673 Personal history of transient ischemic attack (TIA), and cerebral infarction without residual deficits: Secondary | ICD-10-CM | POA: Insufficient documentation

## 2018-07-27 DIAGNOSIS — I2510 Atherosclerotic heart disease of native coronary artery without angina pectoris: Secondary | ICD-10-CM

## 2018-07-27 DIAGNOSIS — D509 Iron deficiency anemia, unspecified: Secondary | ICD-10-CM | POA: Insufficient documentation

## 2018-07-27 DIAGNOSIS — C562 Malignant neoplasm of left ovary: Secondary | ICD-10-CM | POA: Diagnosis not present

## 2018-07-27 DIAGNOSIS — Z515 Encounter for palliative care: Secondary | ICD-10-CM

## 2018-07-27 DIAGNOSIS — I10 Essential (primary) hypertension: Secondary | ICD-10-CM | POA: Insufficient documentation

## 2018-07-27 DIAGNOSIS — F32A Depression, unspecified: Secondary | ICD-10-CM

## 2018-07-27 DIAGNOSIS — Z90710 Acquired absence of both cervix and uterus: Secondary | ICD-10-CM | POA: Diagnosis not present

## 2018-07-27 DIAGNOSIS — I509 Heart failure, unspecified: Secondary | ICD-10-CM

## 2018-07-27 DIAGNOSIS — Z7901 Long term (current) use of anticoagulants: Secondary | ICD-10-CM | POA: Diagnosis not present

## 2018-07-27 DIAGNOSIS — C561 Malignant neoplasm of right ovary: Secondary | ICD-10-CM | POA: Insufficient documentation

## 2018-07-27 DIAGNOSIS — Z7902 Long term (current) use of antithrombotics/antiplatelets: Secondary | ICD-10-CM | POA: Insufficient documentation

## 2018-07-27 DIAGNOSIS — Z5189 Encounter for other specified aftercare: Secondary | ICD-10-CM | POA: Insufficient documentation
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
## 2018-07-28 ENCOUNTER — Inpatient Hospital Stay: Payer: PPO

## 2018-07-28 ENCOUNTER — Inpatient Hospital Stay (HOSPITAL_BASED_OUTPATIENT_CLINIC_OR_DEPARTMENT_OTHER): Payer: PPO | Admitting: Oncology

## 2018-07-28 ENCOUNTER — Encounter: Payer: Self-pay | Admitting: Oncology

## 2018-07-28 VITALS — BP 150/78 | HR 72 | Resp 18

## 2018-07-28 VITALS — BP 144/75 | HR 67 | Temp 97.6°F | Wt 180.8 lb

## 2018-07-28 DIAGNOSIS — Z7901 Long term (current) use of anticoagulants: Secondary | ICD-10-CM

## 2018-07-28 DIAGNOSIS — C561 Malignant neoplasm of right ovary: Secondary | ICD-10-CM | POA: Diagnosis not present

## 2018-07-28 DIAGNOSIS — Z8673 Personal history of transient ischemic attack (TIA), and cerebral infarction without residual deficits: Secondary | ICD-10-CM

## 2018-07-28 DIAGNOSIS — I809 Phlebitis and thrombophlebitis of unspecified site: Secondary | ICD-10-CM | POA: Diagnosis not present

## 2018-07-28 DIAGNOSIS — Z87891 Personal history of nicotine dependence: Secondary | ICD-10-CM

## 2018-07-28 DIAGNOSIS — F329 Major depressive disorder, single episode, unspecified: Secondary | ICD-10-CM

## 2018-07-28 DIAGNOSIS — D509 Iron deficiency anemia, unspecified: Secondary | ICD-10-CM | POA: Diagnosis not present

## 2018-07-28 DIAGNOSIS — G8929 Other chronic pain: Secondary | ICD-10-CM | POA: Diagnosis not present

## 2018-07-28 DIAGNOSIS — Z90710 Acquired absence of both cervix and uterus: Secondary | ICD-10-CM

## 2018-07-28 DIAGNOSIS — Z9079 Acquired absence of other genital organ(s): Secondary | ICD-10-CM

## 2018-07-28 DIAGNOSIS — I480 Paroxysmal atrial fibrillation: Secondary | ICD-10-CM | POA: Diagnosis not present

## 2018-07-28 DIAGNOSIS — I252 Old myocardial infarction: Secondary | ICD-10-CM | POA: Diagnosis not present

## 2018-07-28 DIAGNOSIS — Z90722 Acquired absence of ovaries, bilateral: Secondary | ICD-10-CM

## 2018-07-28 DIAGNOSIS — C562 Malignant neoplasm of left ovary: Secondary | ICD-10-CM

## 2018-07-28 DIAGNOSIS — C569 Malignant neoplasm of unspecified ovary: Secondary | ICD-10-CM

## 2018-07-28 DIAGNOSIS — Z9884 Bariatric surgery status: Secondary | ICD-10-CM

## 2018-07-28 DIAGNOSIS — Z5111 Encounter for antineoplastic chemotherapy: Secondary | ICD-10-CM

## 2018-07-28 DIAGNOSIS — I10 Essential (primary) hypertension: Secondary | ICD-10-CM | POA: Diagnosis not present

## 2018-07-28 DIAGNOSIS — Z79899 Other long term (current) drug therapy: Secondary | ICD-10-CM

## 2018-07-28 DIAGNOSIS — Z7902 Long term (current) use of antithrombotics/antiplatelets: Secondary | ICD-10-CM

## 2018-07-28 DIAGNOSIS — I2510 Atherosclerotic heart disease of native coronary artery without angina pectoris: Secondary | ICD-10-CM | POA: Diagnosis not present

## 2018-07-28 LAB — COMPREHENSIVE METABOLIC PANEL
ALT: 9 U/L (ref 0–44)
AST: 20 U/L (ref 15–41)
Albumin: 3.8 g/dL (ref 3.5–5.0)
Alkaline Phosphatase: 101 U/L (ref 38–126)
Anion gap: 5 (ref 5–15)
BUN: 20 mg/dL (ref 8–23)
CO2: 24 mmol/L (ref 22–32)
Calcium: 8.7 mg/dL — ABNORMAL LOW (ref 8.9–10.3)
Chloride: 112 mmol/L — ABNORMAL HIGH (ref 98–111)
Creatinine, Ser: 0.84 mg/dL (ref 0.44–1.00)
GFR calc Af Amer: >60 mL/min (ref >60)
GFR calc non Af Amer: >60 mL/min (ref >60)
Glucose, Bld: 202 mg/dL — ABNORMAL HIGH (ref 70–99)
POTASSIUM: 3.4 mmol/L — AB (ref 3.5–5.1)
Sodium: 141 mmol/L (ref 135–145)
Total Bilirubin: 0.6 mg/dL (ref 0.3–1.2)
Total Protein: 6.5 g/dL (ref 6.5–8.1)

## 2018-07-28 LAB — CBC WITH DIFFERENTIAL/PLATELET
ABS IMMATURE GRANULOCYTES: 0.03 10*3/uL (ref 0.00–0.07)
Basophils Absolute: 0 10*3/uL (ref 0.0–0.1)
Basophils Relative: 1 %
Eosinophils Absolute: 0.1 10*3/uL (ref 0.0–0.5)
Eosinophils Relative: 2 %
HCT: 39.4 % (ref 36.0–46.0)
Hemoglobin: 12.6 g/dL (ref 12.0–15.0)
IMMATURE GRANULOCYTES: 1 %
LYMPHS ABS: 1.6 10*3/uL (ref 0.7–4.0)
Lymphocytes Relative: 26 %
MCH: 27.3 pg (ref 26.0–34.0)
MCHC: 32 g/dL (ref 30.0–36.0)
MCV: 85.3 fL (ref 80.0–100.0)
Monocytes Absolute: 0.4 10*3/uL (ref 0.1–1.0)
Monocytes Relative: 6 %
Neutro Abs: 3.8 10*3/uL (ref 1.7–7.7)
Neutrophils Relative %: 64 %
Platelets: 179 10*3/uL (ref 150–400)
RBC: 4.62 MIL/uL (ref 3.87–5.11)
RDW: 15.3 % (ref 11.5–15.5)
WBC: 6 10*3/uL (ref 4.0–10.5)
nRBC: 0 % (ref 0.0–0.2)

## 2018-07-28 MED ORDER — SODIUM CHLORIDE 0.9 % IV SOLN
420.0000 mg | Freq: Once | INTRAVENOUS | Status: AC
  Administered 2018-07-28: 420 mg via INTRAVENOUS
  Filled 2018-07-28: qty 42

## 2018-07-28 MED ORDER — FAMOTIDINE IN NACL 20-0.9 MG/50ML-% IV SOLN
20.0000 mg | Freq: Once | INTRAVENOUS | Status: AC
  Administered 2018-07-28: 20 mg via INTRAVENOUS
  Filled 2018-07-28: qty 50

## 2018-07-28 MED ORDER — PALONOSETRON HCL INJECTION 0.25 MG/5ML
0.2500 mg | Freq: Once | INTRAVENOUS | Status: AC
  Administered 2018-07-28: 0.25 mg via INTRAVENOUS
  Filled 2018-07-28: qty 5

## 2018-07-28 MED ORDER — SODIUM CHLORIDE 0.9 % IV SOLN
Freq: Once | INTRAVENOUS | Status: AC
  Administered 2018-07-28: 10:00:00 via INTRAVENOUS
  Filled 2018-07-28: qty 250

## 2018-07-28 MED ORDER — SODIUM CHLORIDE 0.9% FLUSH
10.0000 mL | INTRAVENOUS | Status: DC | PRN
  Administered 2018-07-28: 10 mL via INTRAVENOUS
  Filled 2018-07-28: qty 10

## 2018-07-28 MED ORDER — HEPARIN SOD (PORK) LOCK FLUSH 100 UNIT/ML IV SOLN
500.0000 [IU] | Freq: Once | INTRAVENOUS | Status: AC
  Administered 2018-07-28: 500 [IU] via INTRAVENOUS
  Filled 2018-07-28: qty 5

## 2018-07-28 MED ORDER — PEGFILGRASTIM 6 MG/0.6ML ~~LOC~~ PSKT
6.0000 mg | PREFILLED_SYRINGE | Freq: Once | SUBCUTANEOUS | Status: AC
  Administered 2018-07-28: 6 mg via SUBCUTANEOUS
  Filled 2018-07-28: qty 0.6

## 2018-07-28 MED ORDER — SODIUM CHLORIDE 0.9 % IV SOLN
175.0000 mg/m2 | Freq: Once | INTRAVENOUS | Status: AC
  Administered 2018-07-28: 348 mg via INTRAVENOUS
  Filled 2018-07-28: qty 58

## 2018-07-28 MED ORDER — SODIUM CHLORIDE 0.9 % IV SOLN
20.0000 mg | Freq: Once | INTRAVENOUS | Status: AC
  Administered 2018-07-28: 20 mg via INTRAVENOUS
  Filled 2018-07-28: qty 2

## 2018-07-28 MED ORDER — DIPHENHYDRAMINE HCL 50 MG/ML IJ SOLN
50.0000 mg | Freq: Once | INTRAMUSCULAR | Status: AC
  Administered 2018-07-28: 50 mg via INTRAVENOUS
  Filled 2018-07-28: qty 1

## 2018-07-28 NOTE — Progress Notes (Signed)
Hematology/Oncology Consult note 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  She is on Eliquis and Plavix.    She was initially seen by GI for iron deficiency anemia and positive stool occult.  Endoscopy did not reveal any source of bleeding or malignancy.  Colonoscopy could not be completed as patient was retching during the procedure and plan was to reschedule colonoscopy under intubation.  During that work-up she underwent a CT abdomen and which showed a 4.6 cm right ovarian mass which was initially presumed to be a cyst.  Patient has had a total vaginal hysterectomy done about 20 years ago.  She was seen by Dr. Ouida Sills and underwent laparoscopic bilateral salpingo-oophorectomy with lysis of adhesions on 06/24/2018.  Pathology showed microscopic focus of high-grade serous carcinoma involving both the left and the right ovary  DIAGNOSIS:  A. OVARY AND FALLOPIAN TUBE, LEFT; SALPINGO-OOPHORECTOMY:  OVARY  - MICROSCOPIC FOCUS OF HIGH-GRADE SEROUS CARCINOMA, SEE COMMENT.  - SEROUS CYST ADENOFIBROMA.  - MUCINOUS CYSTADENOMA WITH FOCAL PROLIFERATIVE FEATURES.  - FOCI SUGGESTIVE OF ENDOMETRIOSIS.  - FALLOPIAN TUBE WITH BENIGN PARATUBAL CYST.   B. OVARY AND FALLOPIAN TUBE, RIGHT; SALPINGO-OOPHORECTOMY:  OVARY  - THREE MICROSCOPIC FOCI OF HIGH-GRADE SEROUS CARCINOMA, SEE COMMENT.    - OVARY WITH SEROUS CYST ADENOFIBROMA.  - ENDOMETRIOSIS.  - FIBROUS SEROSAL ADHESIONS.  - FALLOPIAN TUBE WITH BENIGN PARATUBAL CYSTS.    CT chest abdomen and pelvis on 07/12/2018 showed a right lower lobe pulmonary nodule mildly larger than September 2019 which raises the concern for malignancy.  No other evidence of malignancy elsewhere.  Patient then underwent a PET scan on 07/19/2018 which showed a right lower lobe lung nodule was not hypermetabolic with an SUV of 1.7 below mediastinal background activity and no enlarged hypermetabolic mediastinal or hilar lymph nodes.  This was considered likely benign  Patient's case was discussed with GYN conference and patient saw Dr. Fransisca Connors as well and adjuvant chemotherapy with carboplatin and Taxol was recommended given high risk of recurrence.  Baseline Ca1 25 was normal   Interval history- reports chronic back pain. She had been getting vicodin from her ortho in the past but says they are finding it difficult to prescribe it to her and wondering if we can do it  ECOG PS- 1 Pain scale- 4 Opioid associated constipation- no  Review of systems- Review of Systems  Constitutional: Negative for chills, fever, malaise/fatigue and weight loss.  HENT: Negative for congestion, ear discharge and nosebleeds.   Eyes: Negative for blurred vision.  Respiratory: Negative for cough, hemoptysis, sputum production, shortness of breath and wheezing.   Cardiovascular: Negative for chest pain, palpitations, orthopnea and claudication.  Gastrointestinal: Negative for abdominal pain, blood in stool, constipation, diarrhea, heartburn, melena, nausea and vomiting.  Genitourinary: Negative for dysuria, flank pain, frequency, hematuria and urgency.  Musculoskeletal: Positive for back pain. Negative for joint pain and myalgias.  Skin: Negative for rash.  Neurological: Negative for dizziness, tingling, focal weakness, seizures, weakness and headaches.   Endo/Heme/Allergies: Does not bruise/bleed easily.  Psychiatric/Behavioral: Negative for depression and suicidal ideas. The patient does not have insomnia.     -  Allergies  Allergen Reactions  . Erythromycin Base Nausea And Vomiting  . Tape Itching, Rash and Other (See Comments)    Also "band-aids"     Past Medical History:  Diagnosis Date  . Acetabular fracture ([place])   . Anxiety and depression   . Bartholin cyst   . CAD (coronary artery disease)   . Cancer ([place])    mouth cyst was cancer many years ago   . Cellulitis of right leg 2005  . CHF (congestive heart failure) ([place])   . Colon polyp   . Complication of anesthesia   . Diabetes mellitus without complication ([place])   . DJD (degenerative joint disease)   . Dyspnea on exertion   . Dysrhythmia    PAF  . GERD (gastroesophageal reflux disease)   . Hyperlipidemia   . Hypertension   . Morbid obesity ([place])   . Myocardial infarction ([place]) 06/14/2017  . PAF (paroxysmal atrial fibrillation) ([place])    a. diagnosed 7/19; b. CHADS2VASc 8; c. Eliquis  . PONV (postoperative nausea and vomiting)   . Presence of stent in right coronary artery 02/08/12   2.5x18 Xience distal RCA  . Right shoulder pain   . Stroke ([place]) 2017  . Vaginal prolapse      Past Surgical History:  Procedure Laterality Date  . ABDOMINAL HYSTERECTOMY     supracervical abdominal w/removal tubes &/or ovaries  . BREAST BIOPSY  1989  . CHOLECYSTECTOMY    . COLONOSCOPY    . COLONOSCOPY WITH PROPOFOL N/A 06/21/2018   Procedure: COLONOSCOPY WITH PROPOFOL;  Surgeon: Lollie Sails, MD;  Location: [facility] ENDOSCOPY;  Service: Endoscopy;  Laterality: N/A;  . CORONARY STENT INTERVENTION N/A 06/14/2017   Procedure: CORONARY STENT INTERVENTION;  Surgeon: Wellington Hampshire, MD;  Location: [place] CV LAB;  Service: Cardiovascular;  Laterality: N/A;  . ESOPHAGOGASTRODUODENOSCOPY N/A 06/21/2018   Procedure: ESOPHAGOGASTRODUODENOSCOPY (EGD);  Surgeon: Lollie Sails, MD;  Location: [facility] ENDOSCOPY;  Service: Endoscopy;  Laterality: N/A;  . FACIAL COSMETIC SURGERY     face/neck lift  . FRACTURE SURGERY    . GASTRIC BYPASS  2008  . JOINT REPLACEMENT    . KNEE SURGERY     left knee arthroscopic  . LAPAROSCOPIC BILATERAL SALPINGO OOPHERECTOMY Bilateral 06/24/2018   Procedure: LAPAROSCOPIC BILATERAL SALPINGO OOPHORECTOMY;  Surgeon: Schermerhorn, Gwen Her, MD;  Location: [facility] ORS;  Service: Gynecology;  Laterality: Bilateral;  . LEFT HEART CATH AND CORONARY ANGIOGRAPHY N/A 06/14/2017   Procedure: LEFT HEART CATH AND CORONARY ANGIOGRAPHY;  Surgeon: Wellington Hampshire, MD;  Location: [place] CV LAB;  Service: Cardiovascular;  Laterality: N/A;  . LEFT HEART CATH AND CORONARY ANGIOGRAPHY N/A 06/14/2017   Procedure: LEFT HEART CATH AND CORONARY ANGIOGRAPHY;  Surgeon: Wellington Hampshire, MD;  Location: [place] CV LAB;  Service: Cardiovascular;  Laterality: N/A;  . LYSIS OF ADHESION  06/24/2018   Procedure: LYSIS OF ADHESION;  Surgeon: Ouida Sills Gwen Her, MD;  Location: [facility] ORS;  Service: Gynecology;;  . Crisoforo [place]   left hip fracture/repair and left arm fracture  . OTHER SURGICAL HISTORY  1986   hysterectomy  . PORTA CATH INSERTION N/A 07/26/2018   Procedure: PORTA CATH INSERTION;  Surgeon: Katha Cabal, MD;  Location: [place] CV LAB;  Service: Cardiovascular;  Laterality: N/A;  . right knee  late 90's   arthroscopic  . ROUX-EN-Y PROCEDURE    . SALIVARY GLAND SURGERY    . TOTAL HIP ARTHROPLASTY  2004   left    Social History   Socioeconomic History  . Marital status: Married    Spouse name: Not on file  . Number of children: 1  . Years of education: Not on file  . Highest education level: Not on file  Occupational History  . Occupation: nurse-retired  Social Needs  . Financial resource strain: Not on file  . Food insecurity:    Worry: Not on file    Inability: Not on file  . Transportation needs:    Medical: Not on  file    Non-medical: Not on file  Tobacco Use  . Smoking status: Former Smoker    Packs/day: 2.00    Years: 26.00    Pack years: 52.00    Types: Cigarettes    Last attempt to quit: 06/22/1984    Years since quitting: 34.1  . Smokeless tobacco: Never Used  Substance and Sexual Activity  . Alcohol use: Yes    Alcohol/week: 0.0 standard drinks    Frequency: Never    Comment: rare- less than once a month  . Drug use: No  . Sexual activity: Yes    Birth control/protection: Surgical    Comment: hysterectomy  Lifestyle  . Physical activity:    Days per week: Not on file    Minutes per session: Not on file  . Stress: Not on file  Relationships  . Social connections:    Talks on phone: Not on file    Gets together: Not on file    Attends religious service: Not on file    Active member of club or organization: Not on file    Attends meetings of clubs or organizations: Not on file    Relationship status: Not on file  . Intimate partner violence:    Fear of current or ex partner: Not on file    Emotionally abused: Not on file    Physically abused: Not on file    Forced sexual activity: Not on file  Other Topics Concern  . Not on file  Social History Narrative  . Not on file    Family History  Problem Relation Age of Onset  . Heart attack Father        MI age 84 and 33  . Heart attack Sister 71       cabg; deceased 35  . Diabetes Sister   . Hypertension Sister   . Heart attack Brother 79       cabg; deceased 18s  . Diabetes Brother   . Colon cancer Mother 23       deceased 55  . Diabetes Maternal Grandmother   . Cancer Maternal Grandfather        unknown type; deceased  . Hypertension Daughter   . Thyroid disease Daughter   . Breast cancer Neg Hx      Current Outpatient Medications:  .  amLODipine (NORVASC) 2.5 MG tablet, Take 1 tablet (2.5 mg total) by mouth daily., Disp: 90 tablet, Rfl: 3 .  apixaban (ELIQUIS) 5 MG TABS tablet, Take 1 tablet (5 mg total) by  mouth 2 (two) times daily., Disp: 60 tablet, Rfl: 0 .  clopidogrel (PLAVIX) 75 MG tablet, Take 1 tablet (75 mg total) by mouth daily., Disp: 30 tablet, Rfl: 0 .  cyanocobalamin (,  VITAMIN B-12,) 1000 MCG/ML injection, Inject 1,000 mcg into the muscle every 30 (thirty) days., Disp: , Rfl:  .  dexamethasone (DECADRON) 4 MG tablet, Take 2 tablets (8 mg total) by mouth daily. Start the day after chemotherapy for 2 days., Disp: 30 tablet, Rfl: 1 .  ergocalciferol (VITAMIN D2) 1.25 MG (50000 UT) capsule, Take 50,000 Units by mouth every Monday., Disp: , Rfl:  .  ezetimibe (ZETIA) 10 MG tablet, TAKE 1 TABLET(10 MG) BY MOUTH DAILY (Patient taking differently: Take 10 mg by mouth daily. ), Disp: 30 tablet, Rfl: 5 .  HYDROcodone-acetaminophen (NORCO/VICODIN) 5-325 MG tablet, Take 1 tablet by mouth at bedtime as needed for moderate pain. , Disp: , Rfl: 0 .  lidocaine-prilocaine (EMLA) cream, Apply to affected area once, Disp: 30 g, Rfl: 3 .  LORazepam (ATIVAN) 0.5 MG tablet, Take 1 tablet (0.5 mg total) by mouth every 6 (six) hours as needed (Nausea or vomiting)., Disp: 30 tablet, Rfl: 0 .  losartan (COZAAR) 50 MG tablet, Take 1 tablet (50 mg total) by mouth daily., Disp: 90 tablet, Rfl: 3 .  Multiple Vitamin (MULTIVITAMIN WITH MINERALS) TABS tablet, Take 1 tablet by mouth daily., Disp: 30 tablet, Rfl: 1 .  Multiple Vitamins-Minerals (OCUVITE PO), Take 1 capsule by mouth daily., Disp: , Rfl:  .  nitroGLYCERIN (NITROSTAT) 0.4 MG SL tablet, Place 1 tablet (0.4 mg total) under the tongue every 5 (five) minutes as needed for chest pain., Disp: 30 tablet, Rfl: 2 .  ondansetron (ZOFRAN) 4 MG tablet, Take 4 mg by mouth every 8 (eight) hours as needed for nausea or vomiting., Disp: , Rfl:  .  prochlorperazine (COMPAZINE) 10 MG tablet, Take 1 tablet (10 mg total) by mouth every 6 (six) hours as needed (Nausea or vomiting)., Disp: 30 tablet, Rfl: 1 .  protein supplement shake (PREMIER PROTEIN) LIQD, Take 325 mLs (11 oz  total) by mouth 2 (two) times daily between meals., Disp: 60 Can, Rfl: 0 .  senna-docusate (SENNA S) 8.6-50 MG tablet, Take 1 capsule by mouth daily. , Disp: , Rfl:  .  sertraline (ZOLOFT) 100 MG tablet, Take 100 mg by mouth daily. , Disp: , Rfl:  .  rosuvastatin (CRESTOR) 40 MG tablet, Take 1 tablet (40 mg total) by mouth daily., Disp: 30 tablet, Rfl: 3 No current facility-administered medications for this visit.   Facility-Administered Medications Ordered in Other Visits:  .  CARBOplatin (PARAPLATIN) 420 mg in sodium chloride 0.9 % 250 mL chemo infusion, 420 mg, Intravenous, Once, Sindy Guadeloupe, MD .  heparin lock flush 100 unit/mL, 500 Units, Intravenous, Once, Sindy Guadeloupe, MD .  PACLitaxel (TAXOL) 348 mg in sodium chloride 0.9 % 500 mL chemo infusion (> 29m/m2), 175 mg/m2 (Treatment Plan Recorded), Intravenous, Once, RSindy Guadeloupe MD, Last Rate: 186 mL/hr at 07/28/18 1140, 348 mg at 07/28/18 1140 .  pegfilgrastim (NEULASTA ONPRO KIT) injection 6 mg, 6 mg, Subcutaneous, Once, RRanda EvensC, MD .  sodium chloride flush (NS) 0.9 % injection 10 mL, 10 mL, Intravenous, PRN, RSindy Guadeloupe MD, 10 mL at 07/28/18 0901  Physical exam:  Vitals:   07/28/18 0925  BP: (!) 144/75  Pulse: 67  Temp: 97.6 F (36.4 C)  TempSrc: Oral  Weight: 180 lb 12.8 oz (82 kg)   Physical Exam Constitutional:      General: She is not in acute distress. HENT:     Head: Normocephalic and atraumatic.  Eyes:     Pupils: Pupils are equal, round,  and reactive to light.  Neck:     Musculoskeletal: Normal range of motion.  Cardiovascular:     Rate and Rhythm: Normal rate and regular rhythm.     Heart sounds: Normal heart sounds.  Pulmonary:     Effort: Pulmonary effort is normal.     Breath sounds: Normal breath sounds.  Abdominal:     General: Bowel sounds are normal.     Palpations: Abdomen is soft.  Skin:    General: Skin is warm and dry.  Neurological:     Mental Status: She is alert and  oriented to person, place, and time.      CMP Latest Ref Rng & Units 07/28/2018  Glucose 70 - 99 mg/dL 202(H)  BUN 8 - 23 mg/dL 20  Creatinine 0.44 - 1.00 mg/dL 0.84  Sodium 135 - 145 mmol/L 141  Potassium 3.5 - 5.1 mmol/L 3.4(L)  Chloride 98 - 111 mmol/L 112(H)  CO2 22 - 32 mmol/L 24  Calcium 8.9 - 10.3 mg/dL 8.7(L)  Total Protein 6.5 - 8.1 g/dL 6.5  Total Bilirubin 0.3 - 1.2 mg/dL 0.6  Alkaline Phos 38 - 126 U/L 101  AST 15 - 41 U/L 20  ALT 0 - 44 U/L 9   CBC Latest Ref Rng & Units 07/28/2018  WBC 4.0 - 10.5 K/uL 6.0  Hemoglobin 12.0 - 15.0 g/dL 12.6  Hematocrit 36.0 - 46.0 % 39.4  Platelets 150 - 400 K/uL 179    No images are attached to the encounter.  Ct Chest W Contrast  Result Date: 07/12/2018 CLINICAL DATA:  Ovarian malignancy restaging.  Lung nodule. EXAM: CT CHEST, ABDOMEN, AND PELVIS WITH CONTRAST TECHNIQUE: Multidetector CT imaging of the chest, abdomen and pelvis was performed following the standard protocol during bolus administration of intravenous contrast. CONTRAST:  117m OMNIPAQUE IOHEXOL 300 MG/ML  SOLN COMPARISON:  02/25/2018 FINDINGS: CT CHEST FINDINGS Cardiovascular: Coronary, aortic arch, and branch vessel atherosclerotic vascular disease. Mild ascending aortic ectasia at 3.6 cm. Mild cardiomegaly. Mediastinum/Nodes: Stable anterior mediastinal fluid density lesion with the upper component measuring 1.7 cm transverse, previously 1.8 cm transverse, probably a pericardial cyst. Contrast medium in the esophagus could reflect dysmotility. No pathologic adenopathy in the chest. Lungs/Pleura: Biapical pleuroparenchymal scarring. Sub solid 4 mm nodule in the right upper lobe on image 48/4, stable. 3 by 2 mm right middle lobe nodule on image 71/4, stable. Right lower lobe nodule measures 1.2 by 0.9 cm on image 99/4, formerly 0.8 by 0.6 cm by my measurement. Bandlike scarring or atelectasis along the left inferior pulmonary ligament. Musculoskeletal: Thoracic spondylosis. CT  ABDOMEN PELVIS FINDINGS Hepatobiliary: Cholecystectomy.  Otherwise unremarkable. Pancreas: Unremarkable Spleen: Unremarkable Adrenals/Urinary Tract: Unremarkable Stomach/Bowel: Gastric bypass. Vascular/Lymphatic: Aortoiliac atherosclerotic vascular disease. No pathologic adenopathy identified. Reproductive: Uterus absent. Interval salpingo oophorectomy with no residual lesion along the adnexa. Other: No ascites or definite nodularity along the omentum or mesentery to suggest peritoneal spread of disease at this time. Musculoskeletal: Left hip prosthesis and prior plate and screw acetabular repair. Evidence of remote right pubic ramus fractures. Grade 1 degenerative anterolisthesis at L4-5. IMPRESSION: 1. The right lower lobe pulmonary nodule along a bandlike density currently measures mildly larger than it did on 02/25/2018. This enlargement raises concern for malignancy. Proximity to the diaphragm could make percutaneous biopsy difficult. Consider PET-CT and/or referral to multidisciplinary cancer conference. 2. Interval bilateral salpingo oophorectomy, with no findings of malignancy in the abdomen/pelvis. 3. Stable fluid density lesion along the anterior mediastinum, most likely a pericardial cyst.  4. Other imaging findings of potential clinical significance: Aortic Atherosclerosis (ICD10-I70.0). Coronary atherosclerosis. Mild cardiomegaly. Two tiny stable nodules in the right lung. Electronically Signed   By: Van Clines M.D.   On: 07/12/2018 14:12   Ct Abdomen Pelvis W Contrast  Result Date: 07/12/2018 CLINICAL DATA:  Ovarian malignancy restaging.  Lung nodule. EXAM: CT CHEST, ABDOMEN, AND PELVIS WITH CONTRAST TECHNIQUE: Multidetector CT imaging of the chest, abdomen and pelvis was performed following the standard protocol during bolus administration of intravenous contrast. CONTRAST:  125m OMNIPAQUE IOHEXOL 300 MG/ML  SOLN COMPARISON:  02/25/2018 FINDINGS: CT CHEST FINDINGS Cardiovascular: Coronary,  aortic arch, and branch vessel atherosclerotic vascular disease. Mild ascending aortic ectasia at 3.6 cm. Mild cardiomegaly. Mediastinum/Nodes: Stable anterior mediastinal fluid density lesion with the upper component measuring 1.7 cm transverse, previously 1.8 cm transverse, probably a pericardial cyst. Contrast medium in the esophagus could reflect dysmotility. No pathologic adenopathy in the chest. Lungs/Pleura: Biapical pleuroparenchymal scarring. Sub solid 4 mm nodule in the right upper lobe on image 48/4, stable. 3 by 2 mm right middle lobe nodule on image 71/4, stable. Right lower lobe nodule measures 1.2 by 0.9 cm on image 99/4, formerly 0.8 by 0.6 cm by my measurement. Bandlike scarring or atelectasis along the left inferior pulmonary ligament. Musculoskeletal: Thoracic spondylosis. CT ABDOMEN PELVIS FINDINGS Hepatobiliary: Cholecystectomy.  Otherwise unremarkable. Pancreas: Unremarkable Spleen: Unremarkable Adrenals/Urinary Tract: Unremarkable Stomach/Bowel: Gastric bypass. Vascular/Lymphatic: Aortoiliac atherosclerotic vascular disease. No pathologic adenopathy identified. Reproductive: Uterus absent. Interval salpingo oophorectomy with no residual lesion along the adnexa. Other: No ascites or definite nodularity along the omentum or mesentery to suggest peritoneal spread of disease at this time. Musculoskeletal: Left hip prosthesis and prior plate and screw acetabular repair. Evidence of remote right pubic ramus fractures. Grade 1 degenerative anterolisthesis at L4-5. IMPRESSION: 1. The right lower lobe pulmonary nodule along a bandlike density currently measures mildly larger than it did on 02/25/2018. This enlargement raises concern for malignancy. Proximity to the diaphragm could make percutaneous biopsy difficult. Consider PET-CT and/or referral to multidisciplinary cancer conference. 2. Interval bilateral salpingo oophorectomy, with no findings of malignancy in the abdomen/pelvis. 3. Stable fluid  density lesion along the anterior mediastinum, most likely a pericardial cyst. 4. Other imaging findings of potential clinical significance: Aortic Atherosclerosis (ICD10-I70.0). Coronary atherosclerosis. Mild cardiomegaly. Two tiny stable nodules in the right lung. Electronically Signed   By: WVan ClinesM.D.   On: 07/12/2018 14:12   Nm Pet Image Initial (pi) Skull Base To Thigh  Result Date: 07/19/2018 CLINICAL DATA:  Initial treatment strategy for ovarian cancer. Enlarging right lower lobe pulmonary nodule. EXAM: NUCLEAR MEDICINE PET SKULL BASE TO THIGH TECHNIQUE: 9.75 mCi F-18 FDG was injected intravenously. Full-ring PET imaging was performed from the skull base to thigh after the radiotracer. CT data was obtained and used for attenuation correction and anatomic localization. Fasting blood glucose: 109 mg/dl COMPARISON:  CT scan 07/12/2018 and 02/27/2018 FINDINGS: Mediastinal blood pool activity: SUV max 2.69 NECK: No hypermetabolic lymph nodes in the neck. Incidental CT findings: Remote right occipital infarct is noted. CHEST: The right lower lobe pulmonary nodule is not hypermetabolic. SUV max is 1.7 which is below mediastinal background activity. No enlarged or hypermetabolic mediastinal or hilar lymph nodes. The anterior mediastinal nodule is stable and shows no hypermetabolism. No breast masses, supraclavicular or axillary adenopathy. Incidental CT findings: Stable extensive three-vessel and aortic atherosclerotic calcifications. ABDOMEN/PELVIS: No abnormal hypermetabolic activity within the liver, pancreas, adrenal glands, or spleen. No hypermetabolic lymph nodes in  the abdomen or pelvis. Surgical changes noted in the left lower inguinal region with mild associated mild hypermetabolism. Incidental CT findings: Advanced atherosclerotic calcifications involving the aorta and iliac arteries. Surgical changes from gastric bypass surgery. No complicating features. Status post cholecystectomy.  No  biliary dilatation. SKELETON: No significant findings. A left total hip arthroplasty is noted. Incidental CT findings: none IMPRESSION: 1. The right lower lobe pulmonary nodule is not hypermetabolic and likely benign. Recommend continued routine surveillance. 2. Anterior mediastinal nodule is not hypermetabolic and also likely benign. 3. No significant findings in the abdomen/pelvis. No omental or peritoneal surface lesions or adenopathy. Electronically Signed   By: Marijo Sanes M.D.   On: 07/19/2018 13:53     Assessment and plan- Patient is a 78 y.o. 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  She is here for on treatment assessment prior to cycle 1 of carbotaxol chemotherapy  Counts okay to proceed with cycle 1 of carbotaxol chemotherapy today with ongoing Neulasta support.  See me back in 10 days time to see how she tolerated her chemo.  Plan is to get her to complete 6 cycles if possible.  Treatment will be given with curative intent.  Chronic back pain: I have asked the patient to discuss pain management with her primary care doctor and orthopedics as I would not be able to prescribe her narcotics for nonmalignant pain   Visit Diagnosis No diagnosis found.   Dr. Randa Evens, MD, MPH [facility] [phone] 07/28/2018 12:18 PM

## 2018-07-29 ENCOUNTER — Telehealth: Payer: Self-pay | Admitting: Genetic Counselor

## 2018-07-29 ENCOUNTER — Other Ambulatory Visit: Payer: Self-pay | Admitting: *Deleted

## 2018-07-29 DIAGNOSIS — D509 Iron deficiency anemia, unspecified: Secondary | ICD-10-CM | POA: Diagnosis not present

## 2018-07-29 DIAGNOSIS — C569 Malignant neoplasm of unspecified ovary: Secondary | ICD-10-CM

## 2018-07-29 DIAGNOSIS — E559 Vitamin D deficiency, unspecified: Secondary | ICD-10-CM | POA: Diagnosis not present

## 2018-07-29 DIAGNOSIS — E119 Type 2 diabetes mellitus without complications: Secondary | ICD-10-CM | POA: Diagnosis not present

## 2018-07-29 LAB — CA 125: CANCER ANTIGEN (CA) 125: 9.4 U/mL (ref 0.0–38.1)

## 2018-07-29 NOTE — Telephone Encounter (Signed)
Cancer Genetics             Telegenetics Results Disclosure   Patient Name: Margaret [name] Patient DOB: 09-20-1940 Patient Age: 78 y.o. Phone Call Date: 07/29/2018  Referring Provider: [name], NP    Ms. Staiger was called today to discuss genetic test results. Please see the Genetics telephone note from 07/14/2018 for a detailed discussion of her personal and family histories and the recommendations provided.  Genetic Testing: At the time of Ms. Mctighe' telegenetics visit, she decided to pursue genetic testing of multiple genes associated with hereditary susceptibility to cancer. Testing included sequencing and deletion/duplication analysis. Testing did not reveal a pathogenic mutation in any of the genes analyzed.  A copy of the genetic test report will be scanned into Epic under the Media tab.  Interpretation: These results suggest that Ms. Casebier' cancer was most likely not due to an inherited predisposition. Most cancers happen by chance and this test, along with details of her family history, suggests that her cancer falls into this category.   Since the current test is not perfect, it is possible that there may be a gene mutation that current testing cannot detect, but that chance is small. It is possible that a different genetic factor, which has not yet been discovered or is not on this panel, is responsible for the cancer diagnoses in the family. Again, the likelihood of this is low.   Genes Analyzed: The genes analyzed were the 84 genes on [facility]'s Multi-Cancer panel (AIP, ALK, APC, ATM, AXIN2, BAP1, BARD1, BLM, BMPR1A, BRCA1, BRCA2, BRIP1, CASR, CDC73, CDH1, CDK4, CDKN1B, CDKN1C, CDKN2A, CEBPA, CHEK2, CTNNA1, DICER1, DIS3L2, EGFR, EPCAM, FH, FLCN, GATA2, GPC3, GREM1, HOXB13, HRAS, KIT, MAX, MEN1, MET, MITF, MLH1, MSH2, MSH3, MSH6, MUTYH, NBN, NF1, NF2, NTHL1, PALB2, PDGFRA, PHOX2B, PMS2, POLD1, POLE, POT1, PRKAR1A, PTCH1, PTEN, RAD50, RAD51C, RAD51D, RB1,  RECQL4, RET, RUNX1, SDHA, SDHAF2, SDHB, SDHC, SDHD, SMAD4, SMARCA4, SMARCB1, SMARCE1, STK11, SUFU, TERC, TERT, TMEM127, TP53, TSC1, TSC2, VHL, WRN, WT1).  Cancer Screening: Ms. Intriago is recommended to follow the cancer screening guidelines provided by her physicians.   Family Members: Family members are at some increased risk of developing cancer, over the general population risk, simply due to the family history. They are recommended to speak with their own providers about appropriate cancer screenings.   Any relative who had cancer at a young age or had a particularly rare cancer may also wish to pursue genetic testing. Genetic counselors can be located in other cities, by visiting the website of the [employer] ([employer]se) and Field seismologist for a Dietitian by zip code.   Follow-Up: Cancer genetics is a rapidly advancing field and it is possible that new genetic tests will be appropriate for Ms. Leiphart in the future. Ms. Amy is encouraged to remain in contact with Genetics on an annual basis so we can update her personal and family histories, and let her know of advances in cancer genetics that may benefit the family. Ms. Moline' questions were answered to her satisfaction today, and she knows she is welcome to call anytime with additional questions.    Steele Berg, MS, [place] Certified Genetic Counselor phone: [phone]

## 2018-07-30 ENCOUNTER — Other Ambulatory Visit: Payer: Self-pay

## 2018-07-30 ENCOUNTER — Observation Stay
Admission: EM | Admit: 2018-07-30 | Discharge: 2018-08-01 | Disposition: A | Discharge status: Home / Self Care | Payer: PPO | Attending: Specialist | Admitting: Specialist

## 2018-07-30 DIAGNOSIS — Z8673 Personal history of transient ischemic attack (TIA), and cerebral infarction without residual deficits: Secondary | ICD-10-CM | POA: Diagnosis not present

## 2018-07-30 DIAGNOSIS — Z833 Family history of diabetes mellitus: Secondary | ICD-10-CM | POA: Insufficient documentation

## 2018-07-30 DIAGNOSIS — R778 Other specified abnormalities of plasma proteins: Secondary | ICD-10-CM

## 2018-07-30 DIAGNOSIS — K219 Gastro-esophageal reflux disease without esophagitis: Secondary | ICD-10-CM | POA: Diagnosis not present

## 2018-07-30 DIAGNOSIS — E119 Type 2 diabetes mellitus without complications: Secondary | ICD-10-CM | POA: Insufficient documentation

## 2018-07-30 DIAGNOSIS — I2510 Atherosclerotic heart disease of native coronary artery without angina pectoris: Secondary | ICD-10-CM | POA: Insufficient documentation

## 2018-07-30 DIAGNOSIS — Z8249 Family history of ischemic heart disease and other diseases of the circulatory system: Secondary | ICD-10-CM | POA: Diagnosis not present

## 2018-07-30 DIAGNOSIS — Z87891 Personal history of nicotine dependence: Secondary | ICD-10-CM | POA: Diagnosis not present

## 2018-07-30 DIAGNOSIS — Z888 Allergy status to other drugs, medicaments and biological substances status: Secondary | ICD-10-CM | POA: Insufficient documentation

## 2018-07-30 DIAGNOSIS — R740 Nonspecific elevation of levels of transaminase and lactic acid dehydrogenase [LDH]: Secondary | ICD-10-CM | POA: Diagnosis not present

## 2018-07-30 DIAGNOSIS — I252 Old myocardial infarction: Secondary | ICD-10-CM | POA: Diagnosis not present

## 2018-07-30 DIAGNOSIS — R7989 Other specified abnormal findings of blood chemistry: Secondary | ICD-10-CM | POA: Diagnosis not present

## 2018-07-30 DIAGNOSIS — I110 Hypertensive heart disease with heart failure: Secondary | ICD-10-CM | POA: Insufficient documentation

## 2018-07-30 DIAGNOSIS — Z7902 Long term (current) use of antithrombotics/antiplatelets: Secondary | ICD-10-CM | POA: Insufficient documentation

## 2018-07-30 DIAGNOSIS — E785 Hyperlipidemia, unspecified: Secondary | ICD-10-CM | POA: Insufficient documentation

## 2018-07-30 DIAGNOSIS — I160 Hypertensive urgency: Principal | ICD-10-CM | POA: Insufficient documentation

## 2018-07-30 DIAGNOSIS — R748 Abnormal levels of other serum enzymes: Secondary | ICD-10-CM | POA: Diagnosis not present

## 2018-07-30 DIAGNOSIS — D72829 Elevated white blood cell count, unspecified: Secondary | ICD-10-CM | POA: Diagnosis not present

## 2018-07-30 DIAGNOSIS — I10 Essential (primary) hypertension: Secondary | ICD-10-CM | POA: Diagnosis not present

## 2018-07-30 DIAGNOSIS — Z955 Presence of coronary angioplasty implant and graft: Secondary | ICD-10-CM | POA: Insufficient documentation

## 2018-07-30 DIAGNOSIS — F329 Major depressive disorder, single episode, unspecified: Secondary | ICD-10-CM | POA: Insufficient documentation

## 2018-07-30 DIAGNOSIS — Z96642 Presence of left artificial hip joint: Secondary | ICD-10-CM | POA: Diagnosis not present

## 2018-07-30 DIAGNOSIS — Z7901 Long term (current) use of anticoagulants: Secondary | ICD-10-CM | POA: Insufficient documentation

## 2018-07-30 DIAGNOSIS — C569 Malignant neoplasm of unspecified ovary: Secondary | ICD-10-CM | POA: Diagnosis not present

## 2018-07-30 DIAGNOSIS — M79604 Pain in right leg: Secondary | ICD-10-CM | POA: Insufficient documentation

## 2018-07-30 DIAGNOSIS — F419 Anxiety disorder, unspecified: Secondary | ICD-10-CM | POA: Insufficient documentation

## 2018-07-30 DIAGNOSIS — Z79899 Other long term (current) drug therapy: Secondary | ICD-10-CM | POA: Insufficient documentation

## 2018-07-30 DIAGNOSIS — I509 Heart failure, unspecified: Secondary | ICD-10-CM | POA: Insufficient documentation

## 2018-07-30 DIAGNOSIS — Z9884 Bariatric surgery status: Secondary | ICD-10-CM | POA: Diagnosis not present

## 2018-07-30 DIAGNOSIS — I480 Paroxysmal atrial fibrillation: Secondary | ICD-10-CM | POA: Diagnosis not present

## 2018-07-30 DIAGNOSIS — Z881 Allergy status to other antibiotic agents status: Secondary | ICD-10-CM | POA: Insufficient documentation

## 2018-07-30 LAB — COMPREHENSIVE METABOLIC PANEL
ALK PHOS: 113 U/L (ref 38–126)
ALT: 11 U/L (ref 0–44)
AST: 31 U/L (ref 15–41)
Albumin: 4.4 g/dL (ref 3.5–5.0)
Anion gap: 10 (ref 5–15)
BUN: 20 mg/dL (ref 8–23)
CO2: 23 mmol/L (ref 22–32)
Calcium: 9.2 mg/dL (ref 8.9–10.3)
Chloride: 108 mmol/L (ref 98–111)
Creatinine, Ser: 0.85 mg/dL (ref 0.44–1.00)
GFR calc Af Amer: >60 mL/min (ref >60)
GFR calc non Af Amer: >60 mL/min (ref >60)
Glucose, Bld: 136 mg/dL — ABNORMAL HIGH (ref 70–99)
Potassium: 3.6 mmol/L (ref 3.5–5.1)
SODIUM: 141 mmol/L (ref 135–145)
Total Bilirubin: 1.6 mg/dL — ABNORMAL HIGH (ref 0.3–1.2)
Total Protein: 7.3 g/dL (ref 6.5–8.1)

## 2018-07-30 LAB — LIPASE, BLOOD: Lipase: 144 U/L — ABNORMAL HIGH (ref 11–51)

## 2018-07-30 LAB — LACTIC ACID, PLASMA: Lactic Acid, Venous: 2.4 mmol/L (ref 0.5–1.9)

## 2018-07-30 LAB — TROPONIN I: Troponin I: 0.2 ng/mL (ref <0.03)

## 2018-07-30 MED ORDER — HYDROCODONE-ACETAMINOPHEN 5-325 MG PO TABS
1.0000 | ORAL_TABLET | Freq: Once | ORAL | Status: AC
  Administered 2018-07-30: 1 via ORAL
  Filled 2018-07-30: qty 1

## 2018-07-30 MED ORDER — SODIUM CHLORIDE 0.9 % IV BOLUS
1000.0000 mL | Freq: Once | INTRAVENOUS | Status: AC
  Administered 2018-07-30: 1000 mL via INTRAVENOUS

## 2018-07-30 MED ORDER — AMLODIPINE BESYLATE 5 MG PO TABS
5.0000 mg | ORAL_TABLET | Freq: Once | ORAL | Status: AC
  Administered 2018-07-31: 5 mg via ORAL
  Filled 2018-07-30: qty 1

## 2018-07-30 NOTE — ED Provider Notes (Addendum)
[facility] Emergency Department Provider Note  ____________________________________________   First MD Initiated Contact with Patient 07/30/18 2301     (approximate)  I have reviewed the triage vital signs and the nursing notes.   HISTORY  Chief Complaint Dehydration    HPI Margaret [name] is a 78 y.o. female with medical history as listed below which notably includes ACS status post stent on Eliquis and Plavix, hypertension, and a relatively new diagnosis of cervical cancer for which she started chemotherapy 2 days ago and is cared for by Dr. Janese Banks.  She presents with her adult daughter who is a nurse at this facility for evaluation of bilateral lower leg pain, flushing all over, general malaise, and concern for dehydration.  The symptoms are moderate although the pain in the legs was severe earlier.  Nothing in particular made it better or worse.  She has not had any nausea, vomiting, chest pain, shortness of breath, nor abdominal pain after the chemotherapy.  She was having pain in both of her legs that felt like a aching and cramping pain that felt better when her daughter was rubbing her legs and started getting worse again when she stopped.  The pain is little bit better now but it is still at least moderate.  She denies fever/chills in addition to the other negatives described above.  She admits that she has not been drinking as much fluid as she was told since having the chemotherapy; she was told to try to stay very hydrated but believes she may not of had is much to drink as she should, and she said that she was admitted in the past for dehydration.  She is still urinating.  She has had no diarrhea.  Past Medical History:  Diagnosis Date  . Acetabular fracture ([place])   . Anxiety and depression   . Bartholin cyst   . CAD (coronary artery disease)   . Cancer ([place])    mouth cyst was cancer many years ago   . Cellulitis of right leg 2005  . CHF (congestive  heart failure) ([place])   . Colon polyp   . Complication of anesthesia   . Diabetes mellitus without complication ([place])   . DJD (degenerative joint disease)   . Dyspnea on exertion   . Dysrhythmia    PAF  . GERD (gastroesophageal reflux disease)   . Hyperlipidemia   . Hypertension   . Morbid obesity ([place])   . Myocardial infarction ([place]) 06/14/2017  . PAF (paroxysmal atrial fibrillation) ([place])    a. diagnosed 7/19; b. CHADS2VASc 8; c. Eliquis  . PONV (postoperative nausea and vomiting)   . Presence of stent in right coronary artery 02/08/12   2.5x18 Xience distal RCA  . Right shoulder pain   . Stroke ([place]) 2017  . Vaginal prolapse     Patient Active Problem List   Diagnosis Date Noted  . Accelerated hypertension 07/31/2018  . Elevated lactic acid level 07/31/2018  . Goals of care, counseling/discussion 07/22/2018  . Primary high grade serous adenocarcinoma of ovary ([place]) 07/13/2018  . Bradycardia 01/13/2018  . NSTEMI (non-ST elevated myocardial infarction) ([place]) 06/14/2017  . History of ischemic right PCA stroke 03/27/2016  . Hemianopia, homonymous, left 01/31/2016  . Arthritis of knee, degenerative 05/04/2013  . Diastolic dysfunction 27/08/5007  . CAD S/P percutaneous coronary angioplasty   . Hypertension   . Hyperlipidemia   . Morbid obesity ([place])   . Right shoulder pain   . Anxiety and depression   .  Dyspnea on exertion   . Cellulitis of right leg     Past Surgical History:  Procedure Laterality Date  . ABDOMINAL HYSTERECTOMY     supracervical abdominal w/removal tubes &/or ovaries  . BREAST BIOPSY  1989  . CHOLECYSTECTOMY    . COLONOSCOPY    . COLONOSCOPY WITH PROPOFOL N/A 06/21/2018   Procedure: COLONOSCOPY WITH PROPOFOL;  Surgeon: Lollie Sails, MD;  Location: [facility] ENDOSCOPY;  Service: Endoscopy;  Laterality: N/A;  . CORONARY STENT INTERVENTION N/A 06/14/2017   Procedure: CORONARY STENT INTERVENTION;  Surgeon: Wellington Hampshire, MD;  Location: [place] CV LAB;  Service: Cardiovascular;  Laterality: N/A;  . ESOPHAGOGASTRODUODENOSCOPY N/A 06/21/2018   Procedure: ESOPHAGOGASTRODUODENOSCOPY (EGD);  Surgeon: Lollie Sails, MD;  Location: [facility] ENDOSCOPY;  Service: Endoscopy;  Laterality: N/A;  . FACIAL COSMETIC SURGERY     face/neck lift  . FRACTURE SURGERY    . GASTRIC BYPASS  2008  . JOINT REPLACEMENT    . KNEE SURGERY     left knee arthroscopic  . LAPAROSCOPIC BILATERAL SALPINGO OOPHERECTOMY Bilateral 06/24/2018   Procedure: LAPAROSCOPIC BILATERAL SALPINGO OOPHORECTOMY;  Surgeon: Schermerhorn, Gwen Her, MD;  Location: [facility] ORS;  Service: Gynecology;  Laterality: Bilateral;  . LEFT HEART CATH AND CORONARY ANGIOGRAPHY N/A 06/14/2017   Procedure: LEFT HEART CATH AND CORONARY ANGIOGRAPHY;  Surgeon: Wellington Hampshire, MD;  Location: [place] CV LAB;  Service: Cardiovascular;  Laterality: N/A;  . LEFT HEART CATH AND CORONARY ANGIOGRAPHY N/A 06/14/2017   Procedure: LEFT HEART CATH AND CORONARY ANGIOGRAPHY;  Surgeon: Wellington Hampshire, MD;  Location: [place] CV LAB;  Service: Cardiovascular;  Laterality: N/A;  . LYSIS OF ADHESION  06/24/2018   Procedure: LYSIS OF ADHESION;  Surgeon: Ouida Sills Gwen Her, MD;  Location: [facility] ORS;  Service: Gynecology;;  . Crisoforo [place]   left hip fracture/repair and left arm fracture  . OTHER SURGICAL HISTORY  1986   hysterectomy  . PORTA CATH INSERTION N/A 07/26/2018   Procedure: PORTA CATH INSERTION;  Surgeon: Katha Cabal, MD;  Location: [place] CV LAB;  Service: Cardiovascular;  Laterality: N/A;  . right knee  late 90's   arthroscopic  . ROUX-EN-Y PROCEDURE    . SALIVARY GLAND SURGERY    . TOTAL HIP ARTHROPLASTY  2004   left    Prior to Admission medications   Medication Sig Start Date End Date Taking? Authorizing Provider  amLODipine (NORVASC) 2.5 MG tablet Take 1 tablet (2.5 mg total) by mouth daily. 04/08/18 04/03/19 Yes Martinique, Peter M, MD  apixaban (ELIQUIS) 5 MG TABS  tablet Take 1 tablet (5 mg total) by mouth 2 (two) times daily. 01/14/18  Yes Demetrios Loll, MD  clopidogrel (PLAVIX) 75 MG tablet Take 1 tablet (75 mg total) by mouth daily. 01/15/18  Yes Demetrios Loll, MD  cyanocobalamin (,VITAMIN B-12,) 1000 MCG/ML injection Inject 1,000 mcg into the muscle every 30 (thirty) days.   Yes [provider]  cyclobenzaprine (FLEXERIL) 10 MG tablet Take 10 mg by mouth 2 (two) times daily as needed for spasms or pain. 07/22/18  Yes [provider]  dexamethasone (DECADRON) 4 MG tablet Take 2 tablets (8 mg total) by mouth daily. Start the day after chemotherapy for 2 days. 07/22/18  Yes Sindy Guadeloupe, MD  ergocalciferol (VITAMIN D2) 1.25 MG (50000 UT) capsule Take 50,000 Units by mouth every Monday.   Yes [provider]  ezetimibe (ZETIA) 10 MG tablet TAKE 1 TABLET(10 MG) BY MOUTH DAILY Patient  taking differently: Take 10 mg by mouth daily.  03/09/18  Yes Kilroy, Doreene Burke, PA-C  HYDROcodone-acetaminophen (NORCO/VICODIN) 5-325 MG tablet Take 1 tablet by mouth at bedtime as needed for moderate pain.  01/28/18  Yes [provider]  lidocaine-prilocaine (EMLA) cream Apply to affected area once 07/22/18  Yes Sindy Guadeloupe, MD  LORazepam (ATIVAN) 0.5 MG tablet Take 1 tablet (0.5 mg total) by mouth every 6 (six) hours as needed (Nausea or vomiting). 07/22/18  Yes Sindy Guadeloupe, MD  losartan (COZAAR) 50 MG tablet Take 1 tablet (50 mg total) by mouth daily. 07/21/18 07/16/19 Yes Martinique, Peter M, MD  Multiple Vitamin (MULTIVITAMIN WITH MINERALS) TABS tablet Take 1 tablet by mouth daily. 01/14/18  Yes Demetrios Loll, MD  Multiple Vitamins-Minerals (OCUVITE PO) Take 1 capsule by mouth daily.   Yes [provider]  nitroGLYCERIN (NITROSTAT) 0.4 MG SL tablet Place 1 tablet (0.4 mg total) under the tongue every 5 (five) minutes as needed for chest pain. 06/16/17  Yes Demetrios Loll, MD  ondansetron (ZOFRAN) 4 MG tablet Take 4 mg by mouth every 8 (eight) hours as  needed for nausea or vomiting.   Yes [provider]  prochlorperazine (COMPAZINE) 10 MG tablet Take 1 tablet (10 mg total) by mouth every 6 (six) hours as needed (Nausea or vomiting). 07/22/18  Yes Sindy Guadeloupe, MD  protein supplement shake (PREMIER PROTEIN) LIQD Take 325 mLs (11 oz total) by mouth 2 (two) times daily between meals. 01/14/18  Yes Demetrios Loll, MD  rosuvastatin (CRESTOR) 40 MG tablet Take 40 mg by mouth at bedtime.   Yes [provider]  senna-docusate (SENNA S) 8.6-50 MG tablet Take 1 tablet by mouth daily.    Yes [provider]  sertraline (ZOLOFT) 100 MG tablet Take 100 mg by mouth daily.    Yes [provider]    Allergies Erythromycin base and Tape  Family History  Problem Relation Age of Onset  . Heart attack Father        MI age 32 and 36  . Heart attack Sister 55       cabg; deceased 35  . Diabetes Sister   . Hypertension Sister   . Heart attack Brother 89       cabg; deceased 69s  . Diabetes Brother   . Colon cancer Mother 74       deceased 58  . Diabetes Maternal Grandmother   . Cancer Maternal Grandfather        unknown type; deceased  . Hypertension Daughter   . Thyroid disease Daughter   . Breast cancer Neg Hx     Social History Social History   Tobacco Use  . Smoking status: Former Smoker    Packs/day: 2.00    Years: 26.00    Pack years: 52.00    Types: Cigarettes    Last attempt to quit: 06/22/1984    Years since quitting: 34.1  . Smokeless tobacco: Never Used  Substance Use Topics  . Alcohol use: Yes    Alcohol/week: 0.0 standard drinks    Frequency: Never    Comment: rare- less than once a month  . Drug use: No    Review of Systems Constitutional: No fever/chills.  General malaise. Eyes: No visual changes. ENT: No sore throat. Cardiovascular: Denies chest pain. Respiratory: Denies shortness of breath. Gastrointestinal: No abdominal pain.  No nausea, no vomiting.  No diarrhea.  No  constipation. Genitourinary: Negative for dysuria. Musculoskeletal: Bilateral lower extremity  aching pain as described above.  Negative for neck pain.  Negative for back pain. Integumentary: Flushing of the skin; her family feels that she looks quite red.  Negative for rash. Neurological: Negative for headaches, focal weakness or numbness.   ____________________________________________   PHYSICAL EXAM:  VITAL SIGNS: ED Triage Vitals  Enc Vitals Group     BP 07/30/18 2152 (!) 171/97     Pulse Rate 07/30/18 2152 (!) 113     Resp 07/30/18 2152 20     Temp 07/30/18 2152 98.8 F (37.1 C)     Temp Source 07/30/18 2152 Oral     SpO2 07/30/18 2152 100 %     Weight --      Height --      Head Circumference --      Peak Flow --      Pain Score 07/30/18 2149 8     Pain Loc --      Pain Edu? --      Excl. in [place]? --     Constitutional: Alert and oriented. Well appearing and in no acute distress. Eyes: Conjunctivae are normal.  Head: Atraumatic. Nose: No congestion/rhinnorhea. Mouth/Throat: Mucous membranes are moist. Neck: No stridor.  No meningeal signs.   Cardiovascular: Normal rate, regular rhythm. Good peripheral circulation. Grossly normal heart sounds.  Port is in place in the chest wall for her chemotherapy. Respiratory: Normal respiratory effort.  No retractions. Lungs CTAB. Gastrointestinal: Soft and nontender. No distention.  Musculoskeletal: No lower extremity tenderness nor edema. No gross deformities of extremities.  Her lower extremities are well perfused, warm but not cellulitic, no evidence of rash, no pitting edema, with easily palpable distal pulses. Neurologic:  Normal speech and language. No gross focal neurologic deficits are appreciated.  Skin:  Skin is warm, dry and intact. No rash noted. Psychiatric: Mood and affect are normal. Speech and behavior are normal.  ____________________________________________   LABS (all labs ordered are listed, but only  abnormal results are displayed)  Labs Reviewed  CBC WITH DIFFERENTIAL/PLATELET - Abnormal; Notable for the following components:      Result Value   WBC 25.8 (*)    RBC 5.23 (*)    Neutro Abs 22.4 (*)    Abs Immature Granulocytes 1.58 (*)    All other components within normal limits  COMPREHENSIVE METABOLIC PANEL - Abnormal; Notable for the following components:   Glucose, Bld 136 (*)    Total Bilirubin 1.6 (*)    All other components within normal limits  LACTIC ACID, PLASMA - Abnormal; Notable for the following components:   Lactic Acid, Venous 2.4 (*)    All other components within normal limits  TROPONIN I - Abnormal; Notable for the following components:   Troponin I 0.20 (*)    All other components within normal limits  LIPASE, BLOOD - Abnormal; Notable for the following components:   Lipase 144 (*)    All other components within normal limits  URINALYSIS, ROUTINE W REFLEX MICROSCOPIC - Abnormal; Notable for the following components:   Color, Urine YELLOW (*)    APPearance CLEAR (*)    Ketones, ur 5 (*)    All other components within normal limits  LACTIC ACID, PLASMA  MAGNESIUM   ____________________________________________  EKG  ED ECG REPORT I, Hinda Kehr, the attending physician, personally viewed and interpreted this ECG.  Date: 07/30/2018 EKG Time: 23: 30 Rate: 86 Rhythm: normal sinus rhythm QRS Axis: normal Intervals: LVH with secondary repolarization abnormality ST/T Wave  abnormalities: Non-specific ST segment / T-wave changes, but no clear evidence of acute ischemia. Narrative Interpretation: no definitive evidence of acute ischemia; does not meet STEMI criteria.   ____________________________________________  RADIOLOGY   ED MD interpretation: No indication for emergent imaging  Official radiology report(s): No results found.  ____________________________________________   PROCEDURES  Critical Care performed: No   Procedure(s)  performed:   Procedures   ____________________________________________   INITIAL IMPRESSION / ASSESSMENT AND PLAN / ED COURSE  As part of my medical decision making, I reviewed the following data within the [place] History obtained from family, Nursing notes reviewed and incorporated, Labs reviewed , EKG interpreted , Old chart reviewed, Discussed with admitting physician  and Notes from prior ED visits    Differential diagnosis includes, but is not limited to, dehydration/volume depletion, electrolyte abnormality, chemotherapy side effect, acute infectious process, allergic reaction, less likely DVT.  The patient is generally well-appearing and in no distress.  She does not appear volume depleted.  She has no abdominal tenderness to palpation.  Her vital signs are notable for severe hypertension but she does not seem to be in distress from it.  She has no signs or symptoms of an emergent condition in her legs to account for the pain she is experiencing and it is most likely a side effect of the chemotherapy.  I gave her 1 Norco by mouth because she is taken this in the past with some success.  However her labs are somewhat concerning.  She has a lactic of greater than 2 which I suspect is the result of volume depletion and does not represent infection.  She has not yet provided a urine specimen but we will check to make sure that she does not have a UTI.  Her leukocytosis of nearly 26 is almost certainly the result of the Neulasta that she received 2 days ago.  She is having no respiratory symptoms and there is no indication for chest x-ray.  Her troponin is elevated at 0.2 which may be demand ischemia in the setting of her medications but she also has had an MI in the past.  Her lipase of 144 again is abnormal but not necessarily indicative of acute pancreatitis given that she is having no abdominal pain, nausea, nor vomiting, and again is most likely a medication side  effect.  Given the constellation of abnormalities, however, each of which could represent an emergent medical condition requiring additional treatment, I will discuss the case with the hospitalist to bring her in at least for observation to cycle enzymes including her lipase and troponin, provide fluid hydration, and control her blood pressure.  I am starting with amlodipine 5 mg by mouth but she may require IV blood pressure medicine but at this time I wish to avoid dropping her blood pressure too rapidly and potentially leading to a CVA.  The patient and her daughter understand and agree with the current plan.  Clinical Course as of Jul 31 214  Sun Jul 31, 2018  0001 Spoke by phone with Dr. Jannifer Franklin with the hospitalist service who will admit for observation, trending enzymes, etc.   [CF]  0216 Lactic Acid, Venous: 1.4 [CF]    Clinical Course User Index [CF] Hinda Kehr, MD    ____________________________________________  FINAL CLINICAL IMPRESSION(S) / ED DIAGNOSES  Final diagnoses:  Uncontrolled hypertension  Elevated lactic acid level  Elevated lipase  Elevated troponin I level  Leukocytosis, unspecified type     MEDICATIONS GIVEN  DURING THIS VISIT:  Medications  sodium chloride 0.9 % bolus 1,000 mL (0 mLs Intravenous Stopped 07/31/18 0044)  HYDROcodone-acetaminophen (NORCO/VICODIN) 5-325 MG per tablet 1 tablet (1 tablet Oral Given 07/30/18 2341)  amLODipine (NORVASC) tablet 5 mg (5 mg Oral Given 07/31/18 0009)     ED Discharge Orders    None       Note:  This document was prepared using Dragon voice recognition software and may include unintentional dictation errors.   Hinda Kehr, MD 07/31/18 [place] Ouch    Hinda Kehr, MD 07/31/18 [phone]

## 2018-07-30 NOTE — ED Notes (Addendum)
CRITICAL LAB: LACTIC is 2.4, [facility], Dr. Karma Greaser notified, orders received

## 2018-07-30 NOTE — ED Triage Notes (Signed)
Patient reports having bilateral leg pain and thinks she might be dehydrated again.

## 2018-07-30 NOTE — ED Notes (Addendum)
ED Provider at bedside.  Pt reports hx of gastric bypass

## 2018-07-30 NOTE — ED Notes (Addendum)
CRITICAL LAB: TROPONIN is 0.20, [facility], Dr. Karma Greaser notified, orders received

## 2018-07-31 ENCOUNTER — Other Ambulatory Visit: Payer: Self-pay

## 2018-07-31 DIAGNOSIS — I10 Essential (primary) hypertension: Secondary | ICD-10-CM | POA: Insufficient documentation

## 2018-07-31 DIAGNOSIS — R7989 Other specified abnormal findings of blood chemistry: Secondary | ICD-10-CM | POA: Insufficient documentation

## 2018-07-31 DIAGNOSIS — I2510 Atherosclerotic heart disease of native coronary artery without angina pectoris: Secondary | ICD-10-CM | POA: Diagnosis not present

## 2018-07-31 DIAGNOSIS — I480 Paroxysmal atrial fibrillation: Secondary | ICD-10-CM | POA: Diagnosis not present

## 2018-07-31 DIAGNOSIS — I160 Hypertensive urgency: Secondary | ICD-10-CM | POA: Diagnosis present

## 2018-07-31 LAB — URINALYSIS, ROUTINE W REFLEX MICROSCOPIC
Bilirubin Urine: NEGATIVE
Glucose, UA: NEGATIVE mg/dL
HGB URINE DIPSTICK: NEGATIVE
Ketones, ur: 5 mg/dL — AB
Leukocytes, UA: NEGATIVE
Nitrite: NEGATIVE
Protein, ur: NEGATIVE mg/dL
SPECIFIC GRAVITY, URINE: 1.015 (ref 1.005–1.030)
pH: 6 (ref 5.0–8.0)

## 2018-07-31 LAB — CBC WITH DIFFERENTIAL/PLATELET
Abs Immature Granulocytes: 1.58 10*3/uL — ABNORMAL HIGH (ref 0.00–0.07)
Basophils Absolute: 0.1 10*3/uL (ref 0.0–0.1)
Basophils Relative: 0 %
Eosinophils Absolute: 0 10*3/uL (ref 0.0–0.5)
Eosinophils Relative: 0 %
HCT: 43.7 % (ref 36.0–46.0)
Hemoglobin: 14.4 g/dL (ref 12.0–15.0)
Immature Granulocytes: 6 %
Lymphocytes Relative: 6 %
Lymphs Abs: 1.6 10*3/uL (ref 0.7–4.0)
MCH: 27.5 pg (ref 26.0–34.0)
MCHC: 33 g/dL (ref 30.0–36.0)
MCV: 83.6 fL (ref 80.0–100.0)
Monocytes Absolute: 0.1 10*3/uL (ref 0.1–1.0)
Monocytes Relative: 0 %
Neutro Abs: 22.4 10*3/uL — ABNORMAL HIGH (ref 1.7–7.7)
Neutrophils Relative %: 88 %
Platelets: 173 10*3/uL (ref 150–400)
RBC: 5.23 MIL/uL — AB (ref 3.87–5.11)
RDW: 15.2 % (ref 11.5–15.5)
WBC: 25.8 10*3/uL — ABNORMAL HIGH (ref 4.0–10.5)
nRBC: 0 % (ref 0.0–0.2)

## 2018-07-31 LAB — TROPONIN I
Troponin I: 0.2 ng/mL (ref <0.03)
Troponin I: 0.17 ng/mL (ref <0.03)
Troponin I: 0.12 ng/mL (ref <0.03)

## 2018-07-31 LAB — LACTIC ACID, PLASMA: Lactic Acid, Venous: 1.4 mmol/L (ref 0.5–1.9)

## 2018-07-31 LAB — TSH: TSH: 4.668 u[IU]/mL — ABNORMAL HIGH (ref 0.350–4.500)

## 2018-07-31 LAB — MAGNESIUM: Magnesium: 2.2 mg/dL (ref 1.7–2.4)

## 2018-07-31 MED ORDER — EZETIMIBE 10 MG PO TABS
10.0000 mg | ORAL_TABLET | Freq: Every day | ORAL | Status: DC
  Administered 2018-07-31 – 2018-08-01 (×2): 10 mg via ORAL
  Filled 2018-07-31 (×2): qty 1

## 2018-07-31 MED ORDER — LOSARTAN POTASSIUM 50 MG PO TABS
50.0000 mg | ORAL_TABLET | Freq: Every day | ORAL | Status: DC
  Administered 2018-07-31: 50 mg via ORAL
  Filled 2018-07-31: qty 1

## 2018-07-31 MED ORDER — AMLODIPINE BESYLATE 5 MG PO TABS
2.5000 mg | ORAL_TABLET | Freq: Every day | ORAL | Status: DC
  Administered 2018-07-31 – 2018-08-01 (×2): 2.5 mg via ORAL
  Filled 2018-07-31 (×2): qty 1

## 2018-07-31 MED ORDER — CYANOCOBALAMIN 1000 MCG/ML IJ SOLN
1000.0000 ug | INTRAMUSCULAR | Status: DC
  Filled 2018-07-31: qty 1

## 2018-07-31 MED ORDER — HYDROCODONE-ACETAMINOPHEN 5-325 MG PO TABS
1.0000 | ORAL_TABLET | ORAL | Status: DC | PRN
  Administered 2018-07-31 (×2): 1 via ORAL
  Filled 2018-07-31 (×2): qty 1

## 2018-07-31 MED ORDER — ONDANSETRON HCL 4 MG/2ML IJ SOLN
4.0000 mg | Freq: Four times a day (QID) | INTRAMUSCULAR | Status: DC | PRN
  Administered 2018-07-31: 4 mg via INTRAVENOUS
  Filled 2018-07-31: qty 2

## 2018-07-31 MED ORDER — DEXAMETHASONE 4 MG PO TABS
8.0000 mg | ORAL_TABLET | Freq: Every day | ORAL | Status: DC
  Administered 2018-07-31 – 2018-08-01 (×2): 8 mg via ORAL
  Filled 2018-07-31 (×2): qty 2

## 2018-07-31 MED ORDER — NITROGLYCERIN 0.4 MG SL SUBL: 0.4000 mg | SUBLINGUAL_TABLET | SUBLINGUAL | Status: DC | PRN

## 2018-07-31 MED ORDER — LORAZEPAM 0.5 MG PO TABS: 0.5000 mg | ORAL_TABLET | Freq: Four times a day (QID) | ORAL | Status: DC | PRN

## 2018-07-31 MED ORDER — SENNOSIDES-DOCUSATE SODIUM 8.6-50 MG PO TABS
1.0000 | ORAL_TABLET | Freq: Every day | ORAL | Status: DC
  Administered 2018-07-31 – 2018-08-01 (×2): 1 via ORAL
  Filled 2018-07-31 (×2): qty 1

## 2018-07-31 MED ORDER — HYDRALAZINE HCL 20 MG/ML IJ SOLN
10.0000 mg | Freq: Four times a day (QID) | INTRAMUSCULAR | Status: DC | PRN
  Administered 2018-07-31 (×2): 10 mg via INTRAVENOUS
  Filled 2018-07-31 (×2): qty 1

## 2018-07-31 MED ORDER — CLOPIDOGREL BISULFATE 75 MG PO TABS
75.0000 mg | ORAL_TABLET | Freq: Every day | ORAL | Status: DC
  Administered 2018-07-31 – 2018-08-01 (×2): 75 mg via ORAL
  Filled 2018-07-31 (×2): qty 1

## 2018-07-31 MED ORDER — SERTRALINE HCL 50 MG PO TABS
100.0000 mg | ORAL_TABLET | Freq: Every day | ORAL | Status: DC
  Administered 2018-07-31 – 2018-08-01 (×2): 100 mg via ORAL
  Filled 2018-07-31 (×2): qty 2

## 2018-07-31 MED ORDER — DOCUSATE SODIUM 100 MG PO CAPS
100.0000 mg | ORAL_CAPSULE | Freq: Two times a day (BID) | ORAL | Status: DC
  Administered 2018-07-31 – 2018-08-01 (×2): 100 mg via ORAL
  Filled 2018-07-31 (×3): qty 1

## 2018-07-31 MED ORDER — LORATADINE 10 MG PO TABS
10.0000 mg | ORAL_TABLET | Freq: Every day | ORAL | Status: DC
  Administered 2018-07-31: 10 mg via ORAL
  Filled 2018-07-31: qty 1

## 2018-07-31 MED ORDER — ROSUVASTATIN CALCIUM 10 MG PO TABS
40.0000 mg | ORAL_TABLET | Freq: Every day | ORAL | Status: DC
  Administered 2018-07-31: 40 mg via ORAL
  Filled 2018-07-31: qty 8

## 2018-07-31 MED ORDER — LABETALOL HCL 5 MG/ML IV SOLN
10.0000 mg | Freq: Once | INTRAVENOUS | Status: AC
  Administered 2018-07-31: 10 mg via INTRAVENOUS
  Filled 2018-07-31: qty 4

## 2018-07-31 MED ORDER — HYDROCODONE-ACETAMINOPHEN 5-325 MG PO TABS
1.0000 | ORAL_TABLET | Freq: Every evening | ORAL | Status: DC | PRN
  Administered 2018-07-31: 1 via ORAL
  Filled 2018-07-31: qty 1

## 2018-07-31 MED ORDER — PROCHLORPERAZINE EDISYLATE 10 MG/2ML IJ SOLN: 10.0000 mg | INTRAMUSCULAR | Status: DC | PRN

## 2018-07-31 MED ORDER — HYDROCODONE-ACETAMINOPHEN 5-325 MG PO TABS: 1.0000 | ORAL_TABLET | Freq: Every evening | ORAL | 0 refills | Status: DC | PRN

## 2018-07-31 MED ORDER — ACETAMINOPHEN 325 MG PO TABS: 650.0000 mg | ORAL_TABLET | Freq: Four times a day (QID) | ORAL | Status: DC | PRN

## 2018-07-31 MED ORDER — PREMIER PROTEIN SHAKE
11.0000 [oz_av] | Freq: Two times a day (BID) | ORAL | Status: DC
  Administered 2018-07-31 – 2018-08-01 (×2): 11 [oz_av] via ORAL

## 2018-07-31 MED ORDER — ACETAMINOPHEN 650 MG RE SUPP: 650.0000 mg | Freq: Four times a day (QID) | RECTAL | Status: DC | PRN

## 2018-07-31 MED ORDER — SODIUM CHLORIDE 0.9 % IV SOLN
INTRAVENOUS | Status: DC
  Administered 2018-07-31: 04:00:00 via INTRAVENOUS

## 2018-07-31 MED ORDER — CYCLOBENZAPRINE HCL 10 MG PO TABS
10.0000 mg | ORAL_TABLET | Freq: Two times a day (BID) | ORAL | Status: DC | PRN
  Administered 2018-07-31: 10 mg via ORAL
  Filled 2018-07-31: qty 1

## 2018-07-31 MED ORDER — ADULT MULTIVITAMIN W/MINERALS CH
1.0000 | ORAL_TABLET | Freq: Every day | ORAL | Status: DC
  Administered 2018-07-31 – 2018-08-01 (×2): 1 via ORAL
  Filled 2018-07-31 (×2): qty 1

## 2018-07-31 MED ORDER — NITROGLYCERIN 2 % TD OINT: 0.5000 [in_us] | TOPICAL_OINTMENT | TRANSDERMAL | Status: AC

## 2018-07-31 MED ORDER — ONDANSETRON HCL 4 MG PO TABS: 4.0000 mg | ORAL_TABLET | Freq: Four times a day (QID) | ORAL | Status: DC | PRN

## 2018-07-31 MED ORDER — APIXABAN 5 MG PO TABS
5.0000 mg | ORAL_TABLET | Freq: Two times a day (BID) | ORAL | Status: DC
  Administered 2018-07-31 – 2018-08-01 (×3): 5 mg via ORAL
  Filled 2018-07-31 (×3): qty 1

## 2018-07-31 MED ORDER — VITAMIN D (ERGOCALCIFEROL) 1.25 MG (50000 UNIT) PO CAPS
50000.0000 [IU] | ORAL_CAPSULE | ORAL | Status: DC
  Administered 2018-08-01: 50000 [IU] via ORAL
  Filled 2018-07-31: qty 1

## 2018-07-31 NOTE — H&P (Signed)
Margaret [name] is an 78 y.o. female.   Chief Complaint: Lower extremity pain HPI: The patient with past medical history of ovarian cancer, CAD, atrial fibrillation, diabetes, hypertension and hyperlipidemia presents to the emergency department complaining of lower extremity pain.  The patient just started 1 cycle of carbotaxol therapy and admits to forgetting to take her dexamethasone.  She reported excruciating pain in her legs that felt as if her feet were swelling.  Notably she does not have any lower extremity edema.  Once in the emergency department she was found to have severely elevated blood pressure with systolic readings greater than 200.  He was given pain medication as well as amlodipine 5 mg which resulted in improved pressure.  However laboratory evaluation showed elevated troponin as well as lactic acid.  The patient was started on intravenous saline which again increased her blood pressure.  Due to uncontrolled hypertension as well as elevated troponin the emergency department staff call hospitalist service for admission.  Past Medical History:  Diagnosis Date  . Acetabular fracture ([place])   . Anxiety and depression   . Bartholin cyst   . CAD (coronary artery disease)   . Cancer ([place])    mouth cyst was cancer many years ago   . Cellulitis of right leg 2005  . CHF (congestive heart failure) ([place])   . Colon polyp   . Complication of anesthesia   . Diabetes mellitus without complication ([place])   . DJD (degenerative joint disease)   . Dyspnea on exertion   . Dysrhythmia    PAF  . GERD (gastroesophageal reflux disease)   . Hyperlipidemia   . Hypertension   . Morbid obesity ([place])   . Myocardial infarction ([place]) 06/14/2017  . PAF (paroxysmal atrial fibrillation) ([place])    a. diagnosed 7/19; b. CHADS2VASc 8; c. Eliquis  . PONV (postoperative nausea and vomiting)   . Presence of stent in right coronary artery 02/08/12   2.5x18 Xience distal RCA  . Right shoulder pain   . Stroke ([place])  2017  . Vaginal prolapse     Past Surgical History:  Procedure Laterality Date  . ABDOMINAL HYSTERECTOMY     supracervical abdominal w/removal tubes &/or ovaries  . BREAST BIOPSY  1989  . CHOLECYSTECTOMY    . COLONOSCOPY    . COLONOSCOPY WITH PROPOFOL N/A 06/21/2018   Procedure: COLONOSCOPY WITH PROPOFOL;  Surgeon: Lollie Sails, MD;  Location: [facility] ENDOSCOPY;  Service: Endoscopy;  Laterality: N/A;  . CORONARY STENT INTERVENTION N/A 06/14/2017   Procedure: CORONARY STENT INTERVENTION;  Surgeon: Wellington Hampshire, MD;  Location: [place] CV LAB;  Service: Cardiovascular;  Laterality: N/A;  . ESOPHAGOGASTRODUODENOSCOPY N/A 06/21/2018   Procedure: ESOPHAGOGASTRODUODENOSCOPY (EGD);  Surgeon: Lollie Sails, MD;  Location: [facility] ENDOSCOPY;  Service: Endoscopy;  Laterality: N/A;  . FACIAL COSMETIC SURGERY     face/neck lift  . FRACTURE SURGERY    . GASTRIC BYPASS  2008  . JOINT REPLACEMENT    . KNEE SURGERY     left knee arthroscopic  . LAPAROSCOPIC BILATERAL SALPINGO OOPHERECTOMY Bilateral 06/24/2018   Procedure: LAPAROSCOPIC BILATERAL SALPINGO OOPHORECTOMY;  Surgeon: Schermerhorn, Gwen Her, MD;  Location: [facility] ORS;  Service: Gynecology;  Laterality: Bilateral;  . LEFT HEART CATH AND CORONARY ANGIOGRAPHY N/A 06/14/2017   Procedure: LEFT HEART CATH AND CORONARY ANGIOGRAPHY;  Surgeon: Wellington Hampshire, MD;  Location: [place] CV LAB;  Service: Cardiovascular;  Laterality: N/A;  . LEFT HEART CATH AND CORONARY ANGIOGRAPHY N/A 06/14/2017  Procedure: LEFT HEART CATH AND CORONARY ANGIOGRAPHY;  Surgeon: Wellington Hampshire, MD;  Location: [place] CV LAB;  Service: Cardiovascular;  Laterality: N/A;  . LYSIS OF ADHESION  06/24/2018   Procedure: LYSIS OF ADHESION;  Surgeon: Ouida Sills Gwen Her, MD;  Location: [facility] ORS;  Service: Gynecology;;  . Crisoforo [place]   left hip fracture/repair and left arm fracture  . OTHER SURGICAL HISTORY  1986   hysterectomy  . PORTA CATH INSERTION  N/A 07/26/2018   Procedure: PORTA CATH INSERTION;  Surgeon: Katha Cabal, MD;  Location: [place] CV LAB;  Service: Cardiovascular;  Laterality: N/A;  . right knee  late 90's   arthroscopic  . ROUX-EN-Y PROCEDURE    . SALIVARY GLAND SURGERY    . TOTAL HIP ARTHROPLASTY  2004   left    Family History  Problem Relation Age of Onset  . Heart attack Father        MI age 49 and 102  . Heart attack Sister 69       cabg; deceased 59  . Diabetes Sister   . Hypertension Sister   . Heart attack Brother 35       cabg; deceased 24s  . Diabetes Brother   . Colon cancer Mother 76       deceased 27  . Diabetes Maternal Grandmother   . Cancer Maternal Grandfather        unknown type; deceased  . Hypertension Daughter   . Thyroid disease Daughter   . Breast cancer Neg Hx    Social History:  reports that she quit smoking about 34 years ago. Her smoking use included cigarettes. She has a 52.00 pack-year smoking history. She has never used smokeless tobacco. She reports current alcohol use. She reports that she does not use drugs.  Allergies:  Allergies  Allergen Reactions  . Erythromycin Base Nausea And Vomiting  . Tape Itching, Rash and Other (See Comments)    Also "band-aids"    Medications Prior to Admission  Medication Sig Dispense Refill  . amLODipine (NORVASC) 2.5 MG tablet Take 1 tablet (2.5 mg total) by mouth daily. 90 tablet 3  . apixaban (ELIQUIS) 5 MG TABS tablet Take 1 tablet (5 mg total) by mouth 2 (two) times daily. 60 tablet 0  . clopidogrel (PLAVIX) 75 MG tablet Take 1 tablet (75 mg total) by mouth daily. 30 tablet 0  . cyanocobalamin (,VITAMIN B-12,) 1000 MCG/ML injection Inject 1,000 mcg into the muscle every 30 (thirty) days.    . cyclobenzaprine (FLEXERIL) 10 MG tablet Take 10 mg by mouth 2 (two) times daily as needed for spasms or pain.    Marland Kitchen dexamethasone (DECADRON) 4 MG tablet Take 2 tablets (8 mg total) by mouth daily. Start the day after chemotherapy for 2  days. 30 tablet 1  . ergocalciferol (VITAMIN D2) 1.25 MG (50000 UT) capsule Take 50,000 Units by mouth every Monday.    . ezetimibe (ZETIA) 10 MG tablet TAKE 1 TABLET(10 MG) BY MOUTH DAILY (Patient taking differently: Take 10 mg by mouth daily. ) 30 tablet 5  . HYDROcodone-acetaminophen (NORCO/VICODIN) 5-325 MG tablet Take 1 tablet by mouth at bedtime as needed for moderate pain.   0  . lidocaine-prilocaine (EMLA) cream Apply to affected area once 30 g 3  . LORazepam (ATIVAN) 0.5 MG tablet Take 1 tablet (0.5 mg total) by mouth every 6 (six) hours as needed (Nausea or vomiting). 30 tablet 0  . losartan (COZAAR) 50 MG tablet Take  1 tablet (50 mg total) by mouth daily. 90 tablet 3  . Multiple Vitamin (MULTIVITAMIN WITH MINERALS) TABS tablet Take 1 tablet by mouth daily. 30 tablet 1  . Multiple Vitamins-Minerals (OCUVITE PO) Take 1 capsule by mouth daily.    . nitroGLYCERIN (NITROSTAT) 0.4 MG SL tablet Place 1 tablet (0.4 mg total) under the tongue every 5 (five) minutes as needed for chest pain. 30 tablet 2  . ondansetron (ZOFRAN) 4 MG tablet Take 4 mg by mouth every 8 (eight) hours as needed for nausea or vomiting.    . prochlorperazine (COMPAZINE) 10 MG tablet Take 1 tablet (10 mg total) by mouth every 6 (six) hours as needed (Nausea or vomiting). 30 tablet 1  . protein supplement shake (PREMIER PROTEIN) LIQD Take 325 mLs (11 oz total) by mouth 2 (two) times daily between meals. 60 Can 0  . rosuvastatin (CRESTOR) 40 MG tablet Take 40 mg by mouth at bedtime.    . senna-docusate (SENNA S) 8.6-50 MG tablet Take 1 tablet by mouth daily.     . sertraline (ZOLOFT) 100 MG tablet Take 100 mg by mouth daily.       Results for orders placed or performed during the hospital encounter of 07/30/18 (from the past 48 hour(s))  CBC with Differential/Platelet     Status: Abnormal   Collection Time: 07/30/18 10:43 PM  Result Value Ref Range   WBC 25.8 (H) 4.0 - 10.5 K/uL   RBC 5.23 (H) 3.87 - 5.11 MIL/uL    Hemoglobin 14.4 12.0 - 15.0 g/dL   HCT 43.7 36.0 - 46.0 %   MCV 83.6 80.0 - 100.0 fL   MCH 27.5 26.0 - 34.0 pg   MCHC 33.0 30.0 - 36.0 g/dL   RDW 15.2 11.5 - 15.5 %   Platelets 173 150 - 400 K/uL   nRBC 0.0 0.0 - 0.2 %   Neutrophils Relative % 88 %   Neutro Abs 22.4 (H) 1.7 - 7.7 K/uL   Lymphocytes Relative 6 %   Lymphs Abs 1.6 0.7 - 4.0 K/uL   Monocytes Relative 0 %   Monocytes Absolute 0.1 0.1 - 1.0 K/uL   Eosinophils Relative 0 %   Eosinophils Absolute 0.0 0.0 - 0.5 K/uL   Basophils Relative 0 %   Basophils Absolute 0.1 0.0 - 0.1 K/uL   WBC Morphology VACUOLATED NEUTROPHILS    Immature Granulocytes 6 %   Abs Immature Granulocytes 1.58 (H) 0.00 - 0.07 K/uL    Comment: Performed at [facility], [address] [place], [place] [address]  Comprehensive metabolic panel     Status: Abnormal   Collection Time: 07/30/18 10:43 PM  Result Value Ref Range   Sodium 141 135 - 145 mmol/L   Potassium 3.6 3.5 - 5.1 mmol/L   Chloride 108 98 - 111 mmol/L   CO2 23 22 - 32 mmol/L   Glucose, Bld 136 (H) 70 - 99 mg/dL   BUN 20 8 - 23 mg/dL   Creatinine, Ser 0.85 0.44 - 1.00 mg/dL   Calcium 9.2 8.9 - 10.3 mg/dL   Total Protein 7.3 6.5 - 8.1 g/dL   Albumin 4.4 3.5 - 5.0 g/dL   AST 31 15 - 41 U/L   ALT 11 0 - 44 U/L   Alkaline Phosphatase 113 38 - 126 U/L   Total Bilirubin 1.6 (H) 0.3 - 1.2 mg/dL   GFR calc non Af Amer >60 >60 mL/min   GFR calc Af Amer >60 >60 mL/min  Anion gap 10 5 - 15    Comment: Performed at [facility], [place], [place] [address]  Lactic acid, plasma     Status: Abnormal   Collection Time: 07/30/18 10:43 PM  Result Value Ref Range   Lactic Acid, Venous 2.4 (HH) 0.5 - 1.9 mmol/L    Comment: CRITICAL RESULT CALLED TO, READ BACK BY AND VERIFIED WITH NOEL WEBSTER 07/30/2018 1313 SJL Performed at [facility], [address] [place], [place] [address]   Troponin I - ONCE - STAT     Status: Abnormal   Collection Time:  07/30/18 10:43 PM  Result Value Ref Range   Troponin I 0.20 (HH) <0.03 ng/mL    Comment: CRITICAL RESULT CALLED TO, READ BACK BY AND VERIFIED WITH NOEL WEBSTER 07/30/2018 2313 SJL Performed at [facility], [address] [place], [place] [address]   Lipase, blood     Status: Abnormal   Collection Time: 07/30/18 10:43 PM  Result Value Ref Range   Lipase 144 (H) 11 - 51 U/L    Comment: Performed at [facility], [address], [place], [place] [address]  Magnesium     Status: None   Collection Time: 07/30/18 10:43 PM  Result Value Ref Range   Magnesium 2.2 1.7 - 2.4 mg/dL    Comment: Performed at [facility], [address] [place], [place] [address]  Lactic acid, plasma     Status: None   Collection Time: 07/31/18  1:28 AM  Result Value Ref Range   Lactic Acid, Venous 1.4 0.5 - 1.9 mmol/L    Comment: Performed at [facility], [address] [place], [place] [address]  Urinalysis, Routine w reflex microscopic     Status: Abnormal   Collection Time: 07/31/18  1:28 AM  Result Value Ref Range   Color, Urine YELLOW (A) YELLOW   APPearance CLEAR (A) CLEAR   Specific Gravity, Urine 1.015 1.005 - 1.030   pH 6.0 5.0 - 8.0   Glucose, UA NEGATIVE NEGATIVE mg/dL   Hgb urine dipstick NEGATIVE NEGATIVE   Bilirubin Urine NEGATIVE NEGATIVE   Ketones, ur 5 (A) NEGATIVE mg/dL   Protein, ur NEGATIVE NEGATIVE mg/dL   Nitrite NEGATIVE NEGATIVE   Leukocytes, UA NEGATIVE NEGATIVE    Comment: Performed at [facility], [address] [place], [place] [address]   No results found.  Review of Systems  Constitutional: Negative for chills and fever.  HENT: Negative for sore throat and tinnitus.   Eyes: Negative for blurred vision and redness.  Respiratory: Negative for cough and shortness of breath.   Cardiovascular: Negative for chest pain, palpitations, orthopnea and PND.  Gastrointestinal: Negative for abdominal pain, diarrhea, nausea and  vomiting.  Genitourinary: Negative for dysuria, frequency and urgency.  Musculoskeletal: Negative for joint pain and myalgias.  Skin: Negative for rash.       No lesions  Neurological: Negative for speech change, focal weakness and weakness.  Endo/Heme/Allergies: Does not bruise/bleed easily.       No temperature intolerance  Psychiatric/Behavioral: Negative for depression and suicidal ideas.    Blood pressure (!) 178/74, pulse 75, temperature 98.8 F (37.1 C), temperature source Oral, resp. rate (P) 20, SpO2 97 %. Physical Exam  Vitals reviewed. Constitutional: She is oriented to person, place, and time. She appears well-developed and well-nourished. No distress.  HENT:  Head: Normocephalic and atraumatic.  Mouth/Throat: Oropharynx is clear and moist.  Eyes: Pupils are equal, round, and  reactive to light. Conjunctivae and EOM are normal. No scleral icterus.  Neck: Normal range of motion. Neck supple. No JVD present. No tracheal deviation present. No thyromegaly present.  Cardiovascular: Normal rate, regular rhythm and normal heart sounds. Exam reveals no gallop and no friction rub.  No murmur heard. Respiratory: Effort normal and breath sounds normal.  GI: Soft. Bowel sounds are normal. She exhibits no distension. There is no abdominal tenderness.  Genitourinary:    Genitourinary Comments: Deferred   Musculoskeletal: Normal range of motion.        General: No edema.  Lymphadenopathy:    She has no cervical adenopathy.  Neurological: She is alert and oriented to person, place, and time. No cranial nerve deficit. She exhibits normal muscle tone.  Skin: Skin is warm and dry. No rash noted. No erythema.  Psychiatric: She has a normal mood and affect. Her behavior is normal. Judgment and thought content normal.     Assessment/Plan This is a 78 year old female admitted for hypertensive urgency 1.  Hypertensive urgency: Improving.  I have applied Nitropaste to the patient's chest.  We  will resume home antihypertensives including Norvasc and losartan. 2.  Elevated troponin: Secondary to uncontrolled blood pressure.  Continue to follow cardiac biomarkers.  Monitor telemetry.  The patient has not had chest pain.  Cardiology consultation at the discretion of the primary team. 3.  CAD: Stable; no EKG changes to indicate ischemia.  Continue Plavix 4.  Atrial fibrillation: Paroxysmal; rate controlled.  Continue Eliquis 5.  Leukocytosis: Secondary to Neulasta (etiology of lower extremity pain as well).  No concurrent signs or symptoms of sepsis 6.  Ovarian cancer: No metastasis per PET scan.  Resume dexamethasone once blood pressure under control.  This may help with the patient's lower extremity pain 7.  Hyperlipidemia: Continue double statin therapy and ezetimibe 8.  Depression: Continue Zoloft 9.  DVT prophylaxis: Eliquis 10.  GI prophylaxis: None The patient is a full code.  Time spent on admission orders and patient care approximately 45 minutes  Harrie Foreman, MD 07/31/2018, 2:55 AM

## 2018-07-31 NOTE — Care Management Obs Status (Signed)
[place] NOTIFICATION   Patient Details  Name: Margaret [name] MRN: [id number] Date of Birth: 1941-06-10   Medicare Observation Status Notification Given:  Yes    Margaret [name] Margaret Surya Schroeter, RN 07/31/2018, 4:33 PM

## 2018-07-31 NOTE — ED Notes (Signed)
ED TO INPATIENT HANDOFF REPORT  Name/Age/Gender Margaret [name] 78 y.o. female  Code Status Code Status History    Date Active Date Inactive Code Status Order ID Comments User Context   06/24/2018 0607 06/24/2018 1509 Full Code [id number]  Schermerhorn, Gwen Her, MD Inpatient   01/12/2018 1734 01/14/2018 1751 Full Code [id number]  Fritzi Mandes, MD Inpatient   06/14/2017 0641 06/16/2017 1627 Full Code [id number]  Saundra Shelling, MD ED    Advance Directive Documentation     Most Recent Value  Type of Advance Directive  Living will  Pre-existing out of facility DNR order (yellow form or pink MOST form)  -  "MOST" Form in Place?  -      Home/SNF/Other Home  Chief Complaint Dehydrated  Level of Care/Admitting Diagnosis ED Disposition    ED Disposition Condition [place]: [place] [100120]  Level of Care: Telemetry [5]  Diagnosis: Hypertensive urgency [008676]  Admitting Physician: Harrie Foreman [[id number]]  Attending Physician: Harrie Foreman [[id number]]  PT Class (Do Not Modify): Observation [104]  PT Acc Code (Do Not Modify): Observation [10022]       Medical History Past Medical History:  Diagnosis Date  . Acetabular fracture ([place])   . Anxiety and depression   . Bartholin cyst   . CAD (coronary artery disease)   . Cancer ([place])    mouth cyst was cancer many years ago   . Cellulitis of right leg 2005  . CHF (congestive heart failure) ([place])   . Colon polyp   . Complication of anesthesia   . Diabetes mellitus without complication ([place])   . DJD (degenerative joint disease)   . Dyspnea on exertion   . Dysrhythmia    PAF  . GERD (gastroesophageal reflux disease)   . Hyperlipidemia   . Hypertension   . Morbid obesity ([place])   . Myocardial infarction ([place]) 06/14/2017  . PAF (paroxysmal atrial fibrillation) ([place])    a. diagnosed 7/19; b. CHADS2VASc 8; c. Eliquis  . PONV (postoperative nausea and vomiting)   . Presence of  stent in right coronary artery 02/08/12   2.5x18 Xience distal RCA  . Right shoulder pain   . Stroke ([place]) 2017  . Vaginal prolapse     Allergies Allergies  Allergen Reactions  . Erythromycin Base Nausea And Vomiting  . Tape Itching, Rash and Other (See Comments)    Also "band-aids"    IV Location/Drains/Wounds Patient Lines/Drains/Airways Status   Active Line/Drains/Airways    Name:   Placement date:   Placement time:   Site:   Days:   Implanted Port 07/26/18 Right Chest   07/26/18    1516    Chest   5   Peripheral IV 07/30/18 Right Forearm   07/30/18    2230    Forearm   1   Incision (Closed) 06/24/18 Abdomen   06/24/18    0908     37   Incision (Closed) 06/24/18 Vagina   06/24/18    0908     37   Incision - 3 Ports Abdomen 1: Right 2: Left 3: Umbilicus   09/98/33    8250     37          Labs/Imaging Results for orders placed or performed during the hospital encounter of 07/30/18 (from the past 48 hour(s))  CBC with Differential/Platelet     Status: Abnormal   Collection Time: 07/30/18 10:43 PM  Result Value Ref Range  WBC 25.8 (H) 4.0 - 10.5 K/uL   RBC 5.23 (H) 3.87 - 5.11 MIL/uL   Hemoglobin 14.4 12.0 - 15.0 g/dL   HCT 43.7 36.0 - 46.0 %   MCV 83.6 80.0 - 100.0 fL   MCH 27.5 26.0 - 34.0 pg   MCHC 33.0 30.0 - 36.0 g/dL   RDW 15.2 11.5 - 15.5 %   Platelets 173 150 - 400 K/uL   nRBC 0.0 0.0 - 0.2 %   Neutrophils Relative % 88 %   Neutro Abs 22.4 (H) 1.7 - 7.7 K/uL   Lymphocytes Relative 6 %   Lymphs Abs 1.6 0.7 - 4.0 K/uL   Monocytes Relative 0 %   Monocytes Absolute 0.1 0.1 - 1.0 K/uL   Eosinophils Relative 0 %   Eosinophils Absolute 0.0 0.0 - 0.5 K/uL   Basophils Relative 0 %   Basophils Absolute 0.1 0.0 - 0.1 K/uL   WBC Morphology VACUOLATED NEUTROPHILS    Immature Granulocytes 6 %   Abs Immature Granulocytes 1.58 (H) 0.00 - 0.07 K/uL    Comment: Performed at [facility], [address] [place], [place] [address]  Comprehensive metabolic panel      Status: Abnormal   Collection Time: 07/30/18 10:43 PM  Result Value Ref Range   Sodium 141 135 - 145 mmol/L   Potassium 3.6 3.5 - 5.1 mmol/L   Chloride 108 98 - 111 mmol/L   CO2 23 22 - 32 mmol/L   Glucose, Bld 136 (H) 70 - 99 mg/dL   BUN 20 8 - 23 mg/dL   Creatinine, Ser 0.85 0.44 - 1.00 mg/dL   Calcium 9.2 8.9 - 10.3 mg/dL   Total Protein 7.3 6.5 - 8.1 g/dL   Albumin 4.4 3.5 - 5.0 g/dL   AST 31 15 - 41 U/L   ALT 11 0 - 44 U/L   Alkaline Phosphatase 113 38 - 126 U/L   Total Bilirubin 1.6 (H) 0.3 - 1.2 mg/dL   GFR calc non Af Amer >60 >60 mL/min   GFR calc Af Amer >60 >60 mL/min   Anion gap 10 5 - 15    Comment: Performed at [facility], [address] [place], [place] [address]  Lactic acid, plasma     Status: Abnormal   Collection Time: 07/30/18 10:43 PM  Result Value Ref Range   Lactic Acid, Venous 2.4 (HH) 0.5 - 1.9 mmol/L    Comment: CRITICAL RESULT CALLED TO, READ BACK BY AND VERIFIED WITH Zayon Trulson WEBSTER 07/30/2018 1313 SJL Performed at [facility], [address] [place], [place] [address]   Troponin I - ONCE - STAT     Status: Abnormal   Collection Time: 07/30/18 10:43 PM  Result Value Ref Range   Troponin I 0.20 (HH) <0.03 ng/mL    Comment: CRITICAL RESULT CALLED TO, READ BACK BY AND VERIFIED WITH Heraclio Seidman WEBSTER 07/30/2018 2313 SJL Performed at [facility], [address] [place], [place] [address]   Lipase, blood     Status: Abnormal   Collection Time: 07/30/18 10:43 PM  Result Value Ref Range   Lipase 144 (H) 11 - 51 U/L    Comment: Performed at [facility], [address] [place],  [address]  Magnesium     Status: None   Collection Time: 07/30/18 10:43 PM  Result Value Ref Range   Magnesium 2.2 1.7 - 2.4 mg/dL    Comment: Performed at [facility], [address] [place],  [place] [address]  Lactic acid, plasma     Status: None   Collection Time: 07/31/18  1:28 AM  Result Value Ref Range    Lactic Acid, Venous 1.4 0.5 - 1.9 mmol/L    Comment: Performed at [facility], [address] [place], [place] [address]  Urinalysis, Routine w reflex microscopic     Status: Abnormal   Collection Time: 07/31/18  1:28 AM  Result Value Ref Range   Color, Urine YELLOW (A) YELLOW   APPearance CLEAR (A) CLEAR   Specific Gravity, Urine 1.015 1.005 - 1.030   pH 6.0 5.0 - 8.0   Glucose, UA NEGATIVE NEGATIVE mg/dL   Hgb urine dipstick NEGATIVE NEGATIVE   Bilirubin Urine NEGATIVE NEGATIVE   Ketones, ur 5 (A) NEGATIVE mg/dL   Protein, ur NEGATIVE NEGATIVE mg/dL   Nitrite NEGATIVE NEGATIVE   Leukocytes, UA NEGATIVE NEGATIVE    Comment: Performed at [facility], [address] [place], [place] [address]   No results found.  Pending Labs [employer] (From admission, onward)    Start     Ordered   Signed and Held  TSH  Add-on,   R     Signed and Held   Signed and Held  Troponin I - Now Then Q6H  Now then every 6 hours,   R     Signed and Held          Vitals/Pain Today's Vitals   07/30/18 2330 07/31/18 0010 07/31/18 0035 07/31/18 0127  BP: (!) 204/85   (!) 152/60  Pulse: 87   89  Resp: 19   13  Temp:      TempSrc:      SpO2: 100%   100%  PainSc:  8  5  5      Isolation Precautions No active isolations  Medications Medications  sodium chloride 0.9 % bolus 1,000 mL (0 mLs Intravenous Stopped 07/31/18 0044)  HYDROcodone-acetaminophen (NORCO/VICODIN) 5-325 MG per tablet 1 tablet (1 tablet Oral Given 07/30/18 2341)  amLODipine (NORVASC) tablet 5 mg (5 mg Oral Given 07/31/18 0009)    Mobility walks

## 2018-07-31 NOTE — Progress Notes (Signed)
Pt found by staff member wondering down the hallway looking for her dog. Pt was alert and orientedx4 this morning, Pt states" she does not know where she is at". Bed alarm on, bed on lowest position. Daughter notified. Page Prime, per MD continue to monitor patient. Will round on pt frequently, and pass on to oncoming nurse.

## 2018-07-31 NOTE — Progress Notes (Signed)
[place] [facility] [place] NAME: Margaret [name]    MR#:  [id number]  DATE OF BIRTH:  21-Mar-1941  SUBJECTIVE:   Admitted to the hospital early this morning due to some vague right lower extremity pain and noted to have some accelerated hypertension with elevated troponin.  Patient still complaining of some pain in her leg.  No chest pains, shortness of breath nausea vomiting.  Blood pressures continue to be somewhat labile.  REVIEW OF SYSTEMS:    Review of Systems  Constitutional: Negative for chills and fever.  HENT: Negative for congestion and tinnitus.   Eyes: Negative for blurred vision and double vision.  Respiratory: Negative for cough, shortness of breath and wheezing.   Cardiovascular: Negative for chest pain, orthopnea and PND.  Gastrointestinal: Negative for abdominal pain, diarrhea, nausea and vomiting.  Genitourinary: Negative for dysuria and hematuria.  Neurological: Negative for dizziness, sensory change and focal weakness.  All other systems reviewed and are negative.   Nutrition: Heart Healthy Tolerating Diet: Yes Tolerating PT: Ambulatory  DRUG ALLERGIES:   Allergies  Allergen Reactions  . Erythromycin Base Nausea And Vomiting  . Tape Itching, Rash and Other (See Comments)    Also "band-aids"    VITALS:  Blood pressure (!) 182/87, pulse 81, temperature 98.6 F (37 C), temperature source Oral, resp. rate 20, height 5\' 8"  (1.727 m), weight 80.1 kg, SpO2 99 %.  PHYSICAL EXAMINATION:   Physical Exam  GENERAL:  78 y.o.-year-old patient lying in bed in no acute distress.  EYES: Pupils equal, round, reactive to light and accommodation. No scleral icterus. Extraocular muscles intact.  HEENT: Head atraumatic, normocephalic. Oropharynx and nasopharynx clear.  NECK:  Supple, no jugular venous distention. No thyroid enlargement, no tenderness.  LUNGS: Normal breath sounds bilaterally, no wheezing, rales, rhonchi. No use of  accessory muscles of respiration.  CARDIOVASCULAR: S1, S2 normal. No murmurs, rubs, or gallops. Right chest wall port-a-cath in place.  ABDOMEN: Soft, nontender, nondistended. Bowel sounds present. No organomegaly or mass.  EXTREMITIES: No cyanosis, clubbing or edema b/l.    NEUROLOGIC: Cranial nerves II through XII are intact. No focal Motor or sensory deficits b/l.   PSYCHIATRIC: The patient is alert and oriented x 3.  SKIN: No obvious rash, lesion, or ulcer.    LABORATORY PANEL:   CBC Recent Labs  Lab 07/30/18 2243  WBC 25.8*  HGB 14.4  HCT 43.7  PLT 173   ------------------------------------------------------------------------------------------------------------------  Chemistries  Recent Labs  Lab 07/30/18 2243  NA 141  K 3.6  CL 108  CO2 23  GLUCOSE 136*  BUN 20  CREATININE 0.85  CALCIUM 9.2  MG 2.2  AST 31  ALT 11  ALKPHOS 113  BILITOT 1.6*   ------------------------------------------------------------------------------------------------------------------  Cardiac Enzymes Recent Labs  Lab 07/31/18 0953  TROPONINI 0.17*   ------------------------------------------------------------------------------------------------------------------  RADIOLOGY:  No results found.   ASSESSMENT AND PLAN:   78 year old female with recently diagnosed ovarian cancer, hypertension, GERD, hyperlipidemia, diabetes, coronary artery disease, anxiety/depression, paroxysmal atrial fibrillation who presented to the hospital due to leg pain to be in accelerated hypertension/hypertensive urgency.  1.  Accelerated hypertension- secondary to anxiety/pain. - Continues to have some labile blood pressures. -Continue Norvasc, Losartan, NItro paste -We will place on some as needed IV hydralazine and follow hemodynamics.  2.  Elevated troponin-secondary to supply demand ischemia from accelerated hypertension.  No evidence of acute coronary syndrome. - Troponins have not trended  up.  3.  Per  lipidemia-continue Crestor, Zetia.  4.  Anxiety/depression-continue Zoloft, Ativan as needed.  5.  History of ovarian cancer-recently diagnosed currently undergoing chemotherapy.  Patient follows with Dr. Janese Banks.  6. Hx of Paroxysmal A. Fib - rate controlled.  - cont. Eliquis.   All the records are reviewed and case discussed with Care Management/Social Worker. Management plans discussed with the patient, family and they are in agreement.  CODE STATUS: Full code  DVT Prophylaxis: Eliquis  TOTAL TIME TAKING CARE OF THIS PATIENT: 30 minutes.   POSSIBLE D/C IN 1-2 DAYS, DEPENDING ON CLINICAL CONDITION.   Henreitta Leber M.D on 07/31/2018 at 2:35 PM  Between 7am to 6pm - Pager - [phone]  After 6pm go to [email or web address] - Proofreader  [facility]  Office  [phone]  CC: Primary care physician; Baxter Hire, MD

## 2018-07-31 NOTE — Progress Notes (Signed)
   07/31/18 1200  Clinical Encounter Type  Visited With Patient  Visit Type Initial;Spiritual support  Referral From Nurse  Spiritual Encounters  Spiritual Needs Prayer;Other (Comment)  Advance Directives (For Healthcare)  Does Patient Have a Medical Advance Directive? Yes  Does patient want to make changes to medical advance directive? Yes (Inpatient - patient defers changing a medical advance directive at this time)

## 2018-07-31 NOTE — Progress Notes (Signed)
Dr. Verdell Carmine notified of elevated BP see Flowsheet, received verbal orders for labetelol, will give and continue to assess.

## 2018-07-31 NOTE — Plan of Care (Signed)
Patient admitted at 0330. Patient profile completed. No complaints of pain. Family is at the bedside. Will educate and monitor progress.

## 2018-07-31 NOTE — ED Notes (Signed)
Report finished att 

## 2018-08-01 ENCOUNTER — Telehealth: Payer: Self-pay

## 2018-08-01 DIAGNOSIS — I2510 Atherosclerotic heart disease of native coronary artery without angina pectoris: Secondary | ICD-10-CM | POA: Diagnosis not present

## 2018-08-01 DIAGNOSIS — I160 Hypertensive urgency: Secondary | ICD-10-CM | POA: Diagnosis not present

## 2018-08-01 DIAGNOSIS — R7989 Other specified abnormal findings of blood chemistry: Secondary | ICD-10-CM | POA: Diagnosis not present

## 2018-08-01 DIAGNOSIS — I480 Paroxysmal atrial fibrillation: Secondary | ICD-10-CM | POA: Diagnosis not present

## 2018-08-01 MED ORDER — LOSARTAN POTASSIUM 50 MG PO TABS
100.0000 mg | ORAL_TABLET | Freq: Every day | ORAL | Status: DC
  Administered 2018-08-01: 100 mg via ORAL
  Filled 2018-08-01: qty 2

## 2018-08-01 MED ORDER — LOSARTAN POTASSIUM 100 MG PO TABS: 100.0000 mg | ORAL_TABLET | Freq: Every day | ORAL | 1 refills | Status: DC

## 2018-08-01 NOTE — Telephone Encounter (Signed)
Copy of [employer] germline testing mailed to home address and copy sent to medical records for scanning.

## 2018-08-01 NOTE — Clinical Social Work Note (Addendum)
CSW received consult that patient was asking how to apply for Medicaid.  CSW provided her with the contact number of [facility].  CSW also informed her to work with the [facility], they can assist with helping her with the financial assistance of applying for Medicaid, and paying for her Chemo.  CSW also informed her that it can take 30-45 days for approval, and suggested she start as soon as she can.  CSW to sign off, please reconsult if social work needs arise.  Jones Broom. Margaret [name], MSW, [place]  08/01/2018 10:35 AM

## 2018-08-01 NOTE — Progress Notes (Signed)
Patient discharged home as per order, IV removed , tele removed prior to discharge

## 2018-08-01 NOTE — Discharge Summary (Signed)
[place] at [place] NAME: Margaret [name]    MR#:  [id number]  DATE OF BIRTH:  01-Apr-1941  DATE OF ADMISSION:  07/30/2018 ADMITTING PHYSICIAN: Harrie Foreman, MD  DATE OF DISCHARGE: 08/01/2018  PRIMARY CARE PHYSICIAN: Baxter Hire, MD    ADMISSION DIAGNOSIS:  Uncontrolled hypertension [I10] Elevated troponin I level [R79.89] Elevated lactic acid level [R79.89] Elevated lipase [R74.8] Leukocytosis, unspecified type [D72.829]  DISCHARGE DIAGNOSIS:  Active Problems:   Hypertensive urgency   SECONDARY DIAGNOSIS:   Past Medical History:  Diagnosis Date  . Acetabular fracture ([place])   . Anxiety and depression   . Bartholin cyst   . CAD (coronary artery disease)   . Cancer ([place])    mouth cyst was cancer many years ago   . Cellulitis of right leg 2005  . CHF (congestive heart failure) ([place])   . Colon polyp   . Complication of anesthesia   . Diabetes mellitus without complication ([place])   . DJD (degenerative joint disease)   . Dyspnea on exertion   . Dysrhythmia    PAF  . GERD (gastroesophageal reflux disease)   . Hyperlipidemia   . Hypertension   . Morbid obesity ([place])   . Myocardial infarction ([place]) 06/14/2017  . PAF (paroxysmal atrial fibrillation) ([place])    a. diagnosed 7/19; b. CHADS2VASc 8; c. Eliquis  . PONV (postoperative nausea and vomiting)   . Presence of stent in right coronary artery 02/08/12   2.5x18 Xience distal RCA  . Right shoulder pain   . Stroke ([place]) 2017  . Vaginal prolapse     HOSPITAL COURSE:   78 year old female with recently diagnosed ovarian cancer, hypertension, GERD, hyperlipidemia, diabetes, coronary artery disease, anxiety/depression, paroxysmal atrial fibrillation who presented to the hospital due to leg pain to be in accelerated hypertension/hypertensive urgency.  1.  Accelerated hypertension- secondary to anxiety/pain. Patient was maintained on her Norvasc, losartan, given some IV  labetalol and hydralazine. -Blood pressures have improved since admission.  Patient's losartan dose was advanced, she will continue her Norvasc.  Further changes to her blood pressures medications can be done as an outpatient.  2.  Elevated troponin-secondary to supply demand ischemia from accelerated hypertension.  No evidence of acute coronary syndrome. - Troponins did not trended up.  3.  Hyperlipidemia-continue Crestor, Zetia.  4.  Anxiety/depression- she will continue Zoloft, Ativan as needed.  5.  History of ovarian cancer-recently diagnosed currently undergoing chemotherapy.  Patient follows with Dr. Janese Banks and will cont. Follow up with her.   6. Hx of Paroxysmal A. Fib - rate controlled.  - she will cont. Eliquis.   DISCHARGE CONDITIONS:   Stable.   CONSULTS OBTAINED:    DRUG ALLERGIES:   Allergies  Allergen Reactions  . Erythromycin Base Nausea And Vomiting  . Tape Itching, Rash and Other (See Comments)    Also "band-aids"    DISCHARGE MEDICATIONS:   Allergies as of 08/01/2018      Reactions   Erythromycin Base Nausea And Vomiting   Tape Itching, Rash, Other (See Comments)   Also "band-aids"      Medication List    TAKE these medications   amLODipine 2.5 MG tablet Commonly known as:  NORVASC Take 1 tablet (2.5 mg total) by mouth daily.   apixaban 5 MG Tabs tablet Commonly known as:  ELIQUIS Take 1 tablet (5 mg total) by mouth 2 (two) times daily.   clopidogrel 75 MG tablet Commonly known as:  PLAVIX  Take 1 tablet (75 mg total) by mouth daily.   cyanocobalamin 1000 MCG/ML injection Commonly known as:  (VITAMIN B-12) Inject 1,000 mcg into the muscle every 30 (thirty) days.   cyclobenzaprine 10 MG tablet Commonly known as:  FLEXERIL Take 10 mg by mouth 2 (two) times daily as needed for spasms or pain.   dexamethasone 4 MG tablet Commonly known as:  DECADRON Take 2 tablets (8 mg total) by mouth daily. Start the day after chemotherapy for 2  days.   ergocalciferol 1.25 MG (50000 UT) capsule Commonly known as:  VITAMIN D2 Take 50,000 Units by mouth every Monday.   ezetimibe 10 MG tablet Commonly known as:  ZETIA TAKE 1 TABLET(10 MG) BY MOUTH DAILY What changed:  See the new instructions.   HYDROcodone-acetaminophen 5-325 MG tablet Commonly known as:  NORCO/VICODIN Take 1 tablet by mouth at bedtime as needed for moderate pain.   lidocaine-prilocaine cream Commonly known as:  EMLA Apply to affected area once   LORazepam 0.5 MG tablet Commonly known as:  ATIVAN Take 1 tablet (0.5 mg total) by mouth every 6 (six) hours as needed (Nausea or vomiting).   losartan 100 MG tablet Commonly known as:  COZAAR Take 1 tablet (100 mg total) by mouth daily. What changed:    medication strength  how much to take   multivitamin with minerals Tabs tablet Take 1 tablet by mouth daily.   nitroGLYCERIN 0.4 MG SL tablet Commonly known as:  NITROSTAT Place 1 tablet (0.4 mg total) under the tongue every 5 (five) minutes as needed for chest pain.   OCUVITE PO Take 1 capsule by mouth daily.   ondansetron 4 MG tablet Commonly known as:  ZOFRAN Take 4 mg by mouth every 8 (eight) hours as needed for nausea or vomiting.   prochlorperazine 10 MG tablet Commonly known as:  COMPAZINE Take 1 tablet (10 mg total) by mouth every 6 (six) hours as needed (Nausea or vomiting).   protein supplement shake Liqd Commonly known as:  PREMIER PROTEIN Take 325 mLs (11 oz total) by mouth 2 (two) times daily between meals.   rosuvastatin 40 MG tablet Commonly known as:  CRESTOR Take 40 mg by mouth at bedtime.   SENNA S 8.6-50 MG tablet Generic drug:  senna-docusate Take 1 tablet by mouth daily.   sertraline 100 MG tablet Commonly known as:  ZOLOFT Take 100 mg by mouth daily.         DISCHARGE INSTRUCTIONS:   DIET:  Cardiac diet  DISCHARGE CONDITION:  Stable  ACTIVITY:  Activity as tolerated  OXYGEN:  Home Oxygen: No.    Oxygen Delivery: room air  DISCHARGE LOCATION:  home   If you experience worsening of your admission symptoms, develop shortness of breath, life threatening emergency, suicidal or homicidal thoughts you must seek medical attention immediately by calling 911 or calling your MD immediately  if symptoms less severe.  You Must read complete instructions/literature along with all the possible adverse reactions/side effects for all the Medicines you take and that have been prescribed to you. Take any new Medicines after you have completely understood and accpet all the possible adverse reactions/side effects.   Please note  You were cared for by a hospitalist during your hospital stay. If you have any questions about your discharge medications or the care you received while you were in the hospital after you are discharged, you can call the unit and asked to speak with the hospitalist on call if the hospitalist  that took care of you is not available. Once you are discharged, your primary care physician will handle any further medical issues. Please note that NO REFILLS for any discharge medications will be authorized once you are discharged, as it is imperative that you return to your primary care physician (or establish a relationship with a primary care physician if you do not have one) for your aftercare needs so that they can reassess your need for medications and monitor your lab values.     Today   No acute events overnight, blood pressure has improved.  Denies any chest pain shortness of breath or dizziness.  Will discharge home later today.  VITAL SIGNS:  Blood pressure (!) 156/73, pulse 90, temperature 98.7 F (37.1 C), temperature source Oral, resp. rate 20, height 5\' 8"  (1.727 m), weight 79.7 kg, SpO2 97 %.  I/O:    Intake/Output Summary (Last 24 hours) at 08/01/2018 1307 Last data filed at 08/01/2018 0936 Gross per 24 hour  Intake 143.53 ml  Output 100 ml  Net 43.53 ml     PHYSICAL EXAMINATION:  GENERAL:  78 y.o.-year-old patient lying in the bed with no acute distress.  EYES: Pupils equal, round, reactive to light and accommodation. No scleral icterus. Extraocular muscles intact.  HEENT: Head atraumatic, normocephalic. Oropharynx and nasopharynx clear.  NECK:  Supple, no jugular venous distention. No thyroid enlargement, no tenderness.  LUNGS: Normal breath sounds bilaterally, no wheezing, rales,rhonchi. No use of accessory muscles of respiration.  CARDIOVASCULAR: S1, S2 normal. No murmurs, rubs, or gallops. Right chest wall port-a-cath in place.  ABDOMEN: Soft, non-tender, non-distended. Bowel sounds present. No organomegaly or mass.  EXTREMITIES: No pedal edema, cyanosis, or clubbing.  NEUROLOGIC: Cranial nerves II through XII are intact. No focal motor or sensory defecits b/l.  PSYCHIATRIC: The patient is alert and oriented x 3.   SKIN: No obvious rash, lesion, or ulcer.   DATA REVIEW:   CBC Recent Labs  Lab 07/30/18 2243  WBC 25.8*  HGB 14.4  HCT 43.7  PLT 173    Chemistries  Recent Labs  Lab 07/30/18 2243  NA 141  K 3.6  CL 108  CO2 23  GLUCOSE 136*  BUN 20  CREATININE 0.85  CALCIUM 9.2  MG 2.2  AST 31  ALT 11  ALKPHOS 113  BILITOT 1.6*    Cardiac Enzymes Recent Labs  Lab 07/31/18 1541  TROPONINI 0.12*     RADIOLOGY:  No results found.    Management plans discussed with the patient, family and they are in agreement.  CODE STATUS:     Code Status Orders  (From admission, onward)         Start     Ordered   07/31/18 0342  Full code  Continuous     07/31/18 0341        TOTAL TIME TAKING CARE OF THIS PATIENT: 40 minutes.    Henreitta Leber M.D on 08/01/2018 at 1:07 PM  Between 7am to 6pm - Pager - [phone]  After 6pm go to [email or web address] - Proofreader  [facility]  Office  [phone]  CC: Primary care physician; Baxter Hire, MD

## 2018-08-02 DIAGNOSIS — E538 Deficiency of other specified B group vitamins: Secondary | ICD-10-CM | POA: Diagnosis not present

## 2018-08-05 ENCOUNTER — Telehealth: Payer: Self-pay | Admitting: *Deleted

## 2018-08-05 DIAGNOSIS — Z09 Encounter for follow-up examination after completed treatment for conditions other than malignant neoplasm: Secondary | ICD-10-CM | POA: Diagnosis not present

## 2018-08-05 DIAGNOSIS — I10 Essential (primary) hypertension: Secondary | ICD-10-CM | POA: Diagnosis not present

## 2018-08-05 NOTE — Telephone Encounter (Signed)
Patient cad reporting she has not had BM in 4 days. She is taking her stool softeners and took Miralax with no results. She denies having tried MOM or any other over the counter medicine for this. She is asking what else she can take. She had her first chemotherapy treatment last week. Please advise.

## 2018-08-05 NOTE — Telephone Encounter (Signed)
Please ask her to start taking senna 2 tab at night and see if it helps along with MOM

## 2018-08-05 NOTE — Telephone Encounter (Signed)
Patient informed of physician response and repeated back to me, She confirmed her appointment for Monday while on the phone also

## 2018-08-08 ENCOUNTER — Inpatient Hospital Stay (HOSPITAL_BASED_OUTPATIENT_CLINIC_OR_DEPARTMENT_OTHER): Payer: PPO | Admitting: Oncology

## 2018-08-08 ENCOUNTER — Inpatient Hospital Stay: Payer: PPO

## 2018-08-08 ENCOUNTER — Other Ambulatory Visit: Payer: Self-pay

## 2018-08-08 DIAGNOSIS — I509 Heart failure, unspecified: Secondary | ICD-10-CM

## 2018-08-08 DIAGNOSIS — C569 Malignant neoplasm of unspecified ovary: Secondary | ICD-10-CM

## 2018-08-08 DIAGNOSIS — I480 Paroxysmal atrial fibrillation: Secondary | ICD-10-CM | POA: Diagnosis not present

## 2018-08-08 DIAGNOSIS — F329 Major depressive disorder, single episode, unspecified: Secondary | ICD-10-CM

## 2018-08-08 DIAGNOSIS — Z90710 Acquired absence of both cervix and uterus: Secondary | ICD-10-CM

## 2018-08-08 DIAGNOSIS — Z90722 Acquired absence of ovaries, bilateral: Secondary | ICD-10-CM

## 2018-08-08 DIAGNOSIS — I252 Old myocardial infarction: Secondary | ICD-10-CM

## 2018-08-08 DIAGNOSIS — Z5111 Encounter for antineoplastic chemotherapy: Secondary | ICD-10-CM | POA: Diagnosis not present

## 2018-08-08 DIAGNOSIS — Z9884 Bariatric surgery status: Secondary | ICD-10-CM

## 2018-08-08 DIAGNOSIS — I2510 Atherosclerotic heart disease of native coronary artery without angina pectoris: Secondary | ICD-10-CM

## 2018-08-08 DIAGNOSIS — G8929 Other chronic pain: Secondary | ICD-10-CM

## 2018-08-08 DIAGNOSIS — Z87891 Personal history of nicotine dependence: Secondary | ICD-10-CM

## 2018-08-08 DIAGNOSIS — D509 Iron deficiency anemia, unspecified: Secondary | ICD-10-CM

## 2018-08-08 DIAGNOSIS — Z8673 Personal history of transient ischemic attack (TIA), and cerebral infarction without residual deficits: Secondary | ICD-10-CM | POA: Diagnosis not present

## 2018-08-08 DIAGNOSIS — Z7902 Long term (current) use of antithrombotics/antiplatelets: Secondary | ICD-10-CM

## 2018-08-08 DIAGNOSIS — E119 Type 2 diabetes mellitus without complications: Secondary | ICD-10-CM | POA: Diagnosis not present

## 2018-08-08 DIAGNOSIS — C561 Malignant neoplasm of right ovary: Secondary | ICD-10-CM | POA: Diagnosis not present

## 2018-08-08 DIAGNOSIS — C562 Malignant neoplasm of left ovary: Secondary | ICD-10-CM | POA: Diagnosis not present

## 2018-08-08 DIAGNOSIS — I10 Essential (primary) hypertension: Secondary | ICD-10-CM | POA: Diagnosis not present

## 2018-08-08 DIAGNOSIS — Z79899 Other long term (current) drug therapy: Secondary | ICD-10-CM

## 2018-08-08 DIAGNOSIS — Z7901 Long term (current) use of anticoagulants: Secondary | ICD-10-CM

## 2018-08-08 DIAGNOSIS — Z9079 Acquired absence of other genital organ(s): Secondary | ICD-10-CM

## 2018-08-08 LAB — COMPREHENSIVE METABOLIC PANEL
ALT: 17 U/L (ref 0–44)
AST: 23 U/L (ref 15–41)
Albumin: 3.8 g/dL (ref 3.5–5.0)
Alkaline Phosphatase: 130 U/L — ABNORMAL HIGH (ref 38–126)
Anion gap: 9 (ref 5–15)
BUN: 18 mg/dL (ref 8–23)
CO2: 22 mmol/L (ref 22–32)
Calcium: 9 mg/dL (ref 8.9–10.3)
Chloride: 111 mmol/L (ref 98–111)
Creatinine, Ser: 1.04 mg/dL — ABNORMAL HIGH (ref 0.44–1.00)
GFR calc non Af Amer: 51 mL/min — ABNORMAL LOW (ref >60)
GFR, EST AFRICAN AMERICAN: 59 mL/min — AB (ref >60)
Glucose, Bld: 169 mg/dL — ABNORMAL HIGH (ref 70–99)
Potassium: 3.4 mmol/L — ABNORMAL LOW (ref 3.5–5.1)
Sodium: 142 mmol/L (ref 135–145)
Total Bilirubin: 0.6 mg/dL (ref 0.3–1.2)
Total Protein: 6.5 g/dL (ref 6.5–8.1)

## 2018-08-08 LAB — CBC WITH DIFFERENTIAL/PLATELET
Abs Immature Granulocytes: 1.67 10*3/uL — ABNORMAL HIGH (ref 0.00–0.07)
BASOS ABS: 0 10*3/uL (ref 0.0–0.1)
Basophils Relative: 0 %
Eosinophils Absolute: 0.1 10*3/uL (ref 0.0–0.5)
Eosinophils Relative: 1 %
HCT: 40.4 % (ref 36.0–46.0)
Hemoglobin: 13.3 g/dL (ref 12.0–15.0)
Immature Granulocytes: 13 %
Lymphocytes Relative: 16 %
Lymphs Abs: 2 10*3/uL (ref 0.7–4.0)
MCH: 28.4 pg (ref 26.0–34.0)
MCHC: 32.9 g/dL (ref 30.0–36.0)
MCV: 86.3 fL (ref 80.0–100.0)
MONOS PCT: 5 %
Monocytes Absolute: 0.7 10*3/uL (ref 0.1–1.0)
NEUTROS PCT: 65 %
NRBC: 0 % (ref 0.0–0.2)
Neutro Abs: 8.1 10*3/uL — ABNORMAL HIGH (ref 1.7–7.7)
Platelets: 213 10*3/uL (ref 150–400)
RBC: 4.68 MIL/uL (ref 3.87–5.11)
RDW: 14.6 % (ref 11.5–15.5)
Smear Review: ADEQUATE
WBC Morphology: 6
WBC: 12.6 10*3/uL — ABNORMAL HIGH (ref 4.0–10.5)

## 2018-08-08 MED ORDER — DEXAMETHASONE 4 MG PO TABS: 8.0000 mg | ORAL_TABLET | Freq: Every day | ORAL | 1 refills | Status: DC

## 2018-08-08 NOTE — Progress Notes (Signed)
Here for follow up. " I feel semi poorly " per pt

## 2018-08-09 ENCOUNTER — Encounter: Payer: Self-pay | Admitting: Oncology

## 2018-08-09 NOTE — Progress Notes (Signed)
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 She is on Eliquis and Plavix.   She was initially seen by GI for iron deficiency anemia and positive stool occult. Endoscopy did not reveal any source of bleeding or malignancy. Colonoscopy could not be completed as patient was retching during the procedure and plan was to reschedule colonoscopy under intubation. During that work-up she underwent a CT abdomen and which showed a 4.6 cm right ovarian mass which was initially presumed to be a cyst. Patient has had a total vaginal hysterectomy done about 20 years ago. She was seen by Dr. Ouida Sills and underwent laparoscopic bilateral salpingo-oophorectomy with lysis of adhesions on 06/24/2018. Pathology showed microscopic focus of high-grade serous carcinoma involving both the left and the right ovary  DIAGNOSIS:  A. OVARY AND FALLOPIAN TUBE, LEFT; SALPINGO-OOPHORECTOMY:  OVARY  - MICROSCOPIC FOCUS OF HIGH-GRADE SEROUS CARCINOMA, SEE COMMENT.  - SEROUS CYST ADENOFIBROMA.  - MUCINOUS CYSTADENOMA WITH FOCAL PROLIFERATIVE FEATURES.  - FOCI SUGGESTIVE OF ENDOMETRIOSIS.  - FALLOPIAN TUBE WITH BENIGN PARATUBAL CYST.   B. OVARY AND FALLOPIAN TUBE, RIGHT; SALPINGO-OOPHORECTOMY:  OVARY  - THREE MICROSCOPIC FOCI OF HIGH-GRADE SEROUS CARCINOMA, SEE COMMENT.  - OVARY WITH  SEROUS CYST ADENOFIBROMA.  - ENDOMETRIOSIS.  - FIBROUS SEROSAL ADHESIONS.  - FALLOPIAN TUBE WITH BENIGN PARATUBAL CYSTS.    CT chest abdomen and pelvis on 07/12/2018 showed a right lower lobe pulmonary nodule mildly larger than September 2019 which raises the concern for malignancy. No other evidence of malignancy elsewhere. Patient then underwent a PET scan on 07/19/2018 which showed a right lower lobe lung nodule was not hypermetabolic with an SUV of 1.7 below mediastinal background activity and no enlarged hypermetabolic mediastinal or hilar lymph nodes. This was considered likely benign  Patient's case was discussed with GYN conference and patient saw Dr. Fransisca Connors as well and adjuvant chemotherapy with carboplatin and Taxol was recommended given high risk of recurrence. Baseline Ca1 25 was normal   Interval history-she tolerated cycle 1 of carbotaxol chemotherapy well without any significant side effects such as nausea or vomiting.  She did however present to the ER with symptoms of worsening left lower extremity pain and back pain and at that point was found to have hypertensive urgency which was subsequently treated.  She was also seen by Dr. Edwina Barth post discharge.  ECOG PS- 1 Pain scale- 4  Review of systems- Review of Systems  Constitutional: Positive for malaise/fatigue. Negative for chills, fever and weight loss.  HENT: Negative for congestion, ear discharge and nosebleeds.   Eyes: Negative for blurred vision.  Respiratory: Negative for cough, hemoptysis, sputum production, shortness of breath and wheezing.   Cardiovascular: Negative for chest pain, palpitations, orthopnea and claudication.  Gastrointestinal: Negative for abdominal pain, blood in stool, constipation, diarrhea, heartburn, melena, nausea and vomiting.  Genitourinary: Negative for dysuria, flank pain, frequency, hematuria and urgency.  Musculoskeletal: Positive for back pain. Negative for joint  pain and  myalgias.  Skin: Negative for rash.  Neurological: Negative for dizziness, tingling, focal weakness, seizures, weakness and headaches.  Endo/Heme/Allergies: Does not bruise/bleed easily.  Psychiatric/Behavioral: Negative for depression and suicidal ideas. The patient does not have insomnia.       Allergies  Allergen Reactions  . Erythromycin Base Nausea And Vomiting  . Tape Itching, Rash and Other (See Comments)    Also "band-aids"     Past Medical History:  Diagnosis Date  . Acetabular fracture ([place])   . Anxiety and depression   . Bartholin cyst   . CAD (coronary artery disease)   . Cancer ([place])    mouth cyst was cancer many years ago   . Cellulitis of right leg 2005  . CHF (congestive heart failure) ([place])   . Colon polyp   . Complication of anesthesia   . Diabetes mellitus without complication ([place])   . DJD (degenerative joint disease)   . Dyspnea on exertion   . Dysrhythmia    PAF  . GERD (gastroesophageal reflux disease)   . Hyperlipidemia   . Hypertension   . Morbid obesity ([place])   . Myocardial infarction ([place]) 06/14/2017  . PAF (paroxysmal atrial fibrillation) ([place])    a. diagnosed 7/19; b. CHADS2VASc 8; c. Eliquis  . PONV (postoperative nausea and vomiting)   . Presence of stent in right coronary artery 02/08/12   2.5x18 Xience distal RCA  . Right shoulder pain   . Stroke ([place]) 2017  . Vaginal prolapse      Past Surgical History:  Procedure Laterality Date  . ABDOMINAL HYSTERECTOMY     supracervical abdominal w/removal tubes &/or ovaries  . BREAST BIOPSY  1989  . CHOLECYSTECTOMY    . COLONOSCOPY    . COLONOSCOPY WITH PROPOFOL N/A 06/21/2018   Procedure: COLONOSCOPY WITH PROPOFOL;  Surgeon: Lollie Sails, MD;  Location: [facility] ENDOSCOPY;  Service: Endoscopy;  Laterality: N/A;  . CORONARY STENT INTERVENTION N/A 06/14/2017   Procedure: CORONARY STENT INTERVENTION;  Surgeon: Wellington Hampshire, MD;  Location: [place] [facility] LAB;  Service:  Cardiovascular;  Laterality: N/A;  . ESOPHAGOGASTRODUODENOSCOPY N/A 06/21/2018   Procedure: ESOPHAGOGASTRODUODENOSCOPY (EGD);  Surgeon: Lollie Sails, MD;  Location: [facility] ENDOSCOPY;  Service: Endoscopy;  Laterality: N/A;  . FACIAL COSMETIC SURGERY     face/neck lift  . FRACTURE SURGERY    . GASTRIC BYPASS  2008  . JOINT REPLACEMENT    . KNEE SURGERY     left knee arthroscopic  . LAPAROSCOPIC BILATERAL SALPINGO OOPHERECTOMY Bilateral 06/24/2018   Procedure: LAPAROSCOPIC BILATERAL SALPINGO OOPHORECTOMY;  Surgeon: Schermerhorn, Gwen Her, MD;  Location: [facility] ORS;  Service: Gynecology;  Laterality: Bilateral;  . LEFT HEART CATH AND CORONARY ANGIOGRAPHY N/A 06/14/2017   Procedure: LEFT HEART CATH AND CORONARY ANGIOGRAPHY;  Surgeon: Wellington Hampshire, MD;  Location: [place] [facility] LAB;  Service: Cardiovascular;  Laterality: N/A;  . LEFT HEART CATH AND CORONARY ANGIOGRAPHY N/A 06/14/2017   Procedure: LEFT HEART CATH AND CORONARY ANGIOGRAPHY;  Surgeon: Wellington Hampshire, MD;  Location: [place];  Service: Cardiovascular;  Laterality: N/A;  . LYSIS OF ADHESION  06/24/2018   Procedure: LYSIS OF ADHESION;  Surgeon: Ouida Sills Gwen Her, MD;  Location: [facility] ORS;  Service: Gynecology;;  . Crisoforo [place]   left hip fracture/repair and left arm fracture  . OTHER SURGICAL HISTORY  1986   hysterectomy  . PORTA CATH INSERTION N/A 07/26/2018   Procedure: PORTA CATH INSERTION;  Surgeon: Hortencia Pilar  G, MD;  Location: [place] [facility];  Service: Cardiovascular;  Laterality: N/A;  . right knee  late 90's   arthroscopic  . ROUX-EN-Y PROCEDURE    . SALIVARY GLAND SURGERY    . TOTAL HIP ARTHROPLASTY  2004   left    Social History   Socioeconomic History  . Marital status: Married    Spouse name: Not on file  . Number of children: 1  . Years of education: Not on file  . Highest education level: Not on file  Occupational History  . Occupation: nurse-retired  Social Needs  . Financial  resource strain: Not on file  . Food insecurity:    Worry: Not on file    Inability: Not on file  . Transportation needs:    Medical: Not on file    Non-medical: Not on file  Tobacco Use  . Smoking status: Former Smoker    Packs/day: 2.00    Years: 26.00    Pack years: 52.00    Types: Cigarettes    Last attempt to quit: 06/22/1984    Years since quitting: 34.1  . Smokeless tobacco: Never Used  Substance and Sexual Activity  . Alcohol use: Yes    Alcohol/week: 0.0 standard drinks    Frequency: Never    Comment: rare- less than once a month  . Drug use: No  . Sexual activity: Yes    Birth control/protection: Surgical    Comment: hysterectomy  Lifestyle  . Physical activity:    Days per week: Not on file    Minutes per session: Not on file  . Stress: Not on file  Relationships  . Social connections:    Talks on phone: Not on file    Gets together: Not on file    Attends religious service: Not on file    Active member of club or organization: Not on file    Attends meetings of clubs or organizations: Not on file    Relationship status: Not on file  . Intimate partner violence:    Fear of current or ex partner: Not on file    Emotionally abused: Not on file    Physically abused: Not on file    Forced sexual activity: Not on file  Other Topics Concern  . Not on file  Social History Narrative  . Not on file    Family History  Problem Relation Age of Onset  . Heart attack Father        MI age 16 and 2  . Heart attack Sister 8       cabg; deceased 9  . Diabetes Sister   . Hypertension Sister   . Heart attack Brother 35       cabg; deceased 4s  . Diabetes Brother   . Colon cancer Mother 73       deceased 61  . Diabetes Maternal Grandmother   . Cancer Maternal Grandfather        unknown type; deceased  . Hypertension Daughter   . Thyroid disease Daughter   . Breast cancer Neg Hx      Current Outpatient Medications:  .  amLODipine (NORVASC) 2.5 MG tablet,  Take 1 tablet (2.5 mg total) by mouth daily., Disp: 90 tablet, Rfl: 3 .  apixaban (ELIQUIS) 5 MG TABS tablet, Take 1 tablet (5 mg total) by mouth 2 (two) times daily., Disp: 60 tablet, Rfl: 0 .  clopidogrel (PLAVIX) 75 MG tablet, Take 1 tablet (75 mg total) by mouth  daily., Disp: 30 tablet, Rfl: 0 .  cyanocobalamin (,VITAMIN B-12,) 1000 MCG/ML injection, Inject 1,000 mcg into the muscle every 30 (thirty) days., Disp: , Rfl:  .  dexamethasone (DECADRON) 4 MG tablet, Take 2 tablets (8 mg total) by mouth daily. Start the day after chemotherapy for 2 days., Disp: 30 tablet, Rfl: 1 .  ergocalciferol (VITAMIN D2) 1.25 MG (50000 UT) capsule, Take 50,000 Units by mouth every Monday., Disp: , Rfl:  .  ezetimibe (ZETIA) 10 MG tablet, TAKE 1 TABLET(10 MG) BY MOUTH DAILY (Patient taking differently: Take 10 mg by mouth daily. ), Disp: 30 tablet, Rfl: 5 .  ferrous sulfate 325 (65 FE) MG tablet, Take 325 mg by mouth daily with breakfast., Disp: , Rfl:  .  lidocaine-prilocaine (EMLA) cream, Apply to affected area once, Disp: 30 g, Rfl: 3 .  losartan (COZAAR) 100 MG tablet, Take 1 tablet (100 mg total) by mouth daily., Disp: 30 tablet, Rfl: 1 .  Multiple Vitamin (MULTIVITAMIN WITH MINERALS) TABS tablet, Take 1 tablet by mouth daily., Disp: 30 tablet, Rfl: 1 .  Multiple Vitamins-Minerals (OCUVITE PO), Take 1 capsule by mouth daily., Disp: , Rfl:  .  protein supplement shake (PREMIER PROTEIN) LIQD, Take 325 mLs (11 oz total) by mouth 2 (two) times daily between meals., Disp: 60 Can, Rfl: 0 .  rosuvastatin (CRESTOR) 40 MG tablet, Take 40 mg by mouth at bedtime., Disp: , Rfl:  .  senna-docusate (SENNA S) 8.6-50 MG tablet, Take 1 tablet by mouth daily. , Disp: , Rfl:  .  sertraline (ZOLOFT) 100 MG tablet, Take 100 mg by mouth daily. , Disp: , Rfl:  .  Vitamin D, Ergocalciferol, (DRISDOL) 1.25 MG (50000 UT) CAPS capsule, Take 50,000 Units by mouth every 7 (seven) days. , Disp: , Rfl:  .  cyclobenzaprine (FLEXERIL) 10 MG  tablet, Take 10 mg by mouth 2 (two) times daily as needed for spasms or pain., Disp: , Rfl:  .  HYDROcodone-acetaminophen (NORCO/VICODIN) 5-325 MG tablet, Take 1 tablet by mouth at bedtime as needed for moderate pain. (Patient not taking: Reported on 08/08/2018), Disp: 30 tablet, Rfl: 0 .  LORazepam (ATIVAN) 0.5 MG tablet, Take 1 tablet (0.5 mg total) by mouth every 6 (six) hours as needed (Nausea or vomiting). (Patient not taking: Reported on 08/08/2018), Disp: 30 tablet, Rfl: 0 .  nitroGLYCERIN (NITROSTAT) 0.4 MG SL tablet, Place 1 tablet (0.4 mg total) under the tongue every 5 (five) minutes as needed for chest pain. (Patient not taking: Reported on 08/08/2018), Disp: 30 tablet, Rfl: 2 .  ondansetron (ZOFRAN) 4 MG tablet, Take 4 mg by mouth every 8 (eight) hours as needed for nausea or vomiting., Disp: , Rfl:  .  prochlorperazine (COMPAZINE) 10 MG tablet, Take 1 tablet (10 mg total) by mouth every 6 (six) hours as needed (Nausea or vomiting). (Patient not taking: Reported on 08/08/2018), Disp: 30 tablet, Rfl: 1  Physical exam:  Vitals:   08/08/18 0936  BP: 128/82  Pulse: 78  Resp: 18  Temp: (!) 95.7 F (35.4 C)  TempSrc: Tympanic  Weight: 170 lb (77.1 kg)   Physical Exam Constitutional:      General: She is not in acute distress. HENT:     Head: Normocephalic and atraumatic.  Eyes:     Pupils: Pupils are equal, round, and reactive to light.  Neck:     Musculoskeletal: Normal range of motion.  Cardiovascular:     Rate and Rhythm: Normal rate and regular rhythm.  Heart sounds: Normal heart sounds.  Pulmonary:     Effort: Pulmonary effort is normal.     Breath sounds: Normal breath sounds.  Abdominal:     General: Bowel sounds are normal.     Palpations: Abdomen is soft.  Skin:    General: Skin is warm and dry.  Neurological:     Mental Status: She is alert and oriented to person, place, and time.      CMP Latest Ref Rng & Units 08/08/2018  Glucose 70 - 99 mg/dL 169(H)  BUN  8 - 23 mg/dL 18  Creatinine 0.44 - 1.00 mg/dL 1.04(H)  Sodium 135 - 145 mmol/L 142  Potassium 3.5 - 5.1 mmol/L 3.4(L)  Chloride 98 - 111 mmol/L 111  CO2 22 - 32 mmol/L 22  Calcium 8.9 - 10.3 mg/dL 9.0  Total Protein 6.5 - 8.1 g/dL 6.5  Total Bilirubin 0.3 - 1.2 mg/dL 0.6  Alkaline Phos 38 - 126 U/L 130(H)  AST 15 - 41 U/L 23  ALT 0 - 44 U/L 17   CBC Latest Ref Rng & Units 08/08/2018  WBC 4.0 - 10.5 K/uL 12.6(H)  Hemoglobin 12.0 - 15.0 g/dL 13.3  Hematocrit 36.0 - 46.0 % 40.4  Platelets 150 - 400 K/uL 213    No images are attached to the encounter.  Ct Chest W Contrast  Result Date: 07/12/2018 CLINICAL DATA:  Ovarian malignancy restaging.  Lung nodule. EXAM: CT CHEST, ABDOMEN, AND PELVIS WITH CONTRAST TECHNIQUE: Multidetector CT imaging of the chest, abdomen and pelvis was performed following the standard protocol during bolus administration of intravenous contrast. CONTRAST:  13mL OMNIPAQUE IOHEXOL 300 MG/ML  SOLN COMPARISON:  02/25/2018 FINDINGS: CT CHEST FINDINGS Cardiovascular: Coronary, aortic arch, and branch vessel atherosclerotic vascular disease. Mild ascending aortic ectasia at 3.6 cm. Mild cardiomegaly. Mediastinum/Nodes: Stable anterior mediastinal fluid density lesion with the upper component measuring 1.7 cm transverse, previously 1.8 cm transverse, probably a pericardial cyst. Contrast medium in the esophagus could reflect dysmotility. No pathologic adenopathy in the chest. Lungs/Pleura: Biapical pleuroparenchymal scarring. Sub solid 4 mm nodule in the right upper lobe on image 48/4, stable. 3 by 2 mm right middle lobe nodule on image 71/4, stable. Right lower lobe nodule measures 1.2 by 0.9 cm on image 99/4, formerly 0.8 by 0.6 cm by my measurement. Bandlike scarring or atelectasis along the left inferior pulmonary ligament. Musculoskeletal: Thoracic spondylosis. CT ABDOMEN PELVIS FINDINGS Hepatobiliary: Cholecystectomy.  Otherwise unremarkable. Pancreas: Unremarkable Spleen:  Unremarkable Adrenals/Urinary Tract: Unremarkable Stomach/Bowel: Gastric bypass. Vascular/Lymphatic: Aortoiliac atherosclerotic vascular disease. No pathologic adenopathy identified. Reproductive: Uterus absent. Interval salpingo oophorectomy with no residual lesion along the adnexa. Other: No ascites or definite nodularity along the omentum or mesentery to suggest peritoneal spread of disease at this time. Musculoskeletal: Left hip prosthesis and prior plate and screw acetabular repair. Evidence of remote right pubic ramus fractures. Grade 1 degenerative anterolisthesis at L4-5. IMPRESSION: 1. The right lower lobe pulmonary nodule along a bandlike density currently measures mildly larger than it did on 02/25/2018. This enlargement raises concern for malignancy. Proximity to the diaphragm could make percutaneous biopsy difficult. Consider PET-CT and/or referral to multidisciplinary cancer conference. 2. Interval bilateral salpingo oophorectomy, with no findings of malignancy in the abdomen/pelvis. 3. Stable fluid density lesion along the anterior mediastinum, most likely a pericardial cyst. 4. Other imaging findings of potential clinical significance: Aortic Atherosclerosis (ICD10-I70.0). Coronary atherosclerosis. Mild cardiomegaly. Two tiny stable nodules in the right lung. Electronically Signed   By: Cindra Eves.D.  On: 07/12/2018 14:12   Ct Abdomen Pelvis W Contrast  Result Date: 07/12/2018 CLINICAL DATA:  Ovarian malignancy restaging.  Lung nodule. EXAM: CT CHEST, ABDOMEN, AND PELVIS WITH CONTRAST TECHNIQUE: Multidetector CT imaging of the chest, abdomen and pelvis was performed following the standard protocol during bolus administration of intravenous contrast. CONTRAST:  138mL OMNIPAQUE IOHEXOL 300 MG/ML  SOLN COMPARISON:  02/25/2018 FINDINGS: CT CHEST FINDINGS Cardiovascular: Coronary, aortic arch, and branch vessel atherosclerotic vascular disease. Mild ascending aortic ectasia at 3.6 cm. Mild  cardiomegaly. Mediastinum/Nodes: Stable anterior mediastinal fluid density lesion with the upper component measuring 1.7 cm transverse, previously 1.8 cm transverse, probably a pericardial cyst. Contrast medium in the esophagus could reflect dysmotility. No pathologic adenopathy in the chest. Lungs/Pleura: Biapical pleuroparenchymal scarring. Sub solid 4 mm nodule in the right upper lobe on image 48/4, stable. 3 by 2 mm right middle lobe nodule on image 71/4, stable. Right lower lobe nodule measures 1.2 by 0.9 cm on image 99/4, formerly 0.8 by 0.6 cm by my measurement. Bandlike scarring or atelectasis along the left inferior pulmonary ligament. Musculoskeletal: Thoracic spondylosis. CT ABDOMEN PELVIS FINDINGS Hepatobiliary: Cholecystectomy.  Otherwise unremarkable. Pancreas: Unremarkable Spleen: Unremarkable Adrenals/Urinary Tract: Unremarkable Stomach/Bowel: Gastric bypass. Vascular/Lymphatic: Aortoiliac atherosclerotic vascular disease. No pathologic adenopathy identified. Reproductive: Uterus absent. Interval salpingo oophorectomy with no residual lesion along the adnexa. Other: No ascites or definite nodularity along the omentum or mesentery to suggest peritoneal spread of disease at this time. Musculoskeletal: Left hip prosthesis and prior plate and screw acetabular repair. Evidence of remote right pubic ramus fractures. Grade 1 degenerative anterolisthesis at L4-5. IMPRESSION: 1. The right lower lobe pulmonary nodule along a bandlike density currently measures mildly larger than it did on 02/25/2018. This enlargement raises concern for malignancy. Proximity to the diaphragm could make percutaneous biopsy difficult. Consider PET-CT and/or referral to multidisciplinary cancer conference. 2. Interval bilateral salpingo oophorectomy, with no findings of malignancy in the abdomen/pelvis. 3. Stable fluid density lesion along the anterior mediastinum, most likely a pericardial cyst. 4. Other imaging findings of  potential clinical significance: Aortic Atherosclerosis (ICD10-I70.0). Coronary atherosclerosis. Mild cardiomegaly. Two tiny stable nodules in the right lung. Electronically Signed   By: Van Clines M.D.   On: 07/12/2018 14:12   Nm Pet Image Initial (pi) Skull Base To Thigh  Result Date: 07/19/2018 CLINICAL DATA:  Initial treatment strategy for ovarian cancer. Enlarging right lower lobe pulmonary nodule. EXAM: NUCLEAR MEDICINE PET SKULL BASE TO THIGH TECHNIQUE: 9.75 mCi F-18 FDG was injected intravenously. Full-ring PET imaging was performed from the skull base to thigh after the radiotracer. CT data was obtained and used for attenuation correction and anatomic localization. Fasting blood glucose: 109 mg/dl COMPARISON:  CT scan 07/12/2018 and 02/27/2018 FINDINGS: Mediastinal blood pool activity: SUV max 2.69 NECK: No hypermetabolic lymph nodes in the neck. Incidental CT findings: Remote right occipital infarct is noted. CHEST: The right lower lobe pulmonary nodule is not hypermetabolic. SUV max is 1.7 which is below mediastinal background activity. No enlarged or hypermetabolic mediastinal or hilar lymph nodes. The anterior mediastinal nodule is stable and shows no hypermetabolism. No breast masses, supraclavicular or axillary adenopathy. Incidental CT findings: Stable extensive three-vessel and aortic atherosclerotic calcifications. ABDOMEN/PELVIS: No abnormal hypermetabolic activity within the liver, pancreas, adrenal glands, or spleen. No hypermetabolic lymph nodes in the abdomen or pelvis. Surgical changes noted in the left lower inguinal region with mild associated mild hypermetabolism. Incidental CT findings: Advanced atherosclerotic calcifications involving the aorta and iliac arteries. Surgical changes from gastric  bypass surgery. No complicating features. Status post cholecystectomy.  No biliary dilatation. SKELETON: No significant findings. A left total hip arthroplasty is noted. Incidental CT  findings: none IMPRESSION: 1. The right lower lobe pulmonary nodule is not hypermetabolic and likely benign. Recommend continued routine surveillance. 2. Anterior mediastinal nodule is not hypermetabolic and also likely benign. 3. No significant findings in the abdomen/pelvis. No omental or peritoneal surface lesions or adenopathy. Electronically Signed   By: Marijo Sanes M.D.   On: 07/19/2018 13:53     Assessment and plan- Patient is a 78 y.o. 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    She is here for toxicity check after 1 cycle of carbotaxol chemotherapy  Patient tolerated cycle 1 of carbotaxol chemotherapy well without any significant nausea or vomiting.  I will see her back in 10 days time with CBC and CMP prior to cycle 2.  Patient has chronic low back pain for which she was seeing orthopedics in the past and was getting narcotic pain medications through them.  She states that they are not going to be able to prescribe her narcotics anymore and I have asked her to speak to Dr. Edwina Barth regarding management of her back pain which seems to be affecting her quality of life.  Her blood pressure is better controlled today after discharge from the hospital.  Continue to monitor   Visit Diagnosis 1. Primary high grade serous adenocarcinoma of ovary ([place])      Dr. Randa Evens, MD, MPH [facility] [phone] 08/09/2018 12:39 PM

## 2018-08-10 ENCOUNTER — Encounter: Payer: Self-pay | Admitting: Oncology

## 2018-08-11 NOTE — Progress Notes (Unsigned)
Patient Services Navigator called patient on Tuesday, asking that she call to discuss her financial concerns. PSN has not received a return phone call back at this time.

## 2018-08-12 DIAGNOSIS — N83201 Unspecified ovarian cyst, right side: Secondary | ICD-10-CM | POA: Insufficient documentation

## 2018-08-16 ENCOUNTER — Encounter: Payer: Self-pay | Admitting: Physician Assistant

## 2018-08-16 ENCOUNTER — Ambulatory Visit: Payer: PPO | Admitting: Physician Assistant

## 2018-08-16 VITALS — BP 124/68 | HR 68 | Ht 68.0 in | Wt 168.4 lb

## 2018-08-16 DIAGNOSIS — E785 Hyperlipidemia, unspecified: Secondary | ICD-10-CM | POA: Diagnosis not present

## 2018-08-16 DIAGNOSIS — E119 Type 2 diabetes mellitus without complications: Secondary | ICD-10-CM

## 2018-08-16 DIAGNOSIS — I2510 Atherosclerotic heart disease of native coronary artery without angina pectoris: Secondary | ICD-10-CM | POA: Diagnosis not present

## 2018-08-16 DIAGNOSIS — J9859 Other diseases of mediastinum, not elsewhere classified: Secondary | ICD-10-CM | POA: Diagnosis not present

## 2018-08-16 DIAGNOSIS — I10 Essential (primary) hypertension: Secondary | ICD-10-CM | POA: Diagnosis not present

## 2018-08-16 DIAGNOSIS — I480 Paroxysmal atrial fibrillation: Secondary | ICD-10-CM

## 2018-08-16 DIAGNOSIS — N9489 Other specified conditions associated with female genital organs and menstrual cycle: Secondary | ICD-10-CM | POA: Diagnosis not present

## 2018-08-16 DIAGNOSIS — Z8673 Personal history of transient ischemic attack (TIA), and cerebral infarction without residual deficits: Secondary | ICD-10-CM | POA: Diagnosis not present

## 2018-08-16 NOTE — Progress Notes (Signed)
Cardiology Office Note    Date:  08/17/2018   ID:  Margaret [name], DOB [birth year], MRN [id number]  PCP:  Baxter Hire, MD  Cardiologist: Dr. Martinique  Chief Complaint  Patient presents with  . Follow-up    Dr. Martinique.     History of Present Illness:  Margaret [name] is a 78 y.o. female with PMH of CAD, DM, HTN, HLD, PAF, and history of stroke.  Patient had a abnormal nuclear stress test in June 2013 which led to a cardiac catheterization demonstrating 50 to 70% mid RCA lesion, 80% stenosis in distal RCA treated with 2.5 x 18 mm DES.  The mid RCA lesion was managed medically.  Myoview in October 2013 was normal.  Patient suffered right PCA stroke in May 2017 at [facility].  Plavix was added at the time.  She was seen in October 2017 for nonexertional chest discomfort.  Myoview showed no perfusion abnormality, EF 54%.  Holter monitor obtained in January 2018 showed frequent PACs, short run of PAT but no A. Fib.  Patient had a cardiac catheterization in December 2018 which revealed a thrombotic in-stent restenosis of RCA treated with extensive balloon angioplasty and overlapping DES.  Relook cath for chest pain later on the same day showed widely patent RCA side.  Echocardiogram prior to discharge also showed EF of 55 to 60%, mild LVH, grade 2 DD.  She was switched to Brilinta at the time.  She has significant bradycardia and was admitted to [facility] in July 2019.  Apparently she was taking both carvedilol and Lopressor at the same time.  During the same hospitalization, patient went into atrial fibrillation with RVR.  She was placed on Eliquis, and her aspirin was stopped.  Brilinta was later transitioned to Plavix.  Although there was some thoughts about starting her on amiodarone, however patient spontaneously converted back to sinus rhythm.  Repeat echocardiogram and Myoview were normal in 2019  She was diagnosed with iron deficiency anemia and heme-positive stool.  CT of chest  abdomen was done which found 4 cm right adnexal mass.  Follow-up ultrasound showed complex ovarian cyst.  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  Subsequent upper EGD showed mild esophagitis.  Colonoscopy showed 2 polyps.  She underwent BSO in January 2020 for complex ovarian cyst.  Pathology showed high-grade serous carcinoma.  PET scan showed no metastatic disease.  She was started on chemotherapy with carboplatin and paclitaxel.  Patient was last seen the by Dr. Martinique on 07/21/2018, her blood pressure is very high on that day, losartan 50 mg daily was added to her medical regimen.  She underwent a right IJ Port-A-Cath placement on 07/26/2018.  More recently, she was admitted to the hospital on 07/30/2018 with complaint of lower extremity pain after starting on chemotherapy.  She was also found to have severely elevated blood pressure with systolic readings greater than 200.  She was treated with IV labetalol and hydralazine.  Blood pressure improved.  Losartan was further increased.  She presents today for cardiology office visit.  Her lower extremity pain has significantly improved.  She denies any chest pain or shortness of breath.  Her blood pressure is very well controlled today.  Despite initiation of chemotherapy, she is tolerating the regimen quite well.     Past Medical History:  Diagnosis Date  . Acetabular fracture ([place])   . Anxiety and depression   . Bartholin cyst   . CAD (coronary  artery disease)   . Cancer ([place])    mouth cyst was cancer many years ago   . Cellulitis of right leg 2005  . CHF (congestive heart failure) ([place])   . Colon polyp   . Complication of anesthesia   . Diabetes mellitus without complication ([place])   . DJD (degenerative joint disease)   . Dyspnea on exertion   . Dysrhythmia    PAF  . GERD (gastroesophageal reflux disease)   . Hyperlipidemia   . Hypertension   . Morbid obesity ([place])   . Myocardial  infarction ([place]) 06/14/2017  . PAF (paroxysmal atrial fibrillation) ([place])    a. diagnosed 7/19; b. CHADS2VASc 8; c. Eliquis  . PONV (postoperative nausea and vomiting)   . Presence of stent in right coronary artery 02/08/12   2.5x18 Xience distal RCA  . Right shoulder pain   . Stroke ([place]) 2017  . Vaginal prolapse     Past Surgical History:  Procedure Laterality Date  . ABDOMINAL HYSTERECTOMY     supracervical abdominal w/removal tubes &/or ovaries  . BREAST BIOPSY  1989  . CHOLECYSTECTOMY    . COLONOSCOPY    . COLONOSCOPY WITH PROPOFOL N/A 06/21/2018   Procedure: COLONOSCOPY WITH PROPOFOL;  Surgeon: Lollie Sails, MD;  Location: [facility] ENDOSCOPY;  Service: Endoscopy;  Laterality: N/A;  . CORONARY STENT INTERVENTION N/A 06/14/2017   Procedure: CORONARY STENT INTERVENTION;  Surgeon: Wellington Hampshire, MD;  Location: [place] CV LAB;  Service: Cardiovascular;  Laterality: N/A;  . ESOPHAGOGASTRODUODENOSCOPY N/A 06/21/2018   Procedure: ESOPHAGOGASTRODUODENOSCOPY (EGD);  Surgeon: Lollie Sails, MD;  Location: [facility] ENDOSCOPY;  Service: Endoscopy;  Laterality: N/A;  . FACIAL COSMETIC SURGERY     face/neck lift  . FRACTURE SURGERY    . GASTRIC BYPASS  2008  . JOINT REPLACEMENT    . KNEE SURGERY     left knee arthroscopic  . LAPAROSCOPIC BILATERAL SALPINGO OOPHERECTOMY Bilateral 06/24/2018   Procedure: LAPAROSCOPIC BILATERAL SALPINGO OOPHORECTOMY;  Surgeon: Schermerhorn, Gwen Her, MD;  Location: [facility] ORS;  Service: Gynecology;  Laterality: Bilateral;  . LEFT HEART CATH AND CORONARY ANGIOGRAPHY N/A 06/14/2017   Procedure: LEFT HEART CATH AND CORONARY ANGIOGRAPHY;  Surgeon: Wellington Hampshire, MD;  Location: [place] CV LAB;  Service: Cardiovascular;  Laterality: N/A;  . LEFT HEART CATH AND CORONARY ANGIOGRAPHY N/A 06/14/2017   Procedure: LEFT HEART CATH AND CORONARY ANGIOGRAPHY;  Surgeon: Wellington Hampshire, MD;  Location: [place] CV LAB;  Service: Cardiovascular;   Laterality: N/A;  . LYSIS OF ADHESION  06/24/2018   Procedure: LYSIS OF ADHESION;  Surgeon: Ouida Sills Gwen Her, MD;  Location: [facility] ORS;  Service: Gynecology;;  . Crisoforo [place]   left hip fracture/repair and left arm fracture  . OTHER SURGICAL HISTORY  1986   hysterectomy  . PORTA CATH INSERTION N/A 07/26/2018   Procedure: PORTA CATH INSERTION;  Surgeon: Katha Cabal, MD;  Location: [place] CV LAB;  Service: Cardiovascular;  Laterality: N/A;  . right knee  late 90's   arthroscopic  . ROUX-EN-Y PROCEDURE    . SALIVARY GLAND SURGERY    . TOTAL HIP ARTHROPLASTY  2004   left    Current Medications: Outpatient Medications Prior to Visit  Medication Sig Dispense Refill  . amLODipine (NORVASC) 2.5 MG tablet Take 1 tablet (2.5 mg total) by mouth daily. 90 tablet 3  . apixaban (ELIQUIS) 5 MG TABS tablet Take 1 tablet (5 mg total) by mouth 2 (two) times daily. 60 tablet  0  . clopidogrel (PLAVIX) 75 MG tablet Take 1 tablet (75 mg total) by mouth daily. 30 tablet 0  . cyanocobalamin (,VITAMIN B-12,) 1000 MCG/ML injection Inject 1,000 mcg into the muscle every 30 (thirty) days.    . cyclobenzaprine (FLEXERIL) 10 MG tablet Take 10 mg by mouth 2 (two) times daily as needed for spasms or pain.    Marland Kitchen dexamethasone (DECADRON) 4 MG tablet Take 2 tablets (8 mg total) by mouth daily. Start the day after chemotherapy for 2 days. 30 tablet 1  . ergocalciferol (VITAMIN D2) 1.25 MG (50000 UT) capsule Take 50,000 Units by mouth every Monday.    . ezetimibe (ZETIA) 10 MG tablet TAKE 1 TABLET(10 MG) BY MOUTH DAILY (Patient taking differently: Take 10 mg by mouth daily. ) 30 tablet 5  . ferrous sulfate 325 (65 FE) MG tablet Take 325 mg by mouth daily with breakfast.    . HYDROcodone-acetaminophen (NORCO/VICODIN) 5-325 MG tablet Take 1 tablet by mouth at bedtime as needed for moderate pain. (Patient not taking: Reported on 08/08/2018) 30 tablet 0  . lidocaine-prilocaine (EMLA) cream Apply to affected area once  30 g 3  . LORazepam (ATIVAN) 0.5 MG tablet Take 1 tablet (0.5 mg total) by mouth every 6 (six) hours as needed (Nausea or vomiting). (Patient not taking: Reported on 08/08/2018) 30 tablet 0  . losartan (COZAAR) 100 MG tablet Take 1 tablet (100 mg total) by mouth daily. 30 tablet 1  . Multiple Vitamin (MULTIVITAMIN WITH MINERALS) TABS tablet Take 1 tablet by mouth daily. 30 tablet 1  . Multiple Vitamins-Minerals (OCUVITE PO) Take 1 capsule by mouth daily.    . nitroGLYCERIN (NITROSTAT) 0.4 MG SL tablet Place 1 tablet (0.4 mg total) under the tongue every 5 (five) minutes as needed for chest pain. (Patient not taking: Reported on 08/08/2018) 30 tablet 2  . ondansetron (ZOFRAN) 4 MG tablet Take 4 mg by mouth every 8 (eight) hours as needed for nausea or vomiting.    . prochlorperazine (COMPAZINE) 10 MG tablet Take 1 tablet (10 mg total) by mouth every 6 (six) hours as needed (Nausea or vomiting). (Patient not taking: Reported on 08/08/2018) 30 tablet 1  . protein supplement shake (PREMIER PROTEIN) LIQD Take 325 mLs (11 oz total) by mouth 2 (two) times daily between meals. 60 Can 0  . rosuvastatin (CRESTOR) 40 MG tablet Take 40 mg by mouth at bedtime.    . senna-docusate (SENNA S) 8.6-50 MG tablet Take 1 tablet by mouth daily.     . sertraline (ZOLOFT) 100 MG tablet Take 100 mg by mouth daily.     . Vitamin D, Ergocalciferol, (DRISDOL) 1.25 MG (50000 UT) CAPS capsule Take 50,000 Units by mouth every 7 (seven) days.      No facility-administered medications prior to visit.      Allergies:   Erythromycin base and Tape   Social History   Socioeconomic History  . Marital status: Married    Spouse name: Not on file  . Number of children: 1  . Years of education: Not on file  . Highest education level: Not on file  Occupational History  . Occupation: nurse-retired  Social Needs  . Financial resource strain: Not on file  . Food insecurity:    Worry: Not on file    Inability: Not on file  .  Transportation needs:    Medical: Not on file    Non-medical: Not on file  Tobacco Use  . Smoking status: Former Smoker  Packs/day: 2.00    Years: 26.00    Pack years: 52.00    Types: Cigarettes    Last attempt to quit: 06/22/1984    Years since quitting: 34.1  . Smokeless tobacco: Never Used  Substance and Sexual Activity  . Alcohol use: Yes    Alcohol/week: 0.0 standard drinks    Frequency: Never    Comment: rare- less than once a month  . Drug use: No  . Sexual activity: Yes    Birth control/protection: Surgical    Comment: hysterectomy  Lifestyle  . Physical activity:    Days per week: Not on file    Minutes per session: Not on file  . Stress: Not on file  Relationships  . Social connections:    Talks on phone: Not on file    Gets together: Not on file    Attends religious service: Not on file    Active member of club or organization: Not on file    Attends meetings of clubs or organizations: Not on file    Relationship status: Not on file  Other Topics Concern  . Not on file  Social History Narrative  . Not on file     Family History:  The patient's family history includes Cancer in her maternal grandfather; Colon cancer (age of onset: 17) in her mother; Diabetes in her brother, maternal grandmother, and sister; Heart attack in her father; Heart attack (age of onset: 24) in her brother and sister; Hypertension in her daughter and sister; Thyroid disease in her daughter.   ROS:   Please see the history of present illness.    ROS All other systems reviewed and are negative.   PHYSICAL EXAM:   VS:  BP 124/68 (BP Location: Left Arm)   Pulse 68   Ht 5\' 8"  (1.727 m)   Wt 168 lb 6.4 oz (76.4 kg)   SpO2 98%   BMI 25.61 kg/m    GEN: Well nourished, well developed, in no acute distress  HEENT: normal  Neck: no JVD, carotid bruits, or masses Cardiac: RRR; no murmurs, rubs, or gallops,no edema  Respiratory:  clear to auscultation bilaterally, normal work of  breathing GI: soft, nontender, nondistended, + BS MS: no deformity or atrophy  Skin: warm and dry, no rash Neuro:  Alert and Oriented x 3, Strength and sensation are intact Psych: euthymic mood, full affect  Wt Readings from Last 3 Encounters:  08/16/18 168 lb 6.4 oz (76.4 kg)  08/08/18 170 lb (77.1 kg)  08/01/18 175 lb 11.2 oz (79.7 kg)      Studies/Labs Reviewed:   EKG:  EKG is not ordered today.    Recent Labs: 01/12/2018: B Natriuretic Peptide 223.0 07/30/2018: Magnesium 2.2 07/31/2018: TSH 4.668 08/08/2018: ALT 17; BUN 18; Creatinine, Ser 1.04; Hemoglobin 13.3; Platelets 213; Potassium 3.4; Sodium 142   Lipid Panel    Component Value Date/Time   CHOL 223 (H) 10/18/2017 1205   TRIG 129 10/18/2017 1205   HDL 68 10/18/2017 1205   CHOLHDL 3.8 06/15/2017 0517   VLDL 19 06/15/2017 0517   LDLCALC 129 (H) 10/18/2017 1205    Additional studies/ records that were reviewed today include:   Cath 06/14/2017  Ost 1st Diag to 1st Diag lesion is 90% stenosed.  Prox LAD lesion is 70% stenosed.  Ost Cx to Prox Cx lesion is 40% stenosed.  Prox Cx to Mid Cx lesion is 60% stenosed.  Ost 2nd Mrg to 2nd Mrg lesion is 30% stenosed.  Previously  placed Mid RCA drug eluting stent and stent (unknown type) is widely patent.  Balloon angioplasty was performed.  Previously placed Dist RCA drug eluting stent is widely patent.  Balloon angioplasty was performed.  1st RPLB lesion is 90% stenosed.   1.  Widely patent RCA stents with no significant change since PCI early today. 2.  Mildly to severely elevated left ventricular end-diastolic pressure.  Recommendations: The patient is noted to be very hypertensive with evidence of volume overload.  This could be the culprit for her shortness of breath and chest pain.  I started her on nitroglycerin drip, placed an order for a Foley catheter and 1 dose of IV furosemide. The patient will be transferred to the intensive care unit for further  monitoring. Her mental status did not go back to baseline and she seemed to be confused.  Due to that, I ordered a stat CT head to rule out bleed given that she received anticoagulation.   Echo 01/28/2018 LV EF: 50% -   55% Study Conclusions  - Left ventricle: The cavity size was normal. There was mild   concentric hypertrophy. Systolic function was normal. The   estimated ejection fraction was in the range of 50% to 55%. Wall   motion was normal; there were no regional wall motion   abnormalities. Doppler parameters are consistent with abnormal   left ventricular relaxation (grade 1 diastolic dysfunction). - Aortic valve: There was trivial regurgitation. - Mitral valve: There was mild regurgitation. - Left atrium: The atrium was mildly dilated. - Pericardium, extracardiac: A trivial pericardial effusion was   identified posterior to the heart.  Impressions:  - NSR noted.   ASSESSMENT:    1. Coronary artery disease involving native coronary artery of native heart without angina pectoris   2. Essential hypertension   3. Hyperlipidemia LDL goal <70   4. PAF (paroxysmal atrial fibrillation) ([place])   5. Controlled type 2 diabetes mellitus without complication, without long-term current use of insulin ([place])   6. H/O: CVA (cerebrovascular accident)   7. Mediastinal mass   8. Adnexal mass      PLAN:  In order of problems listed above:  1. CAD: Denies any recent chest pain.  Plavix was recently held for Port-A-Cath placement, has since resumed.  Denies any chest discomfort.  2. Hypertension: Blood pressure stable  3. Hyperlipidemia: On Crestor and Zetia  4. PAF: On Eliquis 5 mg twice daily.  Currently maintaining sinus rhythm based on physical exam.  However since she is receiving chemotherapy, she has higher probability of having recurrent A. Fib  5. DM2: Managed by primary care provider  6. Adnexal mass: Underwent salpingo-oophorectomy.  Starting on  chemotherapy.  7. 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    Medication Adjustments/Labs and Tests Ordered: Current medicines are reviewed at length with the patient today.  Concerns regarding medicines are outlined above.  Medication changes, Labs and Tests ordered today are listed in the Patient Instructions below. Patient Instructions  Medication Instructions:  Your physician recommends that you continue on your current medications as directed. Please refer to the Current Medication list given to you today.  If you need a refill on your cardiac medications before your next appointment, please call your pharmacy.   Lab work:  NONE  If you have labs (blood work) drawn today and your tests are completely normal, you will receive your results only by: Marland Kitchen MyChart Message (if you have MyChart) OR . A paper copy in the mail If you  have any lab test that is abnormal or we need to change your treatment, we will call you to review the results.  Testing/Procedures:  NONE  Follow-Up: Your physician recommends that you keep your scheduled appointment with Dr. Martinique as planned.  Any Other Special Instructions Will Be Listed Below (If Applicable). Monitor Heart rate at home. If your resting Heart rate is above 100 BPM please give our office a call.      Hilbert Corrigan, [place]  08/17/2018 3:39 PM    [facility] [address], [place], [place]  [address] Phone: [phone]; Fax: [phone]

## 2018-08-16 NOTE — Patient Instructions (Signed)
Medication Instructions:  Your physician recommends that you continue on your current medications as directed. Please refer to the Current Medication list given to you today.  If you need a refill on your cardiac medications before your next appointment, please call your pharmacy.   Lab work:  NONE  If you have labs (blood work) drawn today and your tests are completely normal, you will receive your results only by: Marland Kitchen MyChart Message (if you have MyChart) OR . A paper copy in the mail If you have any lab test that is abnormal or we need to change your treatment, we will call you to review the results.  Testing/Procedures:  NONE  Follow-Up: Your physician recommends that you keep your scheduled appointment with Dr. Martinique as planned.  Any Other Special Instructions Will Be Listed Below (If Applicable). Monitor Heart rate at home. If your resting Heart rate is above 100 BPM please give our office a call.

## 2018-08-17 ENCOUNTER — Encounter: Payer: Self-pay | Admitting: Physician Assistant

## 2018-08-18 ENCOUNTER — Inpatient Hospital Stay: Payer: PPO

## 2018-08-18 ENCOUNTER — Inpatient Hospital Stay (HOSPITAL_BASED_OUTPATIENT_CLINIC_OR_DEPARTMENT_OTHER): Payer: PPO | Admitting: Oncology

## 2018-08-18 ENCOUNTER — Encounter: Payer: Self-pay | Admitting: Oncology

## 2018-08-18 VITALS — BP 130/76 | HR 81 | Temp 97.9°F | Wt 167.9 lb

## 2018-08-18 DIAGNOSIS — G8929 Other chronic pain: Secondary | ICD-10-CM | POA: Diagnosis not present

## 2018-08-18 DIAGNOSIS — E119 Type 2 diabetes mellitus without complications: Secondary | ICD-10-CM | POA: Diagnosis not present

## 2018-08-18 DIAGNOSIS — C569 Malignant neoplasm of unspecified ovary: Secondary | ICD-10-CM

## 2018-08-18 DIAGNOSIS — Z8673 Personal history of transient ischemic attack (TIA), and cerebral infarction without residual deficits: Secondary | ICD-10-CM | POA: Diagnosis not present

## 2018-08-18 DIAGNOSIS — Z9884 Bariatric surgery status: Secondary | ICD-10-CM

## 2018-08-18 DIAGNOSIS — I509 Heart failure, unspecified: Secondary | ICD-10-CM

## 2018-08-18 DIAGNOSIS — I2510 Atherosclerotic heart disease of native coronary artery without angina pectoris: Secondary | ICD-10-CM

## 2018-08-18 DIAGNOSIS — Z90722 Acquired absence of ovaries, bilateral: Secondary | ICD-10-CM

## 2018-08-18 DIAGNOSIS — Z90710 Acquired absence of both cervix and uterus: Secondary | ICD-10-CM

## 2018-08-18 DIAGNOSIS — I480 Paroxysmal atrial fibrillation: Secondary | ICD-10-CM

## 2018-08-18 DIAGNOSIS — C562 Malignant neoplasm of left ovary: Secondary | ICD-10-CM | POA: Diagnosis not present

## 2018-08-18 DIAGNOSIS — D509 Iron deficiency anemia, unspecified: Secondary | ICD-10-CM

## 2018-08-18 DIAGNOSIS — Z87891 Personal history of nicotine dependence: Secondary | ICD-10-CM

## 2018-08-18 DIAGNOSIS — F329 Major depressive disorder, single episode, unspecified: Secondary | ICD-10-CM

## 2018-08-18 DIAGNOSIS — Z5111 Encounter for antineoplastic chemotherapy: Secondary | ICD-10-CM | POA: Diagnosis not present

## 2018-08-18 DIAGNOSIS — Z9079 Acquired absence of other genital organ(s): Secondary | ICD-10-CM

## 2018-08-18 DIAGNOSIS — C561 Malignant neoplasm of right ovary: Secondary | ICD-10-CM

## 2018-08-18 DIAGNOSIS — I252 Old myocardial infarction: Secondary | ICD-10-CM

## 2018-08-18 DIAGNOSIS — I10 Essential (primary) hypertension: Secondary | ICD-10-CM | POA: Diagnosis not present

## 2018-08-18 LAB — COMPREHENSIVE METABOLIC PANEL
ALT: 13 U/L (ref 0–44)
AST: 22 U/L (ref 15–41)
Albumin: 3.9 g/dL (ref 3.5–5.0)
Alkaline Phosphatase: 103 U/L (ref 38–126)
Anion gap: 7 (ref 5–15)
BUN: 17 mg/dL (ref 8–23)
CHLORIDE: 114 mmol/L — AB (ref 98–111)
CO2: 21 mmol/L — ABNORMAL LOW (ref 22–32)
Calcium: 9.1 mg/dL (ref 8.9–10.3)
Creatinine, Ser: 0.92 mg/dL (ref 0.44–1.00)
GFR calc Af Amer: >60 mL/min (ref >60)
GFR calc non Af Amer: 59 mL/min — ABNORMAL LOW (ref >60)
Glucose, Bld: 170 mg/dL — ABNORMAL HIGH (ref 70–99)
Potassium: 3.5 mmol/L (ref 3.5–5.1)
Sodium: 142 mmol/L (ref 135–145)
Total Bilirubin: 0.8 mg/dL (ref 0.3–1.2)
Total Protein: 6.5 g/dL (ref 6.5–8.1)

## 2018-08-18 LAB — CBC WITH DIFFERENTIAL/PLATELET
Abs Immature Granulocytes: 0.04 10*3/uL (ref 0.00–0.07)
Basophils Absolute: 0.1 10*3/uL (ref 0.0–0.1)
Basophils Relative: 1 %
Eosinophils Absolute: 0 10*3/uL (ref 0.0–0.5)
Eosinophils Relative: 1 %
HCT: 39 % (ref 36.0–46.0)
Hemoglobin: 12.8 g/dL (ref 12.0–15.0)
Immature Granulocytes: 1 %
Lymphocytes Relative: 30 %
Lymphs Abs: 2.1 10*3/uL (ref 0.7–4.0)
MCH: 28.7 pg (ref 26.0–34.0)
MCHC: 32.8 g/dL (ref 30.0–36.0)
MCV: 87.4 fL (ref 80.0–100.0)
Monocytes Absolute: 0.5 10*3/uL (ref 0.1–1.0)
Monocytes Relative: 8 %
Neutro Abs: 4.2 10*3/uL (ref 1.7–7.7)
Neutrophils Relative %: 59 %
Platelets: 330 10*3/uL (ref 150–400)
RBC: 4.46 MIL/uL (ref 3.87–5.11)
RDW: 14.6 % (ref 11.5–15.5)
WBC: 6.9 10*3/uL (ref 4.0–10.5)
nRBC: 0 % (ref 0.0–0.2)

## 2018-08-18 MED ORDER — SODIUM CHLORIDE 0.9 % IV SOLN
20.0000 mg | Freq: Once | INTRAVENOUS | Status: AC
  Administered 2018-08-18: 20 mg via INTRAVENOUS
  Filled 2018-08-18: qty 2

## 2018-08-18 MED ORDER — PEGFILGRASTIM 6 MG/0.6ML ~~LOC~~ PSKT
6.0000 mg | PREFILLED_SYRINGE | Freq: Once | SUBCUTANEOUS | Status: AC
  Administered 2018-08-18: 6 mg via SUBCUTANEOUS
  Filled 2018-08-18: qty 0.6

## 2018-08-18 MED ORDER — DIPHENHYDRAMINE HCL 50 MG/ML IJ SOLN
50.0000 mg | Freq: Once | INTRAMUSCULAR | Status: AC
  Administered 2018-08-18: 50 mg via INTRAVENOUS
  Filled 2018-08-18: qty 1

## 2018-08-18 MED ORDER — SODIUM CHLORIDE 0.9 % IV SOLN
175.0000 mg/m2 | Freq: Once | INTRAVENOUS | Status: AC
  Administered 2018-08-18: 348 mg via INTRAVENOUS
  Filled 2018-08-18: qty 58

## 2018-08-18 MED ORDER — HEPARIN SOD (PORK) LOCK FLUSH 100 UNIT/ML IV SOLN
500.0000 [IU] | Freq: Once | INTRAVENOUS | Status: AC
  Administered 2018-08-18: 500 [IU] via INTRAVENOUS
  Filled 2018-08-18: qty 5

## 2018-08-18 MED ORDER — SODIUM CHLORIDE 0.9% FLUSH
10.0000 mL | Freq: Once | INTRAVENOUS | Status: AC
  Administered 2018-08-18: 10 mL via INTRAVENOUS
  Filled 2018-08-18: qty 10

## 2018-08-18 MED ORDER — SODIUM CHLORIDE 0.9 % IV SOLN
420.0000 mg | Freq: Once | INTRAVENOUS | Status: AC
  Administered 2018-08-18: 420 mg via INTRAVENOUS
  Filled 2018-08-18: qty 42

## 2018-08-18 MED ORDER — SODIUM CHLORIDE 0.9 % IV SOLN
Freq: Once | INTRAVENOUS | Status: AC
  Administered 2018-08-18: 10:00:00 via INTRAVENOUS
  Filled 2018-08-18: qty 250

## 2018-08-18 MED ORDER — PALONOSETRON HCL INJECTION 0.25 MG/5ML
0.2500 mg | Freq: Once | INTRAVENOUS | Status: AC
  Administered 2018-08-18: 0.25 mg via INTRAVENOUS
  Filled 2018-08-18: qty 5

## 2018-08-18 MED ORDER — FAMOTIDINE IN NACL 20-0.9 MG/50ML-% IV SOLN
20.0000 mg | Freq: Once | INTRAVENOUS | Status: AC
  Administered 2018-08-18: 20 mg via INTRAVENOUS
  Filled 2018-08-18: qty 50

## 2018-08-18 NOTE — Progress Notes (Signed)
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 She is on Eliquis and Plavix.   She was initially seen by GI for iron deficiency anemia and positive stool occult. Endoscopy did not reveal any source of bleeding or malignancy. Colonoscopy could not be completed as patient was retching during the procedure and plan was to reschedule colonoscopy under intubation. During that work-up she underwent a CT abdomen and which showed a 4.6 cm right ovarian mass which was initially presumed to be a cyst. Patient has had a total vaginal hysterectomy done about 20 years ago. She was seen by Dr. Ouida Sills and underwent laparoscopic bilateral salpingo-oophorectomy with lysis of adhesions on 06/24/2018. Pathology showed microscopic focus of high-grade serous carcinoma involving both the left and the right ovary  DIAGNOSIS:  A. OVARY AND FALLOPIAN TUBE, LEFT; SALPINGO-OOPHORECTOMY:  OVARY  - MICROSCOPIC FOCUS OF HIGH-GRADE SEROUS CARCINOMA, SEE COMMENT.  - SEROUS CYST ADENOFIBROMA.  - MUCINOUS CYSTADENOMA WITH FOCAL PROLIFERATIVE FEATURES.  - FOCI SUGGESTIVE OF ENDOMETRIOSIS.  - FALLOPIAN TUBE WITH BENIGN PARATUBAL CYST.   B. OVARY AND FALLOPIAN TUBE, RIGHT; SALPINGO-OOPHORECTOMY:  OVARY  - THREE MICROSCOPIC FOCI OF HIGH-GRADE SEROUS CARCINOMA, SEE COMMENT.    - OVARY WITH SEROUS CYST ADENOFIBROMA.  - ENDOMETRIOSIS.  - FIBROUS SEROSAL ADHESIONS.  - FALLOPIAN TUBE WITH BENIGN PARATUBAL CYSTS.    CT chest abdomen and pelvis on 07/12/2018 showed a right lower lobe pulmonary nodule mildly larger than September 2019 which raises the concern for malignancy. No other evidence of malignancy elsewhere. Patient then underwent a PET scan on 07/19/2018 which showed a right lower lobe lung nodule was not hypermetabolic with an SUV of 1.7 below mediastinal background activity and no enlarged hypermetabolic mediastinal or hilar lymph nodes. This was considered likely benign  Patient's case was discussed with GYN conference and patient saw Dr. Fransisca Connors as well and adjuvant chemotherapy with carboplatin and Taxol was recommended given high risk of recurrence. Baseline Ca1 25 was normal   Interval history-she reports that her back pain is improving at this time and she has been using as needed Tylenol for this.  Denies any nausea vomiting or tingling numbness in her extremities  ECOG PS- 1 Pain scale- 0 Opioid associated constipation- no  Review of systems- Review of Systems  Constitutional: Positive for malaise/fatigue. Negative for chills, fever and weight loss.  HENT: Negative for congestion, ear discharge and nosebleeds.   Eyes: Negative for blurred vision.  Respiratory: Negative for cough, hemoptysis, sputum production, shortness of breath and wheezing.   Cardiovascular: Negative for chest pain, palpitations, orthopnea and claudication.  Gastrointestinal: Negative for abdominal pain, blood in stool, constipation, diarrhea, heartburn, melena, nausea and vomiting.  Genitourinary: Negative for dysuria, flank pain, frequency, hematuria and urgency.  Musculoskeletal: Positive for back pain. Negative for joint pain and myalgias.  Skin: Negative for rash.  Neurological: Negative for dizziness, tingling, focal weakness, seizures, weakness and headaches.  Endo/Heme/Allergies: Does not bruise/bleed easily.  Psychiatric/Behavioral: Negative for depression and suicidal ideas. The patient does not have insomnia.       Allergies  Allergen Reactions  . Erythromycin Base Nausea And Vomiting  . Tape Itching, Rash and Other (See Comments)    Also "band-aids"     Past Medical History:  Diagnosis Date  . Acetabular fracture ([place])   . Anxiety and depression   . Bartholin cyst   . CAD (coronary artery disease)   . Cancer ([place])    mouth cyst was cancer many years ago   . Cellulitis of right leg 2005  . CHF (congestive heart failure) ([place])   . Colon polyp   . Complication of anesthesia   . Diabetes mellitus without complication ([place])   . DJD (degenerative joint disease)   . Dyspnea on exertion   . Dysrhythmia    PAF  . GERD (gastroesophageal reflux disease)   . Hyperlipidemia   . Hypertension   . Morbid obesity ([place])   . Myocardial infarction ([place]) 06/14/2017  . PAF (paroxysmal atrial fibrillation) ([place])    a. diagnosed 7/19; b. CHADS2VASc 8; c. Eliquis  . PONV (postoperative nausea and vomiting)   . Presence of stent in right coronary artery 02/08/12   2.5x18 Xience distal RCA  . Right shoulder pain   . Stroke ([place]) 2017  . Vaginal prolapse      Past Surgical History:  Procedure Laterality Date  . ABDOMINAL HYSTERECTOMY     supracervical abdominal w/removal tubes &/or ovaries  . BREAST BIOPSY  1989  . CHOLECYSTECTOMY    . COLONOSCOPY    . COLONOSCOPY WITH PROPOFOL N/A 06/21/2018   Procedure: COLONOSCOPY WITH PROPOFOL;  Surgeon: Lollie Sails, MD;  Location: [facility] ENDOSCOPY;  Service: Endoscopy;  Laterality: N/A;  . CORONARY STENT INTERVENTION N/A 06/14/2017   Procedure: CORONARY STENT INTERVENTION;  Surgeon: Wellington Hampshire, MD;  Location: [facility] CV LAB;  Service: Cardiovascular;  Laterality: N/A;  . ESOPHAGOGASTRODUODENOSCOPY N/A 06/21/2018   Procedure: ESOPHAGOGASTRODUODENOSCOPY (EGD);  Surgeon: Lollie Sails, MD;  Location: [facility] ENDOSCOPY;  Service: Endoscopy;  Laterality: N/A;  . FACIAL COSMETIC SURGERY     face/neck lift  . FRACTURE SURGERY    . GASTRIC BYPASS  2008  . JOINT REPLACEMENT    . KNEE SURGERY     left knee arthroscopic  . LAPAROSCOPIC BILATERAL SALPINGO OOPHERECTOMY Bilateral 06/24/2018   Procedure: LAPAROSCOPIC BILATERAL SALPINGO OOPHORECTOMY;  Surgeon: Schermerhorn, Gwen Her, MD;  Location: [facility] ORS;  Service: Gynecology;  Laterality: Bilateral;  . LEFT HEART CATH AND CORONARY ANGIOGRAPHY N/A 06/14/2017   Procedure: LEFT HEART CATH AND CORONARY ANGIOGRAPHY;  Surgeon: Wellington Hampshire, MD;  Location: [place] [facility] LAB;  Service: Cardiovascular;  Laterality: N/A;  . LEFT HEART CATH AND CORONARY ANGIOGRAPHY N/A 06/14/2017   Procedure: LEFT HEART CATH AND CORONARY ANGIOGRAPHY;  Surgeon: Wellington Hampshire, MD;  Location: [facility] LAB;  Service: Cardiovascular;  Laterality: N/A;  . LYSIS OF ADHESION  06/24/2018   Procedure: LYSIS OF ADHESION;  Surgeon: Ouida Sills Gwen Her, MD;  Location: [facility] ORS;  Service: Gynecology;;  . Crisoforo [place]   left hip fracture/repair and left arm fracture  . OTHER SURGICAL HISTORY  1986   hysterectomy  . PORTA CATH INSERTION N/A 07/26/2018   Procedure: PORTA CATH INSERTION;  Surgeon: Katha Cabal, MD;  Location: [facility];  Service: Cardiovascular;  Laterality: N/A;  . right knee  late 90's  arthroscopic  . ROUX-EN-Y PROCEDURE    . SALIVARY GLAND SURGERY    . TOTAL HIP ARTHROPLASTY  2004   left    Social History   Socioeconomic History  . Marital status: Married    Spouse name: Not on file  . Number of children: 1  . Years of education: Not on file  . Highest education level: Not on file  Occupational History  . Occupation: nurse-retired  Social Needs  . Financial resource strain: Not on file  . Food insecurity:    Worry: Not on file    Inability: Not on file  . Transportation needs:    Medical: Not on  file    Non-medical: Not on file  Tobacco Use  . Smoking status: Former Smoker    Packs/day: 2.00    Years: 26.00    Pack years: 52.00    Types: Cigarettes    Last attempt to quit: 06/22/1984    Years since quitting: 34.1  . Smokeless tobacco: Never Used  Substance and Sexual Activity  . Alcohol use: Yes    Alcohol/week: 0.0 standard drinks    Frequency: Never    Comment: rare- less than once a month  . Drug use: No  . Sexual activity: Yes    Birth control/protection: Surgical    Comment: hysterectomy  Lifestyle  . Physical activity:    Days per week: Not on file    Minutes per session: Not on file  . Stress: Not on file  Relationships  . Social connections:    Talks on phone: Not on file    Gets together: Not on file    Attends religious service: Not on file    Active member of club or organization: Not on file    Attends meetings of clubs or organizations: Not on file    Relationship status: Not on file  . Intimate partner violence:    Fear of current or ex partner: Not on file    Emotionally abused: Not on file    Physically abused: Not on file    Forced sexual activity: Not on file  Other Topics Concern  . Not on file  Social History Narrative  . Not on file    Family History  Problem Relation Age of Onset  . Heart attack Father        MI age 56 and 29  . Heart attack Sister 75       cabg; deceased 87  . Diabetes Sister   . Hypertension Sister   . Heart attack Brother 36       cabg; deceased 108s  . Diabetes Brother   . Colon cancer Mother 75       deceased 69  . Diabetes Maternal Grandmother   . Cancer Maternal Grandfather        unknown type; deceased  . Hypertension Daughter   . Thyroid disease Daughter   . Breast cancer Neg Hx      Current Outpatient Medications:  .  amLODipine (NORVASC) 2.5 MG tablet, Take 1 tablet (2.5 mg total) by mouth daily., Disp: 90 tablet, Rfl: 3 .  apixaban (ELIQUIS) 5 MG TABS tablet, Take 1 tablet (5 mg total) by  mouth 2 (two) times daily., Disp: 60 tablet, Rfl: 0 .  clopidogrel (PLAVIX) 75 MG tablet, Take 1 tablet (75 mg total) by mouth daily., Disp: 30 tablet, Rfl: 0 .  cyanocobalamin (,VITAMIN B-12,) 1000 MCG/ML injection, Inject 1,000 mcg into the muscle every 30 (thirty)  days., Disp: , Rfl:  .  cyclobenzaprine (FLEXERIL) 10 MG tablet, Take 10 mg by mouth 2 (two) times daily as needed for spasms or pain., Disp: , Rfl:  .  dexamethasone (DECADRON) 4 MG tablet, Take 2 tablets (8 mg total) by mouth daily. Start the day after chemotherapy for 2 days., Disp: 30 tablet, Rfl: 1 .  ergocalciferol (VITAMIN D2) 1.25 MG (50000 UT) capsule, Take 50,000 Units by mouth every Monday., Disp: , Rfl:  .  ezetimibe (ZETIA) 10 MG tablet, TAKE 1 TABLET(10 MG) BY MOUTH DAILY (Patient taking differently: Take 10 mg by mouth daily. ), Disp: 30 tablet, Rfl: 5 .  ferrous sulfate 325 (65 FE) MG tablet, Take 325 mg by mouth daily with breakfast., Disp: , Rfl:  .  HYDROcodone-acetaminophen (NORCO/VICODIN) 5-325 MG tablet, Take 1 tablet by mouth at bedtime as needed for moderate pain., Disp: 30 tablet, Rfl: 0 .  lidocaine-prilocaine (EMLA) cream, Apply to affected area once, Disp: 30 g, Rfl: 3 .  LORazepam (ATIVAN) 0.5 MG tablet, Take 1 tablet (0.5 mg total) by mouth every 6 (six) hours as needed (Nausea or vomiting)., Disp: 30 tablet, Rfl: 0 .  losartan (COZAAR) 100 MG tablet, Take 1 tablet (100 mg total) by mouth daily., Disp: 30 tablet, Rfl: 1 .  Multiple Vitamin (MULTIVITAMIN WITH MINERALS) TABS tablet, Take 1 tablet by mouth daily., Disp: 30 tablet, Rfl: 1 .  Multiple Vitamins-Minerals (OCUVITE PO), Take 1 capsule by mouth daily., Disp: , Rfl:  .  nitroGLYCERIN (NITROSTAT) 0.4 MG SL tablet, Place 1 tablet (0.4 mg total) under the tongue every 5 (five) minutes as needed for chest pain., Disp: 30 tablet, Rfl: 2 .  ondansetron (ZOFRAN) 4 MG tablet, Take 4 mg by mouth every 8 (eight) hours as needed for nausea or vomiting., Disp: ,  Rfl:  .  prochlorperazine (COMPAZINE) 10 MG tablet, Take 1 tablet (10 mg total) by mouth every 6 (six) hours as needed (Nausea or vomiting)., Disp: 30 tablet, Rfl: 1 .  protein supplement shake (PREMIER PROTEIN) LIQD, Take 325 mLs (11 oz total) by mouth 2 (two) times daily between meals., Disp: 60 Can, Rfl: 0 .  rosuvastatin (CRESTOR) 40 MG tablet, Take 40 mg by mouth at bedtime., Disp: , Rfl:  .  senna-docusate (SENNA S) 8.6-50 MG tablet, Take 1 tablet by mouth daily. , Disp: , Rfl:  .  sertraline (ZOLOFT) 100 MG tablet, Take 100 mg by mouth daily. , Disp: , Rfl:  .  Vitamin D, Ergocalciferol, (DRISDOL) 1.25 MG (50000 UT) CAPS capsule, Take 50,000 Units by mouth every 7 (seven) days. , Disp: , Rfl:  No current facility-administered medications for this visit.   Facility-Administered Medications Ordered in Other Visits:  .  CARBOplatin (PARAPLATIN) 420 mg in sodium chloride 0.9 % 250 mL chemo infusion, 420 mg, Intravenous, Once, Sindy Guadeloupe, MD .  heparin lock flush 100 unit/mL, 500 Units, Intravenous, Once, Sindy Guadeloupe, MD .  PACLitaxel (TAXOL) 348 mg in sodium chloride 0.9 % 500 mL chemo infusion (> 60m/m2), 175 mg/m2 (Treatment Plan Recorded), Intravenous, Once, RSindy Guadeloupe MD, Last Rate: 204 mL/hr at 08/18/18 1151 .  pegfilgrastim (NEULASTA ONPRO KIT) injection 6 mg, 6 mg, Subcutaneous, Once, RSindy Guadeloupe MD  Physical exam:  Vitals:   08/18/18 0908  BP: 130/76  Pulse: 81  Temp: 97.9 F (36.6 C)  TempSrc: Oral  SpO2: 100%  Weight: 167 lb 14.4 oz (76.2 kg)   Physical Exam Constitutional:  General: She is not in acute distress. HENT:     Head: Normocephalic and atraumatic.  Eyes:     Pupils: Pupils are equal, round, and reactive to light.  Neck:     Musculoskeletal: Normal range of motion.  Cardiovascular:     Rate and Rhythm: Normal rate and regular rhythm.     Heart sounds: Normal heart sounds.  Pulmonary:     Effort: Pulmonary effort is normal.     Breath  sounds: Normal breath sounds.  Abdominal:     General: Bowel sounds are normal.     Palpations: Abdomen is soft.  Skin:    General: Skin is warm and dry.  Neurological:     Mental Status: She is alert and oriented to person, place, and time.      CMP Latest Ref Rng & Units 08/18/2018  Glucose 70 - 99 mg/dL 170(H)  BUN 8 - 23 mg/dL 17  Creatinine 0.44 - 1.00 mg/dL 0.92  Sodium 135 - 145 mmol/L 142  Potassium 3.5 - 5.1 mmol/L 3.5  Chloride 98 - 111 mmol/L 114(H)  CO2 22 - 32 mmol/L 21(L)  Calcium 8.9 - 10.3 mg/dL 9.1  Total Protein 6.5 - 8.1 g/dL 6.5  Total Bilirubin 0.3 - 1.2 mg/dL 0.8  Alkaline Phos 38 - 126 U/L 103  AST 15 - 41 U/L 22  ALT 0 - 44 U/L 13   CBC Latest Ref Rng & Units 08/18/2018  WBC 4.0 - 10.5 K/uL 6.9  Hemoglobin 12.0 - 15.0 g/dL 12.8  Hematocrit 36.0 - 46.0 % 39.0  Platelets 150 - 400 K/uL 330     Assessment and plan- Patient is a 78 y.o. 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  She is here for on treatment assessment prior to cycle 2 of carbotaxol chemotherapy  Counts okay to proceed with cycle 2 of carbotaxol chemotherapy today with on pro-Neulasta support.  I will see her back in 3 weeks time with CBC and CMP for cycle 3  Overall she is tolerating chemotherapy well without any significant side effects.  She does have chronic nonmalignant back pain which is being followed by orthopedics and PCP   Visit Diagnosis 1. Primary high grade serous adenocarcinoma of ovary ([place])   2. Encounter for antineoplastic chemotherapy      Dr. Randa Evens, MD, MPH [facility] [phone] 08/18/2018 2:12 PM

## 2018-08-19 ENCOUNTER — Telehealth: Payer: Self-pay | Admitting: *Deleted

## 2018-08-24 DIAGNOSIS — M1712 Unilateral primary osteoarthritis, left knee: Secondary | ICD-10-CM | POA: Diagnosis not present

## 2018-08-25 ENCOUNTER — Telehealth: Payer: Self-pay | Admitting: *Deleted

## 2018-08-25 NOTE — Telephone Encounter (Signed)
Called patient and left her message on her voicemail that I did check with Christy statin with GYN make sure there was anything specific from a genetic standpoint that is why we had the appointment in July for the patient.  She says no just the genetic appointment that was done through [facility] teaching the patient and we already have results of free has talked to the patient about her results so I am canceling her genetic appointment that in appts for JUly

## 2018-09-02 DIAGNOSIS — E538 Deficiency of other specified B group vitamins: Secondary | ICD-10-CM | POA: Diagnosis not present

## 2018-09-08 ENCOUNTER — Encounter: Payer: Self-pay | Admitting: Oncology

## 2018-09-08 ENCOUNTER — Inpatient Hospital Stay: Payer: PPO

## 2018-09-08 ENCOUNTER — Other Ambulatory Visit: Payer: Self-pay

## 2018-09-08 ENCOUNTER — Inpatient Hospital Stay (HOSPITAL_BASED_OUTPATIENT_CLINIC_OR_DEPARTMENT_OTHER): Payer: PPO | Admitting: Oncology

## 2018-09-08 ENCOUNTER — Other Ambulatory Visit: Payer: Self-pay | Admitting: Oncology

## 2018-09-08 ENCOUNTER — Other Ambulatory Visit: Payer: Self-pay | Admitting: *Deleted

## 2018-09-08 ENCOUNTER — Inpatient Hospital Stay: Payer: PPO | Attending: Oncology

## 2018-09-08 VITALS — BP 174/77 | HR 70 | Resp 18

## 2018-09-08 VITALS — BP 129/101 | HR 73 | Temp 97.2°F | Resp 18 | Ht 68.0 in | Wt 166.9 lb

## 2018-09-08 DIAGNOSIS — C561 Malignant neoplasm of right ovary: Secondary | ICD-10-CM | POA: Insufficient documentation

## 2018-09-08 DIAGNOSIS — E876 Hypokalemia: Secondary | ICD-10-CM

## 2018-09-08 DIAGNOSIS — Z5111 Encounter for antineoplastic chemotherapy: Secondary | ICD-10-CM | POA: Insufficient documentation

## 2018-09-08 DIAGNOSIS — Z9884 Bariatric surgery status: Secondary | ICD-10-CM | POA: Diagnosis not present

## 2018-09-08 DIAGNOSIS — Z87891 Personal history of nicotine dependence: Secondary | ICD-10-CM | POA: Diagnosis not present

## 2018-09-08 DIAGNOSIS — C562 Malignant neoplasm of left ovary: Secondary | ICD-10-CM | POA: Insufficient documentation

## 2018-09-08 DIAGNOSIS — C569 Malignant neoplasm of unspecified ovary: Secondary | ICD-10-CM

## 2018-09-08 DIAGNOSIS — Z7901 Long term (current) use of anticoagulants: Secondary | ICD-10-CM

## 2018-09-08 DIAGNOSIS — R911 Solitary pulmonary nodule: Secondary | ICD-10-CM

## 2018-09-08 LAB — COMPREHENSIVE METABOLIC PANEL
ALT: 10 U/L (ref 0–44)
AST: 20 U/L (ref 15–41)
Albumin: 3.6 g/dL (ref 3.5–5.0)
Alkaline Phosphatase: 91 U/L (ref 38–126)
Anion gap: 7 (ref 5–15)
BUN: 11 mg/dL (ref 8–23)
CHLORIDE: 113 mmol/L — AB (ref 98–111)
CO2: 21 mmol/L — ABNORMAL LOW (ref 22–32)
Calcium: 8.9 mg/dL (ref 8.9–10.3)
Creatinine, Ser: 0.79 mg/dL (ref 0.44–1.00)
GFR calc Af Amer: >60 mL/min (ref >60)
Glucose, Bld: 140 mg/dL — ABNORMAL HIGH (ref 70–99)
Potassium: 2.9 mmol/L — ABNORMAL LOW (ref 3.5–5.1)
Sodium: 141 mmol/L (ref 135–145)
Total Bilirubin: 0.8 mg/dL (ref 0.3–1.2)
Total Protein: 6 g/dL — ABNORMAL LOW (ref 6.5–8.1)

## 2018-09-08 LAB — CBC WITH DIFFERENTIAL/PLATELET
Abs Immature Granulocytes: 0.04 10*3/uL (ref 0.00–0.07)
Basophils Absolute: 0.1 10*3/uL (ref 0.0–0.1)
Basophils Relative: 1 %
Eosinophils Absolute: 0 10*3/uL (ref 0.0–0.5)
Eosinophils Relative: 1 %
HCT: 32.4 % — ABNORMAL LOW (ref 36.0–46.0)
Hemoglobin: 10.9 g/dL — ABNORMAL LOW (ref 12.0–15.0)
Immature Granulocytes: 1 %
LYMPHS PCT: 37 %
Lymphs Abs: 1.9 10*3/uL (ref 0.7–4.0)
MCH: 29.8 pg (ref 26.0–34.0)
MCHC: 33.6 g/dL (ref 30.0–36.0)
MCV: 88.5 fL (ref 80.0–100.0)
Monocytes Absolute: 0.4 10*3/uL (ref 0.1–1.0)
Monocytes Relative: 9 %
Neutro Abs: 2.6 10*3/uL (ref 1.7–7.7)
Neutrophils Relative %: 51 %
Platelets: 203 10*3/uL (ref 150–400)
RBC: 3.66 MIL/uL — ABNORMAL LOW (ref 3.87–5.11)
RDW: 15.6 % — ABNORMAL HIGH (ref 11.5–15.5)
WBC: 5.1 10*3/uL (ref 4.0–10.5)
nRBC: 0 % (ref 0.0–0.2)

## 2018-09-08 MED ORDER — SODIUM CHLORIDE 0.9 % IV SOLN
Freq: Once | INTRAVENOUS | Status: AC
  Administered 2018-09-08: 11:00:00 via INTRAVENOUS
  Filled 2018-09-08: qty 250

## 2018-09-08 MED ORDER — PEGFILGRASTIM 6 MG/0.6ML ~~LOC~~ PSKT
6.0000 mg | PREFILLED_SYRINGE | Freq: Once | SUBCUTANEOUS | Status: AC
  Administered 2018-09-08: 6 mg via SUBCUTANEOUS
  Filled 2018-09-08: qty 0.6

## 2018-09-08 MED ORDER — POTASSIUM CHLORIDE CRYS ER 20 MEQ PO TBCR: 20.0000 meq | EXTENDED_RELEASE_TABLET | Freq: Every day | ORAL | 0 refills | Status: DC

## 2018-09-08 MED ORDER — SODIUM CHLORIDE 0.9 % IV SOLN
421.0000 mg | Freq: Once | INTRAVENOUS | Status: AC
  Administered 2018-09-08: 420 mg via INTRAVENOUS
  Filled 2018-09-08: qty 42

## 2018-09-08 MED ORDER — SODIUM CHLORIDE 0.9 % IV SOLN
Freq: Once | INTRAVENOUS | Status: AC
  Administered 2018-09-08: 10:00:00 via INTRAVENOUS
  Filled 2018-09-08: qty 250

## 2018-09-08 MED ORDER — SODIUM CHLORIDE 0.9% FLUSH
10.0000 mL | INTRAVENOUS | Status: DC | PRN
  Administered 2018-09-08: 10 mL via INTRAVENOUS
  Filled 2018-09-08: qty 10

## 2018-09-08 MED ORDER — PALONOSETRON HCL INJECTION 0.25 MG/5ML
0.2500 mg | Freq: Once | INTRAVENOUS | Status: AC
  Administered 2018-09-08: 0.25 mg via INTRAVENOUS
  Filled 2018-09-08: qty 5

## 2018-09-08 MED ORDER — FAMOTIDINE IN NACL 20-0.9 MG/50ML-% IV SOLN
20.0000 mg | Freq: Once | INTRAVENOUS | Status: AC
  Administered 2018-09-08: 20 mg via INTRAVENOUS
  Filled 2018-09-08: qty 50

## 2018-09-08 MED ORDER — SODIUM CHLORIDE 0.9 % IV SOLN
20.0000 mg | Freq: Once | INTRAVENOUS | Status: AC
  Administered 2018-09-08: 20 mg via INTRAVENOUS
  Filled 2018-09-08: qty 2

## 2018-09-08 MED ORDER — DIPHENHYDRAMINE HCL 50 MG/ML IJ SOLN
50.0000 mg | Freq: Once | INTRAMUSCULAR | Status: AC
  Administered 2018-09-08: 50 mg via INTRAVENOUS
  Filled 2018-09-08: qty 1

## 2018-09-08 MED ORDER — SODIUM CHLORIDE 0.9 % IV SOLN
175.0000 mg/m2 | Freq: Once | INTRAVENOUS | Status: AC
  Administered 2018-09-08: 348 mg via INTRAVENOUS
  Filled 2018-09-08: qty 58

## 2018-09-08 MED ORDER — HEPARIN SOD (PORK) LOCK FLUSH 100 UNIT/ML IV SOLN
500.0000 [IU] | Freq: Once | INTRAVENOUS | Status: AC
  Administered 2018-09-08: 500 [IU] via INTRAVENOUS

## 2018-09-08 MED ORDER — HEPARIN SOD (PORK) LOCK FLUSH 100 UNIT/ML IV SOLN
500.0000 [IU] | Freq: Once | INTRAVENOUS | Status: AC | PRN
  Administered 2018-09-08: 500 [IU]

## 2018-09-08 NOTE — Progress Notes (Signed)
kdue

## 2018-09-08 NOTE — Telephone Encounter (Signed)
...     Ref Range & Units 08:44 (09/08/18) 3wk ago (08/18/18) 68mo ago (08/08/18) 17mo ago (07/30/18) 83mo ago (07/28/18) 23mo ago (06/24/18) 49mo ago (06/20/18)  Potassium 3.5 - 5.1 mmol/L 2.9Low   3.5  3.4Low   3.6

## 2018-09-08 NOTE — Progress Notes (Signed)
Her legs sometimes sore. Her b/p is up today

## 2018-09-10 NOTE — Progress Notes (Signed)
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 She is on Eliquis and Plavix.   She was initially seen by GI for iron deficiency anemia and positive stool occult. Endoscopy did not reveal any source of bleeding or malignancy. Colonoscopy could not be completed as patient was retching during the procedure and plan was to reschedule colonoscopy under intubation. During that work-up she underwent a CT abdomen and which showed a 4.6 cm right ovarian mass which was initially presumed to be a cyst. Patient has had a total vaginal hysterectomy done about 20 years ago. She was seen by Dr. Ouida Sills and underwent laparoscopic bilateral salpingo-oophorectomy with lysis of adhesions on 06/24/2018. Pathology showed microscopic focus of high-grade serous carcinoma involving both the left and the right ovary  DIAGNOSIS:  A. OVARY AND FALLOPIAN TUBE, LEFT; SALPINGO-OOPHORECTOMY:  OVARY  - MICROSCOPIC FOCUS OF HIGH-GRADE SEROUS CARCINOMA, SEE COMMENT.  - SEROUS CYST ADENOFIBROMA.  - MUCINOUS CYSTADENOMA WITH FOCAL PROLIFERATIVE FEATURES.  - FOCI SUGGESTIVE OF ENDOMETRIOSIS.  - FALLOPIAN TUBE WITH BENIGN PARATUBAL CYST.   B. OVARY AND FALLOPIAN TUBE, RIGHT; SALPINGO-OOPHORECTOMY:  OVARY  - THREE MICROSCOPIC FOCI OF HIGH-GRADE SEROUS CARCINOMA, SEE COMMENT.   - OVARY WITH SEROUS CYST ADENOFIBROMA.  - ENDOMETRIOSIS.  - FIBROUS SEROSAL ADHESIONS.  - FALLOPIAN TUBE WITH BENIGN PARATUBAL CYSTS.    CT chest abdomen and pelvis on 07/12/2018 showed a right lower lobe pulmonary nodule mildly larger than September 2019 which raises the concern for malignancy. No other evidence of malignancy elsewhere. Patient then underwent a PET scan on 07/19/2018 which showed a right lower lobe lung nodule was not hypermetabolic with an SUV of 1.7 below mediastinal background activity and no enlarged hypermetabolic mediastinal or hilar lymph nodes. This was considered likely benign  Patient's case was discussed with GYN conference and patient saw Dr. Fransisca Connors as well and adjuvant chemotherapy with carboplatin and Taxol was recommended given high risk of recurrence. Baseline Ca1 25 was normal   Interval history-reports occasional pain and soreness in her legs which comes and goes.  Back pain is well controlled.  Denies any nausea vomiting or diarrhea she denies any significant tingling numbness in her extremities  ECOG PS- 1 Pain scale- 0   Review of systems- Review of Systems  Constitutional: Positive for malaise/fatigue. Negative for chills, fever and weight loss.  HENT: Negative for congestion, ear discharge and nosebleeds.   Eyes: Negative for blurred vision.  Respiratory: Negative for cough, hemoptysis, sputum production, shortness of breath and wheezing.   Cardiovascular: Negative for chest pain, palpitations, orthopnea and claudication.  Gastrointestinal: Negative for abdominal pain, blood in stool, constipation, diarrhea, heartburn, melena, nausea and vomiting.  Genitourinary: Negative for dysuria, flank pain, frequency, hematuria and urgency.  Musculoskeletal: Positive for myalgias. Negative for back pain and joint pain.  Skin: Negative for rash.  Neurological: Negative for dizziness, tingling, focal weakness, seizures, weakness and headaches.   Endo/Heme/Allergies: Does  not bruise/bleed easily.  Psychiatric/Behavioral: Negative for depression and suicidal ideas. The patient does not have insomnia.      Allergies  Allergen Reactions  . Erythromycin Base Nausea And Vomiting  . Tape Itching, Rash and Other (See Comments)    Also "band-aids"     Past Medical History:  Diagnosis Date  . Acetabular fracture ([place])   . Anxiety and depression   . Bartholin cyst   . CAD (coronary artery disease)   . Cancer ([place])    mouth cyst was cancer many years ago   . Cellulitis of right leg 2005  . CHF (congestive heart failure) ([place])   . Colon polyp   . Complication of anesthesia   . Diabetes mellitus without complication ([place])   . DJD (degenerative joint disease)   . Dyspnea on exertion   . Dysrhythmia    PAF  . GERD (gastroesophageal reflux disease)   . Hyperlipidemia   . Hypertension   . Morbid obesity ([place])   . Myocardial infarction ([place]) 06/14/2017  . Ovarian cancer ([place])   . PAF (paroxysmal atrial fibrillation) ([place])    a. diagnosed 7/19; b. CHADS2VASc 8; c. Eliquis  . PONV (postoperative nausea and vomiting)   . Presence of stent in right coronary artery 02/08/12   2.5x18 Xience distal RCA  . Right shoulder pain   . Stroke ([place]) 2017  . Vaginal prolapse      Past Surgical History:  Procedure Laterality Date  . ABDOMINAL HYSTERECTOMY     supracervical abdominal w/removal tubes &/or ovaries  . BREAST BIOPSY  1989  . CHOLECYSTECTOMY    . COLONOSCOPY    . COLONOSCOPY WITH PROPOFOL N/A 06/21/2018   Procedure: COLONOSCOPY WITH PROPOFOL;  Surgeon: Lollie Sails, MD;  Location[facility] END[facility]COPY;  Service: Endoscopy;  Laterality: N/A;  . CORONARY STENT INTERVENTION N/A 06/14/2017   Procedure: CORONARY STENT INTERVENTION;  Surgeon: Wellington Hampshire, MD;  Location: [place] CV LAB;  Service: Cardiovascular;  Laterality: N/A;  . ESOPHAGOGASTRODUODENOSCOPY N/A 06/21/2018   Procedure: ESOPHAGOGASTRODUODENOSCOPY (EGD);   Surgeon: Lollie Sails, MD;  Location: [facility] ENDOSCOPY;  Service: Endoscopy;  Laterality: N/A;  . FACIAL COSMETIC SURGERY     face/neck lift  . FRACTURE SURGERY    . GASTRIC BYPASS  2008  . JOINT REPLACEMENT    . KNEE SURGERY     left knee arthroscopic  . LAPAROSCOPIC BILATERAL SALPINGO OOPHERECTOMY Bilateral 06/24/2018   Procedure: LAPAROSCOPIC BILATERAL SALPINGO OOPHORECTOMY;  Surgeon: Schermerhorn, Gwen Her, MD;  Location: [facility] ORS;  Service: Gynecology;  Laterality: Bilateral;  . LEFT HEART CATH AND CORONARY ANGIOGRAPHY N/A 06/14/2017   Procedure: LEFT HEART CATH AND CORONARY ANGIOGRAPHY;  Surgeon: Wellington Hampshire, MD;  Location: [place] CV LAB;  Service: Cardiovascular;  Laterality: N/A;  . LEFT HEART CATH AND CORONARY ANGIOGRAPHY N/A 06/14/2017   Procedure: LEFT HEART CATH AND CORONARY ANGIOGRAPHY;  Surgeon: Wellington Hampshire, MD;  Location: [place] CV LAB;  Service: Cardiovascular;  Laterality: N/A;  . LYSIS OF ADHESION  06/24/2018   Procedure: LYSIS OF ADHESION;  Surgeon: Ouida Sills Gwen Her, MD;  Location: [facility] ORS;  Service: Gynecology;;  . Crisoforo [place]   left hip fracture/repair and left arm fracture  . OTHER SURGICAL HISTORY  1986   hysterectomy  . PORTA CATH INSERTION N/A 07/26/2018   Procedure: PORTA CATH INSERTION;  Surgeon: Katha Cabal, MD;  Location: [place] CV LAB;  Service: Cardiovascular;  Laterality: N/A;  . right knee  late 90's   arthroscopic  . ROUX-EN-Y PROCEDURE    . SALIVARY GLAND SURGERY    . TOTAL HIP ARTHROPLASTY  2004   left    Social History   Socioeconomic History  . Marital status: Married    Spouse name: Not on file  . Number of children: 1  . Years of education: Not on file  . Highest education level: Not on file  Occupational History  . Occupation: nurse-retired  Social Needs  . Financial resource strain: Not on file  . Food insecurity:    Worry: Not on file    Inability: Not on file  . Transportation needs:     Medical: Not on file    Non-medical: Not on file  Tobacco Use  . Smoking status: Former Smoker    Packs/day: 2.00    Years: 26.00    Pack years: 52.00    Types: Cigarettes    Last attempt to quit: 06/22/1984    Years since quitting: 34.2  . Smokeless tobacco: Never Used  Substance and Sexual Activity  . Alcohol use: Yes    Alcohol/week: 0.0 standard drinks    Frequency: Never    Comment: rare- less than once a month  . Drug use: No  . Sexual activity: Yes    Birth control/protection: Surgical    Comment: hysterectomy  Lifestyle  . Physical activity:    Days per week: Not on file    Minutes per session: Not on file  . Stress: Not on file  Relationships  . Social connections:    Talks on phone: Not on file    Gets together: Not on file    Attends religious service: Not on file    Active member of club or organization: Not on file    Attends meetings of clubs or organizations: Not on file    Relationship status: Not on file  . Intimate partner violence:    Fear of current or ex partner: Not on file    Emotionally abused: Not on file    Physically abused: Not on file    Forced sexual activity: Not on file  Other Topics Concern  . Not on file  Social History Narrative  . Not on file    Family History  Problem Relation Age of Onset  . Heart attack Father        MI age 9 and 18  . Heart attack Sister 42       cabg; deceased 85  . Diabetes Sister   . Hypertension Sister   . Heart attack Brother 62       cabg; deceased 8s  . Diabetes Brother   . Colon cancer Mother 25       deceased 95  . Diabetes Maternal Grandmother   . Cancer Maternal Grandfather        unknown type; deceased  . Hypertension Daughter   . Thyroid disease Daughter   . Breast cancer Neg Hx      Current Outpatient Medications:  .  amLODipine (NORVASC) 2.5 MG tablet, Take 1 tablet (2.5 mg total) by mouth daily., Disp: 90 tablet, Rfl: 3 .  apixaban (ELIQUIS) 5 MG TABS tablet, Take 1 tablet  (5 mg total) by mouth 2 (two) times daily., Disp: 60 tablet, Rfl: 0 .  clopidogrel (PLAVIX) 75 MG tablet, Take 1 tablet (75 mg total) by mouth daily., Disp: 30 tablet, Rfl: 0 .  cyanocobalamin (,VITAMIN B-12,) 1000 MCG/ML injection, Inject 1,000 mcg into the  muscle every 30 (thirty) days., Disp: , Rfl:  .  cyclobenzaprine (FLEXERIL) 10 MG tablet, Take 10 mg by mouth 2 (two) times daily as needed for spasms or pain., Disp: , Rfl:  .  dexamethasone (DECADRON) 4 MG tablet, Take 2 tablets (8 mg total) by mouth daily. Start the day after chemotherapy for 2 days., Disp: 30 tablet, Rfl: 1 .  ergocalciferol (VITAMIN D2) 1.25 MG (50000 UT) capsule, Take 50,000 Units by mouth every Monday., Disp: , Rfl:  .  ezetimibe (ZETIA) 10 MG tablet, TAKE 1 TABLET(10 MG) BY MOUTH DAILY (Patient taking differently: Take 10 mg by mouth daily. ), Disp: 30 tablet, Rfl: 5 .  ferrous sulfate 325 (65 FE) MG tablet, Take 325 mg by mouth daily with breakfast., Disp: , Rfl:  .  HYDROcodone-acetaminophen (NORCO/VICODIN) 5-325 MG tablet, Take 1 tablet by mouth at bedtime as needed for moderate pain., Disp: 30 tablet, Rfl: 0 .  lidocaine-prilocaine (EMLA) cream, Apply to affected area once, Disp: 30 g, Rfl: 3 .  LORazepam (ATIVAN) 0.5 MG tablet, Take 1 tablet (0.5 mg total) by mouth every 6 (six) hours as needed (Nausea or vomiting)., Disp: 30 tablet, Rfl: 0 .  losartan (COZAAR) 100 MG tablet, Take 1 tablet (100 mg total) by mouth daily., Disp: 30 tablet, Rfl: 1 .  Multiple Vitamin (MULTIVITAMIN WITH MINERALS) TABS tablet, Take 1 tablet by mouth daily., Disp: 30 tablet, Rfl: 1 .  Multiple Vitamins-Minerals (OCUVITE PO), Take 1 capsule by mouth daily., Disp: , Rfl:  .  nitroGLYCERIN (NITROSTAT) 0.4 MG SL tablet, Place 1 tablet (0.4 mg total) under the tongue every 5 (five) minutes as needed for chest pain., Disp: 30 tablet, Rfl: 2 .  ondansetron (ZOFRAN) 4 MG tablet, Take 4 mg by mouth every 8 (eight) hours as needed for nausea or  vomiting., Disp: , Rfl:  .  prochlorperazine (COMPAZINE) 10 MG tablet, Take 1 tablet (10 mg total) by mouth every 6 (six) hours as needed (Nausea or vomiting)., Disp: 30 tablet, Rfl: 1 .  protein supplement shake (PREMIER PROTEIN) LIQD, Take 325 mLs (11 oz total) by mouth 2 (two) times daily between meals., Disp: 60 Can, Rfl: 0 .  rosuvastatin (CRESTOR) 40 MG tablet, Take 40 mg by mouth at bedtime., Disp: , Rfl:  .  senna-docusate (SENNA S) 8.6-50 MG tablet, Take 1 tablet by mouth daily. , Disp: , Rfl:  .  sertraline (ZOLOFT) 100 MG tablet, Take 100 mg by mouth daily. , Disp: , Rfl:  .  potassium chloride SA (K-DUR,KLOR-CON) 20 MEQ tablet, TAKE 1 TABLET(20 MEQ) BY MOUTH DAILY, Disp: 25 tablet, Rfl: 0 .  Vitamin D, Ergocalciferol, (DRISDOL) 1.25 MG (50000 UT) CAPS capsule, Take 50,000 Units by mouth every 7 (seven) days. , Disp: , Rfl:   Physical exam:  Vitals:   09/08/18 0907  BP: (!) 129/101  Pulse: 73  Resp: 18  Temp: (!) 97.2 F (36.2 C)  TempSrc: Tympanic  Weight: 166 lb 14.4 oz (75.7 kg)  Height: 5\' 8"  (1.727 m)   Physical Exam Constitutional:      General: She is not in acute distress. HENT:     Head: Normocephalic and atraumatic.  Eyes:     Pupils: Pupils are equal, round, and reactive to light.  Neck:     Musculoskeletal: Normal range of motion.  Cardiovascular:     Rate and Rhythm: Normal rate and regular rhythm.     Heart sounds: Normal heart sounds.  Pulmonary:  Effort: Pulmonary effort is normal.     Breath sounds: Normal breath sounds.  Abdominal:     General: Bowel sounds are normal.     Palpations: Abdomen is soft.  Musculoskeletal:     Right lower leg: No edema.     Left lower leg: No edema.  Skin:    General: Skin is warm and dry.  Neurological:     Mental Status: She is alert and oriented to person, place, and time.      CMP Latest Ref Rng & Units 09/08/2018  Glucose 70 - 99 mg/dL 140(H)  BUN 8 - 23 mg/dL 11  Creatinine 0.44 - 1.00 mg/dL 0.79   Sodium 135 - 145 mmol/L 141  Potassium 3.5 - 5.1 mmol/L 2.9(L)  Chloride 98 - 111 mmol/L 113(H)  CO2 22 - 32 mmol/L 21(L)  Calcium 8.9 - 10.3 mg/dL 8.9  Total Protein 6.5 - 8.1 g/dL 6.0(L)  Total Bilirubin 0.3 - 1.2 mg/dL 0.8  Alkaline Phos 38 - 126 U/L 91  AST 15 - 41 U/L 20  ALT 0 - 44 U/L 10   CBC Latest Ref Rng & Units 09/08/2018  WBC 4.0 - 10.5 K/uL 5.1  Hemoglobin 12.0 - 15.0 g/dL 10.9(L)  Hematocrit 36.0 - 46.0 % 32.4(L)  Platelets 150 - 400 K/uL 203      Assessment and plan- Patient is a 78 y.o. 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    She is here for on treatment assessment prior to cycle 3 of carbotaxol chemotherapy  Counts okay to proceed with cycle 3 of carbotaxol chemotherapy today with on pro-Neulasta support.  I will see her back in 3 weeks time with CBC and CMP for cycle 4.  Hypokalemia: Her potassium levels are significantly low at 2.9 today.  We will give her 40 mEq of IV potassium today and she will start taking oral potassium 20 mEq daily for the next 3 weeks.   Visit Diagnosis 1. Encounter for antineoplastic chemotherapy   2. Primary high grade serous adenocarcinoma of ovary ([place])   3. Hypokalemia      Dr. Randa Evens, MD, MPH [facility] at [facility] [phone] 09/10/2018 8:08 PM

## 2018-09-19 ENCOUNTER — Ambulatory Visit: Admit: 2018-09-19 | Payer: PPO | Admitting: Gastroenterology

## 2018-09-19 SURGERY — COLONOSCOPY WITH PROPOFOL
Anesthesia: General

## 2018-09-27 ENCOUNTER — Other Ambulatory Visit: Payer: Self-pay | Admitting: *Deleted

## 2018-09-27 DIAGNOSIS — C569 Malignant neoplasm of unspecified ovary: Secondary | ICD-10-CM

## 2018-09-28 ENCOUNTER — Other Ambulatory Visit: Payer: Self-pay

## 2018-09-29 ENCOUNTER — Encounter: Payer: Self-pay | Admitting: Oncology

## 2018-09-29 ENCOUNTER — Other Ambulatory Visit: Payer: Self-pay

## 2018-09-29 ENCOUNTER — Other Ambulatory Visit: Payer: Self-pay | Admitting: *Deleted

## 2018-09-29 ENCOUNTER — Inpatient Hospital Stay (HOSPITAL_BASED_OUTPATIENT_CLINIC_OR_DEPARTMENT_OTHER): Payer: PPO | Admitting: Oncology

## 2018-09-29 ENCOUNTER — Inpatient Hospital Stay: Payer: PPO

## 2018-09-29 ENCOUNTER — Inpatient Hospital Stay: Payer: PPO | Attending: Oncology | Admitting: *Deleted

## 2018-09-29 VITALS — BP 108/73 | HR 58 | Temp 96.8°F | Resp 18 | Ht 68.0 in | Wt 152.2 lb

## 2018-09-29 DIAGNOSIS — C569 Malignant neoplasm of unspecified ovary: Secondary | ICD-10-CM

## 2018-09-29 DIAGNOSIS — Z95828 Presence of other vascular implants and grafts: Secondary | ICD-10-CM

## 2018-09-29 DIAGNOSIS — Z9884 Bariatric surgery status: Secondary | ICD-10-CM

## 2018-09-29 DIAGNOSIS — Z5111 Encounter for antineoplastic chemotherapy: Secondary | ICD-10-CM

## 2018-09-29 DIAGNOSIS — E876 Hypokalemia: Secondary | ICD-10-CM

## 2018-09-29 DIAGNOSIS — Z7901 Long term (current) use of anticoagulants: Secondary | ICD-10-CM

## 2018-09-29 DIAGNOSIS — R17 Unspecified jaundice: Secondary | ICD-10-CM

## 2018-09-29 DIAGNOSIS — R748 Abnormal levels of other serum enzymes: Secondary | ICD-10-CM | POA: Diagnosis not present

## 2018-09-29 LAB — CBC WITH DIFFERENTIAL/PLATELET
Abs Immature Granulocytes: 0.03 10*3/uL (ref 0.00–0.07)
Basophils Absolute: 0 10*3/uL (ref 0.0–0.1)
Basophils Relative: 1 %
Eosinophils Absolute: 0 10*3/uL (ref 0.0–0.5)
Eosinophils Relative: 0 %
HCT: 32.7 % — ABNORMAL LOW (ref 36.0–46.0)
Hemoglobin: 11.1 g/dL — ABNORMAL LOW (ref 12.0–15.0)
Immature Granulocytes: 0 %
Lymphocytes Relative: 19 %
Lymphs Abs: 1.5 10*3/uL (ref 0.7–4.0)
MCH: 30.3 pg (ref 26.0–34.0)
MCHC: 33.9 g/dL (ref 30.0–36.0)
MCV: 89.3 fL (ref 80.0–100.0)
Monocytes Absolute: 0.5 10*3/uL (ref 0.1–1.0)
Monocytes Relative: 7 %
Neutro Abs: 5.6 10*3/uL (ref 1.7–7.7)
Neutrophils Relative %: 73 %
Platelets: 158 10*3/uL (ref 150–400)
RBC: 3.66 MIL/uL — ABNORMAL LOW (ref 3.87–5.11)
RDW: 15.7 % — ABNORMAL HIGH (ref 11.5–15.5)
WBC: 7.7 10*3/uL (ref 4.0–10.5)
nRBC: 0 % (ref 0.0–0.2)

## 2018-09-29 LAB — COMPREHENSIVE METABOLIC PANEL
ALT: 11 U/L (ref 0–44)
AST: 24 U/L (ref 15–41)
Albumin: 3.9 g/dL (ref 3.5–5.0)
Alkaline Phosphatase: 98 U/L (ref 38–126)
Anion gap: 9 (ref 5–15)
BUN: 25 mg/dL — ABNORMAL HIGH (ref 8–23)
CO2: 21 mmol/L — ABNORMAL LOW (ref 22–32)
Calcium: 9.3 mg/dL (ref 8.9–10.3)
Chloride: 111 mmol/L (ref 98–111)
Creatinine, Ser: 1.14 mg/dL — ABNORMAL HIGH (ref 0.44–1.00)
GFR calc Af Amer: 53 mL/min — ABNORMAL LOW (ref >60)
GFR calc non Af Amer: 46 mL/min — ABNORMAL LOW (ref >60)
Glucose, Bld: 134 mg/dL — ABNORMAL HIGH (ref 70–99)
Potassium: 3.3 mmol/L — ABNORMAL LOW (ref 3.5–5.1)
Sodium: 141 mmol/L (ref 135–145)
Total Bilirubin: 1.4 mg/dL — ABNORMAL HIGH (ref 0.3–1.2)
Total Protein: 6.2 g/dL — ABNORMAL LOW (ref 6.5–8.1)

## 2018-09-29 LAB — MAGNESIUM: Magnesium: 1.7 mg/dL (ref 1.7–2.4)

## 2018-09-29 MED ORDER — SODIUM CHLORIDE 0.9 % IV SOLN: 384.5000 mg | Freq: Once | INTRAVENOUS | Status: DC

## 2018-09-29 MED ORDER — DIPHENHYDRAMINE HCL 50 MG/ML IJ SOLN
50.0000 mg | Freq: Once | INTRAMUSCULAR | Status: AC
  Administered 2018-09-29: 50 mg via INTRAVENOUS
  Filled 2018-09-29: qty 1

## 2018-09-29 MED ORDER — SODIUM CHLORIDE 0.9 % IV SOLN: 135.0000 mg/m2 | Freq: Once | INTRAVENOUS | Status: DC

## 2018-09-29 MED ORDER — SODIUM CHLORIDE 0.9 % IV SOLN
343.0000 mg | Freq: Once | INTRAVENOUS | Status: AC
  Administered 2018-09-29: 340 mg via INTRAVENOUS
  Filled 2018-09-29: qty 34

## 2018-09-29 MED ORDER — POTASSIUM CHLORIDE CRYS ER 20 MEQ PO TBCR: 20.0000 meq | EXTENDED_RELEASE_TABLET | Freq: Every day | ORAL | 1 refills | Status: DC

## 2018-09-29 MED ORDER — SODIUM CHLORIDE 0.9 % IV SOLN
Freq: Once | INTRAVENOUS | Status: AC
  Administered 2018-09-29: 11:00:00 via INTRAVENOUS
  Filled 2018-09-29: qty 250

## 2018-09-29 MED ORDER — PALONOSETRON HCL INJECTION 0.25 MG/5ML
0.2500 mg | Freq: Once | INTRAVENOUS | Status: AC
  Administered 2018-09-29: 0.25 mg via INTRAVENOUS
  Filled 2018-09-29: qty 5

## 2018-09-29 MED ORDER — HEPARIN SOD (PORK) LOCK FLUSH 100 UNIT/ML IV SOLN
500.0000 [IU] | Freq: Once | INTRAVENOUS | Status: AC | PRN
  Administered 2018-09-29: 500 [IU]
  Filled 2018-09-29: qty 5

## 2018-09-29 MED ORDER — SODIUM CHLORIDE 0.9 % IV SOLN
135.0000 mg/m2 | Freq: Once | INTRAVENOUS | Status: AC
  Administered 2018-09-29: 246 mg via INTRAVENOUS
  Filled 2018-09-29: qty 41

## 2018-09-29 MED ORDER — FAMOTIDINE IN NACL 20-0.9 MG/50ML-% IV SOLN
20.0000 mg | Freq: Once | INTRAVENOUS | Status: AC
  Administered 2018-09-29: 20 mg via INTRAVENOUS
  Filled 2018-09-29: qty 50

## 2018-09-29 MED ORDER — SODIUM CHLORIDE 0.9 % IV SOLN
20.0000 mg | Freq: Once | INTRAVENOUS | Status: AC
  Administered 2018-09-29: 20 mg via INTRAVENOUS
  Filled 2018-09-29: qty 2

## 2018-09-29 MED ORDER — PEGFILGRASTIM 6 MG/0.6ML ~~LOC~~ PSKT
6.0000 mg | PREFILLED_SYRINGE | Freq: Once | SUBCUTANEOUS | Status: AC
  Administered 2018-09-29: 6 mg via SUBCUTANEOUS
  Filled 2018-09-29: qty 0.6

## 2018-09-29 MED ORDER — SODIUM CHLORIDE 0.9% FLUSH
10.0000 mL | Freq: Once | INTRAVENOUS | Status: AC
  Administered 2018-09-29: 10:00:00 10 mL via INTRAVENOUS
  Filled 2018-09-29: qty 10

## 2018-09-29 NOTE — Progress Notes (Signed)
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 She is on Eliquis and Plavix.   She was initially seen by GI for iron deficiency anemia and positive stool occult. Endoscopy did not reveal any source of bleeding or malignancy. Colonoscopy could not be completed as patient was retching during the procedure and plan was to reschedule colonoscopy under intubation. During that work-up she underwent a CT abdomen and which showed a 4.6 cm right ovarian mass which was initially presumed to be a cyst. Patient has had a total vaginal hysterectomy done about 20 years ago. She was seen by Dr. Ouida Sills and underwent laparoscopic bilateral salpingo-oophorectomy with lysis of adhesions on 06/24/2018. Pathology showed microscopic focus of high-grade serous carcinoma involving both the left and the right ovary  DIAGNOSIS:  A. OVARY AND FALLOPIAN TUBE, LEFT; SALPINGO-OOPHORECTOMY:  OVARY  - MICROSCOPIC FOCUS OF HIGH-GRADE SEROUS CARCINOMA, SEE COMMENT.  - SEROUS CYST ADENOFIBROMA.  - MUCINOUS CYSTADENOMA WITH FOCAL PROLIFERATIVE FEATURES.  - FOCI SUGGESTIVE OF ENDOMETRIOSIS.  - FALLOPIAN TUBE WITH BENIGN PARATUBAL CYST.   B. OVARY AND FALLOPIAN TUBE, RIGHT; SALPINGO-OOPHORECTOMY:  OVARY  - THREE MICROSCOPIC FOCI OF HIGH-GRADE SEROUS CARCINOMA, SEE COMMENT.   - OVARY WITH SEROUS CYST ADENOFIBROMA.  - ENDOMETRIOSIS.  - FIBROUS SEROSAL ADHESIONS.  - FALLOPIAN TUBE WITH BENIGN PARATUBAL CYSTS.    CT chest abdomen and pelvis on 07/12/2018 showed a right lower lobe pulmonary nodule mildly larger than September 2019 which raises the concern for malignancy. No other evidence of malignancy elsewhere. Patient then underwent a PET scan on 07/19/2018 which showed a right lower lobe lung nodule was not hypermetabolic with an SUV of 1.7 below mediastinal background activity and no enlarged hypermetabolic mediastinal or hilar lymph nodes. This was considered likely benign  Patient's case was discussed with GYN conference and patient saw Dr. Fransisca Connors as well and adjuvant chemotherapy with carboplatin and Taxol was recommended given high risk of recurrence. Baseline Ca1 25 was normal   Interval history- she feels fatigued. Reports right sided chest wall pain which worsens with movement. It is sharp and intermittent. Not associated with deep breath or food.  Patient also complains of some tingling numbness mainly in her bilateral feet but also in the fingers of her left hand.  ECOG PS- 1 Pain scale- 6 Opioid associated constipation- no  Review of systems- Review of Systems  Constitutional: Positive for malaise/fatigue. Negative for chills, fever and weight loss.  HENT: Negative for congestion, ear discharge and nosebleeds.   Eyes: Negative for blurred vision.  Respiratory: Negative for cough, hemoptysis, sputum production, shortness of breath and wheezing.        Right chest wall pain  Cardiovascular: Negative for chest pain, palpitations, orthopnea and claudication.  Gastrointestinal: Negative for abdominal pain, blood in stool, constipation, diarrhea, heartburn, melena, nausea and vomiting.  Genitourinary: Negative for dysuria, flank pain, frequency, hematuria and urgency.  Musculoskeletal: Positive for back  pain. Negative for joint pain and myalgias.   Skin: Negative for rash.  Neurological: Positive for sensory change (Peripheral neuropathy). Negative for dizziness, tingling, focal weakness, seizures, weakness and headaches.  Endo/Heme/Allergies: Does not bruise/bleed easily.  Psychiatric/Behavioral: Negative for depression and suicidal ideas. The patient does not have insomnia.       Allergies  Allergen Reactions  . Erythromycin Base Nausea And Vomiting  . Tape Itching, Rash and Other (See Comments)    Also "band-aids"     Past Medical History:  Diagnosis Date  . Acetabular fracture ([place])   . Anxiety and depression   . Bartholin cyst   . CAD (coronary artery disease)   . Cancer ([place])    mouth cyst was cancer many years ago   . Cellulitis of right leg 2005  . CHF (congestive heart failure) ([place])   . Colon polyp   . Complication of anesthesia   . Diabetes mellitus without complication ([place])   . DJD (degenerative joint disease)   . Dyspnea on exertion   . Dysrhythmia    PAF  . GERD (gastroesophageal reflux disease)   . Hyperlipidemia   . Hypertension   . Morbid obesity ([place])   . Myocardial infarction ([place]) 06/14/2017  . Ovarian cancer ([place])   . PAF (paroxysmal atrial fibrillation) ([place])    a. diagnosed 7/19; b. CHADS2VASc 8; c. Eliquis  . PONV (postoperative nausea and vomiting)   . Presence of stent in right coronary artery 02/08/12   2.5x18 Xience distal RCA  . Right shoulder pain   . Stroke ([place]) 2017  . Vaginal prolapse      Past Surgical History:  Procedure Laterality Date  . ABDOMINAL HYSTERECTOMY     supracervical abdominal w/removal tubes &/or ovaries  . BREAST BIOPSY  1989  . CHOLECYSTECTOMY    . COLONOSCOPY    . COLONOSCOPY WITH PROPOFOL N/A 06/21/2018   Procedure: COLONOSCOPY WITH PROPOFOL;  Surgeon: Lollie Sails, MD;  Location: [facility] ENDOSCOPY;  Service: Endoscopy;  Laterality: N/A;  . CORONARY STENT INTERVENTION N/A 06/14/2017   Procedure: CORONARY STENT INTERVENTION;  Surgeon: Wellington Hampshire, MD;  Location: [place] CV LAB;  Service: Cardiovascular;  Laterality: N/A;  . ESOPHAGOGASTRODUODENOSCOPY N/A 06/21/2018   Procedure: ESOPHAGOGASTRODUODENOSCOPY (EGD);  Surgeon: Lollie Sails, MD;  Location: [facility] ENDOSCOPY;  Service: Endoscopy;  Laterality: N/A;  . FACIAL COSMETIC SURGERY     face/neck lift  . FRACTURE SURGERY    . GASTRIC BYPASS  2008  . JOINT REPLACEMENT    . KNEE SURGERY     left knee arthroscopic  . LAPAROSCOPIC BILATERAL SALPINGO OOPHERECTOMY Bilateral 06/24/2018   Procedure: LAPAROSCOPIC BILATERAL SALPINGO OOPHORECTOMY;  Surgeon: Schermerhorn, Gwen Her, MD;  Location: [facility] ORS;  Service: Gynecology;  Laterality: Bilateral;  . LEFT HEART CATH AND CORONARY ANGIOGRAPHY N/A 06/14/2017   Procedure: LEFT HEART CATH AND CORONARY ANGIOGRAPHY;  Surgeon: Wellington Hampshire, MD;  Location: [place] [facility];  Service: Cardiovascular;  Laterality: N/A;  . LEFT HEART CATH AND CORONARY ANGIOGRAPHY N/A 06/14/2017   Procedure: LEFT HEART CATH AND CORONARY ANGIOGRAPHY;  Surgeon: Wellington Hampshire, MD;  Location: [place] [facility];  Service: Cardiovascular;  Laterality: N/A;  . LYSIS OF ADHESION  06/24/2018   Procedure: LYSIS OF ADHESION;  Surgeon: Ouida Sills Gwen Her, MD;  Location: [facility] ORS;  Service: Gynecology;;  . Crisoforo [place]   left hip fracture/repair and left arm fracture  . OTHER SURGICAL HISTORY  1986   hysterectomy  .  PORTA CATH INSERTION N/A 07/26/2018   Procedure: PORTA CATH INSERTION;  Surgeon: Katha Cabal, MD;  Location: [place] [facility] LAB;  Service: Cardiovascular;  Laterality: N/A;  . right knee  late 90's   arthroscopic  . ROUX-EN-Y PROCEDURE    . SALIVARY GLAND SURGERY    . TOTAL HIP ARTHROPLASTY  2004   left    Social History   Socioeconomic History  . Marital status: Married    Spouse name: Not on file  . Number of children: 1  . Years of education: Not on file  . Highest education level: Not on file  Occupational History   . Occupation: nurse-retired  Social Needs  . Financial resource strain: Not on file  . Food insecurity:    Worry: Not on file    Inability: Not on file  . Transportation needs:    Medical: Not on file    Non-medical: Not on file  Tobacco Use  . Smoking status: Former Smoker    Packs/day: 2.00    Years: 26.00    Pack years: 52.00    Types: Cigarettes    Last attempt to quit: 06/22/1984    Years since quitting: 34.2  . Smokeless tobacco: Never Used  Substance and Sexual Activity  . Alcohol use: Yes    Alcohol/week: 0.0 standard drinks    Frequency: Never    Comment: rare- less than once a month  . Drug use: No  . Sexual activity: Yes    Birth control/protection: Surgical    Comment: hysterectomy  Lifestyle  . Physical activity:    Days per week: Not on file    Minutes per session: Not on file  . Stress: Not on file  Relationships  . Social connections:    Talks on phone: Not on file    Gets together: Not on file    Attends religious service: Not on file    Active member of club or organization: Not on file    Attends meetings of clubs or organizations: Not on file    Relationship status: Not on file  . Intimate partner violence:    Fear of current or ex partner: Not on file    Emotionally abused: Not on file    Physically abused: Not on file    Forced sexual activity: Not on file  Other Topics Concern  . Not on file  Social History Narrative  . Not on file    Family History  Problem Relation Age of Onset  . Heart attack Father        MI age 37 and 4  . Heart attack Sister 14       cabg; deceased 24  . Diabetes Sister   . Hypertension Sister   . Heart attack Brother 6       cabg; deceased 41s  . Diabetes Brother   . Colon cancer Mother 93       deceased 61  . Diabetes Maternal Grandmother   . Cancer Maternal Grandfather        unknown type; deceased  . Hypertension Daughter   . Thyroid disease Daughter   . Breast cancer Neg Hx      Current  Outpatient Medications:  .  amLODipine (NORVASC) 2.5 MG tablet, Take 1 tablet (2.5 mg total) by mouth daily., Disp: 90 tablet, Rfl: 3 .  apixaban (ELIQUIS) 5 MG TABS tablet, Take 1 tablet (5 mg total) by mouth 2 (two) times daily., Disp: 60 tablet, Rfl: 0 .  clopidogrel (PLAVIX) 75 MG tablet, Take 1 tablet (75 mg total) by mouth daily., Disp: 30 tablet, Rfl: 0 .  cyanocobalamin (,VITAMIN B-12,) 1000 MCG/ML injection, Inject 1,000 mcg into the muscle every 30 (thirty) days., Disp: , Rfl:  .  cyclobenzaprine (FLEXERIL) 10 MG tablet, Take 10 mg by mouth 2 (two) times daily as needed for spasms or pain., Disp: , Rfl:  .  dexamethasone (DECADRON) 4 MG tablet, Take 2 tablets (8 mg total) by mouth daily. Start the day after chemotherapy for 2 days., Disp: 30 tablet, Rfl: 1 .  ergocalciferol (VITAMIN D2) 1.25 MG (50000 UT) capsule, Take 50,000 Units by mouth every Monday., Disp: , Rfl:  .  ezetimibe (ZETIA) 10 MG tablet, TAKE 1 TABLET(10 MG) BY MOUTH DAILY (Patient taking differently: Take 10 mg by mouth daily. ), Disp: 30 tablet, Rfl: 5 .  ferrous sulfate 325 (65 FE) MG tablet, Take 325 mg by mouth daily with breakfast., Disp: , Rfl:  .  HYDROcodone-acetaminophen (NORCO/VICODIN) 5-325 MG tablet, Take 1 tablet by mouth at bedtime as needed for moderate pain., Disp: 30 tablet, Rfl: 0 .  lidocaine-prilocaine (EMLA) cream, Apply to affected area once, Disp: 30 g, Rfl: 3 .  LORazepam (ATIVAN) 0.5 MG tablet, Take 1 tablet (0.5 mg total) by mouth every 6 (six) hours as needed (Nausea or vomiting)., Disp: 30 tablet, Rfl: 0 .  losartan (COZAAR) 100 MG tablet, Take 1 tablet (100 mg total) by mouth daily., Disp: 30 tablet, Rfl: 1 .  Multiple Vitamin (MULTIVITAMIN WITH MINERALS) TABS tablet, Take 1 tablet by mouth daily., Disp: 30 tablet, Rfl: 1 .  Multiple Vitamins-Minerals (OCUVITE PO), Take 1 capsule by mouth daily., Disp: , Rfl:  .  nitroGLYCERIN (NITROSTAT) 0.4 MG SL tablet, Place 1 tablet (0.4 mg total) under the  tongue every 5 (five) minutes as needed for chest pain., Disp: 30 tablet, Rfl: 2 .  ondansetron (ZOFRAN) 4 MG tablet, Take 4 mg by mouth every 8 (eight) hours as needed for nausea or vomiting., Disp: , Rfl:  .  prochlorperazine (COMPAZINE) 10 MG tablet, Take 1 tablet (10 mg total) by mouth every 6 (six) hours as needed (Nausea or vomiting)., Disp: 30 tablet, Rfl: 1 .  rosuvastatin (CRESTOR) 40 MG tablet, Take 40 mg by mouth at bedtime., Disp: , Rfl:  .  senna-docusate (SENNA S) 8.6-50 MG tablet, Take 1 tablet by mouth daily. , Disp: , Rfl:  .  sertraline (ZOLOFT) 100 MG tablet, Take 100 mg by mouth daily. , Disp: , Rfl:  .  potassium chloride SA (K-DUR,KLOR-CON) 20 MEQ tablet, Take 1 tablet (20 mEq total) by mouth daily., Disp: 30 tablet, Rfl: 1 .  protein supplement shake (PREMIER PROTEIN) LIQD, Take 325 mLs (11 oz total) by mouth 2 (two) times daily between meals. (Patient not taking: Reported on 09/29/2018), Disp: 60 Can, Rfl: 0 No current facility-administered medications for this visit.   Facility-Administered Medications Ordered in Other Visits:  .  CARBOplatin (PARAPLATIN) 340 mg in sodium chloride 0.9 % 250 mL chemo infusion, 340 mg, Intravenous, Once, Sindy Guadeloupe, MD .  heparin lock flush 100 unit/mL, 500 Units, Intracatheter, Once PRN, Sindy Guadeloupe, MD .  PACLitaxel (TAXOL) 246 mg in sodium chloride 0.9 % 250 mL chemo infusion (> 74m/m2), 135 mg/m2 (Order-Specific), Intravenous, Once, RSindy Guadeloupe MD, Last Rate: 97 mL/hr at 09/29/18 1150, 246 mg at 09/29/18 1150 .  pegfilgrastim (NEULASTA ONPRO KIT) injection 6 mg, 6 mg, Subcutaneous, Once, RSindy Guadeloupe MD  Physical exam:  Vitals:   09/29/18 1019  BP: 108/73  Pulse: (!) 58  Resp: 18  Temp: (!) 96.8 F (36 C)  TempSrc: Tympanic  Weight: 152 lb 3.2 oz (69 kg)  Height: 5' 8"  (1.727 m)   Physical Exam Constitutional:      Comments: Appears fatigued  HENT:     Head: Normocephalic and atraumatic.  Eyes:     Pupils:  Pupils are equal, round, and reactive to light.  Neck:     Musculoskeletal: Normal range of motion.  Cardiovascular:     Rate and Rhythm: Normal rate and regular rhythm.     Heart sounds: Normal heart sounds.  Pulmonary:     Effort: Pulmonary effort is normal.     Breath sounds: Normal breath sounds.  Abdominal:     General: Bowel sounds are normal.     Palpations: Abdomen is soft.     Comments: No flank tenderness/ no RUQ tenderness  Musculoskeletal:     Comments: Tenderness to palpation over right 9th rib posteriorly.   Skin:    General: Skin is warm and dry.  Neurological:     Mental Status: She is alert and oriented to person, place, and time.      CMP Latest Ref Rng & Units 09/29/2018  Glucose 70 - 99 mg/dL 134(H)  BUN 8 - 23 mg/dL 25(H)  Creatinine 0.44 - 1.00 mg/dL 1.14(H)  Sodium 135 - 145 mmol/L 141  Potassium 3.5 - 5.1 mmol/L 3.3(L)  Chloride 98 - 111 mmol/L 111  CO2 22 - 32 mmol/L 21(L)  Calcium 8.9 - 10.3 mg/dL 9.3  Total Protein 6.5 - 8.1 g/dL 6.2(L)  Total Bilirubin 0.3 - 1.2 mg/dL 1.4(H)  Alkaline Phos 38 - 126 U/L 98  AST 15 - 41 U/L 24  ALT 0 - 44 U/L 11   CBC Latest Ref Rng & Units 09/29/2018  WBC 4.0 - 10.5 K/uL 7.7  Hemoglobin 12.0 - 15.0 g/dL 11.1(L)  Hematocrit 36.0 - 46.0 % 32.7(L)  Platelets 150 - 400 K/uL 158     Assessment and plan- Patient is a 78 y.o. female withstage II high-grade serous adenocarcinoma of the ovary T2 NX M0. She is here for on treatment assessment prior to cycle 4 of carbotaxol chemotherapy  Counts okay to proceed with cycle 4 of carbotaxol chemotherapy today with on pro-Neulasta support.  Her hemoglobin white count and platelets have remained stable.  However her bilirubin is elevated at 1.4 with a normal AST and ALT.  She has not had this before.  I will therefore boost reduce her Taxol to 135 mg/m.  Plan is to complete total 6 cycles of chemotherapy.  Suspect her right chest wall pain is musculoskeletal as there is  tenderness to palpation and per history it gets worse with movement.  Recommend conservative measures including over-the-counter Tylenol.  If it gets worse patient will let us know about it.  I will see her back in 3 weeks time with CBC with differential and CMP for cycle 5 of carbotaxol chemotherapy  Hypokalemia: We will renew her oral potassium   Visit Diagnosis 1. Encounter for antineoplastic chemotherapy   2. Elevated bilirubin   3. Primary high grade serous adenocarcinoma of ovary ([place])   4. Hypokalemia      Dr. Randa Evens, MD, MPH [facility] [phone] 09/29/2018 12:17 PM

## 2018-09-29 NOTE — Progress Notes (Signed)
Sob on exertion, tired. Right side of back in middle area

## 2018-09-29 NOTE — Progress Notes (Signed)
Per Judeen Hammans RN per Dr. Janese Banks, okay to proceed with treatment today with Bilirubin of 1.4 and potassium 3.3, no IV potassium needed at this time.

## 2018-09-30 ENCOUNTER — Other Ambulatory Visit: Payer: Self-pay | Admitting: *Deleted

## 2018-09-30 LAB — CA 125: Cancer Antigen (CA) 125: 10.3 U/mL (ref 0.0–38.1)

## 2018-10-04 DIAGNOSIS — W010XXA Fall on same level from slipping, tripping and stumbling without subsequent striking against object, initial encounter: Secondary | ICD-10-CM | POA: Diagnosis not present

## 2018-10-04 DIAGNOSIS — E538 Deficiency of other specified B group vitamins: Secondary | ICD-10-CM | POA: Diagnosis not present

## 2018-10-04 DIAGNOSIS — I959 Hypotension, unspecified: Secondary | ICD-10-CM | POA: Diagnosis not present

## 2018-10-04 DIAGNOSIS — E860 Dehydration: Secondary | ICD-10-CM | POA: Diagnosis not present

## 2018-10-04 DIAGNOSIS — Y92009 Unspecified place in unspecified non-institutional (private) residence as the place of occurrence of the external cause: Secondary | ICD-10-CM | POA: Diagnosis not present

## 2018-10-04 DIAGNOSIS — M25552 Pain in left hip: Secondary | ICD-10-CM | POA: Diagnosis not present

## 2018-10-07 ENCOUNTER — Encounter: Payer: Self-pay | Admitting: Intensive Care

## 2018-10-07 ENCOUNTER — Other Ambulatory Visit: Payer: Self-pay

## 2018-10-07 ENCOUNTER — Telehealth: Payer: Self-pay | Admitting: *Deleted

## 2018-10-07 ENCOUNTER — Emergency Department: Payer: PPO

## 2018-10-07 ENCOUNTER — Emergency Department
Admission: EM | Admit: 2018-10-07 | Discharge: 2018-10-07 | Disposition: A | Discharge status: Other Acute Inpatient Hospital | Payer: PPO | Attending: Emergency Medicine | Admitting: Emergency Medicine

## 2018-10-07 ENCOUNTER — Encounter: Payer: Self-pay | Admitting: Oncology

## 2018-10-07 ENCOUNTER — Inpatient Hospital Stay (HOSPITAL_COMMUNITY)
Admission: AD | Admit: 2018-10-07 | Discharge: 2018-10-12 | DRG: 086 | Disposition: A | Discharge status: Rehabilitation Facility | Payer: PPO | Source: Other Acute Inpatient Hospital | Attending: Neurosurgery | Admitting: Neurosurgery

## 2018-10-07 DIAGNOSIS — Z751 Person awaiting admission to adequate facility elsewhere: Secondary | ICD-10-CM | POA: Diagnosis not present

## 2018-10-07 DIAGNOSIS — K219 Gastro-esophageal reflux disease without esophagitis: Secondary | ICD-10-CM | POA: Diagnosis present

## 2018-10-07 DIAGNOSIS — I5041 Acute combined systolic (congestive) and diastolic (congestive) heart failure: Secondary | ICD-10-CM | POA: Diagnosis not present

## 2018-10-07 DIAGNOSIS — W19XXXA Unspecified fall, initial encounter: Secondary | ICD-10-CM | POA: Diagnosis not present

## 2018-10-07 DIAGNOSIS — Z91048 Other nonmedicinal substance allergy status: Secondary | ICD-10-CM

## 2018-10-07 DIAGNOSIS — I5032 Chronic diastolic (congestive) heart failure: Secondary | ICD-10-CM | POA: Diagnosis not present

## 2018-10-07 DIAGNOSIS — Z87891 Personal history of nicotine dependence: Secondary | ICD-10-CM

## 2018-10-07 DIAGNOSIS — R5383 Other fatigue: Secondary | ICD-10-CM | POA: Diagnosis not present

## 2018-10-07 DIAGNOSIS — Z9884 Bariatric surgery status: Secondary | ICD-10-CM | POA: Diagnosis not present

## 2018-10-07 DIAGNOSIS — Y999 Unspecified external cause status: Secondary | ICD-10-CM | POA: Insufficient documentation

## 2018-10-07 DIAGNOSIS — I609 Nontraumatic subarachnoid hemorrhage, unspecified: Secondary | ICD-10-CM | POA: Diagnosis not present

## 2018-10-07 DIAGNOSIS — Z881 Allergy status to other antibiotic agents status: Secondary | ICD-10-CM

## 2018-10-07 DIAGNOSIS — D62 Acute posthemorrhagic anemia: Secondary | ICD-10-CM

## 2018-10-07 DIAGNOSIS — Z90710 Acquired absence of both cervix and uterus: Secondary | ICD-10-CM

## 2018-10-07 DIAGNOSIS — I952 Hypotension due to drugs: Secondary | ICD-10-CM | POA: Diagnosis not present

## 2018-10-07 DIAGNOSIS — Z7901 Long term (current) use of anticoagulants: Secondary | ICD-10-CM | POA: Diagnosis not present

## 2018-10-07 DIAGNOSIS — Z8673 Personal history of transient ischemic attack (TIA), and cerebral infarction without residual deficits: Secondary | ICD-10-CM | POA: Diagnosis not present

## 2018-10-07 DIAGNOSIS — Z7902 Long term (current) use of antithrombotics/antiplatelets: Secondary | ICD-10-CM

## 2018-10-07 DIAGNOSIS — E119 Type 2 diabetes mellitus without complications: Secondary | ICD-10-CM | POA: Diagnosis not present

## 2018-10-07 DIAGNOSIS — H109 Unspecified conjunctivitis: Secondary | ICD-10-CM | POA: Diagnosis not present

## 2018-10-07 DIAGNOSIS — S066X0A Traumatic subarachnoid hemorrhage without loss of consciousness, initial encounter: Principal | ICD-10-CM | POA: Diagnosis present

## 2018-10-07 DIAGNOSIS — F419 Anxiety disorder, unspecified: Secondary | ICD-10-CM | POA: Diagnosis not present

## 2018-10-07 DIAGNOSIS — G4700 Insomnia, unspecified: Secondary | ICD-10-CM | POA: Diagnosis present

## 2018-10-07 DIAGNOSIS — F329 Major depressive disorder, single episode, unspecified: Secondary | ICD-10-CM | POA: Diagnosis not present

## 2018-10-07 DIAGNOSIS — N390 Urinary tract infection, site not specified: Secondary | ICD-10-CM | POA: Diagnosis not present

## 2018-10-07 DIAGNOSIS — E876 Hypokalemia: Secondary | ICD-10-CM | POA: Diagnosis not present

## 2018-10-07 DIAGNOSIS — I69398 Other sequelae of cerebral infarction: Secondary | ICD-10-CM | POA: Diagnosis not present

## 2018-10-07 DIAGNOSIS — Z833 Family history of diabetes mellitus: Secondary | ICD-10-CM

## 2018-10-07 DIAGNOSIS — Y92009 Unspecified place in unspecified non-institutional (private) residence as the place of occurrence of the external cause: Secondary | ICD-10-CM | POA: Diagnosis not present

## 2018-10-07 DIAGNOSIS — S066X9D Traumatic subarachnoid hemorrhage with loss of consciousness of unspecified duration, subsequent encounter: Secondary | ICD-10-CM | POA: Diagnosis not present

## 2018-10-07 DIAGNOSIS — D509 Iron deficiency anemia, unspecified: Secondary | ICD-10-CM | POA: Diagnosis not present

## 2018-10-07 DIAGNOSIS — H548 Legal blindness, as defined in USA: Secondary | ICD-10-CM | POA: Diagnosis present

## 2018-10-07 DIAGNOSIS — E1151 Type 2 diabetes mellitus with diabetic peripheral angiopathy without gangrene: Secondary | ICD-10-CM | POA: Diagnosis not present

## 2018-10-07 DIAGNOSIS — Y9389 Activity, other specified: Secondary | ICD-10-CM | POA: Insufficient documentation

## 2018-10-07 DIAGNOSIS — R269 Unspecified abnormalities of gait and mobility: Secondary | ICD-10-CM | POA: Diagnosis not present

## 2018-10-07 DIAGNOSIS — E860 Dehydration: Secondary | ICD-10-CM | POA: Insufficient documentation

## 2018-10-07 DIAGNOSIS — S065X0S Traumatic subdural hemorrhage without loss of consciousness, sequela: Secondary | ICD-10-CM | POA: Diagnosis not present

## 2018-10-07 DIAGNOSIS — R296 Repeated falls: Secondary | ICD-10-CM | POA: Diagnosis present

## 2018-10-07 DIAGNOSIS — C569 Malignant neoplasm of unspecified ovary: Secondary | ICD-10-CM | POA: Insufficient documentation

## 2018-10-07 DIAGNOSIS — I480 Paroxysmal atrial fibrillation: Secondary | ICD-10-CM | POA: Diagnosis not present

## 2018-10-07 DIAGNOSIS — R531 Weakness: Secondary | ICD-10-CM | POA: Diagnosis not present

## 2018-10-07 DIAGNOSIS — I2510 Atherosclerotic heart disease of native coronary artery without angina pectoris: Secondary | ICD-10-CM | POA: Diagnosis not present

## 2018-10-07 DIAGNOSIS — A419 Sepsis, unspecified organism: Secondary | ICD-10-CM

## 2018-10-07 DIAGNOSIS — I5042 Chronic combined systolic (congestive) and diastolic (congestive) heart failure: Secondary | ICD-10-CM | POA: Diagnosis not present

## 2018-10-07 DIAGNOSIS — S06369A Traumatic hemorrhage of cerebrum, unspecified, with loss of consciousness of unspecified duration, initial encounter: Secondary | ICD-10-CM | POA: Diagnosis not present

## 2018-10-07 DIAGNOSIS — Z9221 Personal history of antineoplastic chemotherapy: Secondary | ICD-10-CM | POA: Diagnosis not present

## 2018-10-07 DIAGNOSIS — S065X1S Traumatic subdural hemorrhage with loss of consciousness of 30 minutes or less, sequela: Secondary | ICD-10-CM | POA: Diagnosis not present

## 2018-10-07 DIAGNOSIS — I110 Hypertensive heart disease with heart failure: Secondary | ICD-10-CM | POA: Diagnosis not present

## 2018-10-07 DIAGNOSIS — Z955 Presence of coronary angioplasty implant and graft: Secondary | ICD-10-CM

## 2018-10-07 DIAGNOSIS — Z79899 Other long term (current) drug therapy: Secondary | ICD-10-CM | POA: Insufficient documentation

## 2018-10-07 DIAGNOSIS — Z800 Family history of malignant neoplasm of digestive organs: Secondary | ICD-10-CM

## 2018-10-07 DIAGNOSIS — Z8543 Personal history of malignant neoplasm of ovary: Secondary | ICD-10-CM | POA: Diagnosis not present

## 2018-10-07 DIAGNOSIS — Z8249 Family history of ischemic heart disease and other diseases of the circulatory system: Secondary | ICD-10-CM | POA: Diagnosis not present

## 2018-10-07 DIAGNOSIS — I252 Old myocardial infarction: Secondary | ICD-10-CM | POA: Diagnosis not present

## 2018-10-07 DIAGNOSIS — D508 Other iron deficiency anemias: Secondary | ICD-10-CM | POA: Diagnosis not present

## 2018-10-07 DIAGNOSIS — Z9181 History of falling: Secondary | ICD-10-CM | POA: Diagnosis not present

## 2018-10-07 DIAGNOSIS — I509 Heart failure, unspecified: Secondary | ICD-10-CM | POA: Diagnosis not present

## 2018-10-07 DIAGNOSIS — I10 Essential (primary) hypertension: Secondary | ICD-10-CM | POA: Diagnosis not present

## 2018-10-07 DIAGNOSIS — S065X9A Traumatic subdural hemorrhage with loss of consciousness of unspecified duration, initial encounter: Secondary | ICD-10-CM | POA: Diagnosis not present

## 2018-10-07 DIAGNOSIS — S066X9A Traumatic subarachnoid hemorrhage with loss of consciousness of unspecified duration, initial encounter: Secondary | ICD-10-CM | POA: Diagnosis not present

## 2018-10-07 DIAGNOSIS — D638 Anemia in other chronic diseases classified elsewhere: Secondary | ICD-10-CM | POA: Diagnosis not present

## 2018-10-07 DIAGNOSIS — E785 Hyperlipidemia, unspecified: Secondary | ICD-10-CM | POA: Diagnosis present

## 2018-10-07 DIAGNOSIS — Z8349 Family history of other endocrine, nutritional and metabolic diseases: Secondary | ICD-10-CM

## 2018-10-07 LAB — BASIC METABOLIC PANEL
Anion gap: 17 — ABNORMAL HIGH (ref 5–15)
BUN: 24 mg/dL — ABNORMAL HIGH (ref 8–23)
CO2: 17 mmol/L — ABNORMAL LOW (ref 22–32)
Calcium: 9.7 mg/dL (ref 8.9–10.3)
Chloride: 107 mmol/L (ref 98–111)
Creatinine, Ser: 1.02 mg/dL — ABNORMAL HIGH (ref 0.44–1.00)
GFR calc Af Amer: >60 mL/min (ref >60)
GFR calc non Af Amer: 52 mL/min — ABNORMAL LOW (ref >60)
Glucose, Bld: 94 mg/dL (ref 70–99)
Potassium: 3.6 mmol/L (ref 3.5–5.1)
Sodium: 141 mmol/L (ref 135–145)

## 2018-10-07 LAB — CBC
HCT: 33.2 % — ABNORMAL LOW (ref 36.0–46.0)
Hemoglobin: 10.9 g/dL — ABNORMAL LOW (ref 12.0–15.0)
MCH: 30.4 pg (ref 26.0–34.0)
MCHC: 32.8 g/dL (ref 30.0–36.0)
MCV: 92.5 fL (ref 80.0–100.0)
Platelets: 128 10*3/uL — ABNORMAL LOW (ref 150–400)
RBC: 3.59 MIL/uL — ABNORMAL LOW (ref 3.87–5.11)
RDW: 15.2 % (ref 11.5–15.5)
WBC: 10.5 10*3/uL (ref 4.0–10.5)
nRBC: 0 % (ref 0.0–0.2)

## 2018-10-07 LAB — PROTIME-INR
INR: 1.2 (ref 0.8–1.2)
Prothrombin Time: 15.1 seconds (ref 11.4–15.2)

## 2018-10-07 LAB — LACTIC ACID, PLASMA
Lactic Acid, Venous: 2.3 mmol/L (ref 0.5–1.9)
Lactic Acid, Venous: 1.4 mmol/L (ref 0.5–1.9)

## 2018-10-07 MED ORDER — SODIUM CHLORIDE 0.9 % IV BOLUS
500.0000 mL | Freq: Once | INTRAVENOUS | Status: AC
  Administered 2018-10-07: 500 mL via INTRAVENOUS

## 2018-10-07 NOTE — ED Notes (Signed)
Pt leaving via stretcher [place], [name], RN called for up date,   - meds in bag for [name] [facility] for [name], [place] to pick up  - pt cell phone and shoes in bag given to Barbaraann Boys  - pt leaving with given and applied lip and mouth moisturizer  - last known meal between breakfast and lunch and morning meds (in include blood thinners) were taken

## 2018-10-07 NOTE — ED Notes (Signed)
CRITICAL LAB: LACTIC is 2.3, [employer], Dr. Jacqualine Code notified, orders received

## 2018-10-07 NOTE — ED Notes (Addendum)
Pt lips cracked and mouth appears without moisture, diet soda given and warm blankets applied as requested  Pt disoriented as to the date and recent sequence of events - reports visit to ED yesterday for fall actually earlier this week with PCP

## 2018-10-07 NOTE — ED Notes (Signed)
Patient transported to CT 

## 2018-10-07 NOTE — ED Notes (Addendum)
spoke to Daughter, [name], and husband, husband requests that [name], RN, [facility] RN supervisor,   [name] reports: - pt is legally blind - 2 fall this week and neighbor that found pt reported that pt had garbled speech and was dragging a leg - hx of ovarian CA, AFIB, and MI with stent placement

## 2018-10-07 NOTE — Telephone Encounter (Signed)
Call returned, I spoke with patient and her husband. Per Dr. Janese Banks with it being late in the clinic day and unable to provide [facility] this late in the afternoon patient should be evaluated in the ER. Patient and husband are agreeable to patient being evaluated in the ER. I also attempted to call patients daughter [name] to update but was unable to reach her or leave vm at this time.

## 2018-10-07 NOTE — Telephone Encounter (Signed)
Patients daughter [name] called to report patient is experiencing worsening symptoms. Patient is experiencing uncontrolled pain, worsening neuropathy. Patients daughter reports she has fallen today. Patient also feels dehydrated and does not think she is able to take in adequate fluids at this time. Secure health message has been sent to Dr. Janese Banks and team, awaiting response for recommendations.

## 2018-10-07 NOTE — ED Triage Notes (Signed)
Patient is here today for weakness and fall X2 days ago. Also c/o trouble ambulating. PCP called patient and told her to come to ER. Pt has ovarian cancer. Chronic pain. Patient reports having out patient Xrays done yesterday.

## 2018-10-07 NOTE — ED Provider Notes (Signed)
[facility] Emergency Department Provider Note   ____________________________________________   First MD Initiated Contact with Patient 10/07/18 1924     (approximate)  I have reviewed the triage vital signs and the nursing notes.   HISTORY  Chief Complaint Fall and Weakness    HPI Margaret [name] is a 78 y.o. female here for evaluation for fatigue and weakness   Patient relates that she had a fall a couple days ago.  She also been feeling very weak and tired.  She is not been eating too well and she is on chemotherapy for ovarian cancer.  She did have some x-rays performed by her primary [facility], she reports those were reported to be okay as far she knows.  She reports and relates that she is continues to be very fatigued.  She has had a couple falls in the last few days.  She saw her primary care doctor yesterday for falls.  And she reports she just continues to be so weak she cannot walk.  Feels very dehydrated.  No fevers or chills.  No nausea vomiting.  No chest pain or trouble breathing.  Mouth feels very dry.  She reports her daughter thought she needed to come to the hospital and possibly stay  Past Medical History:  Diagnosis Date  . Acetabular fracture ([place])   . Anxiety and depression   . Bartholin cyst   . CAD (coronary artery disease)   . Cancer ([place])    mouth cyst was cancer many years ago   . Cellulitis of right leg 2005  . CHF (congestive heart failure) ([place])   . Colon polyp   . Complication of anesthesia   . Diabetes mellitus without complication ([place])   . DJD (degenerative joint disease)   . Dyspnea on exertion   . Dysrhythmia    PAF  . GERD (gastroesophageal reflux disease)   . Hyperlipidemia   . Hypertension   . Morbid obesity ([place])   . Myocardial infarction ([place]) 06/14/2017  . Ovarian cancer ([place])   . PAF (paroxysmal atrial fibrillation) ([place])    a. diagnosed 7/19; b. CHADS2VASc 8; c. Eliquis  . PONV (postoperative  nausea and vomiting)   . Presence of stent in right coronary artery 02/08/12   2.5x18 Xience distal RCA  . Right shoulder pain   . Stroke ([place]) 2017  . Vaginal prolapse     Patient Active Problem List   Diagnosis Date Noted  . Right ovarian cyst 08/12/2018  . Accelerated hypertension 07/31/2018  . Elevated lactic acid level 07/31/2018  . Hypertensive urgency 07/31/2018  . Goals of care, counseling/discussion 07/22/2018  . Primary high grade serous adenocarcinoma of ovary ([place]) 07/13/2018  . Controlled type 2 diabetes mellitus without complication, without long-term current use of insulin ([place]) 02/02/2018  . Bradycardia 01/13/2018  . Essential hypertension 11/04/2017  . Other hyperlipidemia 11/04/2017  . Hx of gastric bypass 11/04/2017  . NSTEMI (non-ST elevated myocardial infarction) ([place]) 06/14/2017  . History of ischemic right PCA stroke 03/27/2016  . Hemianopia, homonymous, left 01/31/2016  . Stroke ([place]) 11/06/2015  . Arthritis of knee, degenerative 05/04/2013  . Diastolic dysfunction 53/61/4431  . CAD S/P percutaneous coronary angioplasty   . Morbid obesity ([place])   . Right shoulder pain   . Anxiety and depression   . Dyspnea on exertion   . Cellulitis of right leg     Past Surgical History:  Procedure Laterality Date  . ABDOMINAL HYSTERECTOMY     supracervical  abdominal w/removal tubes &/or ovaries  . BREAST BIOPSY  1989  . CHOLECYSTECTOMY    . COLONOSCOPY    . COLONOSCOPY WITH PROPOFOL N/A 06/21/2018   Procedure: COLONOSCOPY WITH PROPOFOL;  Surgeon: Lollie Sails, MD;  Location: [facility] ENDOSCOPY;  Service: Endoscopy;  Laterality: N/A;  . CORONARY STENT INTERVENTION N/A 06/14/2017   Procedure: CORONARY STENT INTERVENTION;  Surgeon: Wellington Hampshire, MD;  Location: [place] CV LAB;  Service: Cardiovascular;  Laterality: N/A;  . ESOPHAGOGASTRODUODENOSCOPY N/A 06/21/2018   Procedure: ESOPHAGOGASTRODUODENOSCOPY (EGD);  Surgeon: Lollie Sails, MD;   Location: [facility] ENDOSCOPY;  Service: Endoscopy;  Laterality: N/A;  . FACIAL COSMETIC SURGERY     face/neck lift  . FRACTURE SURGERY    . GASTRIC BYPASS  2008  . JOINT REPLACEMENT    . KNEE SURGERY     left knee arthroscopic  . LAPAROSCOPIC BILATERAL SALPINGO OOPHERECTOMY Bilateral 06/24/2018   Procedure: LAPAROSCOPIC BILATERAL SALPINGO OOPHORECTOMY;  Surgeon: Schermerhorn, Gwen Her, MD;  Location: [facility] ORS;  Service: Gynecology;  Laterality: Bilateral;  . LEFT HEART CATH AND CORONARY ANGIOGRAPHY N/A 06/14/2017   Procedure: LEFT HEART CATH AND CORONARY ANGIOGRAPHY;  Surgeon: Wellington Hampshire, MD;  Location: [place] CV LAB;  Service: Cardiovascular;  Laterality: N/A;  . LEFT HEART CATH AND CORONARY ANGIOGRAPHY N/A 06/14/2017   Procedure: LEFT HEART CATH AND CORONARY ANGIOGRAPHY;  Surgeon: Wellington Hampshire, MD;  Location: [place] CV LAB;  Service: Cardiovascular;  Laterality: N/A;  . LYSIS OF ADHESION  06/24/2018   Procedure: LYSIS OF ADHESION;  Surgeon: Ouida Sills Gwen Her, MD;  Location: [facility] ORS;  Service: Gynecology;;  . Crisoforo Oxford   left hip fracture/repair and left arm fracture  . OTHER SURGICAL HISTORY  1986   hysterectomy  . PORTA CATH INSERTION N/A 07/26/2018   Procedure: PORTA CATH INSERTION;  Surgeon: Katha Cabal, MD;  Location: [place] CV LAB;  Service: Cardiovascular;  Laterality: N/A;  . right knee  late 90's   arthroscopic  . ROUX-EN-Y PROCEDURE    . SALIVARY GLAND SURGERY    . TOTAL HIP ARTHROPLASTY  2004   left    Prior to Admission medications   Medication Sig Start Date End Date Taking? Authorizing Provider  amLODipine (NORVASC) 2.5 MG tablet Take 1 tablet (2.5 mg total) by mouth daily. 04/08/18 04/03/19 Yes Martinique, Peter M, MD  apixaban (ELIQUIS) 5 MG TABS tablet Take 1 tablet (5 mg total) by mouth 2 (two) times daily. 01/14/18  Yes Demetrios Loll, MD  atorvastatin (LIPITOR) 80 MG tablet Take 80 mg by mouth at bedtime.   Yes [provider]   clopidogrel (PLAVIX) 75 MG tablet Take 1 tablet (75 mg total) by mouth daily. 01/15/18  Yes Demetrios Loll, MD  ezetimibe (ZETIA) 10 MG tablet TAKE 1 TABLET(10 MG) BY MOUTH DAILY Patient taking differently: Take 10 mg by mouth daily.  03/09/18  Yes Kilroy, Luke K, PA-C  losartan (COZAAR) 100 MG tablet Take 100 mg by mouth daily.   Yes [provider]  metoprolol tartrate (LOPRESSOR) 25 MG tablet Take 25 mg by mouth 2 (two) times daily.   Yes [provider]  omeprazole (PRILOSEC) 20 MG capsule Take 20 mg by mouth daily.   Yes [provider]  potassium chloride (K-DUR) 10 MEQ tablet Take 10 mEq by mouth daily.   Yes [provider]  sertraline (ZOLOFT) 100 MG tablet Take 100 mg by mouth daily.    Yes [provider]  cyanocobalamin (,VITAMIN B-12,) 1000 MCG/ML injection Inject 1,000 mcg into the muscle every 30 (thirty) days.    [provider]  cyclobenzaprine (FLEXERIL) 10 MG tablet Take 10 mg by mouth 2 (two) times daily as needed for spasms or pain. 07/22/18   [provider]  dexamethasone (DECADRON) 4 MG tablet Take 2 tablets (8 mg total) by mouth daily. Start the day after chemotherapy for 2 days. 08/08/18   Sindy Guadeloupe, MD  ergocalciferol (VITAMIN D2) 1.25 MG (50000 UT) capsule Take 50,000 Units by mouth every Monday.    [provider]  ferrous sulfate 325 (65 FE) MG tablet Take 325 mg by mouth daily with breakfast.    [provider]  HYDROcodone-acetaminophen (NORCO/VICODIN) 5-325 MG tablet Take 1 tablet by mouth at bedtime as needed for moderate pain. 07/31/18   Henreitta Leber, MD  lidocaine-prilocaine (EMLA) cream Apply to affected area once 07/22/18   Sindy Guadeloupe, MD  LORazepam (ATIVAN) 0.5 MG tablet Take 1 tablet (0.5 mg total) by mouth every 6 (six) hours as needed (Nausea or vomiting). 07/22/18   Sindy Guadeloupe, MD  Multiple Vitamin (MULTIVITAMIN WITH MINERALS) TABS tablet Take 1 tablet by mouth daily.  01/14/18   Demetrios Loll, MD  Multiple Vitamins-Minerals (OCUVITE PO) Take 1 capsule by mouth daily.    [provider]  nitroGLYCERIN (NITROSTAT) 0.4 MG SL tablet Place 1 tablet (0.4 mg total) under the tongue every 5 (five) minutes as needed for chest pain. 06/16/17   Demetrios Loll, MD  ondansetron (ZOFRAN) 4 MG tablet Take 4 mg by mouth every 8 (eight) hours as needed for nausea or vomiting.    [provider]  potassium chloride SA (K-DUR,KLOR-CON) 20 MEQ tablet Take 1 tablet (20 mEq total) by mouth daily. Patient not taking: Reported on 10/07/2018 09/29/18   Sindy Guadeloupe, MD  prochlorperazine (COMPAZINE) 10 MG tablet Take 1 tablet (10 mg total) by mouth every 6 (six) hours as needed (Nausea or vomiting). 07/22/18   Sindy Guadeloupe, MD  protein supplement shake (PREMIER PROTEIN) LIQD Take 325 mLs (11 oz total) by mouth 2 (two) times daily between meals. Patient not taking: Reported on 09/29/2018 01/14/18   Demetrios Loll, MD  rosuvastatin (CRESTOR) 40 MG tablet Take 40 mg by mouth at bedtime.    [provider]  senna-docusate (SENNA S) 8.6-50 MG tablet Take 1 tablet by mouth daily.     [provider]    Allergies Erythromycin base and Tape  Family History  Problem Relation Age of Onset  . Heart attack Father        MI age 44 and 62  . Heart attack Sister 61       cabg; deceased 73  . Diabetes Sister   . Hypertension Sister   . Heart attack Brother 41       cabg; deceased 70s  . Diabetes Brother   . Colon cancer Mother 55       deceased 83  . Diabetes Maternal Grandmother   . Cancer Maternal Grandfather        unknown type; deceased  . Hypertension Daughter   . Thyroid disease Daughter   . Breast cancer Neg Hx     Social History Social History   Tobacco Use  . Smoking status: Former Smoker    Packs/day: 2.00    Years: 26.00    Pack years: 52.00    Types: Cigarettes    Last attempt to quit: 06/22/1984  Years since quitting: 34.3  . Smokeless  tobacco: Never Used  Substance Use Topics  . Alcohol use: Yes    Alcohol/week: 0.0 standard drinks    Frequency: Never    Comment: rare- less than once a month  . Drug use: No    Review of Systems Constitutional: No fever/chills is feels very fatigued and weak all over Eyes: No visual changes. ENT: No sore throat.  Very dry. Cardiovascular: Denies chest pain. Respiratory: Denies shortness of breath. Gastrointestinal: No abdominal pain.   Genitourinary: Negative for dysuria. Musculoskeletal: Negative for back pain.  Her left hip was sore a couple days ago but that is better now. Skin: Negative for rash. Neurological: Negative for headaches, areas of focal weakness or numbness.  Does not think she hit her head when she fell.    ____________________________________________   PHYSICAL EXAM:  VITAL SIGNS: ED Triage Vitals  Enc Vitals Group     BP 10/07/18 1822 97/70     Pulse Rate 10/07/18 1822 (!) 105     Resp 10/07/18 1822 (!) 22     Temp 10/07/18 1822 97.6 F (36.4 C)     Temp Source 10/07/18 1822 Oral     SpO2 10/07/18 1821 100 %     Weight 10/07/18 1822 152 lb 3.2 oz (69 kg)     Height 10/07/18 1822 5\' 8"  (1.727 m)     Head Circumference --      Peak Flow --      Pain Score 10/07/18 1822 7     Pain Loc --      Pain Edu? --      Excl. in [place]? --     Constitutional: Alert and oriented.  Chronically ill-appearing, fatigued but in no acute distress.  She is very pleasant. Eyes: Conjunctivae are normal. Head: Atraumatic. Nose: No congestion/rhinnorhea. Mouth/Throat: Mucous membranes are bone dry. Neck: No stridor.  Cardiovascular: Slightly tachycardic rate, regular rhythm. Grossly normal heart sounds.  Good peripheral circulation. Respiratory: Normal respiratory effort.  No retractions. Lungs CTAB. Gastrointestinal: Soft and nontender. No distention. Musculoskeletal: No lower extremity tenderness nor edema.  Able to range her extremities well without any focal pain  or discomfort.  No pain or discomfort with axial loading hips bilateral Neurologic:  Normal speech and language. No gross focal neurologic deficits are appreciated.  Skin:  Skin is warm, dry and intact. No rash noted. Psychiatric: Mood and affect are normal. Speech and behavior are normal.  ____________________________________________   LABS (all labs ordered are listed, but only abnormal results are displayed)  Labs Reviewed  BASIC METABOLIC PANEL - Abnormal; Notable for the following components:      Result Value   CO2 17 (*)    BUN 24 (*)    Creatinine, Ser 1.02 (*)    GFR calc non Af Amer 52 (*)    Anion gap 17 (*)    All other components within normal limits  CBC - Abnormal; Notable for the following components:   RBC 3.59 (*)    Hemoglobin 10.9 (*)    HCT 33.2 (*)    Platelets 128 (*)    All other components within normal limits  LACTIC ACID, PLASMA - Abnormal; Notable for the following components:   Lactic Acid, Venous 2.3 (*)    All other components within normal limits  CULTURE, BLOOD (ROUTINE X 2)  CULTURE, BLOOD (ROUTINE X 2)  LACTIC ACID, PLASMA  PROTIME-INR  URINALYSIS, COMPLETE (UACMP) WITH MICROSCOPIC   ____________________________________________  EKG  Reviewed enterotomy at 1825 Heart rate 105 PR 170 QRS 99 QTc 450 Sinus tachycardia, left ventricular hypertrophy.  No obvious acute ischemic abnormality, difficult to exclude though in the setting of her LVH which is suspected ____________________________________________  RADIOLOGY  Dg Chest 1 View  Result Date: 10/07/2018 CLINICAL DATA:  Weakness for 2 days EXAM: CHEST  1 VIEW COMPARISON:  CT chest 07/19/2018 FINDINGS: There is a right-sided Port-A-Cath with the tip projecting over the SVC. There is no focal parenchymal opacity. There is no pleural effusion or pneumothorax. The heart and mediastinal contours are unremarkable. The osseous structures are unremarkable. IMPRESSION: No active disease.  Electronically Signed   By: Kathreen Devoid   On: 10/07/2018 19:38   Ct Head Wo Contrast  Result Date: 10/07/2018 CLINICAL DATA:  Multiple recent falls. Weakness. EXAM: CT HEAD WITHOUT CONTRAST TECHNIQUE: Contiguous axial images were obtained from the base of the skull through the vertex without intravenous contrast. COMPARISON:  06/14/2017 FINDINGS: Brain: Small volume subarachnoid hemorrhage is present in right frontal sulci, left temporal sulci, and left sylvian fissure. No subdural or epidural hematoma is identified. A large chronic right PCA infarct is again noted. Bilateral cerebral white matter hypodensities are similar to the prior study and nonspecific but compatible with mild chronic small vessel ischemic disease. A chronic lacunar infarct in the left corona radiata is unchanged. No acute infarct or intracranial mass effect is identified. Cerebral atrophy is unchanged. Vascular: Calcified atherosclerosis at the skull base. No hyperdense vessel. Skull: No fracture or suspicious osseous lesion. Sinuses/Orbits: Visualized paranasal sinuses and mastoid air cells are clear. Bilateral cataract extraction is noted. Other: None. IMPRESSION: 1. Small volume bilateral subarachnoid hemorrhage. 2. Chronic ischemic changes including a large right PCA infarct. Critical Value/emergent results were called by telephone at the time of interpretation on 10/07/2018 at 8:14 pm to Dr. Delman Kitten , who verbally acknowledged these results. Electronically Signed   By: Logan Bores M.D.   On: 10/07/2018 20:22     CT of the head personally discussed with radiology ____________________________________________   PROCEDURES  Procedure(s) performed: None  Procedures  Critical Care performed: YES  CRITICAL CARE Performed by: Delman Kitten   Total critical care time: 35 minutes  Critical care time was exclusive of separately billable procedures and treating other patients.  Critical care was necessary to treat or  prevent imminent or life-threatening deterioration.  Critical care was time spent personally by me on the following activities: development of treatment plan with patient and/or surrogate as well as nursing, discussions with consultants, evaluation of patient's response to treatment, examination of patient, obtaining history from patient or surrogate, ordering and performing treatments and interventions, ordering and review of laboratory studies, ordering and review of radiographic studies, pulse oximetry and re-evaluation of patient's condition.  ____________________________________________   INITIAL IMPRESSION / ASSESSMENT AND PLAN / ED COURSE  Pertinent labs & imaging results that were available during my care of the patient were reviewed by me and considered in my medical decision making (see chart for details).   Patient presents for evaluation after multiple falls, fell today as well.  Patient with some poor recollection.  Also on anticoagulant and antiplatelet agent.  Not complain of any acute cardiac vascular or neurologic symptoms but does seem slightly confused.  Will order a CT of the head to further evaluate, also obtain lab work and she appears dehydrated which we will plan to hydrate her.  Also evaluate and consider other causes such as infection as  to cause of her falls.  Clinical Course as of Oct 07 2146  Fri Oct 07, 2018  2003 [phone] Willella Harding is HCPOA)   [MQ]  2012 CT head discussed with Dr. Jeralyn Ruths (neurorad) at this time.    [MQ]  2017 Case discussed with patient and family, they are requesting transfer to [facility] for neurosurgical evaluation.  I informed them that she has intracranial hemorrhage, they understand this and need for transfer.  Neurosurgery has been paged at [facility] at this time   [MQ]  2037 Dr. Vertell Limber at neurosurgery advsies HOLD anticoagulation but after review advises NO REVERSAL product at this time. Accepted to Neuro ICU by Dr. Erline Levine.   [MQ]    Clinical Course User Index [MQ] Delman Kitten, MD   ----------------------------------------- 9:47 PM on 10/07/2018 -----------------------------------------  Patient resting comfortably, understanding and agreeable with plan to transfer to [facility].  Accepted by neurosurgery.  She remains awake and alert, normal mental status.  Her blood pressure now is reading somewhat hypertensive, but up to previous reads of demonstrated normal to slightly low blood pressure, at this point do not feel like we should strongly treat single elevated blood pressure, but probably does not need to be followed at this point quite closely.  Ongoing care signed Dr. Archie Balboa, patient anticipated to transfer to [name] neuro ICU shortly.  Awake and alert with some slight confusion.  Small intracranial hemorrhages noted, going to neurosurgical floor at [facility].   ____________________________________________   FINAL CLINICAL IMPRESSION(S) / ED DIAGNOSES  Intracranial hemorrhage Fall Fatigue Dehydration      Note:  This document was prepared using Dragon voice recognition software and may include unintentional dictation errors       Delman Kitten, MD 10/07/18 2150

## 2018-10-07 NOTE — ED Notes (Signed)
I have reviewed that chart and the EMTALA is complete at this time.

## 2018-10-08 ENCOUNTER — Inpatient Hospital Stay (HOSPITAL_COMMUNITY): Payer: PPO

## 2018-10-08 DIAGNOSIS — I609 Nontraumatic subarachnoid hemorrhage, unspecified: Secondary | ICD-10-CM

## 2018-10-08 LAB — MRSA PCR SCREENING: MRSA by PCR: NEGATIVE

## 2018-10-08 MED ORDER — VITAMIN D (ERGOCALCIFEROL) 1.25 MG (50000 UNIT) PO CAPS
50000.0000 [IU] | ORAL_CAPSULE | ORAL | Status: DC
  Administered 2018-10-10: 09:00:00 50000 [IU] via ORAL
  Filled 2018-10-08: qty 1

## 2018-10-08 MED ORDER — POTASSIUM CHLORIDE CRYS ER 20 MEQ PO TBCR
20.0000 meq | EXTENDED_RELEASE_TABLET | Freq: Every day | ORAL | Status: DC
  Administered 2018-10-08 – 2018-10-12 (×5): 20 meq via ORAL
  Filled 2018-10-08 (×5): qty 1

## 2018-10-08 MED ORDER — PROCHLORPERAZINE MALEATE 10 MG PO TABS
10.0000 mg | ORAL_TABLET | Freq: Four times a day (QID) | ORAL | Status: DC | PRN
  Filled 2018-10-08: qty 1

## 2018-10-08 MED ORDER — HYDROCODONE-ACETAMINOPHEN 5-325 MG PO TABS: 1.0000 | ORAL_TABLET | Freq: Every evening | ORAL | Status: DC | PRN

## 2018-10-08 MED ORDER — ONDANSETRON HCL 4 MG PO TABS: 4.0000 mg | ORAL_TABLET | Freq: Three times a day (TID) | ORAL | Status: DC | PRN

## 2018-10-08 MED ORDER — POTASSIUM CHLORIDE CRYS ER 10 MEQ PO TBCR: 10.0000 meq | EXTENDED_RELEASE_TABLET | Freq: Every day | ORAL | Status: DC

## 2018-10-08 MED ORDER — HYDROCODONE-ACETAMINOPHEN 5-325 MG PO TABS: 1.0000 | ORAL_TABLET | ORAL | Status: DC | PRN

## 2018-10-08 MED ORDER — ACETAMINOPHEN 325 MG PO TABS: 650.0000 mg | ORAL_TABLET | ORAL | Status: DC | PRN

## 2018-10-08 MED ORDER — EZETIMIBE 10 MG PO TABS
10.0000 mg | ORAL_TABLET | Freq: Every day | ORAL | Status: DC
  Administered 2018-10-08 – 2018-10-12 (×5): 10 mg via ORAL
  Filled 2018-10-08 (×5): qty 1

## 2018-10-08 MED ORDER — ENSURE MAX PROTEIN PO LIQD
11.0000 [oz_av] | Freq: Two times a day (BID) | ORAL | Status: DC
  Administered 2018-10-08 – 2018-10-12 (×7): 11 [oz_av] via ORAL
  Filled 2018-10-08 (×10): qty 330

## 2018-10-08 MED ORDER — ATORVASTATIN CALCIUM 80 MG PO TABS
80.0000 mg | ORAL_TABLET | Freq: Every day | ORAL | Status: DC
  Administered 2018-10-08 – 2018-10-11 (×5): 80 mg via ORAL
  Filled 2018-10-08 (×5): qty 1

## 2018-10-08 MED ORDER — SODIUM CHLORIDE 0.9 % IV SOLN
INTRAVENOUS | Status: DC
  Filled 2018-10-08 (×2): qty 1000

## 2018-10-08 MED ORDER — PANTOPRAZOLE SODIUM 40 MG PO TBEC
40.0000 mg | DELAYED_RELEASE_TABLET | Freq: Every day | ORAL | Status: DC
  Administered 2018-10-08 – 2018-10-12 (×5): 40 mg via ORAL
  Filled 2018-10-08 (×5): qty 1

## 2018-10-08 MED ORDER — AMLODIPINE BESYLATE 2.5 MG PO TABS
2.5000 mg | ORAL_TABLET | Freq: Every day | ORAL | Status: DC
  Administered 2018-10-08 – 2018-10-12 (×5): 2.5 mg via ORAL
  Filled 2018-10-08 (×5): qty 1

## 2018-10-08 MED ORDER — SENNOSIDES-DOCUSATE SODIUM 8.6-50 MG PO TABS
1.0000 | ORAL_TABLET | Freq: Every day | ORAL | Status: DC
  Administered 2018-10-08 – 2018-10-12 (×5): 1 via ORAL
  Filled 2018-10-08 (×5): qty 1

## 2018-10-08 MED ORDER — POTASSIUM CHLORIDE IN NACL 20-0.9 MEQ/L-% IV SOLN
INTRAVENOUS | Status: DC
  Administered 2018-10-08 – 2018-10-11 (×5): via INTRAVENOUS
  Filled 2018-10-08 (×8): qty 1000

## 2018-10-08 MED ORDER — LIDOCAINE-PRILOCAINE 2.5-2.5 % EX CREA
TOPICAL_CREAM | Freq: Once | CUTANEOUS | Status: DC
  Filled 2018-10-08: qty 5

## 2018-10-08 MED ORDER — FERROUS SULFATE 325 (65 FE) MG PO TABS
325.0000 mg | ORAL_TABLET | Freq: Every day | ORAL | Status: DC
  Administered 2018-10-08 – 2018-10-12 (×5): 325 mg via ORAL
  Filled 2018-10-08 (×5): qty 1

## 2018-10-08 MED ORDER — LOSARTAN POTASSIUM 50 MG PO TABS
100.0000 mg | ORAL_TABLET | Freq: Every day | ORAL | Status: DC
  Administered 2018-10-08 – 2018-10-12 (×5): 100 mg via ORAL
  Filled 2018-10-08 (×5): qty 2

## 2018-10-08 MED ORDER — CYCLOBENZAPRINE HCL 10 MG PO TABS: 10.0000 mg | ORAL_TABLET | Freq: Three times a day (TID) | ORAL | Status: DC | PRN

## 2018-10-08 MED ORDER — ONDANSETRON HCL 4 MG PO TABS: 4.0000 mg | ORAL_TABLET | Freq: Four times a day (QID) | ORAL | Status: DC | PRN

## 2018-10-08 MED ORDER — SERTRALINE HCL 100 MG PO TABS
100.0000 mg | ORAL_TABLET | Freq: Every day | ORAL | Status: DC
  Administered 2018-10-08 – 2018-10-12 (×5): 100 mg via ORAL
  Filled 2018-10-08: qty 2
  Filled 2018-10-08 (×4): qty 1

## 2018-10-08 MED ORDER — METOPROLOL TARTRATE 25 MG PO TABS
25.0000 mg | ORAL_TABLET | Freq: Two times a day (BID) | ORAL | Status: DC
  Administered 2018-10-08 – 2018-10-12 (×10): 25 mg via ORAL
  Filled 2018-10-08 (×10): qty 1

## 2018-10-08 MED ORDER — ONDANSETRON HCL 4 MG/2ML IJ SOLN
4.0000 mg | Freq: Four times a day (QID) | INTRAMUSCULAR | Status: DC | PRN
  Filled 2018-10-08: qty 2

## 2018-10-08 MED ORDER — CYANOCOBALAMIN 1000 MCG/ML IJ SOLN: 1000.0000 ug | INTRAMUSCULAR | Status: DC

## 2018-10-08 MED ORDER — ROSUVASTATIN CALCIUM 20 MG PO TABS: 40.0000 mg | ORAL_TABLET | Freq: Every day | ORAL | Status: DC

## 2018-10-08 MED ORDER — HYDROCODONE-ACETAMINOPHEN 5-325 MG PO TABS: 2.0000 | ORAL_TABLET | ORAL | Status: DC | PRN

## 2018-10-08 MED ORDER — LORAZEPAM 0.5 MG PO TABS: 0.5000 mg | ORAL_TABLET | Freq: Four times a day (QID) | ORAL | Status: DC | PRN

## 2018-10-08 MED ORDER — ADULT MULTIVITAMIN W/MINERALS CH
1.0000 | ORAL_TABLET | Freq: Every day | ORAL | Status: DC
  Administered 2018-10-08 – 2018-10-12 (×5): 1 via ORAL
  Filled 2018-10-08 (×5): qty 1

## 2018-10-08 MED ORDER — DEXAMETHASONE 4 MG PO TABS: 8.0000 mg | ORAL_TABLET | Freq: Every day | ORAL | Status: DC

## 2018-10-08 MED ORDER — NITROGLYCERIN 0.4 MG SL SUBL: 0.4000 mg | SUBLINGUAL_TABLET | SUBLINGUAL | Status: DC | PRN

## 2018-10-08 NOTE — Progress Notes (Signed)
Pt transferred to [place]. Report given to [employer]. Assessment stable. Belongings sent with pt including cell phone and dentures

## 2018-10-08 NOTE — H&P (Signed)
Reason for Consult:SAH s/p fall Referring Physician: Ralphine Hinks is an 78 y.o. female.  HPI: Margaret [name] is a 78 y.o. female transferred for subarachnoid hemorrhage after a fall  Patient relates that she had a fall a couple days ago.  She also been feeling very weak and tired.  She is not been eating too well and she is on chemotherapy for ovarian cancer.  She did have some x-rays performed by her primary [facility], she reports those were reported to be okay as far she knows.  She reports and relates that she is continues to be very fatigued.  She has had a couple falls in the last few days.  She saw her primary care doctor yesterday for falls.  And she reports she just continues to be so weak she cannot walk.  Feels very dehydrated.  No fevers or chills.  No nausea vomiting.  No chest pain or trouble breathing.  Mouth feels very dry.  She reports her daughter thought she needed to come to the hospital and possibly stay,  The patient does not recall hitting her head.  She does not remember the events surrounding her last fall.  Past Medical History:  Diagnosis Date  . Acetabular fracture ([place])   . Anxiety and depression   . Bartholin cyst   . CAD (coronary artery disease)   . Cancer ([place])    mouth cyst was cancer many years ago   . Cellulitis of right leg 2005  . CHF (congestive heart failure) ([place])   . Colon polyp   . Complication of anesthesia   . Diabetes mellitus without complication ([place])   . DJD (degenerative joint disease)   . Dyspnea on exertion   . Dysrhythmia    PAF  . GERD (gastroesophageal reflux disease)   . Hyperlipidemia   . Hypertension   . Morbid obesity ([place])   . Myocardial infarction ([place]) 06/14/2017  . Ovarian cancer ([place])   . PAF (paroxysmal atrial fibrillation) ([place])    a. diagnosed 7/19; b. CHADS2VASc 8; c. Eliquis  . PONV (postoperative nausea and vomiting)   . Presence of stent in right coronary artery 02/08/12   2.5x18 Xience distal  RCA  . Right shoulder pain   . Stroke ([place]) 2017  . Vaginal prolapse     Past Surgical History:  Procedure Laterality Date  . ABDOMINAL HYSTERECTOMY     supracervical abdominal w/removal tubes &/or ovaries  . BREAST BIOPSY  1989  . CHOLECYSTECTOMY    . COLONOSCOPY    . COLONOSCOPY WITH PROPOFOL N/A 06/21/2018   Procedure: COLONOSCOPY WITH PROPOFOL;  Surgeon: Lollie Sails, MD;  Location[facility] END[facility]COPY;  Service: Endoscopy;  Laterality: N/A;  . CORONARY STENT INTERVENTION N/A 06/14/2017   Procedure: CORONARY STENT INTERVENTION;  Surgeon: Wellington Hampshire, MD;  Location: [facility] CV LAB;  Service: Cardiovascular;  Laterality: N/A;  . ESOPHAGOGASTRODUODENOSCOPY N/A 06/21/2018   Procedure: ESOPHAGOGASTRODUODENOSCOPY (EGD);  Surgeon: Lollie Sails, MD;  Location: [facility] ENDOSCOPY;  Service: Endoscopy;  Laterality: N/A;  . FACIAL COSMETIC SURGERY     face/neck lift  . FRACTURE SURGERY    . GASTRIC BYPASS  2008  . JOINT REPLACEMENT    . KNEE SURGERY     left knee arthroscopic  . LAPAROSCOPIC BILATERAL SALPINGO OOPHERECTOMY Bilateral 06/24/2018   Procedure: LAPAROSCOPIC BILATERAL SALPINGO OOPHORECTOMY;  Surgeon: Schermerhorn, Gwen Her, MD;  Location: [facility] ORS;  Service: Gynecology;  Laterality: Bilateral;  . LEFT HEART CATH AND CORONARY ANGIOGRAPHY  N/A 06/14/2017   Procedure: LEFT HEART CATH AND CORONARY ANGIOGRAPHY;  Surgeon: Wellington Hampshire, MD;  Location: [place] CV LAB;  Service: Cardiovascular;  Laterality: N/A;  . LEFT HEART CATH AND CORONARY ANGIOGRAPHY N/A 06/14/2017   Procedure: LEFT HEART CATH AND CORONARY ANGIOGRAPHY;  Surgeon: Wellington Hampshire, MD;  Location: [place] CV LAB;  Service: Cardiovascular;  Laterality: N/A;  . LYSIS OF ADHESION  06/24/2018   Procedure: LYSIS OF ADHESION;  Surgeon: Ouida Sills Gwen Her, MD;  Location: [facility] ORS;  Service: Gynecology;;  . Crisoforo [place]   left hip fracture/repair and left arm fracture  . OTHER SURGICAL HISTORY   1986   hysterectomy  . PORTA CATH INSERTION N/A 07/26/2018   Procedure: PORTA CATH INSERTION;  Surgeon: Katha Cabal, MD;  Location: [facility] CV LAB;  Service: Cardiovascular;  Laterality: N/A;  . right knee  late 90's   arthroscopic  . ROUX-EN-Y PROCEDURE    . SALIVARY GLAND SURGERY    . TOTAL HIP ARTHROPLASTY  2004   left    Family History  Problem Relation Age of Onset  . Heart attack Father        MI age 52 and 76  . Heart attack Sister 75       cabg; deceased 46  . Diabetes Sister   . Hypertension Sister   . Heart attack Brother 53       cabg; deceased 83s  . Diabetes Brother   . Colon cancer Mother 78       deceased 32  . Diabetes Maternal Grandmother   . Cancer Maternal Grandfather        unknown type; deceased  . Hypertension Daughter   . Thyroid disease Daughter   . Breast cancer Neg Hx     Social History:  reports that she quit smoking about 34 years ago. Her smoking use included cigarettes. She has a 52.00 pack-year smoking history. She has never used smokeless tobacco. She reports current alcohol use. She reports that she does not use drugs.  Allergies:  Allergies  Allergen Reactions  . Erythromycin Base Nausea And Vomiting  . Tape Itching, Rash and Other (See Comments)    Also "band-aids"    Medications: I have reviewed the patient's current medications.  Results for orders placed or performed during the hospital encounter of 10/07/18 (from the past 48 hour(s))  Basic metabolic panel     Status: Abnormal   Collection Time: 10/07/18  6:25 PM  Result Value Ref Range   Sodium 141 135 - 145 mmol/L   Potassium 3.6 3.5 - 5.1 mmol/L   Chloride 107 98 - 111 mmol/L   CO2 17 (L) 22 - 32 mmol/L   Glucose, Bld 94 70 - 99 mg/dL   BUN 24 (H) 8 - 23 mg/dL   Creatinine, Ser 1.02 (H) 0.44 - 1.00 mg/dL   Calcium 9.7 8.9 - 10.3 mg/dL   GFR calc non Af Amer 52 (L) >60 mL/min   GFR calc Af Amer >60 >60 mL/min   Anion gap 17 (H) 5 - 15    Comment: Performed  at [facility], [address] [place], [place] [address]  CBC     Status: Abnormal   Collection Time: 10/07/18  6:25 PM  Result Value Ref Range   WBC 10.5 4.0 - 10.5 K/uL   RBC 3.59 (L) 3.87 - 5.11 MIL/uL   Hemoglobin 10.9 (L) 12.0 - 15.0 g/dL   HCT 33.2 (L)  36.0 - 46.0 %   MCV 92.5 80.0 - 100.0 fL   MCH 30.4 26.0 - 34.0 pg   MCHC 32.8 30.0 - 36.0 g/dL   RDW 15.2 11.5 - 15.5 %   Platelets 128 (L) 150 - 400 K/uL   nRBC 0.0 0.0 - 0.2 %    Comment: Performed at [facility], [address] [place], [place] [address]  Lactic acid, plasma     Status: Abnormal   Collection Time: 10/07/18  6:25 PM  Result Value Ref Range   Lactic Acid, Venous 2.3 (HH) 0.5 - 1.9 mmol/L    Comment: CRITICAL RESULT CALLED TO, READ BACK BY AND VERIFIED WITH NOEL WEBSTER 10/07/18 @ 1908  MLK Performed at [facility], [address] [place], [place] [address]   Protime-INR     Status: None   Collection Time: 10/07/18  6:25 PM  Result Value Ref Range   Prothrombin Time 15.1 11.4 - 15.2 seconds   INR 1.2 0.8 - 1.2    Comment: (NOTE) INR goal varies based on device and disease states. Performed at [facility], [address] [place], [place] [address]   Lactic acid, plasma     Status: None   Collection Time: 10/07/18  8:51 PM  Result Value Ref Range   Lactic Acid, Venous 1.4 0.5 - 1.9 mmol/L    Comment: Performed at [facility], [address] [place], [place] [address]    Dg Chest 1 View  Result Date: 10/07/2018 CLINICAL DATA:  Weakness for 2 days EXAM: CHEST  1 VIEW COMPARISON:  CT chest 07/19/2018 FINDINGS: There is a right-sided Port-A-Cath with the tip projecting over the SVC. There is no focal parenchymal opacity. There is no pleural effusion or pneumothorax. The heart and mediastinal contours are unremarkable. The osseous structures are unremarkable. IMPRESSION: No active disease. Electronically Signed   By: Kathreen Devoid   On: 10/07/2018  19:38   Ct Head Wo Contrast  Result Date: 10/07/2018 CLINICAL DATA:  Multiple recent falls. Weakness. EXAM: CT HEAD WITHOUT CONTRAST TECHNIQUE: Contiguous axial images were obtained from the base of the skull through the vertex without intravenous contrast. COMPARISON:  06/14/2017 FINDINGS: Brain: Small volume subarachnoid hemorrhage is present in right frontal sulci, left temporal sulci, and left sylvian fissure. No subdural or epidural hematoma is identified. A large chronic right PCA infarct is again noted. Bilateral cerebral white matter hypodensities are similar to the prior study and nonspecific but compatible with mild chronic small vessel ischemic disease. A chronic lacunar infarct in the left corona radiata is unchanged. No acute infarct or intracranial mass effect is identified. Cerebral atrophy is unchanged. Vascular: Calcified atherosclerosis at the skull base. No hyperdense vessel. Skull: No fracture or suspicious osseous lesion. Sinuses/Orbits: Visualized paranasal sinuses and mastoid air cells are clear. Bilateral cataract extraction is noted. Other: None. IMPRESSION: 1. Small volume bilateral subarachnoid hemorrhage. 2. Chronic ischemic changes including a large right PCA infarct. Critical Value/emergent results were called by telephone at the time of interpretation on 10/07/2018 at 8:14 pm to Dr. Delman Kitten , who verbally acknowledged these results. Electronically Signed   By: Logan Bores M.D.   On: 10/07/2018 20:22    Review of Systems - Negative except As per HPI    Blood pressure 138/74, pulse 84, temperature 98 F (36.7 C), temperature source Oral, resp. rate (!) 28, height 5\' 8"  (1.727 m), weight 67.7 kg, SpO2 100 %. Physical Exam  Constitutional: She is oriented  to person, place, and time. She appears well-developed and well-nourished.  HENT:  Head: Normocephalic and atraumatic. Hair is abnormal.  Eyes: Pupils are equal, round, and reactive to light. Conjunctivae and EOM are  normal.  Neck: Trachea normal, normal range of motion and phonation normal. Neck supple.  Respiratory: Effort normal. She has wheezes in the right lower field.  GI: Soft. Normal appearance. There is no abdominal tenderness.  Neurological: She is alert and oriented to person, place, and time. She has normal strength. No cranial nerve deficit or sensory deficit. GCS eye subscore is 4. GCS verbal subscore is 5. GCS motor subscore is 6.  No pronator drift.  Reportedly legally blind, but patient is able to read and count fingers.  She was told that she could drive.    Assessment/Plan: Patient has been falling.  She likely hit her head.  She has small areas of subarachnoid hemorrhage.  She has been a bit disoriented, but this appears to be clearing.  We will need to discuss risk/benefits of continued anticoagulation and antiplatelet therapy with Dr. Martinique.  In the meantime, we will hold her xeralto and plavix, observe in ICU, get repeat head CT in AM.  Peggyann Shoals, MD 10/08/2018, 12:14 AM

## 2018-10-08 NOTE — Evaluation (Signed)
Physical Therapy Evaluation Patient Details Name: Margaret [name] MRN: [id number] DOB: [birth year] Today's Date: 10/08/2018   History of Present Illness  78 y.o. female here for evaluation for fatigue and weakness with fall.  2 fall this week and neighbor that found pt reported that pt had garbled speech and was dragging a leg. Patient has small areas of subarachnoid hemorrhage.  Clinical Impression  Orders received for PT evaluation. Patient demonstrates deficits in functional mobility as indicated below. Will benefit from continued skilled PT to address deficits and maximize function. Will see as indicated and progress as tolerated.  At this time, patient with significant instability during ambulation attempt. Heavy physical assist required from therapist to mobilize safely. Per patient, this is NOT her baseline, but reports several falls this week. Given that patient is high fall risk, on anticoagulants, and demonstrates significant mobility impairments, find it reasonable that patient would need post acute rehabilitation follow. Recommending CIR consult at this time.    Follow Up Recommendations CIR    Equipment Recommendations  None recommended by PT    Recommendations for Other Services       Precautions / Restrictions Precautions Precautions: Fall      Mobility  Bed Mobility Overal bed mobility: Needs Assistance Bed Mobility: Rolling;Sidelying to Sit Rolling: Min guard Sidelying to sit: Min assist       General bed mobility comments: min assist for positioning, increased time to perform  Transfers Overall transfer level: Needs assistance Equipment used: 1 person hand held assist Transfers: Sit to/from Stand Sit to Stand: Min assist         General transfer comment: Min assist for stability during power up to stand from bed and from toilet with grab bar and assist  Ambulation/Gait Ambulation/Gait assistance: Mod assist Gait Distance (Feet): 16 Feet Assistive  device: 1 person hand held assist Gait Pattern/deviations: Ataxic;Shuffle;Staggering right Gait velocity: decreased   General Gait Details: patient extremely ataxic with mobility, heavy right lateral list with inability to self correct  Stairs            Wheelchair Mobility    Modified Rankin (Stroke Patients Only) Modified Rankin (Stroke Patients Only) Pre-Morbid Rankin Score: No symptoms Modified Rankin: Moderately severe disability     Balance Overall balance assessment: History of Falls;Needs assistance Sitting-balance support: Feet supported Sitting balance-Leahy Scale: Fair     Standing balance support: During functional activity Standing balance-Leahy Scale: Poor                               Pertinent Vitals/Pain Pain Assessment: Faces Faces Pain Scale: Hurts even more Pain Location: right hand (IV infiltration site) Pain Descriptors / Indicators: Sore Pain Intervention(s): Monitored during session    Home Living Family/patient expects to be discharged to:: Private residence Living Arrangements: Spouse/significant other Available Help at Discharge: Family Type of Home: House Home Access: Stairs to enter Entrance Stairs-Rails: Can reach both Entrance Stairs-Number of Steps: 4 Home Layout: One level Home Equipment: None      Prior Function Level of Independence: Needs assistance   Gait / Transfers Assistance Needed: ambulates with a rollator walkr per patient  ADL's / Homemaking Assistance Needed: assist with some household tasks from husband  Comments: patient reports she has a rollator walkers that she uses for mobility     Hand Dominance   Dominant Hand: Right    Extremity/Trunk Assessment   Upper Extremity Assessment Upper Extremity Assessment: Generalized weakness  Lower Extremity Assessment Lower Extremity Assessment: Generalized weakness       Communication   Communication: No difficulties  Cognition  Arousal/Alertness: Awake/alert Behavior During Therapy: WFL for tasks assessed/performed Overall Cognitive Status: No family/caregiver present to determine baseline cognitive functioning                                        General Comments      Exercises     Assessment/Plan    PT Assessment Patient needs continued PT services  PT Problem List Decreased strength;Decreased activity tolerance;Decreased balance;Decreased mobility;Decreased cognition;Decreased coordination;Decreased safety awareness;Pain       PT Treatment Interventions DME instruction;Stair training;Gait training;Functional mobility training;Therapeutic activities;Therapeutic exercise;Balance training;Neuromuscular re-education;Cognitive remediation;Patient/family education    PT Goals (Current goals can be found in the Care Plan section)  Acute Rehab PT Goals Patient Stated Goal: to not fall so much PT Goal Formulation: With patient Time For Goal Achievement: 10/22/18 Potential to Achieve Goals: Good    Frequency Min 4X/week   Barriers to discharge Decreased caregiver support(husband ias 62 yrs old and limited)      Co-evaluation               AM-PAC PT "6 Clicks" Mobility  Outcome Measure Help needed turning from your back to your side while in a flat bed without using bedrails?: A Little Help needed moving from lying on your back to sitting on the side of a flat bed without using bedrails?: A Lot Help needed moving to and from a bed to a chair (including a wheelchair)?: A Lot Help needed standing up from a chair using your arms (e.g., wheelchair or bedside chair)?: A Lot Help needed to walk in hospital room?: A Lot Help needed climbing 3-5 steps with a railing? : A Lot 6 Click Score: 13    End of Session Equipment Utilized During Treatment: Gait belt Activity Tolerance: Patient tolerated treatment well Patient left: in chair;with call bell/phone within reach;with chair alarm  set;with nursing/sitter in room Nurse Communication: Mobility status PT Visit Diagnosis: Unsteadiness on feet (R26.81);Difficulty in walking, not elsewhere classified (R26.2);History of falling (Z91.81);Repeated falls (R29.6)    Time: 1655-3748 PT Time Calculation (min) (ACUTE ONLY): 22 min   Charges:   PT Evaluation $PT Eval Moderate Complexity: 1 Mod          Alben Deeds, PT DPT  Board Certified Neurologic Specialist Acute Rehabilitation Services Pager [phone] Office [place] 10/08/2018, 1:13 PM

## 2018-10-08 NOTE — Progress Notes (Signed)
Subjective: Patient reports feeling OK  Objective: Vital signs in last 24 hours: Temp:  [97.6 F (36.4 C)-98.6 F (37 C)] 98.2 F (36.8 C) (04/18 0800) Pulse Rate:  [77-105] 78 (04/18 0800) Resp:  [14-28] 14 (04/18 0800) BP: (97-177)/(62-99) 126/99 (04/18 0800) SpO2:  [95 %-100 %] 98 % (04/18 0800) Weight:  [67.7 kg-69 kg] 67.7 kg (04/17 2311)  Intake/Output from previous day: 04/17 0701 - 04/18 0700 In: 375 [I.V.:375] Out: 400 [Urine:400] Intake/Output this shift: Total I/O In: 194.9 [P.O.:100; I.V.:94.9] Out: -   Physical Exam: Awake, alert, conversant.  No drift, no complaints.  Lab Results: Recent Labs    10/07/18 1825  WBC 10.5  HGB 10.9*  HCT 33.2*  PLT 128*   BMET Recent Labs    10/07/18 1825  NA 141  K 3.6  CL 107  CO2 17*  GLUCOSE 94  BUN 24*  CREATININE 1.02*  CALCIUM 9.7    Studies/Results: Dg Chest 1 View  Result Date: 10/07/2018 CLINICAL DATA:  Weakness for 2 days EXAM: CHEST  1 VIEW COMPARISON:  CT chest 07/19/2018 FINDINGS: There is a right-sided Port-A-Cath with the tip projecting over the SVC. There is no focal parenchymal opacity. There is no pleural effusion or pneumothorax. The heart and mediastinal contours are unremarkable. The osseous structures are unremarkable. IMPRESSION: No active disease. Electronically Signed   By: Kathreen Devoid   On: 10/07/2018 19:38   Ct Head Wo Contrast  Result Date: 10/08/2018 CLINICAL DATA:  78 y/o  F; subarachnoid hemorrhage after fall. EXAM: CT HEAD WITHOUT CONTRAST TECHNIQUE: Contiguous axial images were obtained from the base of the skull through the vertex without intravenous contrast. COMPARISON:  10/07/2018 CT head. FINDINGS: Brain: Stable small volume subarachnoid hemorrhage in left sylvian fissure, over left temporal lobe, and over right frontal lobe. No new intracranial hemorrhage identified. Stable chronic right PCA distribution infarction. Stable small chronic infarction within the left corona  radiata. Stable chronic microvascular ischemic changes and volume loss of the brain. No hydrocephalus or herniation. Vascular: Calcific atherosclerosis of carotid siphons. No hyperdense vessel identified. Skull: Normal. Negative for fracture or focal lesion. Sinuses/Orbits: No acute finding. Other: Bilateral intra-ocular lens replacement. IMPRESSION: 1. Stable small volume subarachnoid hemorrhage. No new intracranial hemorrhage identified. 2. Stable chronic microvascular ischemic changes, volume loss of the brain, and chronic infarctions. Electronically Signed   By: Kristine Garbe M.D.   On: 10/08/2018 06:15   Ct Head Wo Contrast  Result Date: 10/07/2018 CLINICAL DATA:  Multiple recent falls. Weakness. EXAM: CT HEAD WITHOUT CONTRAST TECHNIQUE: Contiguous axial images were obtained from the base of the skull through the vertex without intravenous contrast. COMPARISON:  06/14/2017 FINDINGS: Brain: Small volume subarachnoid hemorrhage is present in right frontal sulci, left temporal sulci, and left sylvian fissure. No subdural or epidural hematoma is identified. A large chronic right PCA infarct is again noted. Bilateral cerebral white matter hypodensities are similar to the prior study and nonspecific but compatible with mild chronic small vessel ischemic disease. A chronic lacunar infarct in the left corona radiata is unchanged. No acute infarct or intracranial mass effect is identified. Cerebral atrophy is unchanged. Vascular: Calcified atherosclerosis at the skull base. No hyperdense vessel. Skull: No fracture or suspicious osseous lesion. Sinuses/Orbits: Visualized paranasal sinuses and mastoid air cells are clear. Bilateral cataract extraction is noted. Other: None. IMPRESSION: 1. Small volume bilateral subarachnoid hemorrhage. 2. Chronic ischemic changes including a large right PCA infarct. Critical Value/emergent results were called by telephone at the time  of interpretation on 10/07/2018 at 8:14  pm to Dr. Delman Kitten , who verbally acknowledged these results. Electronically Signed   By: Logan Bores M.D.   On: 10/07/2018 20:22    Assessment/Plan: Head CT is stable.  Mobilize patient with PT.  I have called Dr. Martinique to discuss anticoagulation.  Transfer to floor.    LOS: 1 day    Peggyann Shoals, MD 10/08/2018, 8:53 AM

## 2018-10-08 NOTE — Progress Notes (Signed)
Rehab Admissions Coordinator Note:  Patient was screened by Cleatrice Burke for appropriateness for an Inpatient Acute Rehab Consult per PT rec.  At this time, we are recommending Inpatient Rehab consult as well as OT eval. Please place order if you agree.   Marrianne, Sica RN MSN 10/08/2018, 2:16 PM  I can be reached at [phone].

## 2018-10-09 MED ORDER — LABETALOL HCL 5 MG/ML IV SOLN: 10.0000 mg | INTRAVENOUS | Status: DC | PRN

## 2018-10-09 MED ORDER — HYDRALAZINE HCL 20 MG/ML IJ SOLN
5.0000 mg | INTRAMUSCULAR | Status: DC | PRN
  Administered 2018-10-09: 23:00:00 10 mg via INTRAVENOUS
  Administered 2018-10-10 – 2018-10-11 (×3): 20 mg via INTRAVENOUS
  Filled 2018-10-09 (×4): qty 1

## 2018-10-09 NOTE — Progress Notes (Signed)
Physical Therapy Treatment Patient Details Name: Margaret [name] MRN: [id number] DOB: 09-Aug-1940 Today's Date: 10/09/2018    History of Present Illness 78 y.o. female here for evaluation for fatigue and weakness with fall.  2 fall this week and neighbor that found pt reported that pt had garbled speech and was dragging a leg. Patient has small areas of subarachnoid hemorrhage.    PT Comments    Patient seen for activity progression. Patient extremely limited this session but unable to truly indicate reasons why other than "I just feel horrible". Patient denies pain when asked but grimaces and moans throughout session. Assisted patient into bathroom for hygiene, after using the bathroom and washing hands at counter, patient stating that further that she doesn't feel well and needs to lay back down. VSS throughout session. Of note, patient with what appears to be dry heaves at end of session but denies nausea. Assisted patient back to bed, nsg aware.  Follow Up Recommendations  CIR     Equipment Recommendations  None recommended by PT    Recommendations for Other Services       Precautions / Restrictions Precautions Precautions: Fall Restrictions Weight Bearing Restrictions: No    Mobility  Bed Mobility Overal bed mobility: Needs Assistance Bed Mobility: Rolling;Sidelying to Sit;Sit to Supine Rolling: Min guard Sidelying to sit: Min assist   Sit to supine: Min assist   General bed mobility comments: Min assist for trunk elevation and elevation of legs to return to bed  Transfers Overall transfer level: Needs assistance Equipment used: Rolling walker (2 wheeled) Transfers: Sit to/from Stand Sit to Stand: Min assist         General transfer comment: Min assist to power up to standing with use of RW today  Ambulation/Gait Ambulation/Gait assistance: Min assist Gait Distance (Feet): 12 Feet Assistive device: Rolling walker (2 wheeled) Gait Pattern/deviations:  Ataxic;Shuffle;Staggering right Gait velocity: decreased   General Gait Details: patient extremely ataxic with mobility, heavy right lateral list with inability to self correct   Stairs             Wheelchair Mobility    Modified Rankin (Stroke Patients Only) Modified Rankin (Stroke Patients Only) Pre-Morbid Rankin Score: No symptoms Modified Rankin: Moderately severe disability     Balance Overall balance assessment: History of Falls;Needs assistance Sitting-balance support: Feet supported Sitting balance-Leahy Scale: Fair     Standing balance support: During functional activity Standing balance-Leahy Scale: Poor                              Cognition Arousal/Alertness: Awake/alert Behavior During Therapy: Flat affect Overall Cognitive Status: No family/caregiver present to determine baseline cognitive functioning                                        Exercises      General Comments        Pertinent Vitals/Pain Pain Assessment: Faces Faces Pain Scale: Hurts even more Pain Location: unknown Pain Descriptors / Indicators: Moaning Pain Intervention(s): Monitored during session    Home Living                      Prior Function            PT Goals (current goals can now be found in the care plan section) Acute Rehab PT Goals  Patient Stated Goal: to not fall so much PT Goal Formulation: With patient Time For Goal Achievement: 10/22/18 Potential to Achieve Goals: Good Progress towards PT goals: Not progressing toward goals - comment(patient feeling very unwell )    Frequency    Min 4X/week      PT Plan Current plan remains appropriate    Co-evaluation              AM-PAC PT "6 Clicks" Mobility   Outcome Measure  Help needed turning from your back to your side while in a flat bed without using bedrails?: A Little Help needed moving from lying on your back to sitting on the side of a flat bed  without using bedrails?: A Lot Help needed moving to and from a bed to a chair (including a wheelchair)?: A Lot Help needed standing up from a chair using your arms (e.g., wheelchair or bedside chair)?: A Lot Help needed to walk in hospital room?: A Lot Help needed climbing 3-5 steps with a railing? : A Lot 6 Click Score: 13    End of Session Equipment Utilized During Treatment: Gait belt Activity Tolerance: Patient tolerated treatment well Patient left: in bed;with call bell/phone within reach;with bed alarm set Nurse Communication: Mobility status PT Visit Diagnosis: Unsteadiness on feet (R26.81);Difficulty in walking, not elsewhere classified (R26.2);History of falling (Z91.81);Repeated falls (R29.6)     Time: 6861-6837 PT Time Calculation (min) (ACUTE ONLY): 17 min  Charges:  $Therapeutic Activity: 8-22 mins                     Alben Deeds, PT DPT  Board Certified Neurologic Specialist [place] Pager [phone] Office [place] 10/09/2018, 11:19 AM

## 2018-10-09 NOTE — Progress Notes (Signed)
Subjective: Patient reports feeling OK.  Wants Rehab.  Objective: Vital signs in last 24 hours: Temp:  [98.4 F (36.9 C)-98.7 F (37.1 C)] 98.5 F (36.9 C) (04/19 0823) Pulse Rate:  [65-83] 80 (04/19 0823) Resp:  [13-23] 16 (04/19 0823) BP: (129-171)/(54-82) 136/67 (04/19 0823) SpO2:  [96 %-100 %] 98 % (04/19 0823)  Intake/Output from previous day: 04/18 0701 - 04/19 0700 In: 1099.4 [P.O.:100; I.V.:999.4] Out: -  Intake/Output this shift: No intake/output data recorded.  Physical Exam: Awake, alert, conversant.  MAEW without drift.  Lab Results: Recent Labs    10/07/18 1825  WBC 10.5  HGB 10.9*  HCT 33.2*  PLT 128*   BMET Recent Labs    10/07/18 1825  NA 141  K 3.6  CL 107  CO2 17*  GLUCOSE 94  BUN 24*  CREATININE 1.02*  CALCIUM 9.7    Studies/Results: Dg Chest 1 View  Result Date: 10/07/2018 CLINICAL DATA:  Weakness for 2 days EXAM: CHEST  1 VIEW COMPARISON:  CT chest 07/19/2018 FINDINGS: There is a right-sided Port-A-Cath with the tip projecting over the SVC. There is no focal parenchymal opacity. There is no pleural effusion or pneumothorax. The heart and mediastinal contours are unremarkable. The osseous structures are unremarkable. IMPRESSION: No active disease. Electronically Signed   By: Kathreen Devoid   On: 10/07/2018 19:38   Ct Head Wo Contrast  Result Date: 10/08/2018 CLINICAL DATA:  78 y/o  F; subarachnoid hemorrhage after fall. EXAM: CT HEAD WITHOUT CONTRAST TECHNIQUE: Contiguous axial images were obtained from the base of the skull through the vertex without intravenous contrast. COMPARISON:  10/07/2018 CT head. FINDINGS: Brain: Stable small volume subarachnoid hemorrhage in left sylvian fissure, over left temporal lobe, and over right frontal lobe. No new intracranial hemorrhage identified. Stable chronic right PCA distribution infarction. Stable small chronic infarction within the left corona radiata. Stable chronic microvascular ischemic changes and  volume loss of the brain. No hydrocephalus or herniation. Vascular: Calcific atherosclerosis of carotid siphons. No hyperdense vessel identified. Skull: Normal. Negative for fracture or focal lesion. Sinuses/Orbits: No acute finding. Other: Bilateral intra-ocular lens replacement. IMPRESSION: 1. Stable small volume subarachnoid hemorrhage. No new intracranial hemorrhage identified. 2. Stable chronic microvascular ischemic changes, volume loss of the brain, and chronic infarctions. Electronically Signed   By: Kristine Garbe M.D.   On: 10/08/2018 06:15   Ct Head Wo Contrast  Result Date: 10/07/2018 CLINICAL DATA:  Multiple recent falls. Weakness. EXAM: CT HEAD WITHOUT CONTRAST TECHNIQUE: Contiguous axial images were obtained from the base of the skull through the vertex without intravenous contrast. COMPARISON:  06/14/2017 FINDINGS: Brain: Small volume subarachnoid hemorrhage is present in right frontal sulci, left temporal sulci, and left sylvian fissure. No subdural or epidural hematoma is identified. A large chronic right PCA infarct is again noted. Bilateral cerebral white matter hypodensities are similar to the prior study and nonspecific but compatible with mild chronic small vessel ischemic disease. A chronic lacunar infarct in the left corona radiata is unchanged. No acute infarct or intracranial mass effect is identified. Cerebral atrophy is unchanged. Vascular: Calcified atherosclerosis at the skull base. No hyperdense vessel. Skull: No fracture or suspicious osseous lesion. Sinuses/Orbits: Visualized paranasal sinuses and mastoid air cells are clear. Bilateral cataract extraction is noted. Other: None. IMPRESSION: 1. Small volume bilateral subarachnoid hemorrhage. 2. Chronic ischemic changes including a large right PCA infarct. Critical Value/emergent results were called by telephone at the time of interpretation on 10/07/2018 at 8:14 pm to Dr. Delman Kitten ,  who verbally acknowledged these  results. Electronically Signed   By: Logan Bores M.D.   On: 10/07/2018 20:22    Assessment/Plan: Transfer to Rehab when bed available.  I attempted to reach Dr. Martinique yesterday, but was unable to do so.  We will need to discuss risks/benefits of anticoagulation going forward with him.  Currently, these are held due to recent hemorrhage.    LOS: 2 days    Peggyann Shoals, MD 10/09/2018, 9:43 AM

## 2018-10-10 ENCOUNTER — Telehealth: Payer: Self-pay | Admitting: *Deleted

## 2018-10-10 NOTE — Social Work (Signed)
Aware of pt discussion with SLP, will f/u to provide support and resources to pt should she be amenable. Currently insurance auth pending for CIR.  Westley Hummer, MSW, [place] Work [phone]

## 2018-10-10 NOTE — Progress Notes (Signed)
Patient ID: Margaret [name], female   DOB: 11/21/1940, 78 y.o.   MRN: [id number] Patient is awake and alert no complaints except general fatigue.  Slight confusion but otherwise awake and alert moves all extremities well  Awaiting rehab placement

## 2018-10-10 NOTE — Telephone Encounter (Signed)
Called her daughter-she states that she had subarachnoid hemorrhage and they are working on having pt. trf to inpatient rehab in [place] for 1-2 weeks. She is waiting to see if she is approved. Wanted to know how many treatments she has had which is 4 and also when her next appt is and it was today . I did cancel it because I knew she was sent to ER and admitted. Dr. Janese Banks when do you want to see her and will be complete her treatments or not. That is what daughter wants to know.

## 2018-10-10 NOTE — Progress Notes (Signed)
Inpatient Rehabilitation Admissions Coordinator  Inpatient Rehab Consult received. I met with patient at the bedside for rehabilitation assessment. We discussed goals and expectations of an inpatient rehab admission.  patient in agreement bu asked me to contact her spouse to discuss which I did by phone. He prefers an inpt rehab admit . I have begun insurance approval for an inpt rehab admit. I also left pt's daughter a voicemail to discuss our rehab venue.  Danne Baxter, RN, MSN Rehab Admissions Coordinator [phone] 10/10/2018 12:16 PM

## 2018-10-10 NOTE — Consult Note (Signed)
   [facility] Inpatient Consult   10/10/2018  Margaret [name] 24-Sep-1940 [id number]    Patient screened for unplanned readmission and hospitalizations at 30% (extreme) with [employer] plan. Has 2 admits and 1 ED visit in the last 6 months. Patient had been outreached by [facility] telephonic CM in the past but currently not active.   Review of chart and recent transition of care team note states that patient has been recommended for Inpatient rehab (CIR) pending insurance authorization.   Noted that patient is currently listed as engaged [facility] patient. She will be followed by [facility] in the community with full case management services.   [facility] care management services not appropriate at this time.   Will sign off.   For questions and additional information, please contact:  Shanele Nissan A. Teralyn Mullins, BSN, RN-BC [facility] Liaison Cell: [phone]

## 2018-10-10 NOTE — H&P (Signed)
Physical Medicine and Rehabilitation Admission H&P    Chief complaint: Headache  HPI: Margaret [name] is a 78 year old right-handed female with history of CAD with stenting maintained on Plavix, diastolic congestive heart failure, diet-controlled diabetes mellitus, PAF maintained on Eliquis followed by Dr. Martinique as well as ovarian cancer maintained on chemotherapy.  Patient lives with spouse.  One level home 4 steps to entry.  Ambulates with a rolling walker.  Needed assist with some household tasks from husband.  History taken from chart review and patient.  She presented on 10/08/2018 after a fall few days PTA with increased lethargy.  No nausea vomiting.  Cranial CT scan reviewed, showing small bilateral subarachnoid hemorrhages, right greater than left.  Chronic ischemic changes including a large right PCA infarction.  Eliquis and Plavix were held due to [facility].  Neurosurgery follow-up Dr. Vertell Limber recommended conservative care with follow-up head CT.  Follow-up head CT on 10/08/2018 reviewed, stable.  Recommendations per cardiology, to continue to hold anticoagulation.  Tolerating a regular diet.  Therapy evaluations completed with recommendations of physical medicine rehab consult.  Please see preadmission assessment from earlier today as well.  Review of Systems  Constitutional: Positive for malaise/fatigue. Negative for chills and fever.  HENT: Negative for hearing loss.   Eyes: Negative for blurred vision and double vision.  Respiratory: Negative for cough.        Shortness of breath with exertion  Cardiovascular: Negative for chest pain and palpitations.  Gastrointestinal: Negative for heartburn, nausea and vomiting.       GERD  Genitourinary: Negative for dysuria, flank pain and hematuria.  Musculoskeletal: Positive for falls.  Skin: Negative for rash.  Neurological: Positive for dizziness and weakness. Negative for speech change.  Psychiatric/Behavioral: Positive for depression. The  patient has insomnia.        Anxiety  All other systems reviewed and are negative.  Past Medical History:  Diagnosis Date  . Acetabular fracture ([place])   . Anxiety and depression   . Bartholin cyst   . CAD (coronary artery disease)   . Cancer ([place])    mouth cyst was cancer many years ago   . Cellulitis of right leg 2005  . CHF (congestive heart failure) ([place])   . Colon polyp   . Complication of anesthesia   . Diabetes mellitus without complication ([place])   . DJD (degenerative joint disease)   . Dyspnea on exertion   . Dysrhythmia    PAF  . GERD (gastroesophageal reflux disease)   . Hyperlipidemia   . Hypertension   . Morbid obesity ([place])   . Myocardial infarction ([place]) 06/14/2017  . Ovarian cancer ([place])   . PAF (paroxysmal atrial fibrillation) ([place])    a. diagnosed 7/19; b. CHADS2VASc 8; c. Eliquis  . PONV (postoperative nausea and vomiting)   . Presence of stent in right coronary artery 02/08/12   2.5x18 Xience distal RCA  . Right shoulder pain   . Stroke ([place]) 2017  . Vaginal prolapse    Past Surgical History:  Procedure Laterality Date  . ABDOMINAL HYSTERECTOMY     supracervical abdominal w/removal tubes &/or ovaries  . BREAST BIOPSY  1989  . CHOLECYSTECTOMY    . COLONOSCOPY    . COLONOSCOPY WITH PROPOFOL N/A 06/21/2018   Procedure: COLONOSCOPY WITH PROPOFOL;  Surgeon: Lollie Sails, MD;  Location: [facility] ENDOSCOPY;  Service: Endoscopy;  Laterality: N/A;  . CORONARY STENT INTERVENTION N/A 06/14/2017   Procedure: CORONARY STENT INTERVENTION;  Surgeon: Wellington Hampshire,  MD;  Location: [facility] CV LAB;  Service: Cardiovascular;  Laterality: N/A;  . ESOPHAGOGASTRODUODENOSCOPY N/A 06/21/2018   Procedure: ESOPHAGOGASTRODUODENOSCOPY (EGD);  Surgeon: Lollie Sails, MD;  Location: [facility] ENDOSCOPY;  Service: Endoscopy;  Laterality: N/A;  . FACIAL COSMETIC SURGERY     face/neck lift  . FRACTURE SURGERY    . GASTRIC BYPASS  2008  . JOINT REPLACEMENT    . KNEE  SURGERY     left knee arthroscopic  . LAPAROSCOPIC BILATERAL SALPINGO OOPHERECTOMY Bilateral 06/24/2018   Procedure: LAPAROSCOPIC BILATERAL SALPINGO OOPHORECTOMY;  Surgeon: Schermerhorn, Gwen Her, MD;  Location: [facility] ORS;  Service: Gynecology;  Laterality: Bilateral;  . LEFT HEART CATH AND CORONARY ANGIOGRAPHY N/A 06/14/2017   Procedure: LEFT HEART CATH AND CORONARY ANGIOGRAPHY;  Surgeon: Wellington Hampshire, MD;  Location: [facility];  Service: Cardiovascular;  Laterality: N/A;  . LEFT HEART CATH AND CORONARY ANGIOGRAPHY N/A 06/14/2017   Procedure: LEFT HEART CATH AND CORONARY ANGIOGRAPHY;  Surgeon: Wellington Hampshire, MD;  Location: [facility];  Service: Cardiovascular;  Laterality: N/A;  . LYSIS OF ADHESION  06/24/2018   Procedure: LYSIS OF ADHESION;  Surgeon: Ouida Sills Gwen Her, MD;  Location: [facility] ORS;  Service: Gynecology;;  . Crisoforo [place]   left hip fracture/repair and left arm fracture  . OTHER SURGICAL HISTORY  1986   hysterectomy  . PORTA CATH INSERTION N/A 07/26/2018   Procedure: PORTA CATH INSERTION;  Surgeon: Katha Cabal, MD;  Location: [facility];  Service: Cardiovascular;  Laterality: N/A;  . right knee  late 90's   arthroscopic  . ROUX-EN-Y PROCEDURE    . SALIVARY GLAND SURGERY    . TOTAL HIP ARTHROPLASTY  2004   left   Family History  Problem Relation Age of Onset  . Heart attack Father        MI age 41 and 58  . Heart attack Sister 66       cabg; deceased 67  . Diabetes Sister   . Hypertension Sister   . Heart attack Brother 71       cabg; deceased 36s  . Diabetes Brother   . Colon cancer Mother 65       deceased 68  . Diabetes Maternal Grandmother   . Cancer Maternal Grandfather        unknown type; deceased  . Hypertension Daughter   . Thyroid disease Daughter   . Breast cancer Neg Hx    Social History:  reports that she quit smoking about 34 years ago. Her smoking use included cigarettes. She has a 52.00 pack-year smoking  history. She has never used smokeless tobacco. She reports current alcohol use. She reports that she does not use drugs. Allergies:  Allergies  Allergen Reactions  . Erythromycin Base Nausea And Vomiting  . Tape Itching, Rash and Other (See Comments)    Also "band-aids"   Medications Prior to Admission  Medication Sig Dispense Refill  . amLODipine (NORVASC) 2.5 MG tablet Take 1 tablet (2.5 mg total) by mouth daily. 90 tablet 3  . apixaban (ELIQUIS) 5 MG TABS tablet Take 1 tablet (5 mg total) by mouth 2 (two) times daily. 60 tablet 0  . atorvastatin (LIPITOR) 80 MG tablet Take 80 mg by mouth at bedtime.    . clopidogrel (PLAVIX) 75 MG tablet Take 1 tablet (75 mg total) by mouth daily. 30 tablet 0  . cyanocobalamin (,VITAMIN B-12,) 1000 MCG/ML injection Inject 1,000 mcg into the muscle every  30 (thirty) days.    . cyclobenzaprine (FLEXERIL) 10 MG tablet Take 10 mg by mouth 2 (two) times daily as needed for spasms or pain.    . ergocalciferol (VITAMIN D2) 1.25 MG (50000 UT) capsule Take 50,000 Units by mouth every Monday.    . ezetimibe (ZETIA) 10 MG tablet TAKE 1 TABLET(10 MG) BY MOUTH DAILY (Patient taking differently: Take 10 mg by mouth daily. ) 30 tablet 5  . HYDROcodone-acetaminophen (NORCO/VICODIN) 5-325 MG tablet Take 1 tablet by mouth at bedtime as needed for moderate pain. 30 tablet 0  . LORazepam (ATIVAN) 0.5 MG tablet Take 1 tablet (0.5 mg total) by mouth every 6 (six) hours as needed (Nausea or vomiting). 30 tablet 0  . losartan (COZAAR) 100 MG tablet Take 100 mg by mouth daily.    . metoprolol tartrate (LOPRESSOR) 25 MG tablet Take 25 mg by mouth 2 (two) times daily.    . Multiple Vitamin (MULTIVITAMIN WITH MINERALS) TABS tablet Take 1 tablet by mouth daily. 30 tablet 1  . Multiple Vitamins-Minerals (OCUVITE PO) Take 1 capsule by mouth daily.    . nitroGLYCERIN (NITROSTAT) 0.4 MG SL tablet Place 1 tablet (0.4 mg total) under the tongue every 5 (five) minutes as needed for chest  pain. 30 tablet 2  . omeprazole (PRILOSEC) 20 MG capsule Take 20 mg by mouth daily.    . ondansetron (ZOFRAN) 4 MG tablet Take 4 mg by mouth every 8 (eight) hours as needed for nausea or vomiting.    . potassium chloride (K-DUR) 10 MEQ tablet Take 10 mEq by mouth daily.    . prochlorperazine (COMPAZINE) 10 MG tablet Take 1 tablet (10 mg total) by mouth every 6 (six) hours as needed (Nausea or vomiting). 30 tablet 1  . sertraline (ZOLOFT) 100 MG tablet Take 100 mg by mouth daily.     Marland Kitchen dexamethasone (DECADRON) 4 MG tablet Take 2 tablets (8 mg total) by mouth daily. Start the day after chemotherapy for 2 days. 30 tablet 1  . ferrous sulfate 325 (65 FE) MG tablet Take 325 mg by mouth daily with breakfast.    . lidocaine-prilocaine (EMLA) cream Apply to affected area once 30 g 3  . potassium chloride SA (K-DUR,KLOR-CON) 20 MEQ tablet Take 1 tablet (20 mEq total) by mouth daily. (Patient not taking: Reported on 10/07/2018) 30 tablet 1  . protein supplement shake (PREMIER PROTEIN) LIQD Take 325 mLs (11 oz total) by mouth 2 (two) times daily between meals. (Patient not taking: Reported on 09/29/2018) 60 Can 0  . rosuvastatin (CRESTOR) 40 MG tablet Take 40 mg by mouth at bedtime.    . senna-docusate (SENNA S) 8.6-50 MG tablet Take 1 tablet by mouth daily.       Drug Regimen Review Drug regimen was reviewed and remains appropriate with no significant issues identified  Home: Home Living Family/patient expects to be discharged to:: Private residence Living Arrangements: Spouse/significant other Available Help at Discharge: Family, Available 24 hours/day Type of Home: House Home Access: Stairs to enter [employer] of Steps: 4 Entrance Stairs-Rails: Can reach both Home Layout: One level Bathroom Shower/Tub: Multimedia programmer: Programmer, systems: Yes Home Equipment: None  Lives With: Spouse   Functional History: Prior Function Level of Independence: Needs  assistance Gait / Transfers Assistance Needed: ambulates with a rollator walkr per patient ADL's / Homemaking Assistance Needed: Pt reports she is able to perform ADLs mod I, but spouse has been having to assist with IADLs  Comments:  patient reports she has a rollator walkers that she uses for mobility  Functional Status:  Mobility: Bed Mobility Overal bed mobility: Needs Assistance Bed Mobility: Sit to Supine Rolling: Min guard Sidelying to sit: Min assist Sit to supine: Min guard General bed mobility comments: increased time  Transfers Overall transfer level: Needs assistance Equipment used: Rolling walker (2 wheeled) Transfers: Sit to/from Stand, [employer] Transfers Sit to Stand: Min assist Stand pivot transfers: Min assist General transfer comment: assist to steady  Ambulation/Gait Ambulation/Gait assistance: Min assist Gait Distance (Feet): 70 Feet Assistive device: Rolling walker (2 wheeled) Gait Pattern/deviations: Shuffle, Staggering right, Drifts right/left, Narrow base of support General Gait Details: Patient with some improvements in overall stability and tolerated increased ambulation distance today with rest break. Continues to required hands on assist for balance and multi modal cues for positioning with RW Gait velocity: decreased Gait velocity interpretation: <1.31 ft/sec, indicative of household ambulator    ADL: ADL Overall ADL's : Needs assistance/impaired Eating/Feeding: Modified independent, Sitting Grooming: Wash/dry hands, Wash/dry face, Oral care, Set up, Sitting Upper Body Bathing: Minimal assistance, Sitting Lower Body Bathing: Moderate assistance, Sit to/from stand Upper Body Dressing : Set up, Sitting Lower Body Dressing: Moderate assistance, Sit to/from stand Lower Body Dressing Details (indicate cue type and reason): able to access feet, but requires assist to fully complete task  Toilet Transfer: Minimal assistance, Stand-pivot, BSC  Toileting- Clothing Manipulation and Hygiene: Minimal assistance, Sit to/from stand Functional mobility during ADLs: Minimal assistance  Cognition: Cognition Overall Cognitive Status: Impaired/Different from baseline Arousal/Alertness: Awake/alert Orientation Level: Oriented X4 Attention: Focused, Sustained Focused Attention: Appears intact Sustained Attention: Impaired Sustained Attention Impairment: Verbal complex Memory: Impaired Memory Impairment: Retrieval deficit, Decreased recall of new information(Immediate: 3/3; Delayed: 1/3; 0/2 with cues) Awareness: Impaired Awareness Impairment: Intellectual impairment Problem Solving: Impaired Problem Solving Impairment: Verbal complex(2/4) Cognition Arousal/Alertness: Awake/alert Behavior During Therapy: Flat affect Overall Cognitive Status: Impaired/Different from baseline Area of Impairment: Attention, Problem solving, Memory Current Attention Level: Selective Memory: Decreased short-term memory Problem Solving: Requires verbal cues General Comments: Pt is slow to process info requires cueing to recall info accurately   Physical Exam: Blood pressure (!) 142/74, pulse 79, temperature (!) 97.5 F (36.4 C), temperature source Oral, resp. rate 13, height 5\' 8"  (1.727 m), weight 67.7 kg, SpO2 98 %. Physical Exam  Vitals reviewed. Constitutional: She is oriented to person, place, and time. She appears well-developed and well-nourished.  HENT:  Head: Normocephalic and atraumatic.  Eyes: EOM are normal. Right eye exhibits no discharge. Left eye exhibits no discharge.  Respiratory: Effort normal. No respiratory distress.  GI: She exhibits no distension.  Musculoskeletal:     Comments: No edema or tenderness in extremities  Neurological: She is alert and oriented to person, place, and time.   Makes good eye contact with examiner.  Fair insight awareness of deficits.  Follows commands Motor: Grossly 4+/5 throughout  Skin: Skin is  warm and dry.  Psychiatric: She has a normal mood and affect. Her behavior is normal.    No results found for this or any previous visit (from the past 48 hour(s)). No results found.     Medical Problem List and Plan: 1.  Decreased functional mobility secondary to traumatic SAH  Admit to CIR 2.  Antithrombotics: -DVT/anticoagulation: SCDs  -antiplatelet therapy: Chronic Plavix and Eliquis held due to [facility] 3. Pain Management: Hydrocodone and Flexeril as needed 4. Mood: Zoloft 100 mg daily  -antipsychotic agents: N/A 5. Neuropsych: This patient is  capable of making decisions on her own behalf. 6. Skin/Wound Care: Routine skin checks 7. Fluids/Electrolytes/Nutrition: Routine in and outs.  BMP ordered for tomorrow morning. 8.  Hypertension.  Lopressor 25 mg twice daily, Cozaar 100 mg daily, Norvasc 2.5 mg daily.  Monitor with increased mobility. 9.  CAD with stenting.  Plavix currently on hold. 10.  Diastolic congestive heart failure.  Monitor for signs and symptoms of fluid overload. 11.  PAF.  Cardiac rate controlled.  Eliquis on hold.  Monitor heart rate with increased physical activity. 12.  Ovarian cancer.  Follow-up outpatient.chemotherapy currently on hold until rehabilitation completed for [facility] 13.  Diet-controlled diabetes mellitus.  Patient currently on a regular diet.  Monitor with increased activity. 14.  Hyperlipidemia.  Zetia/Lipitor 15.  Iron deficiency anemia.  Continue iron supplement.   Lavon Paganini Angiulli, PA-C 10/12/2018

## 2018-10-10 NOTE — Telephone Encounter (Signed)
Patients daughter, Jocelyn Lamer, left vm stating that pt is currently admitted, would like a call back to discuss.  [phone]

## 2018-10-10 NOTE — Progress Notes (Signed)
OT Cancellation Note  Patient Details Name: Margaret [name] MRN: [id number] DOB: 1941-02-23   Cancelled Treatment:    Reason Eval/Treat Not Completed: Fatigue/lethargy limiting ability to participate with pt requesting "to sleep", plan to re-attempt tomorrow.   Delight Stare, OT Acute Rehabilitation Services Pager [phone] Office [phone]   Delight Stare 10/10/2018, 4:55 PM

## 2018-10-10 NOTE — Progress Notes (Signed)
Chaplain initiated visit with patient after huddle.  When she stepped in the room and introduced herself, patient declined visit, saying "I'm trying to get some sleep right now."  Will be available if needed. Tamsen Snider Pager [phone].
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
## 2018-10-10 NOTE — Telephone Encounter (Signed)
I think I am going to stop her chemo at this point. She is getting weaker and developing more neuropathy.she may not tolerate more chemo. I knew about the [facility] transfer, SA[facility]. Let her complete her rehab and I will see her back in 3 weeks with labs: cbc with diff/cmp. We will get ct chest abdomen pelvis with contrast prior

## 2018-10-11 ENCOUNTER — Encounter (HOSPITAL_COMMUNITY): Payer: Self-pay | Admitting: *Deleted

## 2018-10-11 NOTE — Progress Notes (Signed)
Inpatient Rehabilitation Admissions Coordinator  I continue to await insurance authorization with [employer]. I will follow up tomorrow.  Danne Baxter, RN, MSN Rehab Admissions Coordinator [phone] 10/11/2018 4:50 PM

## 2018-10-11 NOTE — Progress Notes (Signed)
Subjective: Patient reports doing OK.  Awaiting Rehab.  Objective: Vital signs in last 24 hours: Temp:  [97.7 F (36.5 C)-98.5 F (36.9 C)] 98.5 F (36.9 C) (04/21 1505) Pulse Rate:  [70-80] 77 (04/21 1505) Resp:  [13-23] 18 (04/21 1505) BP: (144-175)/(71-97) 144/71 (04/21 1211) SpO2:  [98 %-100 %] 100 % (04/21 1505)  Intake/Output from previous day: 04/20 0701 - 04/21 0700 In: 978.8 [P.O.:240; I.V.:738.8] Out: -  Intake/Output this shift: No intake/output data recorded.  Physical Exam: Minimal headache.  Working on ambulation.  Lab Results: No results for input(s): WBC, HGB, HCT, PLT in the last 72 hours. BMET No results for input(s): NA, K, CL, CO2, GLUCOSE, BUN, CREATININE, CALCIUM in the last 72 hours.  Studies/Results: No results found.  Assessment/Plan: Transfer to Rehab when authorization is given.  Patient is doing well.     LOS: 4 days    Peggyann Shoals, MD 10/11/2018, 3:07 PM

## 2018-10-11 NOTE — Progress Notes (Signed)
Physical Therapy Treatment Patient Details Name: Thomas Rhude MRN: [id number] DOB: 1940/12/24 Today's Date: 10/11/2018    History of Present Illness 78 y.o. female here for evaluation for fatigue and weakness with fall.  2 fall this week and neighbor that found pt reported that pt had garbled speech and was dragging a leg. Patient has small areas of subarachnoid hemorrhage.    PT Comments    Patient seen for activity progression, still feeling unwell but improved compared to previous session. Spoke with patient regarding mobility concerns and generalized weakness. Patient receptive and appreciative for therapy efforts. Tolerated increased activity today but continues to require assist for balance and stability as well as some processing cues throughout session. Current recommendations for post acute rehabilitation remain appropriate. Will continue to see and progress as tolerated.   Follow Up Recommendations  CIR     Equipment Recommendations  None recommended by PT    Recommendations for Other Services       Precautions / Restrictions Precautions Precautions: Fall Restrictions Weight Bearing Restrictions: No    Mobility  Bed Mobility Overal bed mobility: Needs Assistance Bed Mobility: Rolling;Sidelying to Sit;Sit to Supine Rolling: Min guard Sidelying to sit: Min assist       General bed mobility comments: Min assist to elevate trunk to EOB, increased time to perform  Transfers Overall transfer level: Needs assistance Equipment used: Rolling walker (2 wheeled) Transfers: Sit to/from Stand Sit to Stand: Min assist         General transfer comment: Min assist for power up to standing increased effort to perform from bed and from toilet, Vcs for hand placement  Ambulation/Gait Ambulation/Gait assistance: Min assist Gait Distance (Feet): 70 Feet Assistive device: Rolling walker (2 wheeled) Gait Pattern/deviations: Shuffle;Staggering right;Drifts right/left;Narrow  base of support Gait velocity: decreased Gait velocity interpretation: <1.31 ft/sec, indicative of household ambulator General Gait Details: Patient with some improvements in overall stability and tolerated increased ambulation distance today with rest break. Continues to required hands on assist for balance and multi modal cues for positioning with RW   Stairs             Wheelchair Mobility    Modified Rankin (Stroke Patients Only) Modified Rankin (Stroke Patients Only) Pre-Morbid Rankin Score: No symptoms Modified Rankin: Moderately severe disability     Balance Overall balance assessment: History of Falls;Needs assistance Sitting-balance support: Feet supported Sitting balance-Leahy Scale: Fair     Standing balance support: During functional activity Standing balance-Leahy Scale: Poor                              Cognition Arousal/Alertness: Awake/alert Behavior During Therapy: Flat affect Overall Cognitive Status: No family/caregiver present to determine baseline cognitive functioning                                 General Comments: some dlow processing throughout session, per SLP cognitive concerns at baseline      Exercises      General Comments        Pertinent Vitals/Pain Pain Assessment: No/denies pain Pain Descriptors / Indicators: Moaning Pain Intervention(s): Monitored during session    Home Living                      Prior Function            PT Goals (current goals can  now be found in the care plan section) Acute Rehab PT Goals Patient Stated Goal: to not fall so much PT Goal Formulation: With patient Time For Goal Achievement: 10/22/18 Potential to Achieve Goals: Good Progress towards PT goals: Progressing toward goals    Frequency    Min 4X/week      PT Plan Current plan remains appropriate    Co-evaluation              AM-PAC PT "6 Clicks" Mobility   Outcome Measure  Help  needed turning from your back to your side while in a flat bed without using bedrails?: A Little Help needed moving from lying on your back to sitting on the side of a flat bed without using bedrails?: A Lot Help needed moving to and from a bed to a chair (including a wheelchair)?: A Lot Help needed standing up from a chair using your arms (e.g., wheelchair or bedside chair)?: A Lot Help needed to walk in hospital room?: A Lot Help needed climbing 3-5 steps with a railing? : A Lot 6 Click Score: 13    End of Session Equipment Utilized During Treatment: Gait belt Activity Tolerance: Patient tolerated treatment well Patient left: in chair;with call bell/phone within reach;with chair alarm set Nurse Communication: Mobility status PT Visit Diagnosis: Unsteadiness on feet (R26.81);Difficulty in walking, not elsewhere classified (R26.2);History of falling (Z91.81);Repeated falls (R29.6)     Time: 4315-4008 PT Time Calculation (min) (ACUTE ONLY): 21 min  Charges:  $Gait Training: 8-22 mins                     Alben Deeds, PT DPT  Board Certified Neurologic Specialist [place] Pager [phone] Office [phone]    Duncan Dull 10/11/2018, 10:43 AM

## 2018-10-11 NOTE — PMR Pre-admission (Signed)
PMR Admission Coordinator Pre-Admission Assessment  Patient: Margaret [name] is an 78 y.o., female MRN: [id number] DOB: 13-Apr-1941 Height: 5' 8"  (172.7 cm) Weight: 67.7 kg  Insurance Information HMO:     PPO: yes     PCP:      IPA:      80/20:      OTHER: medicare advantage plan PRIMARY: [employer]      Policy#: [id number]      Subscriber: pt CM Name: Marlowe Kays      Phone#: [phone]     Fax#: [phone] Pre-Cert#: [id number] approved for 7 days      Employer:  Benefits:  Phone #: [phone]     Name: 4/22 Eff. Date: 06/22/2018     Deduct: none      Out of Pocket Max: $3400  ( met [phone])    Life Max: none CIR: $295 co pay per day days 1 until 6     SNF: $20 co pay per day days 1 until 20; $160 co pay per day days 21 unit; 100 Outpatient: $15 co pay per visit     Co-Pay: visits per medical neccesity Home Health: 100%      Co-Pay: visits per medical neccesity DME: 80%     Co-Pay: 20% Providers: in network  SECONDARY: none       Medicaid Application Date:       Case Manager:  Disability Application Date:       Case Worker:   The "Data Collection Information Summary" for patients in Inpatient Rehabilitation Facilities with attached "Privacy Act [place] Records" was provided and verbally reviewed with: Patient and Family  Emergency Contact Information Contact Information    Name Relation Home Work Mobile   Finnie,William Spouse [phone]  [phone]   Barrett Henle Daughter [phone]  [phone]      Current Medical History  Patient Admitting Diagnosis: SAH  History of Present Illness: Margaret [name] is a 78 year old right-handed female with history of CAD with stenting maintained on Plavix, diastolic congestive heart failure, diet-controlled diabetes mellitus, PAF maintained on Eliquis followed by Dr. Martinique as well as ovarian cancer maintained on chemotherapy.  Presented 10/08/2018 after a fall a couple of days ago noted increased lethargy/fatigue.  No  nausea vomiting.  Cranial CT scan showed small volume bilateral subarachnoid hemorrhage.  Chronic ischemic changes including a large right PCA infarction.  Patient's Eliquis and Plavix were held due to subarachnoid hemorrhage.  Neurosurgery follow-up Dr. Vertell Limber advise conservative care with follow-up cranial CT scan stable.  Anticoagulation currently remains on hold until further notice by cardiology services.  Tolerating a regular diet.    Complete NIHSS TOTAL: 0  Patient's medical record from [facility]  has been reviewed by the rehabilitation admission coordinator and physician.  Past Medical History  Past Medical History:  Diagnosis Date  . Acetabular fracture ([place])   . Anxiety and depression   . Bartholin cyst   . CAD (coronary artery disease)   . Cancer ([place])    mouth cyst was cancer many years ago   . Cellulitis of right leg 2005  . CHF (congestive heart failure) ([place])   . Colon polyp   . Complication of anesthesia   . Diabetes mellitus without complication ([place])   . DJD (degenerative joint disease)   . Dyspnea on exertion   . Dysrhythmia    PAF  . GERD (gastroesophageal reflux disease)   . Hyperlipidemia   . Hypertension   . Morbid obesity ([place])   .  Myocardial infarction ([place]) 06/14/2017  . Ovarian cancer ([place])   . PAF (paroxysmal atrial fibrillation) ([place])    a. diagnosed 7/19; b. CHADS2VASc 8; c. Eliquis  . PONV (postoperative nausea and vomiting)   . Presence of stent in right coronary artery 02/08/12   2.5x18 Xience distal RCA  . Right shoulder pain   . Stroke ([place]) 2017  . Vaginal prolapse     Family History   family history includes Cancer in her maternal grandfather; Colon cancer (age of onset: 61) in her mother; Diabetes in her brother, maternal grandmother, and sister; Heart attack in her father; Heart attack (age of onset: 9) in her brother and sister; Hypertension in her daughter and sister; Thyroid disease in her daughter.  Prior  Rehab/Hospitalizations Has the patient had prior rehab or hospitalizations prior to admission? Yes  Has the patient had major surgery during 100 days prior to admission? No   Current Medications  Current Facility-Administered Medications:  .  0.9 % NaCl with KCl 20 mEq/ L  infusion, , Intravenous, Continuous, Erline Levine, MD, Last Rate: 75 mL/hr at 10/11/18 1658 .  acetaminophen (TYLENOL) tablet 650 mg, 650 mg, Oral, Q4H PRN, Erline Levine, MD .  amLODipine [facility]) tablet 2.5 mg, 2.5 mg, Oral, Daily, Erline Levine, MD, 2.5 mg at 10/12/18 0907 .  atorvastatin (LIPITOR) tablet 80 mg, 80 mg, Oral, QHS, Erline Levine, MD, 80 mg at 10/11/18 2252 .  cyclobenzaprine (FLEXERIL) tablet 10 mg, 10 mg, Oral, TID PRN, Erline Levine, MD .  ezetimibe (ZETIA) tablet 10 mg, 10 mg, Oral, Daily, Erline Levine, MD, 10 mg at 10/12/18 8676 .  ferrous sulfate tablet 325 mg, 325 mg, Oral, Q breakfast, Erline Levine, MD, 325 mg at 10/12/18 0909 .  hydrALAZINE (APRESOLINE) injection 5-20 mg, 5-20 mg, Intravenous, Q4H PRN, Costella, Vincent J, PA-C, 20 mg at 10/11/18 2011 .  HYDROcodone-acetaminophen (NORCO/VICODIN) 5-325 MG per tablet 1 tablet, 1 tablet, Oral, QHS PRN, Erline Levine, MD .  HYDROcodone-acetaminophen (NORCO/VICODIN) 5-325 MG per tablet 1 tablet, 1 tablet, Oral, Q4H PRN, Erline Levine, MD .  HYDROcodone-acetaminophen (NORCO/VICODIN) 5-325 MG per tablet 2 tablet, 2 tablet, Oral, Q4H PRN, Erline Levine, MD .  labetalol (NORMODYNE) injection 10-40 mg, 10-40 mg, Intravenous, Q10 min PRN, Costella, Vista Mink, PA-C .  lidocaine-prilocaine (EMLA) cream, , Topical, Once, Erline Levine, MD .  LORazepam (ATIVAN) tablet 0.5 mg, 0.5 mg, Oral, Q6H PRN, Erline Levine, MD .  losartan (COZAAR) tablet 100 mg, 100 mg, Oral, Daily, Erline Levine, MD, 100 mg at 10/12/18 0906 .  metoprolol tartrate (LOPRESSOR) tablet 25 mg, 25 mg, Oral, BID, Erline Levine, MD, 25 mg at 10/12/18 0906 .  multivitamin with minerals tablet 1  tablet, 1 tablet, Oral, Daily, Erline Levine, MD, 1 tablet at 10/12/18 [phone] .  nitroGLYCERIN (NITROSTAT) SL tablet 0.4 mg, 0.4 mg, Sublingual, Q5 min PRN, Erline Levine, MD .  ondansetron [facility]) tablet 4 mg, 4 mg, Oral, Q6H PRN **OR** ondansetron (ZOFRAN) injection 4 mg, 4 mg, Intravenous, Q6H PRN, Erline Levine, MD .  pantoprazole (PROTONIX) EC tablet 40 mg, 40 mg, Oral, Daily, Erline Levine, MD, 40 mg at 10/12/18 0907 .  polyvinyl alcohol (LIQUIFILM TEARS) 1.4 % ophthalmic solution 1 drop, 1 drop, Left Eye, QID PRN, Judith Part, MD .  potassium chloride SA (K-DUR) CR tablet 20 mEq, 20 mEq, Oral, Daily, Erline Levine, MD, 20 mEq at 10/12/18 0906 .  prochlorperazine (COMPAZINE) tablet 10 mg, 10 mg, Oral, Q6H PRN, Erline Levine, MD .  protein supplement (ENSURE MAX) liquid, 11 oz, Oral, BID BM, Erline Levine, MD, 11 oz at 10/12/18 0906 .  senna-docusate (Senokot-S) tablet 1 tablet, 1 tablet, Oral, Daily, Erline Levine, MD, 1 tablet at 10/12/18 [phone] .  sertraline (ZOLOFT) tablet 100 mg, 100 mg, Oral, Daily, Erline Levine, MD, 100 mg at 10/12/18 0907 .  Vitamin D (Ergocalciferol) (DRISDOL) capsule 50,000 Units, 50,000 Units, Oral, Crista Curb, MD, 50,000 Units at 10/10/18 9373  Patients Current Diet:  Diet Order            Diet regular Room service appropriate? Yes with Assist; Fluid consistency: Thin  Diet effective now              Precautions / Restrictions Precautions Precautions: Fall Restrictions Weight Bearing Restrictions: No   Has the patient had 2 or more falls or a fall with injury in the past year? Yes  Prior Activity Level Limited Community (1-2x/wk): decline in function since begining chemo 3 months ago  Prior Functional Level Self Care: Did the patient need help bathing, dressing, using the toilet or eating? Needed some help  Indoor Mobility: Did the patient need assistance with walking from room to room (with or without device)?  Independent  Stairs: Did the patient need assistance with internal or external stairs (with or without device)? Needed some help  Functional Cognition: Did the patient need help planning regular tasks such as shopping or remembering to take medications? Needed some help  Home Assistive Devices / [place] Devices/Equipment: Cane (specify quad or straight), Walker (specify type) Home Equipment: None  Prior Device Use: Indicate devices/aids used by the patient prior to current illness, exacerbation or injury? Walker  Current Functional Level Cognition  Arousal/Alertness: Awake/alert Overall Cognitive Status: Impaired/Different from baseline Current Attention Level: Selective Orientation Level: Oriented X4 General Comments: Pt is slow to process info requires cueing to recall info accurately  Attention: Focused, Sustained Focused Attention: Appears intact Sustained Attention: Impaired Sustained Attention Impairment: Verbal complex Memory: Impaired Memory Impairment: Retrieval deficit, Decreased recall of new information(Immediate: 3/3; Delayed: 1/3; 0/2 with cues) Awareness: Impaired Awareness Impairment: Intellectual impairment Problem Solving: Impaired Problem Solving Impairment: Verbal complex(2/4)    Extremity Assessment (includes Sensation/Coordination)  Upper Extremity Assessment: Generalized weakness  Lower Extremity Assessment: Generalized weakness    ADLs  Overall ADL's : Needs assistance/impaired Eating/Feeding: Modified independent, Sitting Grooming: Wash/dry hands, Wash/dry face, Oral care, Set up, Sitting Upper Body Bathing: Minimal assistance, Sitting Lower Body Bathing: Moderate assistance, Sit to/from stand Upper Body Dressing : Set up, Sitting Lower Body Dressing: Moderate assistance, Sit to/from stand Lower Body Dressing Details (indicate cue type and reason): able to access feet, but requires assist to fully complete task  Toilet Transfer:  Minimal assistance, Stand-pivot, BSC Toileting- Clothing Manipulation and Hygiene: Minimal assistance, Sit to/from stand Functional mobility during ADLs: Minimal assistance    Mobility  Overal bed mobility: Needs Assistance Bed Mobility: Sit to Supine Rolling: Min guard Sidelying to sit: Min assist Sit to supine: Min guard General bed mobility comments: increased time     Transfers  Overall transfer level: Needs assistance Equipment used: Rolling walker (2 wheeled) Transfers: Sit to/from Stand, [employer] Transfers Sit to Stand: Min assist Stand pivot transfers: Min assist General transfer comment: assist to steady     Ambulation / Gait / Stairs / Emergency planning/management officer  Ambulation/Gait Ambulation/Gait assistance: Herbalist (Feet): 70 Feet Assistive device: Rolling walker (2 wheeled) Gait Pattern/deviations: Shuffle, Staggering right, Drifts right/left,  Narrow base of support General Gait Details: Patient with some improvements in overall stability and tolerated increased ambulation distance today with rest break. Continues to required hands on assist for balance and multi modal cues for positioning with RW Gait velocity: decreased Gait velocity interpretation: <1.31 ft/sec, indicative of household ambulator    Posture / Balance Balance Overall balance assessment: History of Falls, Needs assistance Sitting-balance support: Feet supported Sitting balance-Leahy Scale: Fair Standing balance support: During functional activity Standing balance-Leahy Scale: Poor    Special needs/care consideration BiPAP/CPAP  N/a CPM  N/a Continuous Drip IV  IVF at 75 cc/hr via porto cath Dialysis n/a Life Vest  N/a Oxygen  N/a Special Bed  N/a Trach Size  N/a Wound Vac n/a Skin intact                            Bowel mgmt: 4/20; continent Bladder mgmt: continent Diabetic mgmt:  N/a Behavioral consideration  Daughter feels pt is depressed Chemo/radiation  Yes active  chemotherapy pta for ovarian cancer   Previous Home Environment  Living Arrangements: Spouse/significant other  Lives With: Spouse Available Help at Discharge: Family, Available 24 hours/day Type of Home: House Home Layout: One level Home Access: Stairs to enter Entrance Stairs-Rails: Can reach both Entrance Stairs-Number of Steps: 4 Bathroom Shower/Tub: Multimedia programmer: Programmer, systems: Yes Home Care Services: No  Discharge Living Setting Plans for Discharge Living Setting: Patient's home, Lives with (comment)(spouse) Type of Home at Discharge: House Discharge Home Layout: One level Discharge Home Access: Stairs to enter Entrance Stairs-Rails: Can reach both Entrance Stairs-Number of Steps: 4 Discharge Bathroom Shower/Tub: Walk-in shower Discharge Bathroom Toilet: Standard Does the patient have any problems obtaining your medications?: No  Social/Family/Support Systems Patient Roles: Spouse, Parent Contact Information: spouse, Gwyndolyn Saxon and daughter, [name] Anticipated Caregiver: spouse and daughter Anticipated Caregiver's Contact Information: see above Ability/Limitations of Caregiver: daughter Therapist, sports at [employer], spouse availalble 24/7 Caregiver Availability: 24/7 Discharge Plan Discussed with Primary Caregiver: Yes Is Caregiver In Agreement with Plan?: Yes Does Caregiver/Family have Issues with Lodging/Transportation while Pt is in Rehab?: No  Goals/Additional Needs Patient/Family Goal for Rehab: supervision to min PT and OT, supervision SLP Expected length of stay: ELOS 10 to 12 days Special Service Needs: Patietn was receivieng chemotherapy for ovarian cancer; now on hold Pt/Family Agrees to Admission and willing to participate: Yes Program Orientation Provided & Reviewed with Pt/Caregiver Including Roles  & Responsibilities: Yes  Decrease burden of Care through IP rehab admission: n/a  Possible need for SNF placement upon discharge: not  anticipated  Patient Condition: I have reviewed medical records from [facility] , spoken with CM, and patient, spouse and daughter. I met with patient at the bedside for inpatient rehabilitation assessment.  Patient will benefit from ongoing PT and OT, can actively participate in 3 hours of therapy a day 5 days of the week, and can make measurable gains during the admission.  Patient will also benefit from the coordinated team approach during an Inpatient Acute Rehabilitation admission.  The patient will receive intensive therapy as well as Rehabilitation physician, nursing, social worker, and care management interventions.  Due to safety, skin/wound care, disease management, medication administration and patient education the patient requires 24 hour a day rehabilitation nursing.  The patient is currently mod assist to min with mobility and basic ADLs.  Discharge setting and therapy post discharge at home with home health is anticipated.  Patient has  agreed to participate in the Acute Inpatient Rehabilitation Program and will admit today.  Preadmission Screen Completed By:  Annamary Rummage MSN 10/12/2018 12:21 PM ______________________________________________________________________   Discussed status with Dr. Posey Pronto on  10/12/2018 at  1220 and received approval for admission today.  Admission Coordinator:  Caraline, Deutschman, RN MSN, time  3552 Date  10/12/2018   Assessment/Plan: Diagnosis: SAH  1. Does the need for close, 24 hr/day Medical supervision in concert with the patient's rehab needs make it unreasonable for this patient to be served in a less intensive setting? Yes  2. Co-Morbidities requiring supervision/potential complications:  CAD with stenting maintained on Plavix, diastolic congestive heart failure, diet-controlled diabetes mellitus, PAF maintained on Eliquis followed by Dr. Martinique as well as ovarian cancer maintained on chemotherapy 3. Due to safety, disease  management and patient education, does the patient require 24 hr/day rehab nursing? Yes 4. Does the patient require coordinated care of a physician, rehab nurse, PT (1-2 hrs/day, 5 days/week) and OT (1-2 hrs/day, 5 days/week) to address physical and functional deficits in the context of the above medical diagnosis(es)? Yes Addressing deficits in the following areas: balance, endurance, locomotion, strength, transferring, bathing, dressing, toileting and psychosocial support 5. Can the patient actively participate in an intensive therapy program of at least 3 hrs of therapy 5 days a week? Yes 6. The potential for patient to make measurable gains while on inpatient rehab is excellent 7. Anticipated functional outcomes upon discharge from inpatients are: modified independent and supervision PT, modified independent and supervision OT, n/a SLP 8. Estimated rehab length of stay to reach the above functional goals is: 11-13 days. 9. Anticipated D/C setting: Home 10. Anticipated post D/C treatments: HH therapy and Home excercise program 11. Overall Rehab/Functional Prognosis: good  MD Signature: Delice Lesch, MD, ABPMR

## 2018-10-11 NOTE — TOC Initial Note (Signed)
Transition of Care [facility]) - Initial/Assessment Note    Patient Details  Name: Margaret [name] MRN: [id number] Date of Birth: 04-Jun-1941  [facility] CM/SW Contact:    Alexander Mt, [place] Phone Number: 10/11/2018, 11:49 AM  Clinical Narrative:                 At request of SLP, CSW spoke with pt at bedside. Pt oriented and pleasant, CSW introduced self, role, reason for visit. Pt from home with her spouse and adult daughter who is an [employer] with [employer]. Pt is originally from [place] and has been married to her husband for 40 years. Pt has been feeling exhausted most of the time nowadays from treatment and is eager to get stronger at [facility] and return home. CSW asked pt about her relationship with her husband and that there had been concerns over comments made yesterday. Pt agrees that her husband "is not the smoothest talker" and "he could be nicer." However she states that she does not feel unsafe or any negative feelings toward returning home.  We discussed that negative or aggressive comments are abusive and are not okay- should pt be interested in any resources she is aware CSW is here for her support. Will continue to follow should CIR not be approved.   Expected Discharge Plan: IP Rehab Facility Barriers to Discharge: Ship broker, Continued Medical Work up   Patient Goals and CMS Choice Patient states their goals for this hospitalization and ongoing recovery are:: to get stronger      Expected Discharge Plan and Services Expected Discharge Plan: [place] In-house Referral: Clinical Social Work Discharge Planning Services: NA   Living arrangements for the past 2 months: [place]                          Prior Living Arrangements/Services Living arrangements for the past 2 months: Single Family Home Lives with:: Spouse Patient language and need for interpreter reviewed:: Yes Do you feel safe going back to the place where you  live?: Yes      Need for Family Participation in Patient Care: Yes (Comment)(decision making; support) Care giver support system in place?: Yes (comment)(spouse and daughter) Current home services: DME Criminal Activity/Legal Involvement Pertinent to Current Situation/Hospitalization: No - Comment as needed  Activities of Daily Living Home Assistive Devices/Equipment: Cane (specify quad or straight), Walker (specify type) ADL Screening (condition at time of admission) Patient's cognitive ability adequate to safely complete daily activities?: Yes Is the patient deaf or have difficulty hearing?: No Does the patient have difficulty seeing, even when wearing glasses/contacts?: Yes Does the patient have difficulty concentrating, remembering, or making decisions?: No Patient able to express need for assistance with ADLs?: Yes Does the patient have difficulty dressing or bathing?: No Independently performs ADLs?: Yes (appropriate for developmental age) Does the patient have difficulty walking or climbing stairs?: Yes Weakness of Legs: Both Weakness of Arms/Hands: Both  Permission Sought/Granted Permission sought to share information with : Family Supports Permission granted to share information with : Yes, Verbal Permission Granted  Share Information with NAME: Barrett Henle  Permission granted to share info w AGENCY: CIR  Permission granted to share info w Relationship: daughter  Permission granted to share info w Contact Information: [phone]  Emotional Assessment Appearance:: Appears stated age Attitude/Demeanor/Rapport: Engaged, Gracious Affect (typically observed): Accepting, Adaptable, Appropriate, Quiet Orientation: : Oriented to Self, Oriented to Place, Oriented to Situation  Alcohol / Substance Use: Not Applicable Psych Involvement: No (comment)  Admission diagnosis:  Fall [W19.XXXA] Weakness [R53.1] Patient Active Problem List   Diagnosis Date Noted  . Subarachnoid  hemorrhage ([place]) 10/08/2018  . Right ovarian cyst 08/12/2018  . Accelerated hypertension 07/31/2018  . Elevated lactic acid level 07/31/2018  . Hypertensive urgency 07/31/2018  . Goals of care, counseling/discussion 07/22/2018  . Primary high grade serous adenocarcinoma of ovary ([place]) 07/13/2018  . Controlled type 2 diabetes mellitus without complication, without long-term current use of insulin ([place]) 02/02/2018  . Bradycardia 01/13/2018  . Essential hypertension 11/04/2017  . Other hyperlipidemia 11/04/2017  . Hx of gastric bypass 11/04/2017  . NSTEMI (non-ST elevated myocardial infarction) ([place]) 06/14/2017  . History of ischemic right PCA stroke 03/27/2016  . Hemianopia, homonymous, left 01/31/2016  . Stroke ([place]) 11/06/2015  . Arthritis of knee, degenerative 05/04/2013  . Diastolic dysfunction 93/73/4287  . CAD S/P percutaneous coronary angioplasty   . Morbid obesity ([place])   . Right shoulder pain   . Anxiety and depression   . Dyspnea on exertion   . Cellulitis of right leg    PCP:  Baxter Hire, MD Pharmacy:   [facility], [place] [address] Phone: [phone] Fax: [phone]     Social Determinants of Health (SDOH) Interventions    Readmission Risk Interventions No flowsheet data found.

## 2018-10-11 NOTE — Evaluation (Signed)
Occupational Therapy Evaluation Patient Details Name: Margaret [name] MRN: [id number] DOB: [birth year] Today's Date: 10/11/2018    History of Present Illness 78 y.o. female here for evaluation for fatigue and weakness with fall.  2 fall this week and neighbor that found pt reported that pt had garbled speech and was dragging a leg. Patient has small areas of subarachnoid hemorrhage.   Clinical Impression   Pt admitted with above. She demonstrates the below listed deficits and will benefit from continued OT to maximize safety and independence with BADLs.  Pt presents to OT with generalized weakness, impaired balance, impaired cognition, and generalized pain.   She requires min - mod A for ADLs and functional transfers.   She lives with spouse, and was mod I with ADLs, but required assist with IADLs.  She has h/o frequent falls.   Feel she would benefit from CIR to increase her independence and safety with ADLs to reduce risk of falls, and reduce risk of readmission.       Follow Up Recommendations  CIR    Equipment Recommendations  3 in 1 bedside commode    Recommendations for Other Services Rehab consult     Precautions / Restrictions Precautions Precautions: Fall Restrictions Weight Bearing Restrictions: No      Mobility Bed Mobility Overal bed mobility: Needs Assistance Bed Mobility: Sit to Supine Rolling: Min guard Sidelying to sit: Min assist   Sit to supine: Min guard   General bed mobility comments: increased time   Transfers Overall transfer level: Needs assistance Equipment used: Rolling walker (2 wheeled) Transfers: Sit to/from [facility] Sit to Stand: Min assist Stand pivot transfers: Min assist       General transfer comment: assist to steady     Balance Overall balance assessment: History of Falls;Needs assistance Sitting-balance support: Feet supported Sitting balance-Leahy Scale: Fair     Standing balance support: During functional  activity Standing balance-Leahy Scale: Poor                             ADL either performed or assessed with clinical judgement   ADL Overall ADL's : Needs assistance/impaired Eating/Feeding: Modified independent;Sitting   Grooming: Wash/dry hands;Wash/dry face;Oral care;Set up;Sitting   Upper Body Bathing: Minimal assistance;Sitting   Lower Body Bathing: Moderate assistance;Sit to/from stand   Upper Body Dressing : Set up;Sitting   Lower Body Dressing: Moderate assistance;Sit to/from stand Lower Body Dressing Details (indicate cue type and reason): able to access feet, but requires assist to fully complete task  Toilet Transfer: Minimal assistance;Stand-pivot;BSC   Toileting- Clothing Manipulation and Hygiene: Minimal assistance;Sit to/from stand       Functional mobility during ADLs: Minimal assistance       Vision   Additional Comments: To be further assessed      Perception Perception Perception Tested?: Yes   Praxis Praxis Praxis tested?: Within functional limits    Pertinent Vitals/Pain Pain Assessment: No/denies pain Faces Pain Scale: Hurts little more Pain Location: unknown Pain Descriptors / Indicators: Moaning Pain Intervention(s): Monitored during session     Hand Dominance Right   Extremity/Trunk Assessment Upper Extremity Assessment Upper Extremity Assessment: Generalized weakness   Lower Extremity Assessment Lower Extremity Assessment: Generalized weakness   Cervical / Trunk Assessment Cervical / Trunk Assessment: Normal   Communication Communication Communication: No difficulties   Cognition Arousal/Alertness: Awake/alert Behavior During Therapy: Flat affect Overall Cognitive Status: Impaired/Different from baseline Area of Impairment: Attention;Problem solving;Memory  Current Attention Level: Selective Memory: Decreased short-term memory       Problem Solving: Requires verbal cues General  Comments: Pt is slow to process info requires cueing to recall info accurately    General Comments  VSS     Exercises     Shoulder Instructions      Home Living Family/patient expects to be discharged to:: Private residence Living Arrangements: Spouse/significant other Available Help at Discharge: Family;Available 24 hours/day Type of Home: House Home Access: Stairs to enter [employer] of Steps: 4 Entrance Stairs-Rails: Can reach both Home Layout: One level     Bathroom Shower/Tub: Occupational psychologist: Standard Bathroom Accessibility: Yes   Home Equipment: None      Lives With: Spouse    Prior Functioning/Environment Level of Independence: Needs assistance  Gait / Transfers Assistance Needed: ambulates with a rollator walkr per patient ADL's / Homemaking Assistance Needed: Pt reports she is able to perform ADLs mod I, but spouse has been having to assist with IADLs             OT Problem List: Decreased strength;Decreased activity tolerance;Impaired vision/perception;Impaired balance (sitting and/or standing);Decreased cognition;Decreased safety awareness;Decreased knowledge of use of DME or AE;Pain      OT Treatment/Interventions:      OT Goals(Current goals can be found in the care plan section) Acute Rehab OT Goals Patient Stated Goal: to have more energy  OT Goal Formulation: With patient Time For Goal Achievement: 10/25/18 Potential to Achieve Goals: Good  OT Frequency: Min 2X/week   Barriers to D/C:            Co-evaluation              AM-PAC OT "6 Clicks" Daily Activity     Outcome Measure Help from another person eating meals?: None Help from another person taking care of personal grooming?: A Little Help from another person toileting, which includes using toliet, bedpan, or urinal?: A Little Help from another person bathing (including washing, rinsing, drying)?: A Lot Help from another person to put on and taking  off regular upper body clothing?: A Little Help from another person to put on and taking off regular lower body clothing?: A Lot 6 Click Score: 17   End of Session Nurse Communication: Mobility status  Activity Tolerance: Patient tolerated treatment well Patient left: in bed;with call bell/phone within reach;with bed alarm set  OT Visit Diagnosis: Unsteadiness on feet (R26.81);Repeated falls (R29.6)                Time: 8638-1771 OT Time Calculation (min): 25 min Charges:  OT General Charges $OT Visit: 1 Visit OT Evaluation $OT Eval Moderate Complexity: 1 Mod OT Treatments $Self Care/Home Management : 8-22 mins  Lucille Passy, OTR/L Acute Rehabilitation Services Pager [phone] Office [phone]   Lucille Passy M 10/11/2018, 11:46 AM

## 2018-10-12 ENCOUNTER — Encounter (HOSPITAL_COMMUNITY): Payer: Self-pay | Admitting: *Deleted

## 2018-10-12 ENCOUNTER — Telehealth: Payer: Self-pay | Admitting: *Deleted

## 2018-10-12 ENCOUNTER — Inpatient Hospital Stay (HOSPITAL_COMMUNITY)
Admission: RE | Admit: 2018-10-12 | Discharge: 2018-10-25 | DRG: 945 | Disposition: A | Discharge status: Home Health | Payer: PPO | Source: Intra-hospital | Attending: Physical Medicine & Rehabilitation | Admitting: Physical Medicine & Rehabilitation

## 2018-10-12 ENCOUNTER — Other Ambulatory Visit: Payer: Self-pay

## 2018-10-12 ENCOUNTER — Other Ambulatory Visit: Payer: Self-pay | Admitting: *Deleted

## 2018-10-12 DIAGNOSIS — Z823 Family history of stroke: Secondary | ICD-10-CM

## 2018-10-12 DIAGNOSIS — E876 Hypokalemia: Secondary | ICD-10-CM | POA: Diagnosis not present

## 2018-10-12 DIAGNOSIS — I10 Essential (primary) hypertension: Secondary | ICD-10-CM | POA: Diagnosis not present

## 2018-10-12 DIAGNOSIS — Z955 Presence of coronary angioplasty implant and graft: Secondary | ICD-10-CM

## 2018-10-12 DIAGNOSIS — Z833 Family history of diabetes mellitus: Secondary | ICD-10-CM

## 2018-10-12 DIAGNOSIS — E119 Type 2 diabetes mellitus without complications: Secondary | ICD-10-CM | POA: Diagnosis present

## 2018-10-12 DIAGNOSIS — I110 Hypertensive heart disease with heart failure: Secondary | ICD-10-CM | POA: Diagnosis not present

## 2018-10-12 DIAGNOSIS — Z8543 Personal history of malignant neoplasm of ovary: Secondary | ICD-10-CM

## 2018-10-12 DIAGNOSIS — Z7902 Long term (current) use of antithrombotics/antiplatelets: Secondary | ICD-10-CM | POA: Diagnosis not present

## 2018-10-12 DIAGNOSIS — Z79899 Other long term (current) drug therapy: Secondary | ICD-10-CM | POA: Diagnosis not present

## 2018-10-12 DIAGNOSIS — I480 Paroxysmal atrial fibrillation: Secondary | ICD-10-CM

## 2018-10-12 DIAGNOSIS — E1151 Type 2 diabetes mellitus with diabetic peripheral angiopathy without gangrene: Secondary | ICD-10-CM | POA: Diagnosis not present

## 2018-10-12 DIAGNOSIS — W1830XD Fall on same level, unspecified, subsequent encounter: Secondary | ICD-10-CM

## 2018-10-12 DIAGNOSIS — S065X9A Traumatic subdural hemorrhage with loss of consciousness of unspecified duration, initial encounter: Secondary | ICD-10-CM | POA: Diagnosis not present

## 2018-10-12 DIAGNOSIS — I609 Nontraumatic subarachnoid hemorrhage, unspecified: Secondary | ICD-10-CM

## 2018-10-12 DIAGNOSIS — I5041 Acute combined systolic (congestive) and diastolic (congestive) heart failure: Secondary | ICD-10-CM

## 2018-10-12 DIAGNOSIS — I252 Old myocardial infarction: Secondary | ICD-10-CM | POA: Diagnosis not present

## 2018-10-12 DIAGNOSIS — I5032 Chronic diastolic (congestive) heart failure: Secondary | ICD-10-CM | POA: Diagnosis not present

## 2018-10-12 DIAGNOSIS — S066X9D Traumatic subarachnoid hemorrhage with loss of consciousness of unspecified duration, subsequent encounter: Principal | ICD-10-CM

## 2018-10-12 DIAGNOSIS — Z9221 Personal history of antineoplastic chemotherapy: Secondary | ICD-10-CM | POA: Diagnosis not present

## 2018-10-12 DIAGNOSIS — D508 Other iron deficiency anemias: Secondary | ICD-10-CM | POA: Diagnosis not present

## 2018-10-12 DIAGNOSIS — I2510 Atherosclerotic heart disease of native coronary artery without angina pectoris: Secondary | ICD-10-CM | POA: Diagnosis not present

## 2018-10-12 DIAGNOSIS — I952 Hypotension due to drugs: Secondary | ICD-10-CM | POA: Diagnosis not present

## 2018-10-12 DIAGNOSIS — I5042 Chronic combined systolic (congestive) and diastolic (congestive) heart failure: Secondary | ICD-10-CM | POA: Diagnosis not present

## 2018-10-12 DIAGNOSIS — D638 Anemia in other chronic diseases classified elsewhere: Secondary | ICD-10-CM | POA: Diagnosis not present

## 2018-10-12 DIAGNOSIS — Z8249 Family history of ischemic heart disease and other diseases of the circulatory system: Secondary | ICD-10-CM

## 2018-10-12 DIAGNOSIS — S065X0S Traumatic subdural hemorrhage without loss of consciousness, sequela: Secondary | ICD-10-CM | POA: Diagnosis not present

## 2018-10-12 DIAGNOSIS — Z90710 Acquired absence of both cervix and uterus: Secondary | ICD-10-CM

## 2018-10-12 DIAGNOSIS — S065X1S Traumatic subdural hemorrhage with loss of consciousness of 30 minutes or less, sequela: Secondary | ICD-10-CM | POA: Diagnosis not present

## 2018-10-12 DIAGNOSIS — D509 Iron deficiency anemia, unspecified: Secondary | ICD-10-CM | POA: Diagnosis not present

## 2018-10-12 DIAGNOSIS — I959 Hypotension, unspecified: Secondary | ICD-10-CM | POA: Diagnosis present

## 2018-10-12 DIAGNOSIS — E785 Hyperlipidemia, unspecified: Secondary | ICD-10-CM | POA: Diagnosis present

## 2018-10-12 DIAGNOSIS — S065X0D Traumatic subdural hemorrhage without loss of consciousness, subsequent encounter: Secondary | ICD-10-CM

## 2018-10-12 DIAGNOSIS — C569 Malignant neoplasm of unspecified ovary: Secondary | ICD-10-CM

## 2018-10-12 DIAGNOSIS — R269 Unspecified abnormalities of gait and mobility: Secondary | ICD-10-CM | POA: Diagnosis not present

## 2018-10-12 DIAGNOSIS — N390 Urinary tract infection, site not specified: Secondary | ICD-10-CM | POA: Diagnosis not present

## 2018-10-12 DIAGNOSIS — Z87891 Personal history of nicotine dependence: Secondary | ICD-10-CM | POA: Diagnosis not present

## 2018-10-12 DIAGNOSIS — Z8673 Personal history of transient ischemic attack (TIA), and cerebral infarction without residual deficits: Secondary | ICD-10-CM

## 2018-10-12 DIAGNOSIS — Z7901 Long term (current) use of anticoagulants: Secondary | ICD-10-CM | POA: Diagnosis not present

## 2018-10-12 DIAGNOSIS — B9620 Unspecified Escherichia coli [E. coli] as the cause of diseases classified elsewhere: Secondary | ICD-10-CM | POA: Diagnosis present

## 2018-10-12 DIAGNOSIS — I69398 Other sequelae of cerebral infarction: Secondary | ICD-10-CM | POA: Diagnosis not present

## 2018-10-12 DIAGNOSIS — S065XAA Traumatic subdural hemorrhage with loss of consciousness status unknown, initial encounter: Secondary | ICD-10-CM | POA: Diagnosis present

## 2018-10-12 DIAGNOSIS — D62 Acute posthemorrhagic anemia: Secondary | ICD-10-CM

## 2018-10-12 DIAGNOSIS — H53462 Homonymous bilateral field defects, left side: Secondary | ICD-10-CM | POA: Diagnosis present

## 2018-10-12 LAB — CULTURE, BLOOD (ROUTINE X 2)
Culture: NO GROWTH
Special Requests: ADEQUATE

## 2018-10-12 MED ORDER — POTASSIUM CHLORIDE CRYS ER 20 MEQ PO TBCR
20.0000 meq | EXTENDED_RELEASE_TABLET | Freq: Every day | ORAL | Status: DC
  Administered 2018-10-13 – 2018-10-25 (×13): 20 meq via ORAL
  Filled 2018-10-12 (×13): qty 1

## 2018-10-12 MED ORDER — METOPROLOL TARTRATE 25 MG PO TABS
25.0000 mg | ORAL_TABLET | Freq: Two times a day (BID) | ORAL | Status: DC
  Administered 2018-10-12 – 2018-10-25 (×26): 25 mg via ORAL
  Filled 2018-10-12 (×26): qty 1

## 2018-10-12 MED ORDER — ONDANSETRON HCL 4 MG/2ML IJ SOLN
4.0000 mg | Freq: Four times a day (QID) | INTRAMUSCULAR | Status: DC | PRN
  Administered 2018-10-13: 08:00:00 4 mg via INTRAVENOUS
  Filled 2018-10-12: qty 2

## 2018-10-12 MED ORDER — PANTOPRAZOLE SODIUM 40 MG PO TBEC
40.0000 mg | DELAYED_RELEASE_TABLET | Freq: Every day | ORAL | Status: DC
  Administered 2018-10-13 – 2018-10-25 (×13): 40 mg via ORAL
  Filled 2018-10-12 (×13): qty 1

## 2018-10-12 MED ORDER — ATORVASTATIN CALCIUM 80 MG PO TABS
80.0000 mg | ORAL_TABLET | Freq: Every day | ORAL | Status: DC
  Administered 2018-10-12 – 2018-10-24 (×13): 80 mg via ORAL
  Filled 2018-10-12 (×13): qty 1

## 2018-10-12 MED ORDER — POLYVINYL ALCOHOL 1.4 % OP SOLN
1.0000 [drp] | Freq: Four times a day (QID) | OPHTHALMIC | Status: DC | PRN
  Filled 2018-10-12: qty 15

## 2018-10-12 MED ORDER — CYCLOBENZAPRINE HCL 10 MG PO TABS: 10.0000 mg | ORAL_TABLET | Freq: Three times a day (TID) | ORAL | Status: DC | PRN

## 2018-10-12 MED ORDER — ADULT MULTIVITAMIN W/MINERALS CH
1.0000 | ORAL_TABLET | Freq: Every day | ORAL | Status: DC
  Administered 2018-10-13 – 2018-10-25 (×13): 1 via ORAL
  Filled 2018-10-12 (×13): qty 1

## 2018-10-12 MED ORDER — ENSURE MAX PROTEIN PO LIQD
11.0000 [oz_av] | Freq: Two times a day (BID) | ORAL | Status: DC
  Administered 2018-10-12 – 2018-10-19 (×13): 11 [oz_av] via ORAL
  Filled 2018-10-12 (×16): qty 330

## 2018-10-12 MED ORDER — HYDROCODONE-ACETAMINOPHEN 5-325 MG PO TABS
1.0000 | ORAL_TABLET | ORAL | Status: DC | PRN
  Administered 2018-10-14 – 2018-10-20 (×4): 1 via ORAL
  Filled 2018-10-12 (×4): qty 1

## 2018-10-12 MED ORDER — NITROGLYCERIN 0.4 MG SL SUBL: 0.4000 mg | SUBLINGUAL_TABLET | SUBLINGUAL | Status: DC | PRN

## 2018-10-12 MED ORDER — SORBITOL 70 % SOLN: 30.0000 mL | Freq: Every day | Status: DC | PRN

## 2018-10-12 MED ORDER — EZETIMIBE 10 MG PO TABS
10.0000 mg | ORAL_TABLET | Freq: Every day | ORAL | Status: DC
  Administered 2018-10-13 – 2018-10-25 (×13): 10 mg via ORAL
  Filled 2018-10-12 (×13): qty 1

## 2018-10-12 MED ORDER — FERROUS SULFATE 325 (65 FE) MG PO TABS
325.0000 mg | ORAL_TABLET | Freq: Every day | ORAL | Status: DC
  Administered 2018-10-13 – 2018-10-18 (×6): 325 mg via ORAL
  Filled 2018-10-12 (×6): qty 1

## 2018-10-12 MED ORDER — VITAMIN D (ERGOCALCIFEROL) 1.25 MG (50000 UNIT) PO CAPS
50000.0000 [IU] | ORAL_CAPSULE | ORAL | Status: DC
  Administered 2018-10-17 – 2018-10-24 (×2): 50000 [IU] via ORAL
  Filled 2018-10-12 (×2): qty 1

## 2018-10-12 MED ORDER — AMLODIPINE BESYLATE 2.5 MG PO TABS
2.5000 mg | ORAL_TABLET | Freq: Every day | ORAL | Status: DC
  Administered 2018-10-13 – 2018-10-15 (×3): 2.5 mg via ORAL
  Filled 2018-10-12 (×3): qty 1

## 2018-10-12 MED ORDER — SENNOSIDES-DOCUSATE SODIUM 8.6-50 MG PO TABS
1.0000 | ORAL_TABLET | Freq: Every day | ORAL | Status: DC
  Administered 2018-10-13 – 2018-10-25 (×12): 1 via ORAL
  Filled 2018-10-12 (×13): qty 1

## 2018-10-12 MED ORDER — LOSARTAN POTASSIUM 50 MG PO TABS
100.0000 mg | ORAL_TABLET | Freq: Every day | ORAL | Status: DC
  Administered 2018-10-13 – 2018-10-25 (×12): 100 mg via ORAL
  Filled 2018-10-12 (×11): qty 2

## 2018-10-12 MED ORDER — ACETAMINOPHEN 325 MG PO TABS
650.0000 mg | ORAL_TABLET | ORAL | Status: DC | PRN
  Administered 2018-10-16 – 2018-10-21 (×2): 650 mg via ORAL
  Filled 2018-10-12 (×2): qty 2

## 2018-10-12 MED ORDER — ONDANSETRON HCL 4 MG PO TABS
4.0000 mg | ORAL_TABLET | Freq: Four times a day (QID) | ORAL | Status: DC | PRN
  Administered 2018-10-24: 09:00:00 4 mg via ORAL
  Filled 2018-10-12 (×2): qty 1

## 2018-10-12 MED ORDER — SERTRALINE HCL 100 MG PO TABS
100.0000 mg | ORAL_TABLET | Freq: Every day | ORAL | Status: DC
  Administered 2018-10-13 – 2018-10-25 (×12): 100 mg via ORAL
  Filled 2018-10-12 (×19): qty 1

## 2018-10-12 NOTE — H&P (Signed)
Physical Medicine and Rehabilitation Admission H&P    Chief complaint: Headache  HPI: Margaret [name] is a 78 year old right-handed female with history of CAD with stenting maintained on Plavix, diastolic congestive heart failure, diet-controlled diabetes mellitus, PAF maintained on Eliquis followed by Dr. Martinique as well as ovarian cancer maintained on chemotherapy.  Patient lives with spouse.  One level home 4 steps to entry.  Ambulates with a rolling walker.  Needed assist with some household tasks from husband.  History taken from chart review and patient.  She presented on 10/08/2018 after a fall few days PTA with increased lethargy.  No nausea vomiting.  Cranial CT scan reviewed, showing small bilateral subarachnoid hemorrhages, right greater than left.  Chronic ischemic changes including a large right PCA infarction.  Eliquis and Plavix were held due to [facility].  Neurosurgery follow-up Dr. Vertell Limber recommended conservative care with follow-up head CT.  Follow-up head CT on 10/08/2018 reviewed, stable.  Recommendations per cardiology, to continue to hold anticoagulation.  Tolerating a regular diet.  Therapy evaluations completed with recommendations of physical medicine rehab consult.  Please see preadmission assessment from earlier today as well.  Review of Systems  Constitutional: Positive for malaise/fatigue. Negative for chills and fever.  HENT: Negative for hearing loss.   Eyes: Negative for blurred vision and double vision.  Respiratory: Negative for cough.        Shortness of breath with exertion  Cardiovascular: Negative for chest pain and palpitations.  Gastrointestinal: Negative for heartburn, nausea and vomiting.       GERD  Genitourinary: Negative for dysuria, flank pain and hematuria.  Musculoskeletal: Positive for falls.  Skin: Negative for rash.  Neurological: Positive for dizziness and weakness. Negative for speech change.  Psychiatric/Behavioral: Positive for depression. The  patient has insomnia.        Anxiety  All other systems reviewed and are negative.  Past Medical History:  Diagnosis Date  . Acetabular fracture ([place])   . Anxiety and depression   . Bartholin cyst   . CAD (coronary artery disease)   . 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 Cellulitis of right leg 2005  . CHF (congestive heart failure) ([place])   . Colon polyp   . Complication of anesthesia   . Diabetes mellitus without complication ([place])   . DJD (degenerative joint disease)   . Dyspnea on exertion   . Dysrhythmia    PAF  . GERD (gastroesophageal reflux disease)   . Hyperlipidemia   . Hypertension   . Morbid obesity ([place])   . Myocardial infarction ([place]) 06/14/2017  . Ovarian cancer ([place])   . PAF (paroxysmal atrial fibrillation) ([place])    a. diagnosed 7/19; b. CHADS2VASc 8; c. Eliquis  . PONV (postoperative nausea and vomiting)   . Presence of stent in right coronary artery 02/08/12   2.5x18 Xience distal RCA  . Right shoulder pain   . Stroke ([place]) 2017  . Vaginal prolapse    Past Surgical History:  Procedure Laterality Date  . ABDOMINAL HYSTERECTOMY     supracervical abdominal w/removal tubes &/or ovaries  . BREAST BIOPSY  1989  . CHOLECYSTECTOMY    . COLONOSCOPY    . COLONOSCOPY WITH PROPOFOL N/A 06/21/2018   Procedure: COLONOSCOPY WITH PROPOFOL;  Surgeon: Lollie Sails, MD;  Location: [facility] ENDOSCOPY;  Service: Endoscopy;  Laterality: N/A;  . CORONARY STENT INTERVENTION N/A 06/14/2017   Procedure: CORONARY STENT INTERVENTION;  Surgeon: Wellington Hampshire,  MD;  Location: [place] CV LAB;  Service: Cardiovascular;  Laterality: N/A;  . ESOPHAGOGASTRODUODENOSCOPY N/A 06/21/2018   Procedure: ESOPHAGOGASTRODUODENOSCOPY (EGD);  Surgeon: Lollie Sails, MD;  Location: [facility] ENDOSCOPY;  Service: Endoscopy;  Laterality: N/A;  . FACIAL COSMETIC SURGERY     face/neck lift  . FRACTURE SURGERY    . GASTRIC BYPASS  2008  . JOINT REPLACEMENT    . KNEE  SURGERY     left knee arthroscopic  . LAPAROSCOPIC BILATERAL SALPINGO OOPHERECTOMY Bilateral 06/24/2018   Procedure: LAPAROSCOPIC BILATERAL SALPINGO OOPHORECTOMY;  Surgeon: Schermerhorn, Gwen Her, MD;  Location: [facility] ORS;  Service: Gynecology;  Laterality: Bilateral;  . LEFT HEART CATH AND CORONARY ANGIOGRAPHY N/A 06/14/2017   Procedure: LEFT HEART CATH AND CORONARY ANGIOGRAPHY;  Surgeon: Wellington Hampshire, MD;  Location: [place] CV LAB;  Service: Cardiovascular;  Laterality: N/A;  . LEFT HEART CATH AND CORONARY ANGIOGRAPHY N/A 06/14/2017   Procedure: LEFT HEART CATH AND CORONARY ANGIOGRAPHY;  Surgeon: Wellington Hampshire, MD;  Location: [place] CV LAB;  Service: Cardiovascular;  Laterality: N/A;  . LYSIS OF ADHESION  06/24/2018   Procedure: LYSIS OF ADHESION;  Surgeon: Ouida Sills Gwen Her, MD;  Location: [facility] ORS;  Service: Gynecology;;  . Cris[place]   left hip fracture/repair and left arm fracture  . OTHER SURGICAL HISTORY  1986   hysterectomy  . PORTA CATH INSERTION N/A 07/26/2018   Procedure: PORTA CATH INSERTION;  Surgeon: Katha Cabal, MD;  Location: [place] CV LAB;  Service: Cardiovascular;  Laterality: N/A;  . right knee  late 90's   arthroscopic  . ROUX-EN-Y PROCEDURE    . SALIVARY GLAND SURGERY    . TOTAL HIP ARTHROPLASTY  2004   left   Family History  Problem Relation Age of Onset  . Heart attack Father        MI age 13 and 8  . Heart attack Sister 82       cabg; deceased 74  . Diabetes Sister   . Hypertension Sister   . Heart attack Brother 69       cabg; deceased 80s  . Diabetes Brother   . Colon cancer Mother 33       deceased 21  . Diabetes Maternal Grandmother   . Cancer Maternal Grandfather        unknown type; deceased  . Hypertension Daughter   . Thyroid disease Daughter   . Breast cancer Neg Hx    Social History:  reports that she quit smoking about 34 years ago. Her smoking use included cigarettes. She has a 52.00 pack-year smoking  history. She has never used smokeless tobacco. She reports current alcohol use. She reports that she does not use drugs. Allergies:  Allergies  Allergen Reactions  . Erythromycin Base Nausea And Vomiting  . Tape Itching, Rash and Other (See Comments)    Also "band-aids"   Medications Prior to Admission  Medication Sig Dispense Refill  . amLODipine (NORVASC) 2.5 MG tablet Take 1 tablet (2.5 mg total) by mouth daily. 90 tablet 3  . apixaban (ELIQUIS) 5 MG TABS tablet Take 1 tablet (5 mg total) by mouth 2 (two) times daily. 60 tablet 0  . atorvastatin (LIPITOR) 80 MG tablet Take 80 mg by mouth at bedtime.    . clopidogrel (PLAVIX) 75 MG tablet Take 1 tablet (75 mg total) by mouth daily. 30 tablet 0  . cyanocobalamin (,VITAMIN B-12,) 1000 MCG/ML injection Inject 1,000 mcg into the muscle every  30 (thirty) days.    . cyclobenzaprine (FLEXERIL) 10 MG tablet Take 10 mg by mouth 2 (two) times daily as needed for spasms or pain.    . ergocalciferol (VITAMIN D2) 1.25 MG (50000 UT) capsule Take 50,000 Units by mouth every Monday.    . ezetimibe (ZETIA) 10 MG tablet TAKE 1 TABLET(10 MG) BY MOUTH DAILY (Patient taking differently: Take 10 mg by mouth daily. ) 30 tablet 5  . HYDROcodone-acetaminophen (NORCO/VICODIN) 5-325 MG tablet Take 1 tablet by mouth at bedtime as needed for moderate pain. 30 tablet 0  . LORazepam (ATIVAN) 0.5 MG tablet Take 1 tablet (0.5 mg total) by mouth every 6 (six) hours as needed (Nausea or vomiting). 30 tablet 0  . losartan (COZAAR) 100 MG tablet Take 100 mg by mouth daily.    . metoprolol tartrate (LOPRESSOR) 25 MG tablet Take 25 mg by mouth 2 (two) times daily.    . Multiple Vitamin (MULTIVITAMIN WITH MINERALS) TABS tablet Take 1 tablet by mouth daily. 30 tablet 1  . Multiple Vitamins-Minerals (OCUVITE PO) Take 1 capsule by mouth daily.    . nitroGLYCERIN (NITROSTAT) 0.4 MG SL tablet Place 1 tablet (0.4 mg total) under the tongue every 5 (five) minutes as needed for chest  pain. 30 tablet 2  . omeprazole (PRILOSEC) 20 MG capsule Take 20 mg by mouth daily.    . ondansetron (ZOFRAN) 4 MG tablet Take 4 mg by mouth every 8 (eight) hours as needed for nausea or vomiting.    . potassium chloride (K-DUR) 10 MEQ tablet Take 10 mEq by mouth daily.    . prochlorperazine (COMPAZINE) 10 MG tablet Take 1 tablet (10 mg total) by mouth every 6 (six) hours as needed (Nausea or vomiting). 30 tablet 1  . sertraline (ZOLOFT) 100 MG tablet Take 100 mg by mouth daily.     [place] Kitchen dexamethasone (DECADRON) 4 MG tablet Take 2 tablets (8 mg total) by mouth daily. Start the day after chemotherapy for 2 days. 30 tablet 1  . ferrous sulfate 325 (65 FE) MG tablet Take 325 mg by mouth daily with breakfast.    . lidocaine-prilocaine (EMLA) cream Apply to affected area once 30 g 3  . potassium chloride SA (K-DUR,KLOR-CON) 20 MEQ tablet Take 1 tablet (20 mEq total) by mouth daily. (Patient not taking: Reported on 10/07/2018) 30 tablet 1  . protein supplement shake (PREMIER PROTEIN) LIQD Take 325 mLs (11 oz total) by mouth 2 (two) times daily between meals. (Patient not taking: Reported on 09/29/2018) 60 Can 0  . rosuvastatin (CRESTOR) 40 MG tablet Take 40 mg by mouth at bedtime.    . senna-docusate (SENNA S) 8.6-50 MG tablet Take 1 tablet by mouth daily.       Drug Regimen Review Drug regimen was reviewed and remains appropriate with no significant issues identified  Home: Home Living Family/patient expects to be discharged to:: Private residence Living Arrangements: Spouse/significant other Available Help at Discharge: Family, Available 24 hours/day Type of Home: House Home Access: Stairs to enter [employer] of Steps: 4 Entrance Stairs-Rails: Can reach both Home Layout: One level Bathroom Shower/Tub: Multimedia programmer: Programmer, systems: Yes Home Equipment: None  Lives With: Spouse   Functional History: Prior Function Level of Independence: Needs  assistance Gait / Transfers Assistance Needed: ambulates with a rollator walkr per patient ADL's / Homemaking Assistance Needed: Pt reports she is able to perform ADLs mod I, but spouse has been having to assist with IADLs  Comments:  patient reports she has a rollator walkers that she uses for mobility  Functional Status:  Mobility: Bed Mobility Overal bed mobility: Needs Assistance Bed Mobility: Sit to Supine Rolling: Min guard Sidelying to sit: Min assist Sit to supine: Min guard General bed mobility comments: increased time  Transfers Overall transfer level: Needs assistance Equipment used: Rolling walker (2 wheeled) Transfers: Sit to/from Stand, [employer] Transfers Sit to Stand: Min assist Stand pivot transfers: Min assist General transfer comment: assist to steady  Ambulation/Gait Ambulation/Gait assistance: Min assist Gait Distance (Feet): 70 Feet Assistive device: Rolling walker (2 wheeled) Gait Pattern/deviations: Shuffle, Staggering right, Drifts right/left, Narrow base of support General Gait Details: Patient with some improvements in overall stability and tolerated increased ambulation distance today with rest break. Continues to required hands on assist for balance and multi modal cues for positioning with RW Gait velocity: decreased Gait velocity interpretation: <1.31 ft/sec, indicative of household ambulator    ADL: ADL Overall ADL's : Needs assistance/impaired Eating/Feeding: Modified independent, Sitting Grooming: Wash/dry hands, Wash/dry face, Oral care, Set up, Sitting Upper Body Bathing: Minimal assistance, Sitting Lower Body Bathing: Moderate assistance, Sit to/from stand Upper Body Dressing : Set up, Sitting Lower Body Dressing: Moderate assistance, Sit to/from stand Lower Body Dressing Details (indicate cue type and reason): able to access feet, but requires assist to fully complete task  Toilet Transfer: Minimal assistance, Stand-pivot, BSC  Toileting- Clothing Manipulation and Hygiene: Minimal assistance, Sit to/from stand Functional mobility during ADLs: Minimal assistance  Cognition: Cognition Overall Cognitive Status: Impaired/Different from baseline Arousal/Alertness: Awake/alert Orientation Level: Oriented X4 Attention: Focused, Sustained Focused Attention: Appears intact Sustained Attention: Impaired Sustained Attention Impairment: Verbal complex Memory: Impaired Memory Impairment: Retrieval deficit, Decreased recall of new information(Immediate: 3/3; Delayed: 1/3; 0/2 with cues) Awareness: Impaired Awareness Impairment: Intellectual impairment Problem Solving: Impaired Problem Solving Impairment: Verbal complex(2/4) Cognition Arousal/Alertness: Awake/alert Behavior During Therapy: Flat affect Overall Cognitive Status: Impaired/Different from baseline Area of Impairment: Attention, Problem solving, Memory Current Attention Level: Selective Memory: Decreased short-term memory Problem Solving: Requires verbal cues General Comments: Pt is slow to process info requires cueing to recall info accurately   Physical Exam: Blood pressure (!) 142/74, pulse 79, temperature (!) 97.5 F (36.4 C), temperature source Oral, resp. rate 13, height 5\' 8"  (1.727 m), weight 67.7 kg, SpO2 98 %. Physical Exam  Vitals reviewed. Constitutional: She is oriented to person, place, and time. She appears well-developed and well-nourished.  HENT:  Head: Normocephalic and atraumatic.  Eyes: EOM are normal. Right eye exhibits no discharge. Left eye exhibits no discharge.  Respiratory: Effort normal. No respiratory distress.  GI: She exhibits no distension.  Musculoskeletal:     Comments: No edema or tenderness in extremities  Neurological: She is alert and oriented to person, place, and time.   Makes good eye contact with examiner.  Fair insight awareness of deficits.  Follows commands Motor: Grossly 4+/5 throughout  Skin: Skin is  warm and dry.  Psychiatric: She has a normal mood and affect. Her behavior is normal.    No results found for this or any previous visit (from the past 48 hour(s)). No results found.     Medical Problem List and Plan: 1.  Decreased functional mobility secondary to traumatic SAH  Admit to CIR 2.  Antithrombotics: -DVT/anticoagulation: SCDs  -antiplatelet therapy: Chronic Plavix and Eliquis held due to [facility] 3. Pain Management: Hydrocodone and Flexeril as needed 4. Mood: Zoloft 100 mg daily  -antipsychotic agents: N/A 5. Neuropsych: This patient is  capable of making decisions on her own behalf. 6. Skin/Wound Care: Routine skin checks 7. Fluids/Electrolytes/Nutrition: Routine in and outs.  BMP ordered for tomorrow morning. 8.  Hypertension.  Lopressor 25 mg twice daily, Cozaar 100 mg daily, Norvasc 2.5 mg daily.  Monitor with increased mobility. 9.  CAD with stenting.  Plavix currently on hold. 10.  Diastolic congestive heart failure.  Monitor for signs and symptoms of fluid overload. 11.  PAF.  Cardiac rate controlled.  Eliquis on hold.  Monitor heart rate with increased physical activity. 12.  Ovarian cancer.  Follow-up outpatient.chemotherapy currently on hold until rehabilitation completed for [facility] 13.  Diet-controlled diabetes mellitus.  Patient currently on a regular diet.  Monitor with increased activity. 14.  Hyperlipidemia.  Zetia/Lipitor 15.  Iron deficiency anemia.  Continue iron supplement.  Post Admission Physician Evaluation: 1. Preadmission assessment reviewed and changes made below. 2. Functional deficits secondary  to bilateral SAH. 3. Patient is admitted to receive collaborative, interdisciplinary care between the physiatrist, rehab nursing staff, and therapy team. 4. Patient's level of medical complexity and substantial therapy needs in context of that medical necessity cannot be provided at a lesser intensity of care such as a SNF. 5. Patient has experienced  substantial functional loss from his/her baseline which was documented above under the "Functional History" and "Functional Status" headings.  Judging by the patient's diagnosis, physical exam, and functional history, the patient has potential for functional progress which will result in measurable gains while on inpatient rehab.  These gains will be of substantial and practical use upon discharge  in facilitating mobility and self-care at the household level. 76. Physiatrist will provide 24 hour management of medical needs as well as oversight of the therapy plan/treatment and provide guidance as appropriate regarding the interaction of the two. 7. 24 hour rehab nursing will assist with safety, disease management and patient education  and help integrate therapy concepts, techniques,education, etc. 8. PT will assess and treat for/with: Lower extremity strength, range of motion, stamina, balance, functional mobility, safety, adaptive techniques and equipment, coping skills, pain control, education. Goals are: Mod I. 9. OT will assess and treat for/with: ADL's, functional mobility, safety, upper extremity strength, adaptive techniques and equipment, ego support, and community reintegration.   Goals are: Mod I. Therapy may proceed with showering this patient. 10. Case Management and Social Worker will assess and treat for psychological issues and discharge planning. 11. Team conference will be held weekly to assess progress toward goals and to determine barriers to discharge. 12. Patient will receive at least 3 hours of therapy per day at least 5 days per week. 13. ELOS: 6-9 days.       14. Prognosis:  excellent and good  I have personally performed a face to face diagnostic evaluation, including, but not limited to relevant history and physical exam findings, of this patient and developed relevant assessment and plan.  Additionally, I have reviewed and concur with the physician assistant's documentation  above.  Delice Lesch, MD, ABPMR Lavon Paganini Angiulli, PA-C 10/12/2018

## 2018-10-12 NOTE — Progress Notes (Signed)
Pt admitted to 4W03. Pt alert and oriented and requested to use the restroom. Vitals are stable and patient oriented to new room with call bell in reach.

## 2018-10-12 NOTE — Discharge Summary (Signed)
Discharge Summary  Date of Admission: 10/07/2018  Date of Discharge: 10/12/18  Attending Physician: Erline Levine, MD  Hospital Course: Patient was admitted after a fall with CT head showing a traumatic subarachnoid hemorrhage. Her eliquis and plavix were held and she was monitored without any further neurologic issues. She was evaluated by PT/OT, who recommended rehab placement. Her hospital course was uncomplicated and the patient was discharged to rehab on 10/12/2018. She will follow up in clinic with Dr. Vertell Limber in 2 weeks.  Neurologic exam at discharge:  AOx3, PERRL, EOMI, FS, TM Strength 5/5 x4, SILTx4  Discharge diagnosis: Traumatic subarachnoid hemorrhage  Judith Part, MD 10/12/18 10:37 AM

## 2018-10-12 NOTE — Telephone Encounter (Signed)
I called and left a message with daughter that I did let dr. Janese Banks know of her being in hospital and she had alredy seen. She feels that she can't give her anymore chemo. She took 4 treatments. She knows she is suppose to go ti inpatient rehab. Would like patient to have scan around 5/13 and see md on 5/15 9:45 for labs and see md 10:15.  I can't schedule scans while she is in patient in hospital and will work on getting it scheduled when she is discharged. I can send appt to home address but I know daughter lives somewhere else. I left her my cell phone if she needs to call me back . I will monitor to see when she gets out to schedule the scan and if the appt needs to be changed based on how she does with rehab

## 2018-10-12 NOTE — Discharge Instructions (Signed)
Discharge Instructions  No restriction in activities, slowly increase your activity back to normal.   Do not restart or clopidogrel (plavix) or apixiban (eliquis) until discussed with Dr. Vertell Limber at your 2 week follow up appointment.  Follow up with Dr. Vertell Limber in 2 weeks after discharge. If you do not already have a discharge appointment, please call his office at [phone] to schedule a follow up appointment. If you have any concerns or questions, please call the office and let us know.

## 2018-10-12 NOTE — Progress Notes (Signed)
Neurosurgery Service Progress Note  Subjective: No acute events overnight, no complaints this morning   Objective: Vitals:   10/11/18 1958 10/11/18 2310 10/12/18 0420 10/12/18 0756  BP: (!) 182/91 (!) 162/84 (!) 142/74 (!) 164/87  Pulse: 77 88 79 81  Resp:  13  (!) 22  Temp: 98.2 F (36.8 C) 98.5 F (36.9 C) (!) 97.5 F (36.4 C) 98.6 F (37 C)  TempSrc: Oral Oral Oral Oral  SpO2: 100% 99% 98% 99%  Weight:      Height:       Temp (24hrs), Avg:98.2 F (36.8 C), Min:97.5 F (36.4 C), Max:98.6 F (37 C)  CBC Latest Ref Rng & Units 10/07/2018 09/29/2018 09/08/2018  WBC 4.0 - 10.5 K/uL 10.5 7.7 5.1  Hemoglobin 12.0 - 15.0 g/dL 10.9(L) 11.1(L) 10.9(L)  Hematocrit 36.0 - 46.0 % 33.2(L) 32.7(L) 32.4(L)  Platelets 150 - 400 K/uL 128(L) 158 203   BMP Latest Ref Rng & Units 10/07/2018 09/29/2018 09/08/2018  Glucose 70 - 99 mg/dL 94 134(H) 140(H)  BUN 8 - 23 mg/dL 24(H) 25(H) 11  Creatinine 0.44 - 1.00 mg/dL 1.02(H) 1.14(H) 0.79  BUN/Creat Ratio 12 - 28 - - -  Sodium 135 - 145 mmol/L 141 141 141  Potassium 3.5 - 5.1 mmol/L 3.6 3.3(L) 2.9(L)  Chloride 98 - 111 mmol/L 107 111 113(H)  CO2 22 - 32 mmol/L 17(L) 21(L) 21(L)  Calcium 8.9 - 10.3 mg/dL 9.7 9.3 8.9   No intake or output data in the 24 hours ending 10/12/18 0855  Current Facility-Administered Medications:  .  0.9 % NaCl with KCl 20 mEq/ L  infusion, , Intravenous, Continuous, Erline Levine, MD, Last Rate: 75 mL/hr at 10/11/18 1658 .  acetaminophen (TYLENOL) tablet 650 mg, 650 mg, Oral, Q4H PRN, Erline Levine, MD .  amLODipine [facility]) tablet 2.5 mg, 2.5 mg, Oral, Daily, Erline Levine, MD, 2.5 mg at 10/11/18 0949 .  atorvastatin (LIPITOR) tablet 80 mg, 80 mg, Oral, QHS, Erline Levine, MD, 80 mg at 10/11/18 2252 .  cyclobenzaprine (FLEXERIL) tablet 10 mg, 10 mg, Oral, TID PRN, Erline Levine, MD .  ezetimibe (ZETIA) tablet 10 mg, 10 mg, Oral, Daily, Erline Levine, MD, 10 mg at 10/11/18 0948 .  ferrous sulfate tablet 325 mg, 325 mg,  Oral, Q breakfast, Erline Levine, MD, 325 mg at 10/11/18 [phone] .  hydrALAZINE (APRESOLINE) injection 5-20 mg, 5-20 mg, Intravenous, Q4H PRN, Costella, Vincent J, PA-C, 20 mg at 10/11/18 2011 .  HYDROcodone-acetaminophen (NORCO/VICODIN) 5-325 MG per tablet 1 tablet, 1 tablet, Oral, QHS PRN, Erline Levine, MD .  HYDROcodone-acetaminophen (NORCO/VICODIN) 5-325 MG per tablet 1 tablet, 1 tablet, Oral, Q4H PRN, Erline Levine, MD .  HYDROcodone-acetaminophen (NORCO/VICODIN) 5-325 MG per tablet 2 tablet, 2 tablet, Oral, Q4H PRN, Erline Levine, MD .  labetalol (NORMODYNE) injection 10-40 mg, 10-40 mg, Intravenous, Q10 min PRN, Costella, Vista Mink, PA-C .  lidocaine-prilocaine (EMLA) cream, , Topical, Once, Erline Levine, MD .  LORazepam (ATIVAN) tablet 0.5 mg, 0.5 mg, Oral, Q6H PRN, Erline Levine, MD .  losartan (COZAAR) tablet 100 mg, 100 mg, Oral, Daily, Erline Levine, MD, 100 mg at 10/11/18 0949 .  metoprolol tartrate (LOPRESSOR) tablet 25 mg, 25 mg, Oral, BID, Erline Levine, MD, 25 mg at 10/11/18 2252 .  multivitamin with minerals tablet 1 tablet, 1 tablet, Oral, Daily, Erline Levine, MD, 1 tablet at 10/11/18 [phone] .  nitroGLYCERIN (NITROSTAT) SL tablet 0.4 mg, 0.4 mg, Sublingual, Q5 min PRN, Erline Levine, MD .  ondansetron [facility]) tablet 4  mg, 4 mg, Oral, Q6H PRN **OR** ondansetron (ZOFRAN) injection 4 mg, 4 mg, Intravenous, Q6H PRN, Erline Levine, MD .  pantoprazole (PROTONIX) EC tablet 40 mg, 40 mg, Oral, Daily, Erline Levine, MD, 40 mg at 10/11/18 0949 .  potassium chloride SA (K-DUR) CR tablet 20 mEq, 20 mEq, Oral, Daily, Erline Levine, MD, 20 mEq at 10/11/18 0948 .  prochlorperazine (COMPAZINE) tablet 10 mg, 10 mg, Oral, Q6H PRN, Erline Levine, MD .  protein supplement (ENSURE MAX) liquid, 11 oz, Oral, BID BM, Erline Levine, MD, 11 oz at 10/11/18 1441 .  senna-docusate (Senokot-S) tablet 1 tablet, 1 tablet, Oral, Daily, Erline Levine, MD, 1 tablet at 10/11/18 [phone] .  sertraline (ZOLOFT) tablet 100  mg, 100 mg, Oral, Daily, Erline Levine, MD, 100 mg at 10/11/18 0949 .  Vitamin D (Ergocalciferol) (DRISDOL) capsule 50,000 Units, 50,000 Units, Oral, Crista Curb, MD, 50,000 Units at 10/10/18 6433   Physical Exam: AOx3 - stated she was confused about place but guessed that she was at [facility], PERRL, EOMI, FS, FCx4 without preference Some swelling and injection of conjunctiva OS  Assessment & Plan: 78 y.o. woman s/p fall with tSAH, recovering well.  -awaiting approval for rehab, okay for discharge to rehab when approved -no clear concerning features of left eye swelling, does not wear contacts, no photophobia / visual changes, could be early conjunctivitis, will order some lubricating drops for symptomatic relief, advised patient not to touch that eye and risk of spreading to the other eye  Judith Part  10/12/18 8:55 AM

## 2018-10-12 NOTE — Care Management Important Message (Signed)
Important Message  Patient Details  Name: Margaret [name] MRN: [id number] Date of Birth: 12-15-1940   Medicare Important Message Given:  Yes    Orbie Pyo 10/12/2018, 12:37 PM

## 2018-10-12 NOTE — Progress Notes (Signed)
Margaret Arn, MD  Physician  Physical Medicine and Rehabilitation  PMR Pre-admission  Signed  Date of Service:  10/11/2018 12:34 PM       Related encounter: Admission (Discharged) from 10/07/2018 in [facility]         Show:Clear all [x] Manual[x] Template[x] Copied  Added by: [x] Aakash Hollomon, Vertis Kelch, RN[x] Margaret Arn, MD  [] Hover for details PMR Admission Coordinator Pre-Admission Assessment  Patient: Margaret [name] is an 78 y.o., female MRN: [id number] DOB: 11/06/1940 Height: 5' 8"  (172.7 cm) Weight: 67.7 kg  Insurance Information HMO:     PPO: yes     PCP:      IPA:      80/20:      OTHER: medicare advantage plan PRIMARY: [employer]      Policy#: [id number]      Subscriber: pt CM Name: Marlowe Kays      Phone#: [phone]     Fax#: [phone] Pre-Cert#: [id number] approved for 7 days      Employer:  Benefits:  Phone #: [phone]     Name: 4/22 Eff. Date: 06/22/2018     Deduct: none      Out of Pocket Max: $3400  ( met [phone])    Life Max: none CIR: $295 co pay per day days 1 until 6     SNF: $20 co pay per day days 1 until 20; $160 co pay per day days 21 unit; 100 Outpatient: $15 co pay per visit     Co-Pay: visits per medical neccesity Home Health: 100%      Co-Pay: visits per medical neccesity DME: 80%     Co-Pay: 20% Providers: in network  SECONDARY: none       Medicaid Application Date:       Case Manager:  Disability Application Date:       Case Worker:   The "Data Collection Information Summary" for patients in Inpatient Rehabilitation Facilities with attached "Privacy Act [place] Records" was provided and verbally reviewed with: Patient and Family  Emergency Contact Information         Contact Information    Name Relation Home Work Mobile   Macha,William Spouse [phone]  [phone]   Barrett Henle Daughter [phone]  [phone]      Current Medical History  Patient  Admitting Diagnosis: SAH  History of Present Illness:Margaret [name] is a 78 year old right-handed female with history of CAD with stenting maintained on Plavix, diastolic congestive heart failure, diet-controlled diabetes mellitus, PAF maintained on Eliquis followed by Dr. Martinique as well as ovarian cancer maintained on chemotherapy.Presented 10/08/2018 after a fall a couple of days ago noted increased lethargy/fatigue. No nausea vomiting. Cranial CT scan showed small volume bilateral subarachnoid hemorrhage. Chronic ischemic changes including a large right PCA infarction. Patient's Eliquis and Plavix were held due to subarachnoid hemorrhage. Neurosurgery follow-up Dr. Vertell Limber advise conservative care with follow-up cranial CT scan stable. Anticoagulation currently remains on hold until further notice by cardiology services.Tolerating a regular diet.   Complete NIHSS TOTAL: 0  Patient's medical record from [facility]  has been reviewed by the rehabilitation admission coordinator and physician.  Past Medical History      Past Medical History:  Diagnosis Date  . Acetabular fracture ([place])   . Anxiety and depression   . Bartholin cyst   . CAD (coronary artery disease)   . 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  Cellulitis of right leg 2005  . CHF (congestive heart failure) ([place])   . Colon polyp   . Complication of anesthesia   . Diabetes mellitus without complication ([place])   . DJD (degenerative joint disease)   . Dyspnea on exertion   . Dysrhythmia    PAF  . GERD (gastroesophageal reflux disease)   . Hyperlipidemia   . Hypertension   . Morbid obesity ([place])   . Myocardial infarction ([place]) 06/14/2017  . Ovarian cancer ([place])   . PAF (paroxysmal atrial fibrillation) ([place])    a. diagnosed 7/19; b. CHADS2VASc 8; c. Eliquis  . PONV (postoperative nausea and vomiting)   . Presence of stent in right coronary artery 02/08/12   2.5x18  Xience distal RCA  . Right shoulder pain   . Stroke ([place]) 2017  . Vaginal prolapse     Family History   family history includes Cancer in her maternal grandfather; Colon cancer (age of onset: 20) in her mother; Diabetes in her brother, maternal grandmother, and sister; Heart attack in her father; Heart attack (age of onset: 21) in her brother and sister; Hypertension in her daughter and sister; Thyroid disease in her daughter.  Prior Rehab/Hospitalizations Has the patient had prior rehab or hospitalizations prior to admission? Yes  Has the patient had major surgery during 100 days prior to admission? No              Current Medications  Current Facility-Administered Medications:  .  0.9 % NaCl with KCl 20 mEq/ L  infusion, , Intravenous, Continuous, Erline Levine, MD, Last Rate: 75 mL/hr at 10/11/18 1658 .  acetaminophen (TYLENOL) tablet 650 mg, 650 mg, Oral, Q4H PRN, Erline Levine, MD .  amLODipine [facility]) tablet 2.5 mg, 2.5 mg, Oral, Daily, Erline Levine, MD, 2.5 mg at 10/12/18 0907 .  atorvastatin (LIPITOR) tablet 80 mg, 80 mg, Oral, QHS, Erline Levine, MD, 80 mg at 10/11/18 2252 .  cyclobenzaprine (FLEXERIL) tablet 10 mg, 10 mg, Oral, TID PRN, Erline Levine, MD .  ezetimibe (ZETIA) tablet 10 mg, 10 mg, Oral, Daily, Erline Levine, MD, 10 mg at 10/12/18 8338 .  ferrous sulfate tablet 325 mg, 325 mg, Oral, Q breakfast, Erline Levine, MD, 325 mg at 10/12/18 0909 .  hydrALAZINE (APRESOLINE) injection 5-20 mg, 5-20 mg, Intravenous, Q4H PRN, Costella, Vincent J, PA-C, 20 mg at 10/11/18 2011 .  HYDROcodone-acetaminophen (NORCO/VICODIN) 5-325 MG per tablet 1 tablet, 1 tablet, Oral, QHS PRN, Erline Levine, MD .  HYDROcodone-acetaminophen (NORCO/VICODIN) 5-325 MG per tablet 1 tablet, 1 tablet, Oral, Q4H PRN, Erline Levine, MD .  HYDROcodone-acetaminophen (NORCO/VICODIN) 5-325 MG per tablet 2 tablet, 2 tablet, Oral, Q4H PRN, Erline Levine, MD .  labetalol (NORMODYNE) injection 10-40 mg,  10-40 mg, Intravenous, Q10 min PRN, Costella, Vista Mink, PA-C .  lidocaine-prilocaine (EMLA) cream, , Topical, Once, Erline Levine, MD .  LORazepam (ATIVAN) tablet 0.5 mg, 0.5 mg, Oral, Q6H PRN, Erline Levine, MD .  losartan (COZAAR) tablet 100 mg, 100 mg, Oral, Daily, Erline Levine, MD, 100 mg at 10/12/18 0906 .  metoprolol tartrate (LOPRESSOR) tablet 25 mg, 25 mg, Oral, BID, Erline Levine, MD, 25 mg at 10/12/18 0906 .  multivitamin with minerals tablet 1 tablet, 1 tablet, Oral, Daily, Erline Levine, MD, 1 tablet at 10/12/18 [phone] .  nitroGLYCERIN (NITROSTAT) SL tablet 0.4 mg, 0.4 mg, Sublingual, Q5 min PRN, Erline Levine, MD .  ondansetron [facility]) tablet 4 mg, 4 mg, Oral, Q6H PRN **OR** ondansetron (ZOFRAN) injection 4 mg, 4 mg, Intravenous,  Q6H PRN, Erline Levine, MD .  pantoprazole (PROTONIX) EC tablet 40 mg, 40 mg, Oral, Daily, Erline Levine, MD, 40 mg at 10/12/18 0907 .  polyvinyl alcohol (LIQUIFILM TEARS) 1.4 % ophthalmic solution 1 drop, 1 drop, Left Eye, QID PRN, Judith Part, MD .  potassium chloride SA (K-DUR) CR tablet 20 mEq, 20 mEq, Oral, Daily, Erline Levine, MD, 20 mEq at 10/12/18 0906 .  prochlorperazine (COMPAZINE) tablet 10 mg, 10 mg, Oral, Q6H PRN, Erline Levine, MD .  protein supplement (ENSURE MAX) liquid, 11 oz, Oral, BID BM, Erline Levine, MD, 11 oz at 10/12/18 0906 .  senna-docusate (Senokot-S) tablet 1 tablet, 1 tablet, Oral, Daily, Erline Levine, MD, 1 tablet at 10/12/18 [phone] .  sertraline (ZOLOFT) tablet 100 mg, 100 mg, Oral, Daily, Erline Levine, MD, 100 mg at 10/12/18 0907 .  Vitamin D (Ergocalciferol) (DRISDOL) capsule 50,000 Units, 50,000 Units, Oral, Crista Curb, MD, 50,000 Units at 10/10/18 9833  Patients Current Diet:     Diet Order                  Diet regular Room service appropriate? Yes with Assist; Fluid consistency: Thin  Diet effective now               Precautions / Restrictions Precautions Precautions: Fall  Restrictions Weight Bearing Restrictions: No   Has the patient had 2 or more falls or a fall with injury in the past year? Yes  Prior Activity Level Limited Community (1-2x/wk): decline in function since begining chemo 3 months ago  Prior Functional Level Self Care: Did the patient need help bathing, dressing, using the toilet or eating? Needed some help  Indoor Mobility: Did the patient need assistance with walking from room to room (with or without device)? Independent  Stairs: Did the patient need assistance with internal or external stairs (with or without device)? Needed some help  Functional Cognition: Did the patient need help planning regular tasks such as shopping or remembering to take medications? Needed some help  Home Assistive Devices / [place] Devices/Equipment: Cane (specify quad or straight), Walker (specify type) Home Equipment: None  Prior Device Use: Indicate devices/aids used by the patient prior to current illness, exacerbation or injury? Walker  Current Functional Level Cognition  Arousal/Alertness: Awake/alert Overall Cognitive Status: Impaired/Different from baseline Current Attention Level: Selective Orientation Level: Oriented X4 General Comments: Pt is slow to process info requires cueing to recall info accurately  Attention: Focused, Sustained Focused Attention: Appears intact Sustained Attention: Impaired Sustained Attention Impairment: Verbal complex Memory: Impaired Memory Impairment: Retrieval deficit, Decreased recall of new information(Immediate: 3/3; Delayed: 1/3; 0/2 with cues) Awareness: Impaired Awareness Impairment: Intellectual impairment Problem Solving: Impaired Problem Solving Impairment: Verbal complex(2/4)    Extremity Assessment (includes Sensation/Coordination)  Upper Extremity Assessment: Generalized weakness  Lower Extremity Assessment: Generalized weakness    ADLs  Overall ADL's : Needs  assistance/impaired Eating/Feeding: Modified independent, Sitting Grooming: Wash/dry hands, Wash/dry face, Oral care, Set up, Sitting Upper Body Bathing: Minimal assistance, Sitting Lower Body Bathing: Moderate assistance, Sit to/from stand Upper Body Dressing : Set up, Sitting Lower Body Dressing: Moderate assistance, Sit to/from stand Lower Body Dressing Details (indicate cue type and reason): able to access feet, but requires assist to fully complete task  Toilet Transfer: Minimal assistance, Stand-pivot, BSC Toileting- Clothing Manipulation and Hygiene: Minimal assistance, Sit to/from stand Functional mobility during ADLs: Minimal assistance    Mobility  Overal bed mobility: Needs Assistance Bed Mobility: Sit to  Supine Rolling: Min guard Sidelying to sit: Min assist Sit to supine: Min guard General bed mobility comments: increased time     Transfers  Overall transfer level: Needs assistance Equipment used: Rolling walker (2 wheeled) Transfers: Sit to/from Stand, [employer] Transfers Sit to Stand: Min assist Stand pivot transfers: Min assist General transfer comment: assist to steady     Ambulation / Gait / Stairs / Emergency planning/management officer  Ambulation/Gait Ambulation/Gait assistance: Herbalist (Feet): 70 Feet Assistive device: Rolling walker (2 wheeled) Gait Pattern/deviations: Shuffle, Staggering right, Drifts right/left, Narrow base of support General Gait Details: Patient with some improvements in overall stability and tolerated increased ambulation distance today with rest break. Continues to required hands on assist for balance and multi modal cues for positioning with RW Gait velocity: decreased Gait velocity interpretation: <1.31 ft/sec, indicative of household ambulator    Posture / Balance Balance Overall balance assessment: History of Falls, Needs assistance Sitting-balance support: Feet supported Sitting balance-Leahy Scale: Fair Standing  balance support: During functional activity Standing balance-Leahy Scale: Poor    Special needs/care consideration BiPAP/CPAP  N/a CPM  N/a Continuous Drip IV  IVF at 75 cc/hr via porto cath Dialysis n/a Life Vest  N/a Oxygen  N/a Special Bed  N/a Trach Size  N/a Wound Vac n/a Skin intact                            Bowel mgmt: 4/20; continent Bladder mgmt: continent Diabetic mgmt:  N/a Behavioral consideration  Daughter feels pt is depressed Chemo/radiation  Yes active chemotherapy pta for ovarian cancer   Previous Home Environment  Living Arrangements: Spouse/significant other  Lives With: Spouse Available Help at Discharge: Family, Available 24 hours/day Type of Home: House Home Layout: One level Home Access: Stairs to enter Entrance Stairs-Rails: Can reach both Entrance Stairs-Number of Steps: 4 Bathroom Shower/Tub: Multimedia programmer: Programmer, systems: Yes Home Care Services: No  Discharge Living Setting Plans for Discharge Living Setting: Patient's home, Lives with (comment)(spouse) Type of Home at Discharge: House Discharge Home Layout: One level Discharge Home Access: Stairs to enter Entrance Stairs-Rails: Can reach both Entrance Stairs-Number of Steps: 4 Discharge Bathroom Shower/Tub: Walk-in shower Discharge Bathroom Toilet: Standard Does the patient have any problems obtaining your medications?: No  Social/Family/Support Systems Patient Roles: Spouse, Parent Contact Information: spouse, Gwyndolyn Saxon and daughter, [name] Anticipated Caregiver: spouse and daughter Anticipated Caregiver's Contact Information: see above Ability/Limitations of Caregiver: daughter Therapist, sports at [facility], spouse availalble 24/7 Caregiver Availability: 24/7 Discharge Plan Discussed with Primary Caregiver: Yes Is Caregiver In Agreement with Plan?: Yes Does Caregiver/Family have Issues with Lodging/Transportation while Pt is in Rehab?: No  Goals/Additional Needs  Patient/Family Goal for Rehab: supervision to min PT and OT, supervision SLP Expected length of stay: ELOS 10 to 12 days Special Service Needs: Patietn was receivieng chemotherapy for ovarian cancer; now on hold Pt/Family Agrees to Admission and willing to participate: Yes Program Orientation Provided & Reviewed with Pt/Caregiver Including Roles  & Responsibilities: Yes  Decrease burden of Care through IP rehab admission: n/a  Possible need for SNF placement upon discharge: not anticipated  Patient Condition: I have reviewed medical records from [facility] , spoken with CM, and patient, spouse and daughter. I met with patient at the bedside for inpatient rehabilitation assessment.  Patient will benefit from ongoing PT and OT, can actively participate in 3 hours of therapy a day 5 days of the week,  and can make measurable gains during the admission.  Patient will also benefit from the coordinated team approach during an Inpatient Acute Rehabilitation admission.  The patient will receive intensive therapy as well as Rehabilitation physician, nursing, social worker, and care management interventions.  Due to safety, skin/wound care, disease management, medication administration and patient education the patient requires 24 hour a day rehabilitation nursing.  The patient is currently mod assist to min with mobility and basic ADLs.  Discharge setting and therapy post discharge at home with home health is anticipated.  Patient has agreed to participate in the Acute Inpatient Rehabilitation Program and will admit today.  Preadmission Screen Completed By:  Annamary Rummage MSN 10/12/2018 12:21 PM ______________________________________________________________________   Discussed status with Dr. Posey Pronto on  10/12/2018 at  1220 and received approval for admission today.  Admission Coordinator:  Mycala, Warshawsky, RN MSN, time  4163 Date  10/12/2018   Assessment/Plan: Diagnosis: SAH   1. Does the need for close, 24 hr/day Medical supervision in concert with the patient's rehab needs make it unreasonable for this patient to be served in a less intensive setting? Yes  2. Co-Morbidities requiring supervision/potential complications:  CAD with stenting maintained on Plavix, diastolic congestive heart failure, diet-controlled diabetes mellitus, PAF maintained on Eliquis followed by Dr. Martinique as well as ovarian cancer maintained on chemotherapy 3. Due to safety, disease management and patient education, does the patient require 24 hr/day rehab nursing? Yes 4. Does the patient require coordinated care of a physician, rehab nurse, PT (1-2 hrs/day, 5 days/week) and OT (1-2 hrs/day, 5 days/week) to address physical and functional deficits in the context of the above medical diagnosis(es)? Yes Addressing deficits in the following areas: balance, endurance, locomotion, strength, transferring, bathing, dressing, toileting and psychosocial support 5. Can the patient actively participate in an intensive therapy program of at least 3 hrs of therapy 5 days a week? Yes 6. The potential for patient to make measurable gains while on inpatient rehab is excellent 7. Anticipated functional outcomes upon discharge from inpatients are: modified independent and supervision PT, modified independent and supervision OT, n/a SLP 8. Estimated rehab length of stay to reach the above functional goals is: 11-13 days. 9. Anticipated D/C setting: Home 10. Anticipated post D/C treatments: HH therapy and Home excercise program 11. Overall Rehab/Functional Prognosis: good  MD Signature: Delice Lesch, MD, ABPMR        Revision History

## 2018-10-12 NOTE — Telephone Encounter (Signed)
I have called her daughter and left message that you will not give her chemo with her current diagnosis. Also would like to see her back in 3 weeks with ct scan before hand. I can't make ct appt right now because she is currently in hosp. But I will be checking back up with her status to know when I can schedule ct scan. If patient not doing well closer to the date of appt. To please call us and let us know.

## 2018-10-12 NOTE — Progress Notes (Signed)
Inpatient Rehabilitation Admissions Coordinator  I have received approval from [employer] for an inpatient acute rehabilitation admission. Bed is available today. I met with patient at bedside and she is in agreement to admit. I have notified Dr. Joaquim Nam by phone to arrange discharge to CIR today. I have notified RN CM/SW  Almyra Free and Michiel Cowboy  of plans to admit. I will make the arrangements to admit today. I contacted pt's daughter by phone and will contact pt's spouse.  Danne Baxter, RN, MSN Rehab Admissions Coordinator [phone] 10/12/2018 10:36 AM

## 2018-10-13 ENCOUNTER — Inpatient Hospital Stay (HOSPITAL_COMMUNITY): Payer: PPO

## 2018-10-13 ENCOUNTER — Inpatient Hospital Stay: Payer: PPO

## 2018-10-13 ENCOUNTER — Inpatient Hospital Stay: Payer: PPO | Admitting: Oncology

## 2018-10-13 ENCOUNTER — Inpatient Hospital Stay (HOSPITAL_COMMUNITY): Payer: PPO | Admitting: Physical Therapy

## 2018-10-13 DIAGNOSIS — D508 Other iron deficiency anemias: Secondary | ICD-10-CM

## 2018-10-13 DIAGNOSIS — S065X0S Traumatic subdural hemorrhage without loss of consciousness, sequela: Secondary | ICD-10-CM

## 2018-10-13 DIAGNOSIS — E119 Type 2 diabetes mellitus without complications: Secondary | ICD-10-CM

## 2018-10-13 LAB — COMPREHENSIVE METABOLIC PANEL
ALT: 11 U/L (ref 0–44)
AST: 20 U/L (ref 15–41)
Albumin: 3 g/dL — ABNORMAL LOW (ref 3.5–5.0)
Alkaline Phosphatase: 143 U/L — ABNORMAL HIGH (ref 38–126)
Anion gap: 10 (ref 5–15)
BUN: 6 mg/dL — ABNORMAL LOW (ref 8–23)
CO2: 20 mmol/L — ABNORMAL LOW (ref 22–32)
Calcium: 9.3 mg/dL (ref 8.9–10.3)
Chloride: 109 mmol/L (ref 98–111)
Creatinine, Ser: 0.8 mg/dL (ref 0.44–1.00)
GFR calc Af Amer: >60 mL/min (ref >60)
GFR calc non Af Amer: >60 mL/min (ref >60)
Glucose, Bld: 94 mg/dL (ref 70–99)
Potassium: 3.6 mmol/L (ref 3.5–5.1)
Sodium: 139 mmol/L (ref 135–145)
Total Bilirubin: 0.9 mg/dL (ref 0.3–1.2)
Total Protein: 5.1 g/dL — ABNORMAL LOW (ref 6.5–8.1)

## 2018-10-13 LAB — CBC WITH DIFFERENTIAL/PLATELET
Abs Immature Granulocytes: 0.67 10*3/uL — ABNORMAL HIGH (ref 0.00–0.07)
Basophils Absolute: 0.1 10*3/uL (ref 0.0–0.1)
Basophils Relative: 1 %
Eosinophils Absolute: 0.1 10*3/uL (ref 0.0–0.5)
Eosinophils Relative: 1 %
HCT: 28.3 % — ABNORMAL LOW (ref 36.0–46.0)
Hemoglobin: 9.5 g/dL — ABNORMAL LOW (ref 12.0–15.0)
Immature Granulocytes: 6 %
Lymphocytes Relative: 12 %
Lymphs Abs: 1.4 10*3/uL (ref 0.7–4.0)
MCH: 30.8 pg (ref 26.0–34.0)
MCHC: 33.6 g/dL (ref 30.0–36.0)
MCV: 91.9 fL (ref 80.0–100.0)
Monocytes Absolute: 0.6 10*3/uL (ref 0.1–1.0)
Monocytes Relative: 5 %
Neutro Abs: 8.7 10*3/uL — ABNORMAL HIGH (ref 1.7–7.7)
Neutrophils Relative %: 75 %
Platelets: 136 10*3/uL — ABNORMAL LOW (ref 150–400)
RBC: 3.08 MIL/uL — ABNORMAL LOW (ref 3.87–5.11)
RDW: 16.2 % — ABNORMAL HIGH (ref 11.5–15.5)
WBC: 11.5 10*3/uL — ABNORMAL HIGH (ref 4.0–10.5)
nRBC: 0 % (ref 0.0–0.2)

## 2018-10-13 LAB — OCCULT BLOOD X 1 CARD TO LAB, STOOL: Fecal Occult Bld: NEGATIVE

## 2018-10-13 MED ORDER — PRO-STAT SUGAR FREE PO LIQD
30.0000 mL | Freq: Two times a day (BID) | ORAL | Status: DC
  Administered 2018-10-14 – 2018-10-25 (×23): 30 mL via ORAL
  Filled 2018-10-13 (×24): qty 30

## 2018-10-13 NOTE — Progress Notes (Signed)
Patient slept fairly well this shift. Pt noted anxious at times immediately upon awakening. When approached by this Probation officer, pt denies that she is having any issues. Reports feeling dizzy this morning upon awakening, vital signs stable. Will continue to monitor.

## 2018-10-13 NOTE — Progress Notes (Signed)
Margaret [name] PHYSICAL MEDICINE & REHABILITATION PROGRESS NOTE   Subjective/Complaints: Had a reasonable night. Became nauseas this morning. Didn't eat breakfast. Admits to being anxious about therapy  ROS: Patient denies fever, rash, sore throat, blurred vision, nausea, vomiting, diarrhea, cough, shortness of breath or chest pain, joint or back pain, headache, or mood change.   Objective:   No results found. Recent Labs    10/13/18 0529  WBC 11.5*  HGB 9.5*  HCT 28.3*  PLT 136*   Recent Labs    10/13/18 0529  NA 139  K 3.6  CL 109  CO2 20*  GLUCOSE 94  BUN 6*  CREATININE 0.80  CALCIUM 9.3    Intake/Output Summary (Last 24 hours) at 10/13/2018 1610 Last data filed at 10/12/2018 1900 Gross per 24 hour  Intake 120 ml  Output -  Net 120 ml     Physical Exam: Vital Signs Blood pressure 140/75, pulse 78, temperature 98 F (36.7 C), temperature source Oral, resp. rate 18, height 5\' 8"  (1.727 m), weight 68.7 kg, SpO2 100 %. Constitutional: She is oriented to person, place, and time. She appears well-developed and well-nourished.  HENT:  Head: Normocephalic and atraumatic.  Eyes: EOM are normal. Right eye exhibits no discharge. Left eye exhibits no discharge.  Respiratory: Effort normal. No respiratory distress.  GI: She exhibits no distension.  Musculoskeletal:     Comments: No edema or tenderness in extremities  Neurological: She is alert and oriented to person, place, and time.   Makes good eye contact with examiner.  Fair insight awareness of deficits.  Follows commands Motor: Grossly 4+/5 throughout  Skin: Skin is warm and dry.  Psychiatric: She has a normal mood and affect. Her behavior is normal.     Assessment/Plan: 1. Functional deficits secondary to traumatic SAH which require 3+ hours per day of interdisciplinary therapy in a comprehensive inpatient rehab setting.  Physiatrist is providing close team supervision and 24 hour management of active medical  problems listed below.  Physiatrist and rehab team continue to assess barriers to discharge/monitor patient progress toward functional and medical goals  Care Tool:  Bathing              Bathing assist       Upper Body Dressing/Undressing Upper body dressing        Upper body assist      Lower Body Dressing/Undressing Lower body dressing            Lower body assist       Toileting Toileting    Toileting assist Assist for toileting: Minimal Assistance - Patient > 75%     Transfers Chair/bed transfer  Transfers assist     Chair/bed transfer assist level: Minimal Assistance - Patient > 75%     Locomotion Ambulation   Ambulation assist              Walk 10 feet activity   Assist           Walk 50 feet activity   Assist           Walk 150 feet activity   Assist           Walk 10 feet on uneven surface  activity   Assist           Wheelchair     Assist               Wheelchair 50 feet with 2 turns activity    Assist  Wheelchair 150 feet activity     Assist          Medical Problem List and Plan: 1.  Decreased functional mobility secondary to traumatic SAH             --Patient is beginning CIR therapies today including PT, OT, and SLP  2.  Antithrombotics: -DVT/anticoagulation: SCDs             -antiplatelet therapy: Chronic Plavix and Eliquis held due to [facility] 3. Pain Management: Hydrocodone and Flexeril as needed 4. Mood: Zoloft 100 mg daily             -antipsychotic agents: N/A 5. Neuropsych: This patient is capable of making decisions on her own behalf. 6. Skin/Wound Care: Routine skin checks 7. Fluids/Electrolytes/Nutrition:  -encourage po  -I personally reviewed the patient's labs today.  Wnl.  -protein supp for low albumin 8.  Hypertension.  Lopressor 25 mg twice daily, Cozaar 100 mg daily, Norvasc 2.5 mg daily.  Monitor with increased mobility. 9.  CAD with  stenting.  Plavix currently on hold. 10.  Diastolic congestive heart failure.  Monitor for signs and symptoms of fluid overload. 11.  PAF.  Cardiac rate controlled.  Eliquis on hold.  Monitor heart rate with increased physical activity. 12.  Ovarian cancer.  Follow-up outpatient.chemotherapy currently on hold until rehabilitation completed for [facility] 13.  Diet-controlled diabetes mellitus.  Patient currently on a regular diet.  Monitor with increased activity. 14.  Hyperlipidemia.  Zetia/Lipitor 15.  Iron deficiency anemia.  Continue iron supplement.   -stools reported black  - hgb drifting down over the last 2 weeks  -heme check stool  -cbc in am 16. Nausea: may be anxiety related, ?meds  -treat symptomatically for now    LOS: 1 days A FACE TO FACE EVALUATION WAS PERFORMED  Meredith Staggers 10/13/2018, 8:21 AM

## 2018-10-13 NOTE — Progress Notes (Signed)
Patient information reviewed and entered into eRehab System by Becky Rhea Thrun, PPS coordinator. Information including medical coding, function ability, and quality indicators will be reviewed and updated through discharge.   

## 2018-10-13 NOTE — Evaluation (Signed)
Physical Therapy Assessment and Plan  Patient Details  Name: Margaret [name] MRN: [id number] Date of Birth: 18-Oct-1940  PT Diagnosis: Abnormal posture, Abnormality of gait, Coordination disorder, Difficulty walking and Muscle weakness Rehab Potential: Excellent ELOS: 7-10 days    Today's Date: 10/13/2018 PT Individual Time: 3785-8850 PT Individual Time Calculation (min): 56 min    Problem List:  Patient Active Problem List   Diagnosis Date Noted  . Traumatic subdural hematoma ([place]) 10/12/2018  . Acute blood loss anemia   . Diabetes mellitus type 2 in nonobese (HCC)   . Malignant neoplasm of ovary ([place])   . PAF (paroxysmal atrial fibrillation) ([place])   . Chronic diastolic congestive heart failure ([place])   . Coronary artery disease involving native coronary artery of native heart without angina pectoris   . Subarachnoid hemorrhage ([place]) 10/08/2018  . Right ovarian cyst 08/12/2018  . Accelerated hypertension 07/31/2018  . Elevated lactic acid level 07/31/2018  . Hypertensive urgency 07/31/2018  . Goals of care, counseling/discussion 07/22/2018  . Primary high grade serous adenocarcinoma of ovary ([place]) 07/13/2018  . Controlled type 2 diabetes mellitus without complication, without long-term current use of insulin ([place]) 02/02/2018  . Bradycardia 01/13/2018  . Essential hypertension 11/04/2017  . Other hyperlipidemia 11/04/2017  . Hx of gastric bypass 11/04/2017  . NSTEMI (non-ST elevated myocardial infarction) ([place]) 06/14/2017  . History of ischemic right PCA stroke 03/27/2016  . Hemianopia, homonymous, left 01/31/2016  . Stroke ([place]) 11/06/2015  . Arthritis of knee, degenerative 05/04/2013  . Diastolic dysfunction 27/74/1287  . CAD S/P percutaneous coronary angioplasty   . Morbid obesity ([place])   . Right shoulder pain   . Anxiety and depression   . Dyspnea on exertion   . Cellulitis of right leg     Past Medical History:  Past Medical History:  Diagnosis Date  . Acetabular  fracture ([place])   . Anxiety and depression   . Bartholin cyst   . CAD (coronary artery disease)   . Cancer ([place])    mouth cyst was cancer many years ago   . Cellulitis of right leg 2005  . CHF (congestive heart failure) ([place])   . Colon polyp   . Complication of anesthesia   . Diabetes mellitus without complication ([place])   . DJD (degenerative joint disease)   . Dyspnea on exertion   . Dysrhythmia    PAF  . GERD (gastroesophageal reflux disease)   . Hyperlipidemia   . Hypertension   . Morbid obesity ([place])   . Myocardial infarction ([place]) 06/14/2017  . Ovarian cancer ([place])   . PAF (paroxysmal atrial fibrillation) ([place])    a. diagnosed 7/19; b. CHADS2VASc 8; c. Eliquis  . PONV (postoperative nausea and vomiting)   . Presence of stent in right coronary artery 02/08/12   2.5x18 Xience distal RCA  . Right shoulder pain   . Stroke ([place]) 2017  . Vaginal prolapse    Past Surgical History:  Past Surgical History:  Procedure Laterality Date  . ABDOMINAL HYSTERECTOMY     supracervical abdominal w/removal tubes &/or ovaries  . BREAST BIOPSY  1989  . CHOLECYSTECTOMY    . COLONOSCOPY    . COLONOSCOPY WITH PROPOFOL N/A 06/21/2018   Procedure: COLONOSCOPY WITH PROPOFOL;  Surgeon: Lollie Sails, MD;  Location: [facility] ENDOSCOPY;  Service: Endoscopy;  Laterality: N/A;  . CORONARY STENT INTERVENTION N/A 06/14/2017   Procedure: CORONARY STENT INTERVENTION;  Surgeon: Wellington Hampshire, MD;  Location: [place] CV LAB;  Service: Cardiovascular;  Laterality: N/A;  . ESOPHAGOGASTRODUODENOSCOPY N/A 06/21/2018   Procedure: ESOPHAGOGASTRODUODENOSCOPY (EGD);  Surgeon: Lollie Sails, MD;  Location: [facility] ENDOSCOPY;  Service: Endoscopy;  Laterality: N/A;  . FACIAL COSMETIC SURGERY     face/neck lift  . FRACTURE SURGERY    . GASTRIC BYPASS  2008  . JOINT REPLACEMENT    . KNEE SURGERY     left knee arthroscopic  . LAPAROSCOPIC BILATERAL SALPINGO OOPHERECTOMY Bilateral 06/24/2018   Procedure:  LAPAROSCOPIC BILATERAL SALPINGO OOPHORECTOMY;  Surgeon: Schermerhorn, Gwen Her, MD;  Location: [facility] ORS;  Service: Gynecology;  Laterality: Bilateral;  . LEFT HEART CATH AND CORONARY ANGIOGRAPHY N/A 06/14/2017   Procedure: LEFT HEART CATH AND CORONARY ANGIOGRAPHY;  Surgeon: Wellington Hampshire, MD;  Location: [place] CV LAB;  Service: Cardiovascular;  Laterality: N/A;  . LEFT HEART CATH AND CORONARY ANGIOGRAPHY N/A 06/14/2017   Procedure: LEFT HEART CATH AND CORONARY ANGIOGRAPHY;  Surgeon: Wellington Hampshire, MD;  Location: [place] CV LAB;  Service: Cardiovascular;  Laterality: N/A;  . LYSIS OF ADHESION  06/24/2018   Procedure: LYSIS OF ADHESION;  Surgeon: Ouida Sills Gwen Her, MD;  Location: [facility] ORS;  Service: Gynecology;;  . Crisoforo [place]   left hip fracture/repair and left arm fracture  . OTHER SURGICAL HISTORY  1986   hysterectomy  . PORTA CATH INSERTION N/A 07/26/2018   Procedure: PORTA CATH INSERTION;  Surgeon: Katha Cabal, MD;  Location: [place] CV LAB;  Service: Cardiovascular;  Laterality: N/A;  . right knee  late 90's   arthroscopic  . ROUX-EN-Y PROCEDURE    . SALIVARY GLAND SURGERY    . TOTAL HIP ARTHROPLASTY  2004   left    Assessment & Plan Clinical Impression:  Margaret [name] is a 78 year old right-handed female with history of CAD with stenting maintained on Plavix, diastolic congestive heart failure, diet-controlled diabetes mellitus, PAF maintained on Eliquis followed by Dr. Martinique as well as ovarian cancer maintained on chemotherapy.  Patient lives with spouse.  One level home 4 steps to entry.  Ambulates with a rolling walker.  Needed assist with some household tasks from husband.  History taken from chart review and patient.  She presented on 10/08/2018 after a fall few days PTA with increased lethargy.  No nausea vomiting.  Cranial CT scan reviewed, showing small bilateral subarachnoid hemorrhages, right greater than left.  Chronic ischemic changes including a  large right PCA infarction.  Eliquis and Plavix were held due to [facility].  Neurosurgery follow-up Dr. Vertell Limber recommended conservative care with follow-up head CT.  Follow-up head CT on 10/08/2018 reviewed, stable.  Recommendations per cardiology, to continue to hold anticoagulation.  Tolerating a regular diet.  Therapy evaluations completed with recommendations of physical medicine rehab consult.  Please see preadmission assessment from earlier today as well. Patient transferred to CIR on 10/12/2018 .   Patient currently requires min with mobility secondary to muscle weakness, decreased cardiorespiratoy endurance, ataxia and decreased coordination, decreased motor planning, decreased problem solving and decreased safety awareness and decreased standing balance and decreased balance strategies.  Prior to hospitalization, patient was modified independent  with mobility and lived with Spouse in a House home.  Home access is 4Stairs to enter.  Patient will benefit from skilled PT intervention to maximize safe functional mobility and minimize fall risk for planned discharge home with 24 hour supervision.  Anticipate patient will benefit from follow up [place] at discharge.  PT - End of Session Activity Tolerance: Tolerates 30+ min activity with multiple rests  Endurance Deficit: Yes PT Assessment Rehab Potential (ACUTE/IP ONLY): Excellent PT Patient demonstrates impairments in the following area(s): Balance;Safety;Endurance;Motor PT Transfers Functional Problem(s): Bed Mobility;Bed to Chair;Car;Furniture;Floor PT Locomotion Functional Problem(s): Ambulation;Wheelchair Mobility;Stairs PT Plan PT Intensity: Minimum of 1-2 x/day ,45 to 90 minutes PT Frequency: 5 out of 7 days PT Duration Estimated Length of Stay: 7-10 days  PT Treatment/Interventions: Ambulation/gait training;Cognitive remediation/compensation;Discharge planning;DME/adaptive equipment instruction;Functional mobility training;Pain  management;Psychosocial support;Splinting/orthotics;Therapeutic Activities;UE/LE Strength taining/ROM;Visual/perceptual remediation/compensation;Balance/vestibular training;Community reintegration;Disease management/prevention;Functional electrical stimulation;Neuromuscular re-education;Patient/family education;Skin care/wound management;Stair training;Therapeutic Exercise;UE/LE Coordination activities;Wheelchair propulsion/positioning PT Transfers Anticipated Outcome(s): S, LRAD  PT Locomotion Anticipated Outcome(s): S, LRAD  PT Recommendation Recommendations for Other Services: Neuropsych consult Follow Up Recommendations: Home health PT;24 hour supervision/assistance Patient destination: Home Equipment Recommended: Rolling walker with 5" wheels  Skilled Therapeutic Intervention Patient received in bed, asleep but easily woken and willing to participate in session; note poor frustration tolerance, patient required extensive motivation, encouragement, and time for rapport building prior to initiating evaluation today. Able to complete all functional mobility with levels of assist as specifically described below and ranging from min guard to MinA with RW. Easily fatigued, requires cues for safety throughout session. Due to identified functional deficits, patient will strongly benefit from intensive therapies in the CIR setting prior to return home.   PT Evaluation Precautions/Restrictions Precautions Precautions: Fall Precaution Comments: low frustration tolerance  Restrictions Weight Bearing Restrictions: No General Chart Reviewed: Yes Family/Caregiver Present: No Vital SignsTherapy Vitals Temp: 98.5 F (36.9 C) Temp Source: Oral Pulse Rate: 74 Resp: 17 BP: (!) 163/85 Patient Position (if appropriate): Lying Oxygen Therapy SpO2: 98 % O2 Device: Room Air Pain Pain Assessment Pain Scale: 0-10 Pain Score: 0-No pain Home Living/Prior Functioning Home Living Available Help at  Discharge: Family;Available 24 hours/day Type of Home: House Home Access: Stairs to enter [employer] of Steps: 4 Entrance Stairs-Rails: Can reach both Home Layout: One level Bathroom Shower/Tub: Multimedia programmer: Standard Bathroom Accessibility: Yes  Lives With: Spouse Prior Function Level of Independence: Independent with basic ADLs;Independent with homemaking with ambulation;Independent with gait;Independent with transfers;Requires assistive device for independence  Able to Take Stairs?: Yes Driving: Yes(limited ) Vocation: Retired Comments: patient reports she has a Neurosurgeon that she uses for mobility Vision/Perception  Vision - Assessment Eye Alignment: Within Functional Limits Ocular Range of Motion: Within Functional Limits Alignment/Gaze Preference: Within Defined Limits Tracking/Visual Pursuits: Able to track stimulus in all quads without difficulty Perception Perception: Within Functional Limits Praxis Praxis: Intact  Cognition Overall Cognitive Status: Impaired/Different from baseline Arousal/Alertness: Awake/alert Orientation Level: Oriented to place;Oriented to person;Oriented to time Attention: Sustained Focused Attention: Appears intact Sustained Attention: Impaired Sustained Attention Impairment: Verbal complex;Functional complex Memory: Impaired Memory Impairment: Retrieval deficit;Decreased recall of new information Awareness: Impaired Awareness Impairment: Intellectual impairment Problem Solving: Impaired Problem Solving Impairment: Verbal complex;Functional complex Behaviors: Poor frustration tolerance Safety/Judgment: Impaired [employer] Scales of Cognitive Functioning: Purposeful/appropriate Sensation Sensation Light Touch: Appears Intact Hot/Cold: Not tested Proprioception: Appears Intact Stereognosis: Not tested Coordination Gross Motor Movements are Fluid and Coordinated: No Fine Motor Movements are  Fluid and Coordinated: No Finger Nose Finger Test: mild ataxia R UE  Heel Shin Test: mild ataxia R LE  Motor  Motor Motor: Other (comment) Motor - Skilled Clinical Observations: generalized weakness  Mobility Bed Mobility Bed Mobility: Rolling Right;Rolling Left;Supine to Sit;Sit to Supine Rolling Right: Supervision/verbal cueing Rolling Left: Supervision/Verbal cueing Supine to Sit: Contact Guard/Touching assist Sit to Supine: Supervision/Verbal cueing Transfers Transfers: Sit to Stand;Stand to Sit;Stand  Pivot Transfers Sit to Stand: Minimal Assistance - Patient > 75% Stand to Sit: Contact Guard/Touching assist Stand Pivot Transfers: Contact Guard/Touching assist Transfer (Assistive device): Rolling walker Locomotion  Gait Ambulation: Yes Gait Assistance: Contact Guard/Touching assist Gait Distance (Feet): 70 Feet Assistive device: Rolling walker Gait Gait: Yes Gait Pattern: Impaired Gait Pattern: Step-through pattern;Decreased dorsiflexion - right;Decreased dorsiflexion - left;Trendelenburg;Decreased trunk rotation;Trunk flexed;Wide base of support Gait velocity: decreased Stairs / Additional Locomotion Stairs: Yes Stairs Assistance: Contact Guard/Touching assist Stair Management Technique: Two rails Number of Stairs: 4 Height of Stairs: 6 Ramp: Contact Guard/touching assist Curb: Minimal Assistance - Patient >75% Wheelchair Mobility Wheelchair Mobility: Yes Wheelchair Assistance: Chartered loss adjuster: Both upper extremities Wheelchair Parts Management: Needs assistance Distance: 123f   Trunk/Postural Assessment  Cervical Assessment Cervical Assessment: Exceptions to WFL(forward head ) Thoracic Assessment Thoracic Assessment: Exceptions to WFL(kyphotic ) Lumbar Assessment Lumbar Assessment: Exceptions to WFL(posterior pelvic tilt ) Postural Control Postural Control: Within Functional Limits  Balance Balance Balance Assessed:  Yes Static Sitting Balance Static Sitting - Balance Support: No upper extremity supported;Feet supported Static Sitting - Level of Assistance: 6: Modified independent (Device/Increase time) Dynamic Sitting Balance Dynamic Sitting - Balance Support: During functional activity;Feet supported Dynamic Sitting - Level of Assistance: 5: Stand by assistance Static Standing Balance Static Standing - Balance Support: Bilateral upper extremity supported;During functional activity Static Standing - Level of Assistance: 5: Stand by assistance Dynamic Standing Balance Dynamic Standing - Balance Support: Bilateral upper extremity supported;During functional activity Dynamic Standing - Level of Assistance: 4: Min assist Extremity Assessment  RUE Assessment RUE Assessment: Not tested LUE Assessment LUE Assessment: Not tested RLE Assessment RLE Assessment: Exceptions to W[facility] LLCGeneral Strength Comments: generalized weakness- ankle dorsiflexion 4/5, quad 4/5, seated hip ABD 2/5, seated hip flexion 3/5  LLE Assessment LLE Assessment: Exceptions to WApp[facility] Behavioral Health CareGeneral Strength Comments: generalized weakness- ankle dorsiflexion 4/5, quad 4/5, seated hip ABD 2/5, seated hip flexion 3/5     Refer to Care Plan for Long Term Goals  Recommendations for other services: Neuropsych  Discharge Criteria: Patient will be discharged from PT if patient refuses treatment 3 consecutive times without medical reason, if treatment goals not met, if there is a change in medical status, if patient makes no progress towards goals or if patient is discharged from hospital.  The above assessment, treatment plan, treatment alternatives and goals were discussed and mutually agreed upon: by patient  KDeniece ReePT, DPT, CBIS  Supplemental Physical Therapist [facility]   Pager [phone] Rehab Office [phone]

## 2018-10-13 NOTE — Progress Notes (Signed)
Physical Therapy Session Note  Patient Details  Name: Margaret [name] MRN: [id number] Date of Birth: Nov 03, 1940  Today's Date: 10/13/2018   Short Term Goals: Week 1:  PT Short Term Goal 1 (Week 1): STGs = LTGs due to ELOS   Skilled Therapeutic Interventions/Progress Updates:  Pt received supine in bed and reports that she is too nauseous for any bed level or OOB activity at this time; no education by this PT able to convince pt to participate. RN aware. Will re-attempt as pt appropriate.   Therapy Documentation Precautions:  Precautions Precautions: Fall Precaution Comments: low frustration tolerance  Restrictions Weight Bearing Restrictions: No General: Chart Reviewed: Yes PT Amount of Missed Time (min): 30 Minutes PT Missed Treatment Reason: Patient ill (Comment)(n/v) Family/Caregiver Present: No Pain: Pain Assessment Pain Scale: 0-10 Pain Score: 0-No pain Therapy/Group: Individual Therapy  Lorie Phenix 10/13/2018, 6:00 PM

## 2018-10-13 NOTE — Progress Notes (Signed)
Occupational Therapy Note  Patient Details  Name: Chauncey Bruno MRN: [id number] Date of Birth: 09-05-1940  Missed time: 60 min Pt continues to complain of nausea and fatigue. RN aware. Pt declines any tx at this time d/t feeling ill. Pt remains in bed, exit alarm on and call light in reach. Will follow up as available.   Lowella Dell Iysha Mishkin 10/13/2018, 12:38 PM

## 2018-10-13 NOTE — Progress Notes (Signed)
Occupational Therapy Note  Patient Details  Name: Margaret [name] MRN: [id number] Date of Birth: Nov 12, 1940  Attempted to see pt for make-up time. Pt in supine sleeping. Cont with complaints of nausea and declining getting to EOB or any other tx at this time. Will attempt to see pt again as pt able.   Felcia Huebert L 10/13/2018, 2:53 PM

## 2018-10-13 NOTE — Evaluation (Signed)
Occupational Therapy Assessment and Plan  Patient Details  Name: Margaret [name] MRN: [id number] Date of Birth: 1940/09/17  OT Diagnosis: abnormal posture, cognitive deficits and muscle weakness (generalized) Rehab Potential:   ELOS: 7-9   Today's Date: 10/13/2018 OT Individual Time: 2542-7062 OT Individual Time Calculation (min): 70 min     Problem List:  Patient Active Problem List   Diagnosis Date Noted  . Traumatic subdural hematoma ([place]) 10/12/2018  . Acute blood loss anemia   . Diabetes mellitus type 2 in nonobese (HCC)   . Malignant neoplasm of ovary ([place])   . PAF (paroxysmal atrial fibrillation) ([place])   . Chronic diastolic congestive heart failure ([place])   . Coronary artery disease involving native coronary artery of native heart without angina pectoris   . Subarachnoid hemorrhage ([place]) 10/08/2018  . Right ovarian cyst 08/12/2018  . Accelerated hypertension 07/31/2018  . Elevated lactic acid level 07/31/2018  . Hypertensive urgency 07/31/2018  . Goals of care, counseling/discussion 07/22/2018  . Primary high grade serous adenocarcinoma of ovary ([place]) 07/13/2018  . Controlled type 2 diabetes mellitus without complication, without long-term current use of insulin ([place]) 02/02/2018  . Bradycardia 01/13/2018  . Essential hypertension 11/04/2017  . Other hyperlipidemia 11/04/2017  . Hx of gastric bypass 11/04/2017  . NSTEMI (non-ST elevated myocardial infarction) ([place]) 06/14/2017  . History of ischemic right PCA stroke 03/27/2016  . Hemianopia, homonymous, left 01/31/2016  . Stroke ([place]) 11/06/2015  . Arthritis of knee, degenerative 05/04/2013  . Diastolic dysfunction 37/62/8315  . CAD S/P percutaneous coronary angioplasty   . Morbid obesity ([place])   . Right shoulder pain   . Anxiety and depression   . Dyspnea on exertion   . Cellulitis of right leg     Past Medical History:  Past Medical History:  Diagnosis Date  . Acetabular fracture ([place])   . Anxiety and depression    . Bartholin cyst   . CAD (coronary artery disease)   . Cancer ([place])    mouth cyst was cancer many years ago   . Cellulitis of right leg 2005  . CHF (congestive heart failure) ([place])   . Colon polyp   . Complication of anesthesia   . Diabetes mellitus without complication ([place])   . DJD (degenerative joint disease)   . Dyspnea on exertion   . Dysrhythmia    PAF  . GERD (gastroesophageal reflux disease)   . Hyperlipidemia   . Hypertension   . Morbid obesity ([place])   . Myocardial infarction ([place]) 06/14/2017  . Ovarian cancer ([place])   . PAF (paroxysmal atrial fibrillation) ([place])    a. diagnosed 7/19; b. CHADS2VASc 8; c. Eliquis  . PONV (postoperative nausea and vomiting)   . Presence of stent in right coronary artery 02/08/12   2.5x18 Xience distal RCA  . Right shoulder pain   . Stroke ([place]) 2017  . Vaginal prolapse    Past Surgical History:  Past Surgical History:  Procedure Laterality Date  . ABDOMINAL HYSTERECTOMY     supracervical abdominal w/removal tubes &/or ovaries  . BREAST BIOPSY  1989  . CHOLECYSTECTOMY    . COLONOSCOPY    . COLONOSCOPY WITH PROPOFOL N/A 06/21/2018   Procedure: COLONOSCOPY WITH PROPOFOL;  Surgeon: Lollie Sails, MD;  Location: [facility] ENDOSCOPY;  Service: Endoscopy;  Laterality: N/A;  . CORONARY STENT INTERVENTION N/A 06/14/2017   Procedure: CORONARY STENT INTERVENTION;  Surgeon: Wellington Hampshire, MD;  Location: [facility] CV LAB;  Service: Cardiovascular;  Laterality: N/A;  .  ESOPHAGOGASTRODUODENOSCOPY N/A 06/21/2018   Procedure: ESOPHAGOGASTRODUODENOSCOPY (EGD);  Surgeon: Lollie Sails, MD;  Location: [facility] END[facility]COPY;  Service: Endoscopy;  Laterality: N/A;  . FACIAL COSMETIC SURGERY     face/neck lift  . FRACTURE SURGERY    . GASTRIC BYPASS  2008  . JOINT REPLACEMENT    . KNEE SURGERY     left knee arthroscopic  . LAPAROSCOPIC BILATERAL SALPINGO OOPHERECTOMY Bilateral 06/24/2018   Procedure: LAPAROSCOPIC BILATERAL SALPINGO  OOPHORECTOMY;  Surgeon: Schermerhorn, Gwen Her, MD;  Location: [facility] ORS;  Service: Gynecology;  Laterality: Bilateral;  . LEFT HEART CATH AND CORONARY ANGIOGRAPHY N/A 06/14/2017   Procedure: LEFT HEART CATH AND CORONARY ANGIOGRAPHY;  Surgeon: Wellington Hampshire, MD;  Location: [facility] CV LAB;  Service: Cardiovascular;  Laterality: N/A;  . LEFT HEART CATH AND CORONARY ANGIOGRAPHY N/A 06/14/2017   Procedure: LEFT HEART CATH AND CORONARY ANGIOGRAPHY;  Surgeon: Wellington Hampshire, MD;  Location: [place] [facility] CV LAB;  Service: Cardiovascular;  Laterality: N/A;  . LYSIS OF ADHESION  06/24/2018   Procedure: LYSIS OF ADHESION;  Surgeon: Ouida Sills Gwen Her, MD;  Location: [facility] ORS;  Service: Gynecology;;  . Crisoforo [place]   left hip fracture/repair and left arm fracture  . OTHER SURGICAL HISTORY  1986   hysterectomy  . PORTA CATH INSERTION N/A 07/26/2018   Procedure: PORTA CATH INSERTION;  Surgeon: Katha Cabal, MD;  Location: [facility] CV LAB;  Service: Cardiovascular;  Laterality: N/A;  . right knee  late 90's   arthroscopic  . ROUX-EN-Y PROCEDURE    . SALIVARY GLAND SURGERY    . TOTAL HIP ARTHROPLASTY  2004   left    Assessment & Plan Clinical Impression: Margaret [name] is a 78 year old right-handed female with history of CAD with stenting maintained on Plavix, diastolic congestive heart failure, diet-controlled diabetes mellitus, PAF maintained on Eliquis followed by Dr. Martinique as well as ovarian cancer maintained on chemotherapy.  Patient lives with spouse.  One level home 4 steps to entry.  Ambulates with a rolling walker.  Needed assist with some household tasks from husband.  History taken from chart review and patient.  She presented on 10/08/2018 after a fall few days PTA with increased lethargy.  No nausea vomiting.  Cranial CT scan reviewed, showing small bilateral subarachnoid hemorrhages, right greater than left.  Chronic ischemic changes including a large right PCA infarction.   Eliquis and Plavix were held due to [facility].  Neurosurgery follow-up Dr. Vertell Limber recommended conservative care with follow-up head CT.  Follow-up head CT on 10/08/2018 reviewed, stable.  Recommendations per cardiology, to continue to hold anticoagulation.  Tolerating a regular diet.  Therapy evaluations completed with recommendations of physical medicine rehab consult.  Please see preadmission assessment from earlier today as well.    Patient currently requires min with basic self-care skills secondary to muscle weakness, decreased cardiorespiratoy endurance, decreased awareness, decreased problem solving, decreased safety awareness and delayed processing and decreased standing balance, decreased postural control and decreased balance strategies.  Prior to hospitalization, patient could complete BADL with modified independent .  Patient will benefit from skilled intervention to decrease level of assist with basic self-care skills and increase independence with basic self-care skills prior to discharge home with care partner.  Anticipate patient will require 24 hour supervision and follow up home health.  OT - End of Session Activity Tolerance: Tolerates 30+ min activity without fatigue Endurance Deficit: Yes OT Assessment Rehab Potential (ACUTE ONLY): Good OT Barriers to Discharge: Pending chemo/radiation OT  Patient demonstrates impairments in the following area(s): Balance;Cognition;Endurance;Motor;Safety OT Basic ADL's Functional Problem(s): Grooming;Bathing;Dressing;Toileting OT Transfers Functional Problem(s): Toilet;Tub/Shower OT Plan OT Intensity: Minimum of 1-2 x/day, 45 to 90 minutes OT Frequency: 5 out of 7 days OT Duration/Estimated Length of Stay: 7-9 OT Treatment/Interventions: Balance/vestibular training;Community reintegration;Disease mangement/prevention;Neuromuscular re-education;Self Care/advanced ADL retraining;Patient/family education;Splinting/orthotics;Therapeutic Exercise;UE/LE  Coordination activities;Wheelchair propulsion/positioning;Visual/perceptual remediation/compensation;UE/LE Strength taining/ROM;Therapeutic Activities;Skin care/wound managment;Psychosocial support;Functional mobility training;Pain management;DME/adaptive equipment instruction;Discharge planning;Cognitive remediation/compensation OT Self Feeding Anticipated Outcome(s): no goal OT Basic Self-Care Anticipated Outcome(s): S OT Toileting Anticipated Outcome(s): S OT Bathroom Transfers Anticipated Outcome(s): S OT Recommendation Patient destination: Home Follow Up Recommendations: Home health OT Equipment Recommended: Tub/shower seat   Skilled Therapeutic Intervention 1:1. Pt educated on role/purpose of OT, CIR, ELOS and POC. Pt with no pain but reporting nausea. Pt aggreeable to dress at EOB and toilet, but declines bathing at this time. RN delivers nausea medication. Pt completes UB dressing with set up and LB dressing with MOD A for second pair of pants d/t decreased problem solving with fatigue. Pt able to don B socks. Functional transfers/sit to stand at min A level with RW and VC for hand placement. OT selects BSC and pt able to void bladder on BSC and completes 2/3 steps of toileting with MIN A for balance. Exited session after selecting appropriate w/c cushion and pr seated in bed, exit alarm on and call light in reach  OT Evaluation Precautions/Restrictions  Precautions Precautions: Fall Restrictions Weight Bearing Restrictions: No General Chart Reviewed: Yes Family/Caregiver Present: No Vital Signs Therapy Vitals Temp: 98 F (36.7 C) Temp Source: Oral Pulse Rate: 78 Resp: 18 BP: 140/75 Patient Position (if appropriate): Lying Oxygen Therapy SpO2: 100 % O2 Device: Room Air Pain Pain Assessment Pain Score: 0-No pain Home Living/Prior Functioning Home Living Family/patient expects to be discharged to:: Private residence Living Arrangements: Spouse/significant  other Available Help at Discharge: Family, Available 24 hours/day Type of Home: House Home Access: Stairs to enter Technical brewer of Steps: 4 Entrance Stairs-Rails: Can reach both Home Layout: One level Bathroom Shower/Tub: Multimedia programmer: Programmer, systems: Yes  Lives With: Spouse IADL History Homemaking Responsibilities: No(husband assisting wiht IADLs) Current License: Yes Mode of Transportation: Car Type of Occupation: psychiatric nurse Prior Function Level of Independence: Independent with basic ADLs, Independent with homemaking with ambulation Comments: patient reports she has a rollator walkers that she uses for mobility ADL ADL Upper Body Dressing: Supervision/safety Lower Body Dressing: Moderate assistance, Moderate cueing Where Assessed-Lower Body Dressing: Edge of bed Toileting: Moderate assistance Where Assessed-Toileting: Bedside Commode Toilet Transfer: Minimal assistance Toilet Transfer Method: Stand pivot Toilet Transfer Equipment: Bedside commode Vision Baseline Vision/History: Wears glasses Wears Glasses: Reading only Patient Visual Report: No change from baseline Perception  Perception: Within Functional Limits Praxis Praxis: Intact Cognition Overall Cognitive Status: Impaired/Different from baseline Arousal/Alertness: Awake/alert Orientation Level: Person;Place;Situation Person: Oriented Place: Oriented Situation: Oriented Year: 2020 Month: April Day of Week: Correct Memory: Impaired Immediate Memory Recall: Sock;Blue;Bed Memory Recall: Bed;Sock;Blue Memory Recall Sock: Without Cue Memory Recall Blue: Without Cue Memory Recall Bed: Without Cue [place] [employer] Scales of Cognitive Functioning: Purposeful/appropriate Sensation Sensation Light Touch: Appears Intact Coordination Gross Motor Movements are Fluid and Coordinated: Yes Fine Motor Movements are Fluid and Coordinated: No Motor  Motor Motor:  Abnormal postural alignment and control Motor - Skilled Clinical Observations: generalized weakness Mobility  Transfers Sit to Stand: Minimal Assistance - Patient > 75% Stand to Sit: Minimal Assistance - Patient > 75%  Trunk/Postural Assessment  Cervical Assessment Cervical Assessment: Exceptions to WFL(head forward.) Thoracic Assessment Thoracic Assessment: Exceptions to WFL(rounded shoulders) Lumbar Assessment Lumbar Assessment: Exceptions to WFL(posterior pelvic preference)  Balance Balance Balance Assessed: Yes Dynamic Sitting Balance Dynamic Sitting - Balance Support: Feet supported Dynamic Sitting - Level of Assistance: 5: Stand by assistance Sitting balance - Comments: dressing Dynamic Standing Balance Dynamic Standing - Level of Assistance: 4: Min assist;3: Mod assist Dynamic Standing - Comments: dressing (MOD A wiht fatigue) Extremity/Trunk Assessment RUE Assessment RUE Assessment: Exceptions to [facility] General Strength Comments: generalized weakness LUE Assessment LUE Assessment: Exceptions to [facility] General Strength Comments: generalized weakness     Refer to Care Plan for Long Term Goals  Recommendations for other services: Therapeutic Recreation  Pet therapy and Stress management   Discharge Criteria: Patient will be discharged from OT if patient refuses treatment 3 consecutive times without medical reason, if treatment goals not met, if there is a change in medical status, if patient makes no progress towards goals or if patient is discharged from hospital.  The above assessment, treatment plan, treatment alternatives and goals were discussed and mutually agreed upon: by patient  Tonny Branch 10/13/2018, 7:18 AM

## 2018-10-14 ENCOUNTER — Inpatient Hospital Stay (HOSPITAL_COMMUNITY): Payer: PPO

## 2018-10-14 ENCOUNTER — Inpatient Hospital Stay (HOSPITAL_COMMUNITY): Payer: PPO | Admitting: Physical Therapy

## 2018-10-14 LAB — CBC
HCT: 28.7 % — ABNORMAL LOW (ref 36.0–46.0)
Hemoglobin: 9.5 g/dL — ABNORMAL LOW (ref 12.0–15.0)
MCH: 30.7 pg (ref 26.0–34.0)
MCHC: 33.1 g/dL (ref 30.0–36.0)
MCV: 92.9 fL (ref 80.0–100.0)
Platelets: 168 10*3/uL (ref 150–400)
RBC: 3.09 MIL/uL — ABNORMAL LOW (ref 3.87–5.11)
RDW: 16.4 % — ABNORMAL HIGH (ref 11.5–15.5)
WBC: 10.9 10*3/uL — ABNORMAL HIGH (ref 4.0–10.5)
nRBC: 0 % (ref 0.0–0.2)

## 2018-10-14 NOTE — Progress Notes (Signed)
[facility] Individual Statement of Services  Patient Name:  Margaret [name]  Date:  10/14/2018  Welcome to the [facility].  Our goal is to provide you with an individualized program based on your diagnosis and situation, designed to meet your specific needs.  With this comprehensive rehabilitation program, you will be expected to participate in at least 3 hours of rehabilitation therapies Monday-Friday, with modified therapy programming on the weekends.  Your rehabilitation program will include the following services:  Physical Therapy (PT), Occupational Therapy (OT), 24 hour per day rehabilitation nursing, Neuropsychology, Case Management (Social Worker), Rehabilitation Medicine, Nutrition Services and Pharmacy Services  Weekly team conferences will be held on Tuesdays to discuss your progress.  Your Social Worker will talk with you frequently to get your input and to update you on team discussions.  Team conferences with you and your family in attendance may also be held.  Expected length of stay: 7 to 10 days  Overall anticipated outcome: Supervision  Depending on your progress and recovery, your program may change. Your Social Worker will coordinate services and will keep you informed of any changes. Your Social Worker's name and contact numbers are listed  below.  The following services may also be recommended but are not provided by the [facility] will be made to provide these services after discharge if needed.  Arrangements include referral to agencies that provide these services.  Your insurance has been verified to be:  [employer] Your primary doctor is:  Dr. Harrel Lemon  Pertinent information will be shared with your doctor and your insurance company.  Social Worker:  Alfonse Alpers, LCSW  [phone] or ([phone]  Information discussed with and copy given to patient by: Trey Sailors, 10/14/2018, 2:54 PM

## 2018-10-14 NOTE — Progress Notes (Signed)
Social Work Assessment and Plan  Patient Details  Name: Margaret [name] MRN: [id number] Date of Birth: 1940/09/09  Today's Date: 10/13/2018  Problem List:  Patient Active Problem List   Diagnosis Date Noted  . Traumatic subdural hematoma ([place]) 10/12/2018  . Acute blood loss anemia   . Diabetes mellitus type 2 in nonobese (HCC)   . Malignant neoplasm of ovary ([place])   . PAF (paroxysmal atrial fibrillation) ([place])   . Chronic diastolic congestive heart failure ([place])   . Coronary artery disease involving native coronary artery of native heart without angina pectoris   . Subarachnoid hemorrhage ([place]) 10/08/2018  . Right ovarian cyst 08/12/2018  . Accelerated hypertension 07/31/2018  . Elevated lactic acid level 07/31/2018  . Hypertensive urgency 07/31/2018  . Goals of care, counseling/discussion 07/22/2018  . Primary high grade serous adenocarcinoma of ovary ([place]) 07/13/2018  . Controlled type 2 diabetes mellitus without complication, without long-term current use of insulin ([place]) 02/02/2018  . Bradycardia 01/13/2018  . Essential hypertension 11/04/2017  . Other hyperlipidemia 11/04/2017  . Hx of gastric bypass 11/04/2017  . NSTEMI (non-ST elevated myocardial infarction) ([place]) 06/14/2017  . History of ischemic right PCA stroke 03/27/2016  . Hemianopia, homonymous, left 01/31/2016  . Stroke ([place]) 11/06/2015  . Arthritis of knee, degenerative 05/04/2013  . Diastolic dysfunction 08/65/7846  . CAD S/P percutaneous coronary angioplasty   . Morbid obesity ([place])   . Right shoulder pain   . Anxiety and depression   . Dyspnea on exertion   . Cellulitis of right leg    Past Medical History:  Past Medical History:  Diagnosis Date  . Acetabular fracture ([place])   . Anxiety and depression   . Bartholin cyst   . CAD (coronary artery disease)   . Cancer ([place])    mouth cyst was cancer many years ago   . Cellulitis of right leg 2005  . CHF (congestive heart failure) ([place])   . Colon polyp    . Complication of anesthesia   . Diabetes mellitus without complication ([place])   . DJD (degenerative joint disease)   . Dyspnea on exertion   . Dysrhythmia    PAF  . GERD (gastroesophageal reflux disease)   . Hyperlipidemia   . Hypertension   . Morbid obesity ([place])   . Myocardial infarction ([place]) 06/14/2017  . Ovarian cancer ([place])   . PAF (paroxysmal atrial fibrillation) ([place])    a. diagnosed 7/19; b. CHADS2VASc 8; c. Eliquis  . PONV (postoperative nausea and vomiting)   . Presence of stent in right coronary artery 02/08/12   2.5x18 Xience distal RCA  . Right shoulder pain   . Stroke ([place]) 2017  . Vaginal prolapse    Past Surgical History:  Past Surgical History:  Procedure Laterality Date  . ABDOMINAL HYSTERECTOMY     supracervical abdominal w/removal tubes &/or ovaries  . BREAST BIOPSY  1989  . CHOLECYSTECTOMY    . COLONOSCOPY    . COLONOSCOPY WITH PROPOFOL N/A 06/21/2018   Procedure: COLONOSCOPY WITH PROPOFOL;  Surgeon: Lollie Sails, MD;  Location[facility] ENDOSCOPY;  Service: Endoscopy;  Laterality: N/A;  . CORONARY STENT INTERVENTION N/A 06/14/2017   Procedure: CORONARY STENT INTERVENTION;  Surgeon: Wellington Hampshire, MD;  Location: [place] CV LAB;  Service: Cardiovascular;  Laterality: N/A;  . ESOPHAGOGASTRODUODENOSCOPY N/A 06/21/2018   Procedure: ESOPHAGOGASTRODUODENOSCOPY (EGD);  Surgeon: Lollie Sails, MD;  Location[facility] ENDOSCOPY;  Service: Endoscopy;  Laterality: N/A;  . FACIAL COSMETIC SURGERY  face/neck lift  . FRACTURE SURGERY    . GASTRIC BYPASS  2008  . JOINT REPLACEMENT    . KNEE SURGERY     left knee arthroscopic  . LAPAROSCOPIC BILATERAL SALPINGO OOPHERECTOMY Bilateral 06/24/2018   Procedure: LAPAROSCOPIC BILATERAL SALPINGO OOPHORECTOMY;  Surgeon: Schermerhorn, Gwen Her, MD;  Location: [facility] ORS;  Service: Gynecology;  Laterality: Bilateral;  . LEFT HEART CATH AND CORONARY ANGIOGRAPHY N/A 06/14/2017   Procedure: LEFT HEART CATH AND  CORONARY ANGIOGRAPHY;  Surgeon: Wellington Hampshire, MD;  Location: [place] CV LAB;  Service: Cardiovascular;  Laterality: N/A;  . LEFT HEART CATH AND CORONARY ANGIOGRAPHY N/A 06/14/2017   Procedure: LEFT HEART CATH AND CORONARY ANGIOGRAPHY;  Surgeon: Wellington Hampshire, MD;  Location: [place] CV LAB;  Service: Cardiovascular;  Laterality: N/A;  . LYSIS OF ADHESION  06/24/2018   Procedure: LYSIS OF ADHESION;  Surgeon: Ouida Sills Gwen Her, MD;  Location: [facility] ORS;  Service: Gynecology;;  . Crisoforo [place]   left hip fracture/repair and left arm fracture  . OTHER SURGICAL HISTORY  1986   hysterectomy  . PORTA CATH INSERTION N/A 07/26/2018   Procedure: PORTA CATH INSERTION;  Surgeon: Katha Cabal, MD;  Location: [place] CV LAB;  Service: Cardiovascular;  Laterality: N/A;  . right knee  late 90's   arthroscopic  . ROUX-EN-Y PROCEDURE    . SALIVARY GLAND SURGERY    . TOTAL HIP ARTHROPLASTY  2004   left   Social History:  reports that she quit smoking about 34 years ago. Her smoking use included cigarettes. She has a 52.00 pack-year smoking history. She has never used smokeless tobacco. She reports current alcohol use. She reports that she does not use drugs.  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   Abuse/Neglect Abuse/Neglect Assessment Can Be Completed: Yes Physical Abuse: Denies Verbal Abuse: Denies Sexual Abuse: Denies Exploitation of patient/patient's resources: Denies Self-Neglect: Denies  Emotional Status Pt's affect, behavior and adjustment status: Pt is having a lot of fatigue, but is motivated to complete rehab program.  She will do what she needs to do to go home. Recent Psychosocial Issues: Pt admits to some situational anxiety and depression as related to her medical conditions. Psychiatric History: none reported, but anxiety and depression listed in her chart Substance Abuse History: none reported  Patient / Family Perceptions, Expectations & Goals Pt/Family understanding of illness & functional limitations: Pt has a good understanding of her illness/condition.  Husband is trying to learn about her condition, but it is hard for him because he's not been able to see her. Premorbid pt/family roles/activities: Pt likes to read and is a book club. Anticipated changes in roles/activities/participation: Pt would like to resume activities as she is able. Pt/family expectations/goals: Pt wants to be able to take care of herself and to increase her strength and endurance.  Community [employer] Agencies: None Premorbid Home Care/DME Agencies: Other (Comment)(Pt has a walker  and a bedside commode.) Transportation available at discharge: husband Resource referrals recommended: Neuropsychology, Support group (specify)(cancer support groups)  Discharge Planning Living Arrangements: Spouse/significant other Support Systems: Spouse/significant other, Children Type of Residence: Private residence Financial Resources: Social Security Financial Screen Referred: No Money Management: Patient, Spouse Does the patient have any problems obtaining your medications?: No Home Management: Pt and husband share home management.  [name] is 35 years  old. Patient/Family Preliminary Plans: Pt plans to go home to her home where her husband will be with her to provide supervision. Social Work Anticipated Follow Up Needs: HH/OP, Engineer, manufacturing support groups/services) Expected length of stay: 7 to 10 days  Clinical Impression CSW met with pt to introduce self and role of CSW, as well as to complete assessment.  Pt was very tired and stated she didn't feel well, but she talked with CSW and is wanting to work through these things so that she can get better and get home.  She stated her husband will be home with her and her dtr will help when she is not at work.  CSW talked with husband today and he confirmed that he will be with her at home, but he is 80 years old and couldn't help pt physically.  He can provide supervision.  Pt had not concerns/questions/needs at the time of visit, but CSW will continue to follow and assist as needed.  Bettylou Frew, Silvestre Mesi 10/14/2018, 9:41 PM

## 2018-10-14 NOTE — Progress Notes (Signed)
Occupational Therapy Session Note  Patient Details  Name: Keyana Guevara MRN: [id number] Date of Birth: 08-Nov-1940  Today's Date: 10/14/2018 OT Individual Time: 0745-0900 OT Individual Time Calculation (min): 75 min    Short Term Goals: Week 1:  OT Short Term Goal 1 (Week 1): n/a d/t ELOS  Skilled Therapeutic Interventions/Progress Updates:     1;1. Pt with no c/o pian and received in bed. Improvement in nausea this date. Pt completes stand pivot transfer with no AD and MIN A to w/c. Pt washes up UB at sink seated with VC for soap use. Pt dons shirt with S overall. Pt bathes LB sit to stand for peri area and buttock washing. Pt completes toileting prior to buttock washing seated on BSC at sink. Pt able to void bladder on BSC and cleanse with S. Clothing management with MIN A for balance. Pt dresses sit to stand from [facility] at sink. Pt able to thread BLE into underwear, however unable to problem solbve placing pad into underwear. Pt in and out of sleep despite multiple cues to arouse. MOD A stand pivot back to bed with OT advancing underwear past hips. Pt sat upright with hospital bed features and more alert. Pt inserts teeth and encouraged to self feed d/t improved arousal. Pt will likely need 15/7 schedule d/t low activity tolerance. Exited session with pt saeted in bed, exit alarm on and call lightin reach  Therapy Documentation Precautions:  Precautions Precautions: Fall Precaution Comments: low frustration tolerance  Restrictions Weight Bearing Restrictions: No General:   Vital Signs: Therapy Vitals Temp: 97.6 F (36.4 C) Pulse Rate: 70 Resp: 18 BP: 123/71 Patient Position (if appropriate): Lying Oxygen Therapy SpO2: 100 % O2 Device: Room Air Pain:   ADL: ADL Upper Body Dressing: Supervision/safety Lower Body Dressing: Moderate assistance, Moderate cueing Where Assessed-Lower Body Dressing: Edge of bed Toileting: Moderate assistance Where Assessed-Toileting: Bedside  Commode Toilet Transfer: Minimal assistance Toilet Transfer Method: Stand pivot Toilet Transfer Equipment: Bedside commode Vision   Perception    Praxis   Exercises:   Other Treatments:     Therapy/Group: Individual Therapy  Tonny Branch 10/14/2018, 8:03 AM

## 2018-10-14 NOTE — Progress Notes (Signed)
Physical Therapy Session Note  Patient Details  Name: Margaret [name] MRN: [id number] Date of Birth: 06-06-1941  Today's Date: 10/14/2018 PT Individual Time: 1134-1205 PT Individual Time Calculation (min): 31 min   Short Term Goals: Week 1:  PT Short Term Goal 1 (Week 1): STGs = LTGs due to ELOS   Skilled Therapeutic Interventions/Progress Updates:  Pt received in bed & agreeable to tx. Pt requires significant time to initiate sit>supine but eventually does so with supervision & hospital bed features. Pt transfers sit<>stand with supervision<>CGA and stand pivot to w/c with RW & CGA. Pt frequently groaning during session, reporting pain & fatigue. Pt reports 10/10 fatigue at beginning of session. Therapist and pt discussed goals and mobility level needed to d/c home with husband (negotiate stairs, walk length of house). Pt ambulates 30 ft with RW & CGA with decreased gait speed, forward trunk lean on RW. Pt reports need to use bathroom and ambulates in/out of bathroom with RW & CGA, performs clothing management with supervision for standing balance without BUE support, and toilet transfer with supervision. Pt with continent void on toilet performing peri hygiene independently. Pt performs hand hygiene standing at sink with CGA. Pt returns to bed with poor eccentric control with stand>sit and supervision for sit>supine. Pt left in bed with alarm set & needs at hand.  Therapy Documentation Precautions:  Precautions Precautions: Fall Precaution Comments: low frustration tolerance  Restrictions Weight Bearing Restrictions: No  General: PT Amount of Missed Time (min): 29 Minutes PT Missed Treatment Reason: Patient fatigue  Pain: 5/10 back - meds requested & administered by RN during session   Therapy/Group: Individual Therapy  Waunita Schooner 10/14/2018, 12:26 PM

## 2018-10-14 NOTE — Progress Notes (Signed)
Patient slept well throughout the night. No complaints of nausea or pain this shift.

## 2018-10-14 NOTE — Progress Notes (Addendum)
[place] PHYSICAL MEDICINE & REHABILITATION PROGRESS NOTE   Subjective/Complaints: No problems overnight. Working with OT. Appears fatigued. Has been through about 30-40 minutes with OT so far  ROS: Patient denies fever, rash, sore throat, blurred vision, nausea, vomiting, diarrhea, cough, shortness of breath or chest pain, joint or back pain, headache, or mood change. .   Objective:   No results found. Recent Labs    10/13/18 0529 10/14/18 0515  WBC 11.5* 10.9*  HGB 9.5* 9.5*  HCT 28.3* 28.7*  PLT 136* 168   Recent Labs    10/13/18 0529  NA 139  K 3.6  CL 109  CO2 20*  GLUCOSE 94  BUN 6*  CREATININE 0.80  CALCIUM 9.3    Intake/Output Summary (Last 24 hours) at 10/14/2018 7262 Last data filed at 10/13/2018 2003 Gross per 24 hour  Intake 300 ml  Output -  Net 300 ml     Physical Exam: Vital Signs Blood pressure 123/71, pulse 70, temperature 97.6 F (36.4 C), resp. rate 18, height 5\' 8"  (1.727 m), weight 68.7 kg, SpO2 100 %. Constitutional: No distress . Vital signs reviewed. Appears fatigued HEENT: EOMI, oral membranes moist Neck: supple Cardiovascular: RRR without murmur. No JVD    Respiratory: CTA Bilaterally without wheezes or rales. Normal effort    GI: BS +, non-tender, non-distended  Musculoskeletal:     Comments: No edema or tenderness in extremities  Neurological: fatigued,  oriented to person, place, and time.   Makes good eye contact with examiner.  Seemed to be struggling a bit more with word finding today.  Motor: Grossly 4+/5 throughout  Skin: Skin is warm and dry.  Psychiatric: flat     Assessment/Plan: 1. Functional deficits secondary to traumatic SAH which require 3+ hours per day of interdisciplinary therapy in a comprehensive inpatient rehab setting.  Physiatrist is providing close team supervision and 24 hour management of active medical problems listed below.  Physiatrist and rehab team continue to assess barriers to  discharge/monitor patient progress toward functional and medical goals  Care Tool:  Bathing  Bathing activity did not occur: Refused           Bathing assist       Upper Body Dressing/Undressing Upper body dressing   What is the patient wearing?: Pull over shirt    Upper body assist Assist Level: Supervision/Verbal cueing    Lower Body Dressing/Undressing Lower body dressing      What is the patient wearing?: Pants     Lower body assist Assist for lower body dressing: Moderate Assistance - Patient 50 - 74%     Toileting Toileting    Toileting assist Assist for toileting: Minimal Assistance - Patient > 75%     Transfers Chair/bed transfer  Transfers assist     Chair/bed transfer assist level: Contact Guard/Touching assist     Locomotion Ambulation   Ambulation assist      Assist level: Contact Guard/Touching assist Assistive device: Walker-rolling Max distance: 58ft    Walk 10 feet activity   Assist     Assist level: Contact Guard/Touching assist Assistive device: Walker-rolling   Walk 50 feet activity   Assist    Assist level: Contact Guard/Touching assist Assistive device: Walker-rolling    Walk 150 feet activity   Assist Walk 150 feet activity did not occur: Safety/medical concerns(fatigue )         Walk 10 feet on uneven surface  activity   Assist     Assist level:  Minimal Assistance - Patient > 75% Assistive device: Aeronautical engineer Will patient use wheelchair at discharge?: No      Wheelchair assist level: Supervision/Verbal cueing Max wheelchair distance: 126ft     Wheelchair 50 feet with 2 turns activity    Assist        Assist Level: Supervision/Verbal cueing   Wheelchair 150 feet activity     Assist     Assist Level: Supervision/Verbal cueing    Medical Problem List and Plan: 1.  Decreased functional mobility secondary to traumatic SAH left sylvian fissure,  left temporal, right frontal             -Continue CIR therapies including PT, OT, and SLP    -may need to reduce therapy intensity---observe today 2.  Antithrombotics: -DVT/anticoagulation: SCDs             -antiplatelet therapy: Chronic Plavix and Eliquis held due to [facility] 3. Pain Management: Hydrocodone and Flexeril as needed 4. Mood: Zoloft 100 mg daily             -antipsychotic agents: N/A 5. Neuropsych: This patient is capable of making decisions on her own behalf. 6. Skin/Wound Care: Routine skin checks 7. Fluids/Electrolytes/Nutrition:  -encourage po  -protein supp for low albumin 8.  Hypertension.  Lopressor 25 mg twice daily, Cozaar 100 mg daily, Norvasc 2.5 mg daily.  Monitor with increased mobility. 9.  CAD with stenting.  Plavix currently on hold. 10.  Diastolic congestive heart failure.     -check daily weights Filed Weights   10/12/18 1300  Weight: 68.7 kg   . 11.  PAF.  Cardiac rate controlled.  Eliquis on hold.  Monitor heart rate with increased physical activity. 12.  Ovarian cancer.  Follow-up outpatient.chemotherapy currently on hold until rehabilitation completed for [facility] 13.  Diet-controlled diabetes mellitus.  Patient currently on a regular diet.    14.  Hyperlipidemia.  Zetia/Lipitor 15.  Iron deficiency anemia? Normocytic however. Likely chronic disease element    Continue iron supplement.   -check iron panel Monday    -stools reported black  - hgb drifting down from 13.3 on 2/17 to 9.5 currently.   -today's hgb unchanged from yesterday's result  -stool heme negative  -no signs of gross blood loss  16. Nausea: may be anxiety related, ?meds  -resolved    LOS: 2 days A FACE TO FACE EVALUATION WAS PERFORMED  Meredith Staggers 10/14/2018, 8:52 AM

## 2018-10-14 NOTE — Progress Notes (Signed)
Occupational Therapy Session Note  Patient Details  Name: Margaret [name] MRN: [id number] Date of Birth: 1941/02/17  Today's Date: 10/14/2018 OT Individual Time: 6219-4712 OT Individual Time Calculation (min): 45 min    Short Term Goals: Week 1:  OT Short Term Goal 1 (Week 1): n/a d/t ELOS  Skilled Therapeutic Interventions/Progress Updates:    Pt received supine with no c/o pain, very fatigued but willing to participate in OT. Pt completed bed mobility and transitioned to EOB with min A. Pt able to don socks with cueing for propping feet up to decrease distal reaching. Pt VERY fatigued following this and required supine rest break. Pt transitioned back to EOB and donned pants with CGA. Pt completed functional mobility into bathroom ,increased time, but no more than CGA. Pt completed clothing management in standing with CGA for balance support. Pt voided urine and required min A to stand from toilet. Pt sat in w/c and completed oral hygiene at sink with set up. Pt brought down to dayroom where she completed brief BUE strengthening circuit with level 1 theraband. Pt returned to room and left supine with all needs met, bed alarm set.   Therapy Documentation Precautions:  Precautions Precautions: Fall Precaution Comments: low frustration tolerance  Restrictions Weight Bearing Restrictions: No   Therapy/Group: Individual Therapy  Curtis Sites 10/14/2018, 12:07 PM

## 2018-10-14 NOTE — IPOC Note (Signed)
Overall Plan of Care [facility]) Patient Details Name: Margaret [name] MRN: [id number] DOB: May 04, 1941  Admitting Diagnosis: <principal problem not specified>  Hospital Problems: Active Problems:   Traumatic subdural hematoma ([place])     Functional Problem List: Nursing Endurance, Motor, Nutrition, Safety  PT Balance, Safety, Endurance, Motor  OT Balance, Cognition, Endurance, Motor, Safety  SLP    TR         Basic ADL's: OT Grooming, Bathing, Dressing, Toileting     Advanced  ADL's: OT       Transfers: PT Bed Mobility, Bed to Chair, Car, [employer], Futures trader, Metallurgist: PT Ambulation, Emergency planning/management officer, Stairs     Additional Impairments: OT    SLP        TR      Anticipated Outcomes Item Anticipated Outcome  Self Feeding no goal  Swallowing      Basic self-care  S  Toileting  S   Bathroom Transfers S  Bowel/Bladder  Pt will manage bowel and bladder with min assist at discharge  Transfers  S, LRAD   Locomotion  S, LRAD   Communication     Cognition     Pain  Pt will manage pain at 2 or less on a scale of 0-10.   Safety/Judgment  Pt will remain free of falls and injury while in rehab with min assist    Therapy Plan: PT Intensity: Minimum of 1-2 x/day ,45 to 90 minutes PT Frequency: 5 out of 7 days PT Duration Estimated Length of Stay: 7-10 days  OT Intensity: Minimum of 1-2 x/day, 45 to 90 minutes OT Frequency: 5 out of 7 days OT Duration/Estimated Length of Stay: 7-9     Due to the current state of emergency, patients may not be receiving their 3-hours of Medicare-mandated therapy.   Team Interventions: Nursing Interventions Patient/Family Education, Bladder Management, Medication Management, Pain Management, Discharge Planning  PT interventions Ambulation/gait training, Cognitive remediation/compensation, Discharge planning, DME/adaptive equipment instruction, Functional mobility training, Pain management, Psychosocial  support, Splinting/orthotics, Therapeutic Activities, UE/LE Strength taining/ROM, Visual/perceptual remediation/compensation, Training and development officer, Community reintegration, Disease management/prevention, Functional electrical stimulation, Neuromuscular re-education, Patient/family education, Skin care/wound management, Stair training, Therapeutic Exercise, UE/LE Coordination activities, Wheelchair propulsion/positioning  OT Interventions Training and development officer, Community reintegration, Disease mangement/prevention, Neuromuscular re-education, Self Care/advanced ADL retraining, Patient/family education, Splinting/orthotics, Therapeutic Exercise, UE/LE Coordination activities, Wheelchair propulsion/positioning, Visual/perceptual remediation/compensation, UE/LE Strength taining/ROM, Therapeutic Activities, Skin care/wound managment, Psychosocial support, Functional mobility training, Pain management, DME/adaptive equipment instruction, Discharge planning, Cognitive remediation/compensation  SLP Interventions    TR Interventions    SW/CM Interventions Discharge Planning, Psychosocial Support, Patient/Family Education   Barriers to Discharge MD  Medical stability  Nursing      PT      OT Pending chemo/radiation    SLP      SW       Team Discharge Planning: Destination: PT-Home ,OT- Home , SLP-  Projected Follow-up: PT-Home health PT, 24 hour supervision/assistance, OT-  Home health OT, SLP-  Projected Equipment Needs: PT-Rolling walker with 5" wheels, OT- Tub/shower seat, SLP-  Equipment Details: PT- , OT-  Patient/family involved in discharge planning: PT- Patient,  OT-Patient, SLP-   MD ELOS: 7-10 days Medical Rehab Prognosis:  Excellent Assessment: The patient has been admitted for CIR therapies with the diagnosis of traumatic SAH's. The team will be addressing functional mobility, strength, stamina, balance, safety, adaptive techniques and equipment, self-care, bowel and  bladder mgt, patient and caregiver education, NMR, cognition,  activity tolerance, community reentry . Goals have been set at supervision for self-care, mobility .   Due to the current state of emergency, patients may not be receiving their 3 hours per day of Medicare-mandated therapy.    Meredith Staggers, MD, FAAPMR      See Team Conference Notes for weekly updates to the plan of care

## 2018-10-15 ENCOUNTER — Inpatient Hospital Stay (HOSPITAL_COMMUNITY): Payer: PPO | Admitting: Physical Therapy

## 2018-10-15 ENCOUNTER — Inpatient Hospital Stay (HOSPITAL_COMMUNITY): Payer: PPO

## 2018-10-15 DIAGNOSIS — R269 Unspecified abnormalities of gait and mobility: Secondary | ICD-10-CM

## 2018-10-15 DIAGNOSIS — I69398 Other sequelae of cerebral infarction: Secondary | ICD-10-CM

## 2018-10-15 DIAGNOSIS — I609 Nontraumatic subarachnoid hemorrhage, unspecified: Secondary | ICD-10-CM

## 2018-10-15 DIAGNOSIS — I952 Hypotension due to drugs: Secondary | ICD-10-CM

## 2018-10-15 NOTE — Progress Notes (Signed)
Orthopedic Tech Progress Note Patient Details:  Margaret [name] March 11, 1941 [id number] RN called requesting ABDOMINAL BINDER. Applied it while patient was laying down. I told her once she got up she may have to make it tighter to her liking Ortho Devices Type of Ortho Device: Abdominal binder Ortho Device/Splint Interventions: Adjustment, Application, Ordered   Post Interventions Patient Tolerated: Well Instructions Provided: Care of device, Adjustment of device   Janit Pagan 10/15/2018, 3:30 PM

## 2018-10-15 NOTE — Progress Notes (Signed)
Occupational Therapy Session Note  Patient Details  Name: Margaret [name] MRN: [id number] Date of Birth: 09/05/1940  Today's Date: 10/15/2018 OT Individual Time: 1530-1545 OT Individual Time Calculation (min): 15 min    Short Term Goals: Week 1:  OT Short Term Goal 1 (Week 1): n/a d/t ELOS  Skilled Therapeutic Interventions/Progress Updates:    1:1. Pt received in bed lethargic. Pt declines eating meal despite education. Pt states, "nothing tastes good." Pt supine BP assessed d/t orthostatic event earlier with PT 131/75. Pt supine to sit with CGA and BP reassessed 96/70 pt moaning and states, "something isnt right, I cant do this." OT educates on use of compression socks to manage BP, however pt declines any further intervention at bed level. Attempted to engage pt in alternative pain/stress management strategies such as guided meditation and aroma therapy, however pt declines stating, "I already feel like Im in a made up world." PT missed 60 min skilled OT d/t fatigue/orthostasis  Therapy Documentation Precautions:  Precautions Precautions: Fall Precaution Comments: low frustration tolerance  Restrictions Weight Bearing Restrictions: No General: General OT Amount of Missed Time: 60 Minutes PT Missed Treatment Reason: Other (Comment);Patient ill (Comment)(medical issues during tx session- see note for details ) Vital Signs:   Pain: Pain Assessment Pain Scale: 0-10 Pain Score: 0-No pain ADL: ADL Upper Body Dressing: Supervision/safety Lower Body Dressing: Moderate assistance, Moderate cueing Where Assessed-Lower Body Dressing: Edge of bed Toileting: Moderate assistance Where Assessed-Toileting: Bedside Commode Toilet Transfer: Minimal assistance Toilet Transfer Method: Stand pivot Toilet Transfer Equipment: Bedside commode Vision   Perception    Praxis   Exercises:   Other Treatments:     Therapy/Group: Individual Therapy  Tonny Branch 10/15/2018, 1:48  PM

## 2018-10-15 NOTE — Progress Notes (Signed)
Per therapist patient had an episode during therapy today of confusion , unable to speak bp 84/56 when placed back in bed bp 112/65 patient able to talk and more with it. RN went to check on patient and was aware of what happened; patient has a poor appetite as well. MD made aware of incident.

## 2018-10-15 NOTE — Progress Notes (Signed)
Physical Therapy Session Note  Patient Details  Name: Margaret [name] MRN: [id number] Date of Birth: Nov 09, 1940  Today's Date: 10/15/2018 PT Individual Time: 8016-5537 PT Individual Time Calculation (min): 35 min   Short Term Goals: Week 1:  PT Short Term Goal 1 (Week 1): STGs = LTGs due to ELOS   Skilled Therapeutic Interventions/Progress Updates:    Patient received in bed, pleasant and willing to participate in PT session today. Able to complete supine to sit with S, transfer to WC with RW and min guard; shoes donned totalA for time management, upon donning shoes dead roach fell out of one shoe, patient calmly states, "oh you found a bug?". Transported to hallway outside of PT gym with totalA in [facility], then attempted gait however at this point patient began to appear progressively confused and lethargic, oriented x3 (baseline at her eval) but could not hold conversation with PT or follow commands, kept whispering "what do you want me to do?". Smile, puffing cheeks, sticking out tongue all appear symmetrical as best as patient able to follow commands to perform.   Vitals taken in [facility] and found to be as follows: BP 84/56, HR 72, SpO2 99% on room air.  Took patient back to her room in [facility] and she required [place] for sit to stand/stand pivot transfer and sit to supine for back to bed. Vitals taken again in supine, found to have BP 112/65 and HR 72. Patient progressively improved cognitively and able to have conversation/follow cues once returned to supine but remains lethargic. She was left in bed with alarm active and RN and other attending therapist educated regarding this event and patient status. 25 minutes of skilled therapy time missed due to medical event during this session.   Therapy Documentation Precautions:  Precautions Precautions: Fall Precaution Comments: low frustration tolerance  Restrictions Weight Bearing Restrictions: No General: PT Amount of Missed Time (min): 25 Minutes PT  Missed Treatment Reason: Other (Comment);Patient ill (Comment)(medical issues during tx session- see note for details ) Vital Signs:   Pain: Pain Assessment Pain Scale: 0-10 Pain Score: 0-No pain    Therapy/Group: Individual Therapy  Deniece Ree PT, DPT, CBIS  Supplemental Physical Therapist [facility]    Pager [phone] Acute Rehab Office [phone]   10/15/2018, 10:02 AM

## 2018-10-15 NOTE — Progress Notes (Signed)
[place] PHYSICAL MEDICINE & REHABILITATION PROGRESS NOTE   Subjective/Complaints: Blood pressure down in the 80s with physical therapy this morning.  Patient had reduced verbal output at that time and when put back to bed blood pressure climbed into the 100s with improvement in her verbalizations.  ROS: Patient denies pain, shortness of breath, nausea vomiting diarrhea Objective:   No results found. Recent Labs    10/13/18 0529 10/14/18 0515  WBC 11.5* 10.9*  HGB 9.5* 9.5*  HCT 28.3* 28.7*  PLT 136* 168   Recent Labs    10/13/18 0529  NA 139  K 3.6  CL 109  CO2 20*  GLUCOSE 94  BUN 6*  CREATININE 0.80  CALCIUM 9.3    Intake/Output Summary (Last 24 hours) at 10/15/2018 1230 Last data filed at 10/15/2018 0810 Gross per 24 hour  Intake 120 ml  Output -  Net 120 ml     Physical Exam: Vital Signs Blood pressure 123/69, pulse 76, temperature 98 F (36.7 C), temperature source Oral, resp. rate 18, height 5' 8"  (1.727 m), weight 66.9 kg, SpO2 99 %. Constitutional: No distress . Vital signs reviewed. Appears fatigued HEENT: EOMI, oral membranes moist Neck: supple Cardiovascular: RRR without murmur. No JVD    Respiratory: CTA Bilaterally without wheezes or rales. Normal effort    GI: BS +, non-tender, non-distended  Musculoskeletal:     Comments: No edema or tenderness in extremities  Neurological: fatigued,  oriented to person, place, and time.   Makes good eye contact with examiner.    Single word or short phrase level communication, follows commands Motor: Grossly 4+/5 throughout  Skin: Skin is warm and dry.  Psychiatric: flat     Assessment/Plan: 1. Functional deficits secondary to traumatic SAH which require 3+ hours per day of interdisciplinary therapy in a comprehensive inpatient rehab setting.  Physiatrist is providing close team supervision and 24 hour management of active medical problems listed below.  Physiatrist and rehab team continue to assess  barriers to discharge/monitor patient progress toward functional and medical goals  Care Tool:  Bathing  Bathing activity did not occur: Refused Body parts bathed by patient: Right arm, Left arm, Chest, Abdomen, Front perineal area, Buttocks, Right upper leg, Left upper leg, Right lower leg, Left lower leg, Face         Bathing assist Assist Level: Minimal Assistance - Patient > 75%     Upper Body Dressing/Undressing Upper body dressing   What is the patient wearing?: Pull over shirt    Upper body assist Assist Level: Supervision/Verbal cueing    Lower Body Dressing/Undressing Lower body dressing      What is the patient wearing?: Underwear/pull up     Lower body assist Assist for lower body dressing: Moderate Assistance - Patient 50 - 74%     Toileting Toileting    Toileting assist Assist for toileting: Minimal Assistance - Patient > 75%     Transfers Chair/bed transfer  Transfers assist     Chair/bed transfer assist level: Contact Guard/Touching assist     Locomotion Ambulation   Ambulation assist      Assist level: Contact Guard/Touching assist Assistive device: Walker-rolling Max distance: 30 ft    Walk 10 feet activity   Assist     Assist level: Contact Guard/Touching assist Assistive device: Walker-rolling   Walk 50 feet activity   Assist    Assist level: Contact Guard/Touching assist Assistive device: Walker-rolling    Walk 150 feet activity   Assist  Walk 150 feet activity did not occur: Safety/medical concerns(fatigue )         Walk 10 feet on uneven surface  activity   Assist     Assist level: Minimal Assistance - Patient > 75% Assistive device: Aeronautical engineer Will patient use wheelchair at discharge?: No      Wheelchair assist level: Supervision/Verbal cueing Max wheelchair distance: 125f     Wheelchair 50 feet with 2 turns activity    Assist        Assist Level:  Supervision/Verbal cueing   Wheelchair 150 feet activity     Assist     Assist Level: Supervision/Verbal cueing    Medical Problem List and Plan: 1.  Decreased functional mobility secondary to traumatic SAH left sylvian fissure, left temporal, right frontal             -Continue CIR therapies including PT, OT, and SLP   Will need to use abdominal binder and thigh-high TED hose to avoid orthostatic hypotension. 2.  Antithrombotics: -DVT/anticoagulation: SCDs             -antiplatelet therapy: [facility]. Pain Management: Hydrocodone and Flexeril as needed 4. Mood: Zoloft 100 mg daily             -antipsychotic agents: N/A 5. Neuropsych: This patient is capable of making decisions on her own behalf. 6. Skin/Wound Care: Routine skin checks 7. Fluids/Electrolytes/Nutrition:  -encourage po  -protein supp for low albumin, check be met in a.m. 8.  Hypertension.  Lopressor 25 mg twice daily, Cozaar 100 mg daily, Norvasc 2.5 mg daily.  Monitor with increased mobility. Vitals:   10/14/18 1932 10/15/18 0450  BP: (!) 115/56 123/69  Pulse: 74 76  Resp: 16 18  Temp: 98.7 F (37.1 C) 98 F (36.7 C)  SpO2: 99% 99%  Will stop amlodipine 9.  CAD with stenting.  Plavix currently on hold. 10.  Diastolic congestive heart failure.     -check daily weights Filed Weights   10/12/18 1300 10/15/18 0450  Weight: 68.7 kg 66.9 kg   . 11.  PAF.  Cardiac rate controlled.  Eliquis on hold.  Monitor heart rate with increased physical activity. 12.  Ovarian cancer.  Follow-up [facility].chemotherapy currently on [facility]  Diet-controlled diabetes mellitus.  Patient currently on a regular diet.    14.  Hyperlipidemia.  Zetia/Lipitor 15.  Iron deficiency anemia? Normocytic however. Likely chronic disease element    Continue iron supplement.   -check iron panel Monday    -stools reported black  - hgb drifting down from 13.3 on  2/17 to 9.5 currently.   -today's hgb unchanged from yesterday's result  -stool heme negative  -no signs of gross blood loss Hemoglobin not low enough to cause her orthostasis 16. Nausea: may be anxiety related, ?meds  -resolved    LOS: 3 days A FACE TO FACE EVALUATION WAS PERFORMED  ACharlett Blake4/25/2020, 12:30 PM

## 2018-10-16 ENCOUNTER — Inpatient Hospital Stay (HOSPITAL_COMMUNITY): Payer: PPO | Admitting: Physical Therapy

## 2018-10-16 ENCOUNTER — Inpatient Hospital Stay (HOSPITAL_COMMUNITY): Payer: PPO

## 2018-10-16 NOTE — Progress Notes (Signed)
MD made aware of pt's bp and bp meds. MD said to go ahead and give bp meds as scheduled.RN explained to pt.

## 2018-10-16 NOTE — Progress Notes (Signed)
Occupational Therapy Session Note  Patient Details  Name: [name] MRN: [id number] Date of Birth: [birth year]  Today's Date: 10/16/2018 OT Individual Time: 7800-4471 OT Individual Time Calculation (min): 42 min  18 min missed d/t fatigue/orthostatic vitals   Short Term Goals: Week 1:  OT Short Term Goal 1 (Week 1): n/a d/t ELOS  Skilled Therapeutic Interventions/Progress Updates:    Pt received supine with RN present administering pain medication for back pain. Skilled monitoring of hemodynamic stability throughout session. Supine pt's BP was 113/75. Pt transitioned to EOB with min A. Abdominal binder was donned with max A. Pt's BP EOB 88/58. Pt asymptomatic with hypotension. Pt instructed in ankle pumps to attempt and increase BP. Following 3 min of rest EOB- BP was 88/71. Decision made to transfer pt back to pt d/t orthostatics. From supine pt completed BUE strengthening circuit using level 1 theraband. Pt instructed and provided tactile cues to guide muscle activation. Pt's BP supine after several minutes rose to 137/88. RN notified of session's events. Pt then c/o fatigue and requesting to rest. 18 min skilled OT missed. Pt left supine with all needs met, bed alarm set. Pt encouraged to increase PO intake.    Therapy Documentation Precautions:  Precautions Precautions: Fall Precaution Comments: low frustration tolerance  Restrictions Weight Bearing Restrictions: No   Therapy/Group: Individual Therapy  Curtis Sites 10/16/2018, 1:17 PM

## 2018-10-16 NOTE — Progress Notes (Signed)
Physical Therapy Session Note  Patient Details  Name: Margaret [name] MRN: [id number] Date of Birth: Nov 13, 1940  Today's Date: 10/16/2018 PT Individual Time: 0705-0815 PT Individual Time Calculation (min): 70 min   Short Term Goals: Week 1:  PT Short Term Goal 1 (Week 1): STGs = LTGs due to ELOS   Skilled Therapeutic Interventions/Progress Updates:  Pt received in bed & agreeable to tx. Pt appears to be in better spirits than when this therapist saw her on Friday but pt does not remember therapist. Therapist donned B thigh high ted hose & tennis shoes total assist for time management & pt already with abdominal binder donned. BP supine = 109/63 mmHg (LUE), HR = 74 bpm. Pt transferred to sitting EOB with bed features & supervision, and after rest break bed>w/c via stand pivot with CGA and cuing for technique/hand placement with poor return demo. Provided seated rest break then vitals checked: BP = 95/62 mmHg (LUE, sitting), HR = 90 bpm. Pt conversing with therapist & denying any adverse symptoms. Therapist provided water & encouraged pt to drink. After sitting for another minute BP = 91/63 mmHg (LUE, sitting), HR = 84 bpm. Provided 2-3 more minutes of seated rest break & BP = 89/62 mmHg (LUE, sitting), HR = 81 bpm. Assisted pt back to bed via stand pivot with min assist and pt transfers sit>supine with supervision & bed features. Vitals checked with pt supine in bed: BP = 121/69 mmHg (LUE), HR =75 bpm. Pt agreeable to bed level exercises & performed the following BLE strengthening exercises with instructional cuing for technique: short arc quads, hip adduction pillow squeezes, & straight leg raises. Provided pt with orange theraband & attached it to bed; pt performed BUE tricep extensions with instructional cuing for technique. Pt reports need to use restroom and transfers to [facility] in same manner as noted above. Pt with continent void & performs peri hygiene without assistance. Urine noted to be slightly  malodorous & RN made aware. Pt returned to bed & provided rest break. Pt donned dentures with set up assist to obtain items. At end of session pt left in bed with alarm set & needs at hand, set up with meal tray. BP = 121/78 mmHg (LUE, supine), HR = 77 bpm  Pt completed 2 sets of 15 reps of all exercises & took rest breaks PRN 2/2 fatigue.   RN made aware of pt's BP during session.   Therapy Documentation Precautions:  Precautions Precautions: Fall Precaution Comments: low frustration tolerance  Restrictions Weight Bearing Restrictions: No  Pain: Pt reports some soreness in RLE when therapist donned ted hose that alleviated when finished.    Therapy/Group: Individual Therapy  Waunita Schooner 10/16/2018, 8:16 AM

## 2018-10-16 NOTE — Progress Notes (Signed)
[place] PHYSICAL MEDICINE & REHABILITATION PROGRESS NOTE   Subjective/Complaints: Discussed blood pressure with the patient and her nurse  ROS: Patient denies pain, shortness of breath, nausea vomiting diarrhea Objective:   No results found. Recent Labs    10/14/18 0515  WBC 10.9*  HGB 9.5*  HCT 28.7*  PLT 168   No results for input(s): NA, K, CL, CO2, GLUCOSE, BUN, CREATININE, CALCIUM in the last 72 hours.  Intake/Output Summary (Last 24 hours) at 10/16/2018 1105 Last data filed at 10/16/2018 0832 Gross per 24 hour  Intake 120 ml  Output -  Net 120 ml     Physical Exam: Vital Signs Blood pressure 102/67, pulse 72, temperature 98.6 F (37 C), resp. rate 16, height 5' 8"  (1.727 m), weight 63.8 kg, SpO2 99 %. Constitutional: No distress . Vital signs reviewed. Appears fatigued HEENT: EOMI, oral membranes moist Neck: supple Cardiovascular: RRR without murmur. No JVD    Respiratory: CTA Bilaterally without wheezes or rales. Normal effort    GI: BS +, non-tender, non-distended  Musculoskeletal:     Comments: No edema or tenderness in extremities  Neurological: fatigued,  oriented to person, place, and time.   Makes good eye contact with examiner.    Single word or short phrase level communication, follows commands Motor: Grossly 4+/5 throughout  Skin: Skin is warm and dry.  Psychiatric: flat     Assessment/Plan: 1. Functional deficits secondary to traumatic SAH which require 3+ hours per day of interdisciplinary therapy in a comprehensive inpatient rehab setting.  Physiatrist is providing close team supervision and 24 hour management of active medical problems listed below.  Physiatrist and rehab team continue to assess barriers to discharge/monitor patient progress toward functional and medical goals  Care Tool:  Bathing  Bathing activity did not occur: Refused Body parts bathed by patient: Right arm, Left arm, Chest, Abdomen, Front perineal area, Buttocks,  Right upper leg, Left upper leg, Right lower leg, Left lower leg, Face         Bathing assist Assist Level: Minimal Assistance - Patient > 75%     Upper Body Dressing/Undressing Upper body dressing   What is the patient wearing?: Pull over shirt    Upper body assist Assist Level: Supervision/Verbal cueing    Lower Body Dressing/Undressing Lower body dressing      What is the patient wearing?: Underwear/pull up, Pants     Lower body assist Assist for lower body dressing: Contact Guard/Touching assist     Toileting Toileting    Toileting assist Assist for toileting: Contact Guard/Touching assist     Transfers Chair/bed transfer  Transfers assist     Chair/bed transfer assist level: Minimal Assistance - Patient > 75%     Locomotion Ambulation   Ambulation assist      Assist level: Contact Guard/Touching assist Assistive device: Walker-rolling Max distance: 30 ft    Walk 10 feet activity   Assist     Assist level: Contact Guard/Touching assist Assistive device: Walker-rolling   Walk 50 feet activity   Assist    Assist level: Contact Guard/Touching assist Assistive device: Walker-rolling    Walk 150 feet activity   Assist Walk 150 feet activity did not occur: Safety/medical concerns(fatigue )         Walk 10 feet on uneven surface  activity   Assist     Assist level: Minimal Assistance - Patient > 75% Assistive device: Aeronautical engineer Will patient use wheelchair  at discharge?: No      Wheelchair assist level: Supervision/Verbal cueing Max wheelchair distance: 180f     Wheelchair 50 feet with 2 turns activity    Assist        Assist Level: Supervision/Verbal cueing   Wheelchair 150 feet activity     Assist     Assist Level: Supervision/Verbal cueing    Medical Problem List and Plan: 1.  Decreased functional mobility secondary to traumatic SAH left sylvian fissure, left  temporal, right frontal             -Continue CIR therapies including PT, OT, and SLP   Will need to use abdominal binder and thigh-high TED hose to avoid orthostatic hypotension. 2.  Antithrombotics: -DVT/anticoagulation: SCDs             -antiplatelet therapy: Chronic Plavix and Eliquis [facility]. Pain Management: Hydrocodone and Flexeril as needed 4. Mood: Zoloft 100 mg daily             -antipsychotic agents: N/A 5. Neuropsych: This patient is capable of making decisions on her own behalf. 6. Skin/Wound Care: Routine skin checks 7. Fluids/Electrolytes/Nutrition:  -encourage po  -protein supp for low albumin, check be met in a.m. 8.  Hypertension.  Lopressor 25 mg twice daily, Cozaar 100 mg daily, Norvasc 2.5 mg daily.  Monitor with increased mobility. Vitals:   10/16/18 0840 10/16/18 0842  BP: 102/67 102/67  Pulse: 77 72  Resp:    Temp:    SpO2: 99%   Discontinued amlodipine, did not receive on 10/16/2018 If blood pressure continues to run low may have to adjust other medications as well 9.  CAD with stenting.  Plavix currently on hold. 10.  Diastolic congestive heart failure.     -check daily weights Filed Weights   10/12/18 1300 10/15/18 0450 10/16/18 0522  Weight: 68.7 kg 66.9 kg 63.8 kg   . 11.  PAF.  Cardiac rate controlled on Lopressor.  Eliquis on hold.  Monitor heart rate with increased physical activity. 12.  Ovarian cancer.  Follow-up outpatient.chemotherapy currently on hold [facility].  Diet-controlled diabetes mellitus.  Patient currently on a regular diet.  No need to check CBGs on a regular basis   14.  Hyperlipidemia.  Zetia/Lipitor 15.  Iron deficiency anemia? Normocytic however. Likely chronic disease element    Continue iron supplement.   -check iron panel Monday    -stools reported black  - hgb drifting down from 13.3 on 2/17 to 9.5 currently.   -today's hgb unchanged from yesterday's result  -stool heme  negative  -no signs of gross blood loss Hemoglobin not low enough to cause her orthostasis 16. Nausea: may be anxiety related, ?meds  -resolved    LOS: 4 days A FACE TO FACE EVALUATION WAS PERFORMED  ACharlett Blake4/26/2020, 11:05 AM

## 2018-10-17 ENCOUNTER — Inpatient Hospital Stay (HOSPITAL_COMMUNITY): Payer: PPO

## 2018-10-17 ENCOUNTER — Inpatient Hospital Stay (HOSPITAL_COMMUNITY): Payer: PPO | Admitting: Occupational Therapy

## 2018-10-17 ENCOUNTER — Inpatient Hospital Stay (HOSPITAL_COMMUNITY): Payer: PPO | Admitting: Physical Therapy

## 2018-10-17 LAB — IRON AND TIBC
Iron: 28 ug/dL (ref 28–170)
Saturation Ratios: 15 % (ref 10.4–31.8)
TIBC: 188 ug/dL — ABNORMAL LOW (ref 250–450)
UIBC: 160 ug/dL

## 2018-10-17 LAB — CBC
HCT: 30.2 % — ABNORMAL LOW (ref 36.0–46.0)
Hemoglobin: 9.9 g/dL — ABNORMAL LOW (ref 12.0–15.0)
MCH: 31.1 pg (ref 26.0–34.0)
MCHC: 32.8 g/dL (ref 30.0–36.0)
MCV: 95 fL (ref 80.0–100.0)
Platelets: 247 10*3/uL (ref 150–400)
RBC: 3.18 MIL/uL — ABNORMAL LOW (ref 3.87–5.11)
RDW: 16.9 % — ABNORMAL HIGH (ref 11.5–15.5)
WBC: 10.7 10*3/uL — ABNORMAL HIGH (ref 4.0–10.5)
nRBC: 0 % (ref 0.0–0.2)

## 2018-10-17 LAB — FERRITIN: Ferritin: 494 ng/mL — ABNORMAL HIGH (ref 11–307)

## 2018-10-17 MED ORDER — SODIUM CHLORIDE 0.9% FLUSH
10.0000 mL | INTRAVENOUS | Status: DC | PRN
  Administered 2018-10-19 – 2018-10-24 (×2): 10 mL
  Filled 2018-10-17 (×2): qty 40

## 2018-10-17 MED ORDER — SODIUM CHLORIDE 0.9% FLUSH
10.0000 mL | Freq: Two times a day (BID) | INTRAVENOUS | Status: DC
  Administered 2018-10-18 – 2018-10-23 (×4): 10 mL

## 2018-10-17 NOTE — Progress Notes (Signed)
Occupational Therapy Session Note  Patient Details  Name: Margaret [name] MRN: [id number] Date of Birth: [birth year]  Today's Date: 10/17/2018 OT Individual Time: 1232-1316 OT Individual Time Calculation (min): 44 min   Short Term Goals: Week 1:  OT Short Term Goal 1 (Week 1): n/a d/t ELOS  Skilled Therapeutic Interventions/Progress Updates:    Pt greeted in bed, asleep, easily woken. ADL needs met and agreeable to tx. BP while supine: 146/82 (Teds already donned). She transitioned to sitting with supervision and we donned abdominal binder. Pt verbalized she used to like to play tennis, so she played tennis with OT, volleying using racket and small soft ball. BP after 5 minutes of EOB activity: 136/90. Pt still asymptomatic, smiling, and actively engaged. Before transitioning back to supine, BP 110/76. Pt next removed abdominal binder and laid back down. She was left with all needs within reach and bed alarm set. Tx focus placed on sitting balance, activity tolerance, and maintaining stable vitals during meaningful activity.   Therapy Documentation Precautions:  Precautions Precautions: Fall Precaution Comments: low frustration tolerance  Restrictions Weight Bearing Restrictions: No Vital Signs: Therapy Vitals Temp: 97.8 F (36.6 C) Temp Source: Oral Pulse Rate: 69 Resp: 18 BP: 139/84 Patient Position (if appropriate): Lying Oxygen Therapy SpO2: 100 % O2 Device: Room Air Pain: No c/o pain during session    ADL: ADL Upper Body Dressing: Supervision/safety Lower Body Dressing: Moderate assistance, Moderate cueing Where Assessed-Lower Body Dressing: Edge of bed Toileting: Moderate assistance Where Assessed-Toileting: Bedside Commode Toilet Transfer: Minimal assistance Toilet Transfer Method: Stand pivot Toilet Transfer Equipment: Bedside commode     Therapy/Group: Individual Therapy  Bertie Simien A Mikalah Skyles 10/17/2018, 3:41 PM

## 2018-10-17 NOTE — Progress Notes (Signed)
Occupational Therapy Session Note  Patient Details  Name: Margaret [name] MRN: [id number] Date of Birth: Jul 14, 1940  Today's Date: 10/17/2018 OT Individual Time: 1400-1515 OT Individual Time Calculation (min): 75 min    Short Term Goals: Week 1:  OT Short Term Goal 1 (Week 1): n/a d/t ELOS  Skilled Therapeutic Interventions/Progress Updates:    Pt received supine with no c/o pain. Skilled monitoring of hemodynamic stability throughout session.  BP supine 149/71. Pt completed light AROM in BLE to prepare for bed mobility to EOB. Pt transferred to EOB with min A and BP was 119/73.  Upon sitting EOB for 5 min with teds and not abdominal binder, BP fell to 94/74. Pt quickly completed UB bathing and dressing and abdominal binder was donned. BP at 95/66. Following several minutes of sitting EOB BP 80/56. Pt asymptomatic and requesting to use BSC. BP reassessed following CGA SPT with RW, BP: 85/64. Pt able to don and doff underwear with close (S) sit <> stand with RW. Pt transferred back to EOB with close (S). Pt donned pants close (S). Pt required frequent seated rest break throughout session and increased time to complete activities. Pt returned to supine and was left with all needs met.  Pt more talkative throughout session and despite hypotension, more endurance this session overall.   Therapy Documentation Precautions:  Precautions Precautions: Fall Precaution Comments: low frustration tolerance  Restrictions Weight Bearing Restrictions: No   Therapy/Group: Individual Therapy  Curtis Sites 10/17/2018, 2:26 PM

## 2018-10-17 NOTE — Progress Notes (Signed)
[place] PHYSICAL MEDICINE & REHABILITATION PROGRESS NOTE   Subjective/Complaints: Pt up and in good spirits. Feels rested. No pain except for pain on tailbone where she fell.   ROS: Patient denies fever, rash, sore throat, blurred vision, nausea, vomiting, diarrhea, cough, shortness of breath or chest pain,  headache, or mood change.    Objective:   No results found. No results for input(s): WBC, HGB, HCT, PLT in the last 72 hours. No results for input(s): NA, K, CL, CO2, GLUCOSE, BUN, CREATININE, CALCIUM in the last 72 hours.  Intake/Output Summary (Last 24 hours) at 10/17/2018 0831 Last data filed at 10/16/2018 1258 Gross per 24 hour  Intake 360 ml  Output -  Net 360 ml     Physical Exam: Vital Signs Blood pressure 111/61, pulse 80, temperature 97.7 F (36.5 C), temperature source Oral, resp. rate 16, height 5\' 8"  (1.727 m), weight 64.6 kg, SpO2 100 %. Constitutional: No distress . Vital signs reviewed. HEENT: EOMI, oral membranes moist Neck: supple Cardiovascular: RRR without murmur. No JVD    Respiratory: CTA Bilaterally without wheezes or rales. Normal effort    GI: BS +, non-tender, non-distended  Musculoskeletal:     Comments: tenderness at coccyx  Noted.  Neurological: alert and oriented. Improved language and insight Motor: Grossly 4+/5 throughout  Skin: Skin is warm and dry.  Scattered bruising Psychiatric: pleasant and cooperative     Assessment/Plan: 1. Functional deficits secondary to traumatic SAH which require 3+ hours per day of interdisciplinary therapy in a comprehensive inpatient rehab setting.  Physiatrist is providing close team supervision and 24 hour management of active medical problems listed below.  Physiatrist and rehab team continue to assess barriers to discharge/monitor patient progress toward functional and medical goals  Care Tool:  Bathing  Bathing activity did not occur: Refused Body parts bathed by patient: Right arm, Left arm,  Chest, Abdomen, Front perineal area, Buttocks, Right upper leg, Left upper leg, Right lower leg, Left lower leg, Face         Bathing assist Assist Level: Minimal Assistance - Patient > 75%     Upper Body Dressing/Undressing Upper body dressing   What is the patient wearing?: Pull over shirt    Upper body assist Assist Level: Supervision/Verbal cueing    Lower Body Dressing/Undressing Lower body dressing      What is the patient wearing?: Underwear/pull up, Pants     Lower body assist Assist for lower body dressing: Contact Guard/Touching assist     Toileting Toileting    Toileting assist Assist for toileting: Contact Guard/Touching assist     Transfers Chair/bed transfer  Transfers assist     Chair/bed transfer assist level: Minimal Assistance - Patient > 75%     Locomotion Ambulation   Ambulation assist      Assist level: Contact Guard/Touching assist Assistive device: Walker-rolling Max distance: 30 ft    Walk 10 feet activity   Assist     Assist level: Contact Guard/Touching assist Assistive device: Walker-rolling   Walk 50 feet activity   Assist    Assist level: Contact Guard/Touching assist Assistive device: Walker-rolling    Walk 150 feet activity   Assist Walk 150 feet activity did not occur: Safety/medical concerns(fatigue )         Walk 10 feet on uneven surface  activity   Assist     Assist level: Minimal Assistance - Patient > 75% Assistive device: Aeronautical engineer Will patient  use wheelchair at discharge?: No      Wheelchair assist level: Supervision/Verbal cueing Max wheelchair distance: 15ft     Wheelchair 50 feet with 2 turns activity    Assist        Assist Level: Supervision/Verbal cueing   Wheelchair 150 feet activity     Assist     Assist Level: Supervision/Verbal cueing    Medical Problem List and Plan: 1.  Decreased functional mobility secondary to  traumatic SAH left sylvian fissure, left temporal, right frontal             -Continue CIR therapies including PT, OT, and SLP     2.  Antithrombotics: -DVT/anticoagulation: SCDs             -antiplatelet therapy: Chronic Plavix and Eliquis held due to [facility] 3. Pain Management: Hydrocodone and Flexeril as needed  -ice prn to coccyx. Avoid sitting for prolonged periods on coccyx, esp on hard surfaces 4. Mood: Zoloft 100 mg daily             -antipsychotic agents: N/A 5. Neuropsych: This patient is capable of making decisions on her own behalf. 6. Skin/Wound Care: Routine skin checks 7. Fluids/Electrolytes/Nutrition:  -encourage po  -protein supp for low albumin,   -bmet pending today 8.  Hypertension.  Lopressor 25 mg twice daily, Cozaar 100 mg daily, Norvasc 2.5 mg daily.  Monitor with increased mobility. Vitals:   10/16/18 1942 10/17/18 0412  BP: (!) 165/71 111/61  Pulse: 76 80  Resp: 16 16  Temp: 98.8 F (37.1 C) 97.7 F (36.5 C)  SpO2: 97% 100%  -amlodipine stopped for hypotension -TEDS/abd binders as needed -observe for further pattern 9.  CAD with stenting.  Plavix currently on hold. 10.  Diastolic congestive heart failure.     -weights appear stable Filed Weights   10/15/18 0450 10/16/18 0522 10/17/18 0410  Weight: 66.9 kg 63.8 kg 64.6 kg   . 11.  PAF.  Cardiac rate controlled on Lopressor.  Eliquis on hold.  Monitor heart rate with increased physical activity. 12.  Ovarian cancer.  Follow-up outpatient.chemotherapy currently on hold until rehabilitation completed for [facility] 13.  Diet-controlled diabetes mellitus.  Patient currently on a regular diet.  No need to check CBGs on a regular basis   14.  Hyperlipidemia.  Zetia/Lipitor 15.  Iron deficiency anemia? Normocytic however. Likely chronic disease element    Continue iron supplement.   -iron panel pending   -stools reported black  - hgb drifting down from 13.3 on 2/17 to 9.5 most recently   -today's hgb unchanged from  yesterday's result  -stool heme negative  -no signs of gross blood loss Hemoglobin not low enough to cause her orthostasis 16. Nausea:  ?meds  -resolved    LOS: 5 days A FACE TO FACE EVALUATION WAS PERFORMED  Meredith Staggers 10/17/2018, 8:31 AM

## 2018-10-17 NOTE — Progress Notes (Addendum)
Physical Therapy Session Note  Patient Details  Name: Margaret [name] MRN: [id number] Date of Birth: 08/19/1940  Today's Date: 10/17/2018 PT Individual Time: 0919-1004 PT Individual Time Calculation (min): 45 min   Short Term Goals: Week 1:  PT Short Term Goal 1 (Week 1): STGs = LTGs due to ELOS   Skilled Therapeutic Interventions/Progress Updates:  Pt received in bed & agreeable to tx. Pt reports pain in her "butt" when she moves & rest breaks provided. Therapist dons B thigh high teds & abdominal binder from bed level to assist with BP management. Threaded pants on pt's BLE max assist and pt requires multiple bridges & cuing to completely pull pants over hips. Pt rolls L<>R with supervision with use of bed rails. BP assessed throughout session (see below) with therapist provided skilled monitoring of pt's BP during positional changes.  Pt transfers to sitting EOB with bed rails & supervision and performs BUE shoulder flexion AROM exercises to attempt to increase BP. Pt transfers to w/c via stand pivot with CGA. Therapist provides encouragement for pt to increase fluid intake during session. RN made aware of pt's fluctuating BP & after discussion RN clears pt for participation in therapy from w/c level & is okay with pt sitting in w/c at end of session & plans to check on her periodically; therapist educated pt to call for nursing assistance if she becomes symptomatic sitting in w/c. Pt propels w/c to nurses station with Garey supervision for cardiopulmonary endurance training & BUE strengthening. At end of session pt left sitting in w/c in room with call bell in reach & RN notified of need to get chair alarm box.     Therapy Documentation Precautions:  Precautions Precautions: Fall Restrictions Weight Bearing Restrictions: No   Vital Signs: Supine:  BP = 155/82 mmHg (LUE) HR = 72 bpm  Sitting EOB at 1 minute: BP = 86/56 mmHg (LUE) HR = 76 bpm  Pt asymptomatic.  Sitting EOB at ~3  minutes after performing BUE shoulder flexion AROM exercises: BP = 93/56 mmHg (LUE) HR = 89 bpm  Pt asymptomatic.  Sitting EOB at ~6 minutes:  BP = 100/74 mmHg (LUE) HR = 89 bpm  Pt reports feeling a little lightheaded.  Sitting in w/c:  BP = 88/66 mmHg (LUE) HR = 93 bpm  Pt c/o feeling a little lightheaded.  Sitting in w/c after ~2 minutes:  BP = 97/66 mmHg (LUE) HR = 79 bpm Pt reports she is asymptomatic.  Sitting in w/c at end of session: BP = 108/82 mmHg (LUE) HR = 79 bpm   Therapy/Group: Individual Therapy  Waunita Schooner 10/17/2018, 10:07 AM

## 2018-10-18 ENCOUNTER — Encounter (HOSPITAL_COMMUNITY): Payer: PPO | Admitting: Psychology

## 2018-10-18 ENCOUNTER — Inpatient Hospital Stay (HOSPITAL_COMMUNITY): Payer: PPO | Admitting: Physical Therapy

## 2018-10-18 ENCOUNTER — Inpatient Hospital Stay (HOSPITAL_COMMUNITY): Payer: PPO

## 2018-10-18 DIAGNOSIS — S065X9A Traumatic subdural hemorrhage with loss of consciousness of unspecified duration, initial encounter: Secondary | ICD-10-CM

## 2018-10-18 LAB — BASIC METABOLIC PANEL
Anion gap: 9 (ref 5–15)
BUN: 27 mg/dL — ABNORMAL HIGH (ref 8–23)
CO2: 23 mmol/L (ref 22–32)
Calcium: 9.4 mg/dL (ref 8.9–10.3)
Chloride: 111 mmol/L (ref 98–111)
Creatinine, Ser: 0.87 mg/dL (ref 0.44–1.00)
GFR calc Af Amer: >60 mL/min (ref >60)
GFR calc non Af Amer: >60 mL/min (ref >60)
Glucose, Bld: 90 mg/dL (ref 70–99)
Potassium: 4 mmol/L (ref 3.5–5.1)
Sodium: 143 mmol/L (ref 135–145)

## 2018-10-18 LAB — PREALBUMIN: Prealbumin: 13.2 mg/dL — ABNORMAL LOW (ref 18–38)

## 2018-10-18 LAB — CBC
HCT: 30.5 % — ABNORMAL LOW (ref 36.0–46.0)
Hemoglobin: 9.9 g/dL — ABNORMAL LOW (ref 12.0–15.0)
MCH: 31.1 pg (ref 26.0–34.0)
MCHC: 32.5 g/dL (ref 30.0–36.0)
MCV: 95.9 fL (ref 80.0–100.0)
Platelets: 265 10*3/uL (ref 150–400)
RBC: 3.18 MIL/uL — ABNORMAL LOW (ref 3.87–5.11)
RDW: 17 % — ABNORMAL HIGH (ref 11.5–15.5)
WBC: 8.5 10*3/uL (ref 4.0–10.5)
nRBC: 0 % (ref 0.0–0.2)

## 2018-10-18 MED ORDER — MEGESTROL ACETATE 400 MG/10ML PO SUSP
400.0000 mg | Freq: Two times a day (BID) | ORAL | Status: DC
  Administered 2018-10-18 – 2018-10-25 (×14): 400 mg via ORAL
  Filled 2018-10-18 (×14): qty 10

## 2018-10-18 MED ORDER — AMLODIPINE BESYLATE 2.5 MG PO TABS
2.5000 mg | ORAL_TABLET | Freq: Every day | ORAL | Status: DC
  Administered 2018-10-20 – 2018-10-25 (×5): 2.5 mg via ORAL
  Filled 2018-10-18 (×8): qty 1

## 2018-10-18 NOTE — Progress Notes (Signed)
Slept well throughout the night. No complaints verbalizedl.

## 2018-10-18 NOTE — Progress Notes (Signed)
Physical Therapy Session Note  Patient Details  Name: Margaret [name] MRN: [id number] Date of Birth: [birth year]  Today's Date: 10/18/2018 PT Individual Time: 2122-4825 PT Individual Time Calculation (min): 42 min   Short Term Goals: Week 2:  PT Short Term Goal 1 (Week 2): STGs = LTGs due to ELOS   Skilled Therapeutic Interventions/Progress Updates:  Pt received in bed & agreeable to tx. Educated pt on purpose of therapy and need for participation. Pt performed rolling L<>R with supervision and bed rails and extra time to follow one step commands; therapist dons abdominal binder total assist for BP management (thigh high teds already donned). BP assessed throughout session, see below. Pt transfers supine>sitting EOB with supervision and hospital bed features. Pt transfers to w/c with min assist for eccentric lowering 2/2 poor control & safety awareness. Pt propels w/c with BUE and supervision ~65 ft with cuing to avoid wall on L. Pt demonstrates slightly decreased attention to L on this date. Pt transfers w/c<>nu-step with CGA<>min assist and cuing for technique/hand placement. Pt utilizes nu-step on level 2 x 3 minutes 40 seconds + 2 minutes with task focusing on global strengthening & endurance training & therapist providing encouragement to continue until absolutely necessary to rest 2/2 fatigue. Pt asymptomatic during activity. At end of session pt left sitting in w/c in room with chair alarm donned & set up with meal tray, encouraged pt to eat and drink a lot of liquids to assist with BP management.    Pt asymptomatic throughout session except for c/o fatigue.   Pt with no c/o pain during session.   Therapy Documentation Precautions:  Precautions Precautions: Fall Precaution Comments: low frustration tolerance  Restrictions Weight Bearing Restrictions: No   Vital Signs: Supine: BP = 126/78 mmHg (LUE) HR = 64 bpm  Sitting EOB after 2 minutes: BP = 104/66 mmHg (LUE) HR = 71 bpm Pt  asymptomatic.  Sitting after transferring to w/c: BP = 89/65 mmHg (LUE) HR = 72 bpm Pt asymptomatic.   After resting in w/c for ~1 minute: BP = 106/64 mmHg (LUE) HR = 67 bpm  After resting in w/c for 3 minutes: BP = 106/66 mmHg (LUE) HR = 64 bpm Pt asymptomatic.  Sitting in w/c at end of session: BP = 103/67 mmHg (LUE) HR = 68 bpm    Therapy/Group: Individual Therapy  Waunita Schooner 10/18/2018, 12:39 PM

## 2018-10-18 NOTE — Progress Notes (Signed)
Occupational Therapy Session Note  Patient Details  Name: Margaret [name] MRN: [id number] Date of Birth: 22-May-1941  Today's Date: 10/18/2018 OT Individual Time: 1330-1425 OT Individual Time Calculation (min): 55 min    Short Term Goals: Week 1:  OT Short Term Goal 1 (Week 1): n/a d/t ELOS  Skilled Therapeutic Interventions/Progress Updates:    Pt received in w/c c/o fatigue but agreeable to session with encouragement. Pt's BP 109/67 sitting in w/c. Pt was transported to tub room to discuss need for shower chair. Pt initially reported having small seat in shower but upon further discussion, reported it was only big enough to hold "3 bottles of shampoo or so". Stressed need for seat in shower d/t fatigue and low endurance levels and option to complete sink bathing instead. Pt was brought to IADL apt and a simulated walk in shower was set up for pt to practice transfer. Pt became angry stating "doing this makes no sense, I've done this for 15 years". Provided emotional support and edu re condition and deficits causing pt to be a high fall risk. Pt became emotional and disclosed home stressors and apprehensions going home. Emotional support and motivational interviewing techniques used to support pt in choosing goals for remainder of therapy and to recognize importance of participation in ADLs/IADLs for self-efficacy. Pt returned to her room total A for time management. Pt completed 8 ft of functional mobility with RW with CGA. Pt transferred back to bed and was left supine with all needs met, bed alarm set.   Therapy Documentation Precautions:  Precautions Precautions: Fall Precaution Comments: low frustration tolerance  Restrictions Weight Bearing Restrictions: No   Therapy/Group: Individual Therapy  Curtis Sites 10/18/2018, 7:12 AM

## 2018-10-18 NOTE — Progress Notes (Signed)
Labs drawn at [place] not working

## 2018-10-18 NOTE — Progress Notes (Signed)
Physical Therapy Session Note  Patient Details  Name: Margaret [name] MRN: [id number] Date of Birth: 1940/09/17  Today's Date: 10/18/2018 PT Individual Time: 0945-1010 PT Individual Time Calculation (min): 25 min   Short Term Goals: Week 1:  PT Short Term Goal 1 (Week 1): STGs = LTGs due to ELOS   Skilled Therapeutic Interventions/Progress Updates:    pt rec'd in bed. Very frustrated and restless, stating "I don't know why people who I don't know keep coming into my home.  How did I get mixed up in all of this".  PT reoriented patient to place and situation. Pt nods head in agreement but continues to perseverate on "How did I get mixed up in this".  Pt BP supine: 140/82.  BP seated 104/78.  Pt asymptomatic. PT encourages pt to stand and attempt gait training. Pt sits x 2 minutes but then lays back down, refusing to get out of bed or participate in sitting on EOB activity.  Pt states "I just want to sleep".  PT continues to encourage pt, but pt continues to refuse.  RN aware of pt's increased confusion this session.  Therapy Documentation Precautions:  Precautions Precautions: Fall Precaution Comments: low frustration tolerance  Restrictions Weight Bearing Restrictions: No General: PT Amount of Missed Time (min): 20 Minutes PT Missed Treatment Reason: Patient unwilling to participate Pain:  no c/o pain   Therapy/Group: Individual Therapy  Kobee Medlen 10/18/2018, 10:11 AM

## 2018-10-18 NOTE — Consult Note (Signed)
Neuropsychological Consultation   Patient:   Margaret [name]   DOB:   25-Sep-1940  MR Number:  [id number]  Location:  [facility] [id number] [place] [address] Dept: [place]: [phone]           Date of Service:   10/18/2018  Start Time:   3 PM End Time:   4 PM  Provider/Observer:  Ilean Skill, Psy.D.       Clinical Neuropsychologist       Billing Code/Service: [phone]  Chief Complaint:    Margaret [name] is a 78 year old female with history f CAD, diastolic congestive heart failure, diabetes, PAF as well as ovarian cancer maintained on chemotherapy.  Patient has history of prior CVA as a right PCA territory infarct involving right dorsal thalamus, hippocampus and occipital lobe.  Visual hallucination likely from occipital lobe infarctions and left superior homonymous hemianopsia.  Also history of depression.  Presented on 10/08/2018 after fall few days earlier.  Patient with increased lethargy.  CT scan showed small bilateral subarachnoid hemorrhages, greater right than left.  There was also indications of chronic ischemic changes including a large right PCA infarction.  Follow-up CT scan showed hemorrhage stable.  The patient has had times of confusion with variability during the day.  Greater confusion upon waking.  Patient does acknowledge times of confusion and had difficulty easily explaining where she was currently within the hospital system.  Reason for Service:  The patient was referred for neuropsychological consultation due to coping and adjustment issues following her subarachnoid hemorrhage.  The patient does have a history of depression in the past and prior CVA history with previous stroke in 2018 and significant small vessel disease.  Below is the HPI for the current admission.  HPI: [name] is a 78 year old right-handed female with history of CAD with stenting maintained  on Plavix, diastolic congestive heart failure, diet-controlled diabetes mellitus, PAF maintained on Eliquis followed by Dr. Martinique as well as ovarian cancer maintained on chemotherapy.  Patient lives with spouse.  One level home 4 steps to entry.  Ambulates with a rolling walker.  Needed assist with some household tasks from husband.  History taken from chart review and patient.  She presented on 10/08/2018 after a fall few days PTA with increased lethargy.  No nausea vomiting.  Cranial CT scan reviewed, showing small bilateral subarachnoid hemorrhages, right greater than left.  Chronic ischemic changes including a large right PCA infarction.  Eliquis and Plavix were held due to [facility].  Neurosurgery follow-up Dr. Vertell Limber recommended conservative care with follow-up head CT.  Follow-up head CT on 10/08/2018 reviewed, stable.  Recommendations per cardiology, to continue to hold anticoagulation.  Tolerating a regular diet.  Therapy evaluations completed with recommendations of physical medicine rehab consult.  Please see preadmission assessment from earlier today as well.  Current Status:  The patient was initially cognizant and showing good verbal interaction and mental flexibility.  However, as questions became more specific the patient showed difficulty with mental status including situation and location.  The patient struggled giving accurate date but was successful.  The patient knew that she was in some unit related to [facility] health system was not clear that she was in the main campus hospital itself.  The patient was not able to explain what it happened to her medically very well and assumed that she had had some type of a stroke versus subarachnoid hemorrhage.  She was aware of previous stroke.  The patient did quite well covering up some of the absence of information that she had but has discussions progressed over time she had greater and greater difficulties with new information and learning.  1 of the questions  is how much of this is a worsening of her mental status or symptoms related to changes in sleep or sleep disturbance versus cognition difficulties becoming more evident that may have been pre-existing.  The patient did have visual hallucinations back in 2018 that were felt to be primarily related to her occipital stroke producing visual disturbance that she was misinterpreting or visual disturbance making it hard for her to put pieces together about what she was actually seen.  The patient has displayed homonymous hemianopsia and this may be playing a role in some of her confusion about where she is and difficulty putting pieces together.  Behavioral Observation: Ami Mally  presents as a 78 y.o.-year-old Right Caucasian Female who appeared her stated age. her dress was Appropriate and she was Well Groomed and her manners were Appropriate to the situation.  her participation was indicative of Appropriate and Redirectable behaviors.  There were any physical disabilities noted.  she displayed an appropriate level of cooperation and motivation.     Interactions:    Active Appropriate, Inattentive and Redirectable  Attention:   abnormal and attention span appeared shorter than expected for age  Memory:   abnormal; remote memory intact, recent memory impaired  Visuo-spatial:  While specific objective assessment of visual-spatial abilities were not made today the patient does have a history of significant disturbance in visual-spatial abilities related to previous occipital and thalamic stroke.  The patient has had visual hallucinations that are related to the stroke where it is likely her PTO area was putting together aberrant information and misinterpreting what she was saying and then turning it into a visual gestalt.  Speech (Volume):  normal  Speech:   normal; normal  Thought Process:  Coherent and Circumstantial  Though Content:  WNL; not suicidal and not homicidal  Orientation:   person and  situation  Judgment:   Poor  Planning:   Poor  Affect:    Appropriate  Mood:    Anxious  Insight:   Fair  Intelligence:   high  Medical History:   Past Medical History:  Diagnosis Date  . Acetabular fracture ([place])   . Anxiety and depression   . Bartholin cyst   . CAD (coronary artery disease)   . Cancer ([place])    mouth cyst was cancer many years ago   . Cellulitis of right leg 2005  . CHF (congestive heart failure) ([place])   . Colon polyp   . Complication of anesthesia   . Diabetes mellitus without complication ([place])   . DJD (degenerative joint disease)   . Dyspnea on exertion   . Dysrhythmia    PAF  . GERD (gastroesophageal reflux disease)   . Hyperlipidemia   . Hypertension   . Morbid obesity ([place])   . Myocardial infarction ([place]) 06/14/2017  . Ovarian cancer ([place])   . PAF (paroxysmal atrial fibrillation) ([place])    a. diagnosed 7/19; b. CHADS2VASc 8; c. Eliquis  . PONV (postoperative nausea and vomiting)   . Presence of stent in right coronary artery 02/08/12   2.5x18 Xience distal RCA  . Right shoulder pain   . Stroke ([place]) 2017  . Vaginal prolapse     Psychiatric History:  The patient does have a  history of depression that was likely related to her diagnosis of significant medical issues including ovarian cancer.  Family Med/Psych History:  Family History  Problem Relation Age of Onset  . Heart attack Father        MI age 51 and 97  . Heart attack Sister 10       cabg; deceased 66  . Diabetes Sister   . Hypertension Sister   . Heart attack Brother 60       cabg; deceased 64s  . Diabetes Brother   . Colon cancer Mother 39       deceased 78  . Diabetes Maternal Grandmother   . Cancer Maternal Grandfather        unknown type; deceased  . Hypertension Daughter   . Thyroid disease Daughter   . Breast cancer Neg Hx     Impression/DX:  Sharnise Blough is a 78 year old female with history f CAD, diastolic congestive heart failure, diabetes, PAF as well as  ovarian cancer maintained on chemotherapy.  Patient has history of prior CVA as a right PCA territory infarct involving right dorsal thalamus, hippocampus and occipital lobe.  Visual hallucination likely from occipital lobe infarctions and left superior homonymous hemianopsia.  Also history of depression.  Presented on 10/08/2018 after fall few days earlier.  Patient with increased lethargy.  CT scan showed small bilateral subarachnoid hemorrhages, greater right than left.  There was also indications of chronic ischemic changes including a large right PCA infarction.  Follow-up CT scan showed hemorrhage stable.  The patient has had times of confusion with variability during the day.  Greater confusion upon waking.  Patient does acknowledge times of confusion and had difficulty easily explaining where she was currently within the hospital system.  The patient was initially cognizant and showing good verbal interaction and mental flexibility.  However, as questions became more specific the patient showed difficulty with mental status including situation and location.  The patient struggled giving accurate date but was successful.  The patient knew that she was in some unit related to [facility] health system was not clear that she was in the main campus hospital itself.  The patient was not able to explain what it happened to her medically very well and assumed that she had had some type of a stroke versus subarachnoid hemorrhage.  She was aware of previous stroke.  The patient did quite well covering up some of the absence of information that she had but has discussions progressed over time she had greater and greater difficulties with new information and learning.  1 of the questions is how much of this is a worsening of her mental status or symptoms related to changes in sleep or sleep disturbance versus cognition difficulties becoming more evident that may have been pre-existing.  The patient did have visual  hallucinations back in 2018 that were felt to be primarily related to her occipital stroke producing visual disturbance that she was misinterpreting or visual disturbance making it hard for her to put pieces together about what she was actually seen.  The patient has displayed homonymous hemianopsia and this may be playing a role in some of her confusion about where she is and difficulty putting pieces together.          Electronically Signed   _______________________ Ilean Skill, Psy.D.

## 2018-10-18 NOTE — Progress Notes (Signed)
Physical Therapy Weekly Progress Note  Patient Details  Name: Margaret [name] MRN: [id number] Date of Birth: 1941/02/06  Beginning of progress report period: October 13, 2018 End of progress report period: October 18, 2018  Today's Date: 10/18/2018   Pt is making slow progress towards LTG's due to poor activity tolerance and now pt is limited by orthostatic hypotension despite use of thigh high ted hose and abdominal binder. Overall, pt is currently able to perform bed mobility with supervision and hospital bed features, propel w/c with BUE & supervision, complete stand pivot transfers with CGA, and ambulate up to 30 ft with RW & CGA. Pt demonstrates some impaired memory issues and is being screened by SLP. Pt would benefit from continued skilled PT treatment to focus on transfers, gait, endurance, balance, activity tolerance, and for pt/family education to ensure safe d/c plan prior to d/c home.  Patient continues to demonstrate the following deficits muscle weakness, decreased cardiorespiratory endurance, decreased coordination, decreased safety awareness and decreased memory, and decreased standing balance, decreased postural control and decreased balance strategies and therefore will continue to benefit from skilled PT intervention to increase functional independence with mobility.  Patient progressing toward long term goals..  Continue plan of care.  PT Short Term Goals Week 1:  PT Short Term Goal 1 (Week 1): STGs = LTGs due to ELOS  Week 2:  PT Short Term Goal 1 (Week 2): STGs = LTGs due to ELOS    Therapy Documentation Precautions:  Precautions Precautions: Fall Precaution Comments: low frustration tolerance  Restrictions Weight Bearing Restrictions: No   Therapy/Group: Individual Therapy  Waunita Schooner 10/18/2018, 11:20 AM

## 2018-10-18 NOTE — Progress Notes (Signed)
[facility] PHYSICAL MEDICINE & REHABILITATION PROGRESS NOTE   Subjective/Complaints: Had a peaceful night. No new issues this am  ROS: Patient denies fever, rash, sore throat, blurred vision, nausea, vomiting, diarrhea, cough, shortness of breath or chest pain, joint or back pain, headache, or mood change.   Objective:   No results found. Recent Labs    10/17/18 1800  WBC 10.7*  HGB 9.9*  HCT 30.2*  PLT 247   No results for input(s): NA, K, CL, CO2, GLUCOSE, BUN, CREATININE, CALCIUM in the last 72 hours.  Intake/Output Summary (Last 24 hours) at 10/18/2018 0835 Last data filed at 10/18/2018 0700 Gross per 24 hour  Intake 240 ml  Output -  Net 240 ml     Physical Exam: Vital Signs Blood pressure (!) 173/90, pulse 69, temperature 97.9 F (36.6 C), temperature source Oral, resp. rate 17, height 5\' 8"  (1.727 m), weight 66 kg, SpO2 100 %. Constitutional: No distress . Vital signs reviewed. HEENT: EOMI, oral membranes moist Neck: supple Cardiovascular: RRR without murmur. No JVD    Respiratory: CTA Bilaterally without wheezes or rales. Normal effort    GI: BS +, non-tender, non-distended  Musculoskeletal:     Comments: tenderness at coccyx  Noted.  Neurological: alert and oriented. Improved language and insight Motor: remainsGrossly 4+/5 throughout  Skin: Skin is warm and dry.  Scattered bruising Psychiatric: pleasant   Assessment/Plan: 1. Functional deficits secondary to traumatic SAH which require 3+ hours per day of interdisciplinary therapy in a comprehensive inpatient rehab setting.  Physiatrist is providing close team supervision and 24 hour management of active medical problems listed below.  Physiatrist and rehab team continue to assess barriers to discharge/monitor patient progress toward functional and medical goals  Care Tool:  Bathing  Bathing activity did not occur: Refused Body parts bathed by patient: Right arm, Left arm, Chest, Abdomen, Front  perineal area, Buttocks, Right upper leg, Left upper leg, Right lower leg, Left lower leg, Face         Bathing assist Assist Level: Minimal Assistance - Patient > 75%     Upper Body Dressing/Undressing Upper body dressing   What is the patient wearing?: Pull over shirt    Upper body assist Assist Level: Supervision/Verbal cueing    Lower Body Dressing/Undressing Lower body dressing      What is the patient wearing?: Underwear/pull up, Pants     Lower body assist Assist for lower body dressing: Contact Guard/Touching assist     Toileting Toileting    Toileting assist Assist for toileting: Contact Guard/Touching assist     Transfers Chair/bed transfer  Transfers assist     Chair/bed transfer assist level: Contact Guard/Touching assist     Locomotion Ambulation   Ambulation assist      Assist level: Contact Guard/Touching assist Assistive device: Walker-rolling Max distance: 30 ft    Walk 10 feet activity   Assist     Assist level: Contact Guard/Touching assist Assistive device: Walker-rolling   Walk 50 feet activity   Assist    Assist level: Contact Guard/Touching assist Assistive device: Walker-rolling    Walk 150 feet activity   Assist Walk 150 feet activity did not occur: Safety/medical concerns(fatigue )         Walk 10 feet on uneven surface  activity   Assist     Assist level: Minimal Assistance - Patient > 75% Assistive device: Aeronautical engineer Will patient use wheelchair at discharge?: No Type  of Wheelchair: Manual    Wheelchair assist level: Supervision/Verbal cueing Max wheelchair distance: 100 ft    Wheelchair 50 feet with 2 turns activity    Assist        Assist Level: Supervision/Verbal cueing   Wheelchair 150 feet activity     Assist     Assist Level: Supervision/Verbal cueing    Medical Problem List and Plan: 1.  Decreased functional mobility secondary to  traumatic SAH left sylvian fissure, left temporal, right frontal             -Continue CIR therapies including PT, OT, and SLP     2.  Antithrombotics: -DVT/anticoagulation: SCDs             -antiplatelet therapy: Chronic Plavix and Eliquis held due to [facility] 3. Pain Management: Hydrocodone and Flexeril as needed  -ice prn to coccyx. Avoid sitting for prolonged periods on coccyx, esp on hard surfaces 4. Mood: Zoloft 100 mg daily             -antipsychotic agents: N/A 5. Neuropsych: This patient is capable of making decisions on her own behalf. 6. Skin/Wound Care: Routine skin checks 7. Fluids/Electrolytes/Nutrition:  -encourage po  -protein supp for low albumin,   -bmet never done 8.  Hypertension.  Lopressor 25 mg twice daily, Cozaar 100 mg daily, Norvasc 2.5 mg daily had been held due to hypotension---now elevated--resume  Vitals:   10/17/18 1953 10/18/18 0507  BP: (!) 157/78 (!) 173/90  Pulse: 69 69  Resp: 16 17  Temp: 98.2 F (36.8 C) 97.9 F (36.6 C)  SpO2: 100% 100%  --TEDS/abd binders as needed -observe for further pattern 9.  CAD with stenting.  Plavix currently on hold. 10.  Diastolic congestive heart failure.     -weights up today. ?accuracy Filed Weights   10/16/18 0522 10/17/18 0410 10/18/18 0507  Weight: 63.8 kg 64.6 kg 66 kg   . 11.  PAF.  Cardiac rate controlled on Lopressor.  Eliquis on hold.  Monitor heart rate with increased physical activity. 12.  Ovarian cancer.  Follow-up outpatient.chemotherapy currently on hold until rehabilitation completed for [facility] 13.  Diet-controlled diabetes mellitus.  Patient currently on a regular diet.  No need to check CBGs on a regular basis   14.  Hyperlipidemia.  Zetia/Lipitor 15.  Anemia: mild iron deficiency, more related to chronic disease  Continue iron supplement for now   - hgb drifting down from 13.3 on 2/17 to 9.5 most recently --up to 9.9 4/27   -stool heme negative  -no signs of gross blood loss   16. Nausea:   ?meds  -resolved    LOS: 6 days A FACE TO FACE EVALUATION WAS PERFORMED  Meredith Staggers 10/18/2018, 8:35 AM

## 2018-10-19 ENCOUNTER — Inpatient Hospital Stay (HOSPITAL_COMMUNITY): Payer: PPO

## 2018-10-19 ENCOUNTER — Inpatient Hospital Stay (HOSPITAL_COMMUNITY): Payer: PPO | Admitting: Speech Pathology

## 2018-10-19 LAB — URINALYSIS, COMPLETE (UACMP) WITH MICROSCOPIC
Bilirubin Urine: NEGATIVE
Glucose, UA: NEGATIVE mg/dL
Ketones, ur: NEGATIVE mg/dL
Nitrite: NEGATIVE
Protein, ur: NEGATIVE mg/dL
Specific Gravity, Urine: 1.019 (ref 1.005–1.030)
pH: 5 (ref 5.0–8.0)

## 2018-10-19 MED ORDER — SODIUM CHLORIDE 0.45 % IV SOLN
INTRAVENOUS | Status: DC
  Administered 2018-10-19: 21:00:00 via INTRAVENOUS

## 2018-10-19 MED ORDER — CEPHALEXIN 250 MG PO CAPS
250.0000 mg | ORAL_CAPSULE | Freq: Three times a day (TID) | ORAL | Status: DC
  Administered 2018-10-19 – 2018-10-25 (×18): 250 mg via ORAL
  Filled 2018-10-19 (×18): qty 1

## 2018-10-19 MED ORDER — ENSURE ENLIVE PO LIQD
237.0000 mL | Freq: Two times a day (BID) | ORAL | Status: DC
  Administered 2018-10-19 – 2018-10-25 (×12): 237 mL via ORAL

## 2018-10-19 NOTE — Progress Notes (Signed)
Initial Nutrition Assessment  RD working remotely.  DOCUMENTATION CODES:   Not applicable  INTERVENTION:   -Continue MVI with minerals daily -Continue 30 ml Prostat BID, each supplement provides 100 kcals and 15 grams protein -D/c Ensure Max -Ensure Enlive po BID, each supplement provides 350 kcal and 20 grams of protein  NUTRITION DIAGNOSIS:   Inadequate oral intake related to decreased appetite as evidenced by meal completion < 50%, percent weight loss.  GOAL:   Patient will meet greater than or equal to 90% of their needs  MONITOR:   PO intake, Supplement acceptance, Labs, Weight trends, Skin, I & O's  REASON FOR ASSESSMENT:   Consult Poor PO  ASSESSMENT:   Margaret [name] is a 78 year old right-handed female with history of CAD with stenting maintained on Plavix, diastolic congestive heart failure, diet-controlled diabetes mellitus, PAF maintained on Eliquis followed by Dr. Martinique as well as ovarian cancer maintained on chemotherapy.  Patient lives with spouse.  One level home 4 steps to entry.  Ambulates with a rolling walker.  Needed assist with some household tasks from husband.  History taken from chart review and patient.  She presented on 10/08/2018 after a fall few days PTA with increased lethargy.  No nausea vomiting.  Cranial CT scan reviewed, showing small bilateral subarachnoid hemorrhages, right greater than left.  Chronic ischemic changes including a large right PCA infarction.  Eliquis and Plavix were held due to [facility].  Neurosurgery follow-up Dr. Vertell Limber recommended conservative care with follow-up head CT.  Follow-up head CT on 10/08/2018 reviewed, stable.  Recommendations per cardiology, to continue to hold anticoagulation.  Tolerating a regular diet.  Therapy evaluations completed with recommendations of physical medicine rehab consult.  Please see preadmission assessment from earlier today as well.  Pt admitted with decreased functional mobility secondary to  traumatic SAH.   4/28- Megace added  Reviewed I/O's: +80 ml x 24 hours and +1.5 L since admission  UOP: 400 ml x 24 hours  Attempted to speak with pt via phone, however, unable to reach. Phone message reports "call cannot be completed as dialed". Verified therapy schedule to ensure that pt was not scheduled with therapy during time of call.   Pt with poor appetite, per chart reviewed and meal completion documentation (averaging between 25-50% of meals). Per MD note, pt consumed only a piece of bacon this morning at breakfast.   Pt with Ensure Max and Prostat supplements ordered, both of which she is compliant with per [facility] documentation. Pt with poor oral intake and would benefit from nutrient dense supplement. One Ensure Enlive supplement provides 350 kcals, 20 grams protein, and 44-45 grams of carbohydrate vs one Ensure Max Protein supplement, which provides 150 kcals, 30 grams of protein, and 6 grams of carbohydrate.  RD will continue to monitor PO intake and acceptance and adjust supplement regimen as appropriate. Noted pt also has a history of DM with good control (Hgb A1c: 7.4 on 01/12/18); ADA's 2020 Standards of Diabetes Care recommends less stringent glycemic targets (Hgb A1c <8-8.5) for older adults with multiple co-morbidities, cognitive impairment, and/or decreased functional status. This RD agrees with regular diet to help promote adequate oral intake and prevent hypoglycemic episodes.   Reviewed wt hx; noted pt has experienced a 15% wt loss over the past month, which is significant for time frame. This is also very concerning in light of poor oral intake. Pt is at risk for malnutrition, however, unable to identify at this time without additional nutrition history and completion  of nutrition-focused physical exam.   Labs reviewed.   NUTRITION - FOCUSED PHYSICAL EXAM:    Most Recent Value  Orbital Region  Unable to assess  Upper Arm Region  Unable to assess  Thoracic and Lumbar  Region  Unable to assess  Buccal Region  Unable to assess  Temple Region  Unable to assess  Clavicle Bone Region  Unable to assess  Clavicle and Acromion Bone Region  Unable to assess  Scapular Bone Region  Unable to assess  Dorsal Hand  Unable to assess  Patellar Region  Unable to assess  Anterior Thigh Region  Unable to assess  Posterior Calf Region  Unable to assess  Edema (RD Assessment)  Unable to assess  Hair  Unable to assess  Eyes  Unable to assess  Mouth  Unable to assess  Skin  Unable to assess  Nails  Unable to assess       Diet Order:   Diet Order            Diet regular Room service appropriate? Yes with Assist; Fluid consistency: Thin  Diet effective now              EDUCATION NEEDS:   No education needs have been identified at this time  Skin:  Skin Assessment: Reviewed RN Assessment  Last BM:  10/17/18  Height:   Ht Readings from Last 1 Encounters:  10/12/18 5\' 8"  (1.727 m)    Weight:   Wt Readings from Last 1 Encounters:  10/19/18 64 kg    Ideal Body Weight:  63.6 kg  BMI:  Body mass index is 21.45 kg/m.  Estimated Nutritional Needs:   Kcal:  1700-1900  Protein:  90-105 grams  Fluid:  1.7-1.9 L    Sanjit Mcmichael A. Jimmye Norman, RD, LDN, [place] Registered Dietitian II Certified Diabetes Care and Education Specialist Pager: [phone] After hours Pager: [phone]

## 2018-10-19 NOTE — Evaluation (Signed)
Speech Language Pathology Assessment and Plan  Patient Details  Name: Margaret [name] MRN: [id number] Date of Birth: Nov 08, 1940   Evaluation Only   Today's Date: 10/19/2018 SLP Individual Time: 1200-1300 SLP Individual Time Calculation (min): 60 min   Problem List:  Patient Active Problem List   Diagnosis Date Noted  . Traumatic subdural hematoma ([place]) 10/12/2018  . Acute blood loss anemia   . Diabetes mellitus type 2 in nonobese (HCC)   . Malignant neoplasm of ovary ([place])   . PAF (paroxysmal atrial fibrillation) ([place])   . Chronic diastolic congestive heart failure ([place])   . Coronary artery disease involving native coronary artery of native heart without angina pectoris   . Subarachnoid hemorrhage ([place]) 10/08/2018  . Right ovarian cyst 08/12/2018  . Accelerated hypertension 07/31/2018  . Elevated lactic acid level 07/31/2018  . Hypertensive urgency 07/31/2018  . Goals of care, counseling/discussion 07/22/2018  . Primary high grade serous adenocarcinoma of ovary ([place]) 07/13/2018  . Controlled type 2 diabetes mellitus without complication, without long-term current use of insulin ([place]) 02/02/2018  . Bradycardia 01/13/2018  . Essential hypertension 11/04/2017  . Other hyperlipidemia 11/04/2017  . Hx of gastric bypass 11/04/2017  . NSTEMI (non-ST elevated myocardial infarction) ([place]) 06/14/2017  . History of ischemic right PCA stroke 03/27/2016  . Hemianopia, homonymous, left 01/31/2016  . Stroke ([place]) 11/06/2015  . Arthritis of knee, degenerative 05/04/2013  . Diastolic dysfunction 22/48/2500  . CAD S/P percutaneous coronary angioplasty   . Morbid obesity ([place])   . Right shoulder pain   . Anxiety and depression   . Dyspnea on exertion   . Cellulitis of right leg    Past Medical History:  Past Medical History:  Diagnosis Date  . Acetabular fracture ([place])   . Anxiety and depression   . Bartholin cyst   . CAD (coronary artery disease)   . Cancer ([place])    mouth cyst was  cancer many years ago   . Cellulitis of right leg 2005  . CHF (congestive heart failure) ([place])   . Colon polyp   . Complication of anesthesia   . Diabetes mellitus without complication ([place])   . DJD (degenerative joint disease)   . Dyspnea on exertion   . Dysrhythmia    PAF  . GERD (gastroesophageal reflux disease)   . Hyperlipidemia   . Hypertension   . Morbid obesity ([place])   . Myocardial infarction ([place]) 06/14/2017  . Ovarian cancer ([place])   . PAF (paroxysmal atrial fibrillation) ([place])    a. diagnosed 7/19; b. CHADS2VASc 8; c. Eliquis  . PONV (postoperative nausea and vomiting)   . Presence of stent in right coronary artery 02/08/12   2.5x18 Xience distal RCA  . Right shoulder pain   . Stroke ([place]) 2017  . Vaginal prolapse    Past Surgical History:  Past Surgical History:  Procedure Laterality Date  . ABDOMINAL HYSTERECTOMY     supracervical abdominal w/removal tubes &/or ovaries  . BREAST BIOPSY  1989  . CHOLECYSTECTOMY    . COLONOSCOPY    . COLONOSCOPY WITH PROPOFOL N/A 06/21/2018   Procedure: COLONOSCOPY WITH PROPOFOL;  Surgeon: Lollie Sails, MD;  Location: [facility] ENDOSCOPY;  Service: Endoscopy;  Laterality: N/A;  . CORONARY STENT INTERVENTION N/A 06/14/2017   Procedure: CORONARY STENT INTERVENTION;  Surgeon: Wellington Hampshire, MD;  Location: [place] CV LAB;  Service: Cardiovascular;  Laterality: N/A;  . ESOPHAGOGASTRODUODENOSCOPY N/A 06/21/2018   Procedure: ESOPHAGOGASTRODUODENOSCOPY (EGD);  Surgeon: Lollie Sails, MD;  Location: [facility] ENDOSCOPY;  Service: Endoscopy;  Laterality: N/A;  . FACIAL COSMETIC SURGERY     face/neck lift  . FRACTURE SURGERY    . GASTRIC BYPASS  2008  . JOINT REPLACEMENT    . KNEE SURGERY     left knee arthroscopic  . LAPAROSCOPIC BILATERAL SALPINGO OOPHERECTOMY Bilateral 06/24/2018   Procedure: LAPAROSCOPIC BILATERAL SALPINGO OOPHORECTOMY;  Surgeon: Schermerhorn, Gwen Her, MD;  Location: [facility] ORS;  Service: Gynecology;   Laterality: Bilateral;  . LEFT HEART CATH AND CORONARY ANGIOGRAPHY N/A 06/14/2017   Procedure: LEFT HEART CATH AND CORONARY ANGIOGRAPHY;  Surgeon: Wellington Hampshire, MD;  Location: [facility] CV LAB;  Service: Cardiovascular;  Laterality: N/A;  . LEFT HEART CATH AND CORONARY ANGIOGRAPHY N/A 06/14/2017   Procedure: LEFT HEART CATH AND CORONARY ANGIOGRAPHY;  Surgeon: Wellington Hampshire, MD;  Location: [place] CV LAB;  Service: Cardiovascular;  Laterality: N/A;  . LYSIS OF ADHESION  06/24/2018   Procedure: LYSIS OF ADHESION;  Surgeon: Ouida Sills Gwen Her, MD;  Location: [facility] ORS;  Service: Gynecology;;  . Crisoforo [place]   left hip fracture/repair and left arm fracture  . OTHER SURGICAL HISTORY  1986   hysterectomy  . PORTA CATH INSERTION N/A 07/26/2018   Procedure: PORTA CATH INSERTION;  Surgeon: Katha Cabal, MD;  Location: [place] CV LAB;  Service: Cardiovascular;  Laterality: N/A;  . right knee  late 90's   arthroscopic  . ROUX-EN-Y PROCEDURE    . SALIVARY GLAND SURGERY    . TOTAL HIP ARTHROPLASTY  2004   left    Assessment / Plan / Recommendation Clinical Impression Margaret [name] is a 78 year old right-handed female with history of CAD with stenting maintained on Plavix, diastolic congestive heart failure, diet-controlled diabetes mellitus, PAF maintained on Eliquis followed by Dr. Martinique as well as ovarian cancer maintained on chemotherapy.  Patient lives with spouse.  She presented on 10/08/2018 after a fall few days PTA with increased lethargy. Cranial CT scan reviewed, showing small bilateral subarachnoid hemorrhages, right greater than left.  Chronic ischemic changes including a large right PCA infarction.  Neurosurgery follow-up Dr. Vertell Limber recommended conservative care with follow-up head CT.  Follow-up head CT on 10/08/2018 reviewed, stable.  Recommendations per cardiology, to continue to hold anticoagulation.  Tolerating a regular diet.  Therapy evaluations completed with  recommendations of physical medicine rehab consult.   On 10/19/18, SLP received request for cognitive linguistic evaluation d/t concern for deficits that might be impeding progress within therapies. Per chart review, pt received speech-language evaluation while under services with daughter reporting baseline deficits. During the evaluation, pt presents with good verbal skills but poor tolerance to structured tasks. At times she appeared functional and during other tasks, pt's cognition appeared suppressed. This Probation officer contacted pt's daughter for further information on baseline to help with differential diagnosis of cognitive deficits. Pt's daughter provides that pt has self-isolated herself to the bed for last 3 to 4 years since her stroke. Daughter provides further information regarding self-limiting emotional characteristics of pt. Information shared with interdisciplinary team to aid increating POC. At this time, pt's cognitive function is commensurate with pt's recent history. Skilled ST is not indicated at this time.    Skilled Therapeutic Interventions          Skilled treatment session focused on completion of above mentioned deficits and education with pt on increasing participation in therapies as well as increasing intake.    SLP Assessment  Patient does not need any further Speech  [facility]              Pain    Prior Functioning Cognitive/Linguistic Baseline: (see impressions statement in evaluation) Type of Home: House  Lives With: Spouse Available Help at Discharge: Family;Available 24 hours/day Vocation: Retired  Industrial/product designer Term Goals: No short term Landscape architect to [place] for [place]  Recommendations for other services: Neuropsych  Discharge Criteria: Patient will be discharged from SLP if patient refuses treatment 3 consecutive times without medical reason, if treatment goals not met, if there is a change in medical status, if patient makes no  progress towards goals or if patient is discharged from hospital.  The above assessment, treatment plan, treatment alternatives and goals were discussed and mutually agreed upon: by patient and by family  Chandler Swiderski 10/19/2018, 1:39 PM

## 2018-10-19 NOTE — Progress Notes (Signed)
Patient slept well throughout the night. No complaints verbalized. Urine specimen sent for U/A and culture.

## 2018-10-19 NOTE — Plan of Care (Signed)
  Problem: Consults Goal: RH BRAIN INJURY PATIENT EDUCATION Description Description: See Patient Education module for eduction specifics Outcome: Progressing   Problem: RH SKIN INTEGRITY Goal: RH STG SKIN FREE OF INFECTION/BREAKDOWN Description No new breakdown with min assist/cues   Outcome: Progressing   Problem: RH SAFETY Goal: RH STG ADHERE TO SAFETY PRECAUTIONS W/ASSISTANCE/DEVICE Description STG Adhere to Safety Precautions With Min Assistance/Device.  Outcome: Progressing   Problem: RH PAIN MANAGEMENT Goal: RH STG PAIN MANAGED AT OR BELOW PT'S PAIN GOAL Description < 3 out of 10.   Outcome: Progressing

## 2018-10-19 NOTE — Progress Notes (Signed)
Occupational Therapy Weekly Progress Note  Patient Details  Name: Margaret [name] MRN: [id number] Date of Birth: 08-Nov-1940  Beginning of progress report period: October 13, 2018 End of progress report period: October 19, 2018  Today's Date: 10/19/2018 OT Individual Time:  1015- 1110   55 min 18 min missed d/t pain/fatigue  Pt has made limited, slow progress within in OT POC this reporting period. Pt has made improvements in bathing/dressing tasks and is able to don LB clothing with CGA. Pt has been limited by hypotension and general fatigue/reduced activity tolerance. [name] will be coming in for family education to determine discharge plans.   Patient continues to demonstrate the following deficits: muscle weakness, decreased cardiorespiratoy endurance, decreased initiation, decreased safety awareness and decreased memory and decreased standing balance and decreased balance strategies and therefore will continue to benefit from skilled OT intervention to enhance overall performance with BADL.  Patient progressing toward long term goals.  Continue plan of care. LTG will remain at (S) and reviewed following family education session to reflect family and pt goals/available assist.   OT Short Term Goals Week 1:  OT Short Term Goal 1 (Week 1): n/a d/t ELOS Week 2:  OT Short Term Goal 1 (Week 2): Continue working toward established LTG  Skilled Therapeutic Interventions/Progress Updates:    Care coordination completed with clarifying shower orders with central line. Pt states her oncologist required no covering at home for line and she has been showering as usual. Pt not "up for" attempting shower this session. BP supine 129/74 with pt reporting feeling "off". Pt transitioned to EOB with CGA, verbalizing. Pt c/o dizziness and crying EOB unable to really describe how she is feeling. BP checked and stable- 111/65 with teds and abdominal binder on. Pt able to complete UB bathing with moderate cueing,  CGA for sitting balance. Pt frequently returning to supine to rest. Upon sitting, pt now c/o nausea and dizzyness, BP still stable at 111/74.  Pt able to don/doff pants sit <> stand with CGA. Pt returned to supine and now crying, stated she could not continue with remainder of session. Pt left with all needs met, bed alarm set. Reinforced need for pt to increase PO intake.   Therapy Documentation Precautions:  Precautions Precautions: Fall Precaution Comments: low frustration tolerance  Restrictions Weight Bearing Restrictions: No   Therapy/Group: Individual Therapy  Curtis Sites 10/19/2018, 7:15 AM

## 2018-10-19 NOTE — Progress Notes (Signed)
[facility] PHYSICAL MEDICINE & REHABILITATION PROGRESS NOTE   Subjective/Complaints: Up in bed. Breakfast in front of her. Ate a piece of bacon. States she's still not that hungry.   ROS: Patient denies fever, rash, sore throat, blurred vision, nausea, vomiting, diarrhea, cough, shortness of breath or chest pain, joint or back pain, headache, or mood change.   Objective:   No results found. Recent Labs    10/17/18 1800 10/18/18 1806  WBC 10.7* 8.5  HGB 9.9* 9.9*  HCT 30.2* 30.5*  PLT 247 265   Recent Labs    10/18/18 1806  NA 143  K 4.0  CL 111  CO2 23  GLUCOSE 90  BUN 27*  CREATININE 0.87  CALCIUM 9.4    Intake/Output Summary (Last 24 hours) at 10/19/2018 0838 Last data filed at 10/19/2018 0600 Gross per 24 hour  Intake 480 ml  Output 400 ml  Net 80 ml     Physical Exam: Vital Signs Blood pressure 109/70, pulse 83, temperature 97.8 F (36.6 C), temperature source Oral, resp. rate 19, height 5\' 8"  (1.727 m), weight 64 kg, SpO2 99 %. Constitutional: No distress . Vital signs reviewed. HEENT: EOMI, oral membranes moist Neck: supple Cardiovascular: RRR without murmur. No JVD    Respiratory: CTA Bilaterally without wheezes or rales. Normal effort    GI: BS +, non-tender, non-distended  Musculoskeletal:     Comments: tenderness at coccyx  Noted.  Neurological: alert and oriented. Fair insight and awareness.  Motor: remainsGrossly 4+/5 throughout--moves all 4  Skin: Skin is warm and dry.  Scattered bruising Psychiatric: pleasant, no delusional or paranoid behavior   Assessment/Plan: 1. Functional deficits secondary to traumatic SAH which require 3+ hours per day of interdisciplinary therapy in a comprehensive inpatient rehab setting.  Physiatrist is providing close team supervision and 24 hour management of active medical problems listed below.  Physiatrist and rehab team continue to assess barriers to discharge/monitor patient progress toward functional and  medical goals  Care Tool:  Bathing  Bathing activity did not occur: Refused Body parts bathed by patient: Right arm, Left arm, Chest, Abdomen, Front perineal area, Buttocks, Right upper leg, Left upper leg, Right lower leg, Left lower leg, Face         Bathing assist Assist Level: Minimal Assistance - Patient > 75%     Upper Body Dressing/Undressing Upper body dressing   What is the patient wearing?: Pull over shirt    Upper body assist Assist Level: Supervision/Verbal cueing    Lower Body Dressing/Undressing Lower body dressing      What is the patient wearing?: Underwear/pull up, Pants     Lower body assist Assist for lower body dressing: Contact Guard/Touching assist     Toileting Toileting    Toileting assist Assist for toileting: Contact Guard/Touching assist     Transfers Chair/bed transfer  Transfers assist     Chair/bed transfer assist level: Minimal Assistance - Patient > 75%     Locomotion Ambulation   Ambulation assist      Assist level: Contact Guard/Touching assist Assistive device: Walker-rolling Max distance: 30 ft    Walk 10 feet activity   Assist     Assist level: Contact Guard/Touching assist Assistive device: Walker-rolling   Walk 50 feet activity   Assist    Assist level: Contact Guard/Touching assist Assistive device: Walker-rolling    Walk 150 feet activity   Assist Walk 150 feet activity did not occur: Safety/medical concerns(fatigue )  Walk 10 feet on uneven surface  activity   Assist     Assist level: Minimal Assistance - Patient > 75% Assistive device: Aeronautical engineer Will patient use wheelchair at discharge?: No Type of Wheelchair: Manual    Wheelchair assist level: Supervision/Verbal cueing Max wheelchair distance: 65 ft    Wheelchair 50 feet with 2 turns activity    Assist        Assist Level: Supervision/Verbal cueing   Wheelchair 150 feet  activity     Assist     Assist Level: Supervision/Verbal cueing    Medical Problem List and Plan: 1.  Decreased functional mobility secondary to traumatic SAH left sylvian fissure, left temporal, right frontal             -Continue CIR therapies including PT, OT, and SLP     -struggling with stamina, participation. Demonstrating intermittent confusion and delusions at times as well. Fairly clear this morning  -will need to establish what level of care family can provide  -?palliative care consult 2.  Antithrombotics: -DVT/anticoagulatio SCn:Ds             -antiplatelet therapy: Chronic Plavix and Eliquis held due to [facility] 3. Pain Management: Hydrocodone and Flexeril as needed  -ice to coccyx  4. Mood: Zoloft 100 mg daily             -antipsychotic agents: N/A 5. Neuropsych: This patient is capable of making decisions on her own behalf. 6. Skin/Wound Care: Routine skin checks 7. Fluids/Electrolytes/Nutrition:  -encourage po  -protein supp for low albumin,   -prealbumin low  -BUN elevated---begin 1/2ns 75cc/hr IVF  -initiated megace for appetite  -will as RD to see as well 8.  Hypertension.  Lopressor 25 mg twice daily, Cozaar 100 mg daily, Norvasc 2.5 mg daily had been held due to hypotension---pressures dropping in therapy  Vitals:   10/18/18 1957 10/19/18 0609  BP: (!) 148/61 109/70  Pulse: 65 83  Resp: 18 19  Temp: 98 F (36.7 C) 97.8 F (36.6 C)  SpO2: 99%   --TEDS/abd binders  -consider midodrine trial -hydrate as above 9.  CAD with stenting.  Plavix currently on hold. 10.  Diastolic congestive heart failure.     -weights up today. ?accuracy Filed Weights   10/17/18 0410 10/18/18 0507 10/19/18 0609  Weight: 64.6 kg 66 kg 64 kg   . 11.  PAF.  Cardiac rate controlled on Lopressor.  Eliquis on hold.    12.  Ovarian cancer.  Follow-up outpatient.chemotherapy currently on hold until rehabilitation completed for [facility] 13.  Diet-controlled diabetes mellitus.  Patient  currently on a regular diet.  No need to check CBGs on a regular basis   14.  Hyperlipidemia.  Zetia/Lipitor 15.  Anemia: mild iron deficiency, more related to chronic disease  Continue iron supplement for now   - hgb drifting down from 13.3 on 2/17 to 9.5 most recently --up to 9.9 4/27   -stool heme negative  -no signs of gross blood loss        LOS: 7 days A FACE TO FACE EVALUATION WAS PERFORMED  Meredith Staggers 10/19/2018, 8:38 AM

## 2018-10-19 NOTE — Progress Notes (Signed)
Physical Therapy Session Note  Patient Details  Name: Margaret [name] MRN: [id number] Date of Birth: [birth year]  Today's Date: 10/19/2018 PT Individual Time: 1445-1515 PT Individual Time Calculation (min): 30 min   Short Term Goals: Week 1:  PT Short Term Goal 1 (Week 1): STGs = LTGs due to ELOS  Week 2:  PT Short Term Goal 1 (Week 2): STGs = LTGs due to ELOS  Week 3:     Skilled Therapeutic Interventions/Progress Updates:     Patient in w/c upon PT arrival. Patient alert and agreeable to PT session.  Therapeutic Activity: Bed Mobility: Patient performed sit to supine with supervision with bed flat and without use of bed rail. Transfers: Patient performed sit to/from stand x2 with CGA for safety using a RW. Provided verbal cues for pushing up form the w/c and reaching back before sitting. Patient reported feeling light headed in standing during both trials. Attempted to take BP in standing during second trial, but patient too fatigued and sat while BP was being taken. BP sitting 99/63 and BP after standing 90/65. Patient performed a squat pivot transfer from the w/c to the bed x1 with min A without an AD. Patient reported feeling very fatigued after mobility  Patient in bed at end of session with breaks locked, bed alarm set, and all needs within reach. Educated patient on preventing falls at home, recommended having a cell phone in her pocket at all times in case of a fall, calling 911 to assist if she falls at home, removing tripping hazards and have lights on in the hallway or bathroom at night to prevent falls.    Therapy Documentation Precautions:  Precautions Precautions: Fall Precaution Comments: low frustration tolerance  Restrictions Weight Bearing Restrictions: No Pain: Patient denied pain throughout session.   Therapy/Group: Individual Therapy  Doreene Burke, PT, DPT  10/19/2018, 4:10 PM

## 2018-10-19 NOTE — Progress Notes (Signed)
Physical Therapy Session Note  Patient Details  Name: Margaret [name] MRN: [id number] Date of Birth: August 02, 1940  Today's Date: 10/19/2018 PT Individual Time: 3016-0109 PT Individual Time Calculation (min): 21 min   Short Term Goals: Week 1:  PT Short Term Goal 1 (Week 1): STGs = LTGs due to ELOS   Skilled Therapeutic Interventions/Progress Updates:    Patient in supine reports finished breakfast.  C/o fatigue due to waking up so early.  Reports MD to give some fluids.  Supine to sit min A and donned shoes and binder total A in sitting.  Patient moaning throughout reports neuropathy back and painful in feet.  BP measurements as below.  Pt crying and requesting to lie down.  Assist to doff shoes and binder and pt returned to supine with S.  Missed 8 minutes of PT and pt left with call bell in reach and bed alarm on.    Therapy Documentation Precautions:  Precautions Precautions: Fall Precaution Comments: low frustration tolerance  Restrictions Weight Bearing Restrictions: No General: PT Amount of Missed Time (min): 8 Minutes PT Missed Treatment Reason: Patient fatigue;Pain Vital Signs: Therapy Vitals Temp: 97.8 F (36.6 C) Temp Source: Oral Pulse Rate: 83 Resp: 19 BP: 109/70 Patient Position (if appropriate): Sitting Pain: Pain Assessment Pain Type: Acute pain Pain Location: Buttocks Pain Orientation: Mid Pain Descriptors / Indicators: Aching Pain Onset: With Activity Pain Intervention(s): Repositioned;Rest Multiple Pain Sites: Yes 2nd Pain Site Wong-Baker Pain Rating: Hurts whole lot Pain Type: Neuropathic pain Pain Location: Foot Pain Orientation: Right;Left Pain Descriptors / Indicators: Burning Pain Onset: On-going Pain Intervention(s): RN made aware;Rest    Therapy/Group: Individual Therapy  Reginia Naas  Magda Kiel, PT 10/19/2018, 8:57 AM

## 2018-10-20 ENCOUNTER — Inpatient Hospital Stay (HOSPITAL_COMMUNITY): Payer: PPO

## 2018-10-20 ENCOUNTER — Inpatient Hospital Stay (HOSPITAL_COMMUNITY): Payer: PPO | Admitting: Physical Therapy

## 2018-10-20 LAB — URINE CULTURE: Culture: 80000 — AB

## 2018-10-20 MED ORDER — SODIUM CHLORIDE 0.45 % IV SOLN
INTRAVENOUS | Status: DC
  Administered 2018-10-20 – 2018-10-21 (×2): via INTRAVENOUS

## 2018-10-20 NOTE — Progress Notes (Signed)
Occupational Therapy Session Note  Patient Details  Name: Margaret [name] MRN: [id number] Date of Birth: 12-18-1940  Today's Date: 10/20/2018 OT Individual Time: 4174-0814 OT Individual Time Calculation (min): 73 min    Short Term Goals: Week 2:  OT Short Term Goal 1 (Week 2): Continue working toward established LTG  Skilled Therapeutic Interventions/Progress Updates:    1;1. Upon entering pt completing phone call with husband. Pt reporting need to toilet. OT dons teds, pt dons grip socks. Pt completes stand pivot transfers with grab bar/bed rail with CGA-S. Pt completes toileting with VC for pull ing up pants prior to sitting after toileting. Toileting with CGA for steadying during standing clothing management.  Pt completes hand washing and oral care at sink in sitting for energy conservation. BP assessed seated after grooming: 98/71. Tightened abdominal binder. Pt practices shower transfer using posterior method 2x after OT demo. Pt unsuccessful at stepping BLE over edge and requires seated rest break d/t anxiety. After break and pep talk, pt able to complete with CGA. Pt BP reassessed 81/50. OT returned pt to supine for UB exercise circuit with 2# dowel rod 2x15 each exercise for global strengthening with demo cuing. BP assessed in supine  With abdominal binder off and teds donned after exercise as: 117/62.  Exited session with pt seated in bed, exit alarm on and call light in reach  Therapy Documentation Precautions:  Precautions Precautions: Fall Precaution Comments: low frustration tolerance  Restrictions Weight Bearing Restrictions: No General:   Vital Signs:   Pain: Pain Assessment Pain Scale: Faces Faces Pain Scale: Hurts even more Pain Type: Acute pain Pain Location: Buttocks Pain Descriptors / Indicators: Aching;Moaning Pain Frequency: Intermittent Pain Onset: On-going Patients Stated Pain Goal: 3 Pain Intervention(s): Medication (See eMAR);Repositioned;Emotional  support ADL: ADL Upper Body Dressing: Supervision/safety Lower Body Dressing: Moderate assistance, Moderate cueing Where Assessed-Lower Body Dressing: Edge of bed Toileting: Moderate assistance Where Assessed-Toileting: Bedside Commode Toilet Transfer: Minimal assistance Toilet Transfer Method: Stand pivot Toilet Transfer Equipment: Bedside commode Vision   Perception    Praxis   Exercises:   Other Treatments:     Therapy/Group: Individual Therapy  Tonny Branch 10/20/2018, 9:34 AM

## 2018-10-20 NOTE — Progress Notes (Signed)
Slept well throughout the night.Tolerated IVF without difficulty. No complaints verbalized. Pt noted with mild anxiety this morning during toileting session and CHG bath. Pt able to be calmed with one on one interaction with staff.

## 2018-10-20 NOTE — Plan of Care (Signed)
  Problem: RH SKIN INTEGRITY Goal: RH STG SKIN FREE OF INFECTION/BREAKDOWN Description No new breakdown with min assist/cue Outcome: Progressing  Continue to assess skin per shift.  Problem: RH SAFETY Goal: RH STG ADHERE TO SAFETY PRECAUTIONS W/ASSISTANCE/DEVICE Description STG Adhere to Safety Precautions With Min Assistance/Device.  Outcome: Progressing  Bed alarm 7p-7a shift call light within reach  Problem: RH PAIN MANAGEMENT Goal: RH STG PAIN MANAGED AT OR BELOW PT'S PAIN GOAL Description < 3 out of 10.   Outcome: Progressing  Assess pain, denies pain

## 2018-10-20 NOTE — Progress Notes (Signed)
Physical Therapy Session Note  Patient Details  Name: Andreya Lacks MRN: [id number] Date of Birth: 1940/08/15  Today's Date: 10/20/2018 PT Individual Time: 1130-1156 PT Individual Time Calculation (min): 26 min   Short Term Goals: Week 1:  PT Short Term Goal 1 (Week 1): STGs = LTGs due to ELOS   Skilled Therapeutic Interventions/Progress Updates:    pt in bed agreeable to therapy.  Pt performs gait with RW and CGA 3 x 25' with 1-2 minutes rest between sets.  No c/o dizziness with sitting and standing.  Seated therex 3 x 10 LAQ, hip abd/add, ankle pumps.  Pt then c/o fatigue and requests to lay back down. Bed mobility with supervision. Pt left in bed with needs at hand, alarm set.  Therapy Documentation Precautions:  Precautions Precautions: Fall Precaution Comments: low frustration tolerance  Restrictions Weight Bearing Restrictions: No General: PT Amount of Missed Time (min): 30 Minutes PT Missed Treatment Reason: Patient fatigue Pain: No c/o pain during session   Therapy/Group: Individual Therapy  Cornell Bourbon 10/20/2018, 11:58 AM

## 2018-10-20 NOTE — Progress Notes (Signed)
[place] PHYSICAL MEDICINE & REHABILITATION PROGRESS NOTE   Subjective/Complaints: No new issues. Up at eob. No new complaints. Denies pain  ROS: Patient denies fever, rash, sore throat, blurred vision, nausea, vomiting, diarrhea, cough, shortness of breath or chest pain, joint or back pain, headache, or mood change.   Objective:   No results found. Recent Labs    10/17/18 1800 10/18/18 1806  WBC 10.7* 8.5  HGB 9.9* 9.9*  HCT 30.2* 30.5*  PLT 247 265   Recent Labs    10/18/18 1806  NA 143  K 4.0  CL 111  CO2 23  GLUCOSE 90  BUN 27*  CREATININE 0.87  CALCIUM 9.4    Intake/Output Summary (Last 24 hours) at 10/20/2018 0838 Last data filed at 10/20/2018 0513 Gross per 24 hour  Intake 680 ml  Output 100 ml  Net 580 ml     Physical Exam: Vital Signs Blood pressure 122/62, pulse 73, temperature 98.4 F (36.9 C), temperature source Oral, resp. rate 18, height 5\' 8"  (1.727 m), weight 65 kg, SpO2 98 %. Constitutional: No distress . Vital signs reviewed. HEENT: EOMI, oral membranes moist Neck: supple Cardiovascular: RRR without murmur. No JVD    Respiratory: CTA Bilaterally without wheezes or rales. Normal effort    GI: BS +, non-tender, non-distended  Musculoskeletal:     Comments: .  Neurological: alert and oriented. Fair insight and awareness.  Motor: remainsGrossly 4+/5 throughout--moves all 4  Skin: Skin is warm and dry.  Scattered bruising Psychiatric: pleasant and cooperative. No agitated or delusional behaviors seen   Assessment/Plan: 1. Functional deficits secondary to traumatic SAH which require 3+ hours per day of interdisciplinary therapy in a comprehensive inpatient rehab setting.  Physiatrist is providing close team supervision and 24 hour management of active medical problems listed below.  Physiatrist and rehab team continue to assess barriers to discharge/monitor patient progress toward functional and medical goals  Care Tool:  Bathing  Bathing activity did not occur: Refused Body parts bathed by patient: Right arm, Left arm, Chest, Abdomen, Front perineal area, Buttocks, Right upper leg, Left upper leg, Right lower leg, Left lower leg, Face         Bathing assist Assist Level: Minimal Assistance - Patient > 75%     Upper Body Dressing/Undressing Upper body dressing   What is the patient wearing?: Pull over shirt    Upper body assist Assist Level: Supervision/Verbal cueing    Lower Body Dressing/Undressing Lower body dressing      What is the patient wearing?: Underwear/pull up, Pants     Lower body assist Assist for lower body dressing: Contact Guard/Touching assist     Toileting Toileting    Toileting assist Assist for toileting: Contact Guard/Touching assist     Transfers Chair/bed transfer  Transfers assist     Chair/bed transfer assist level: Minimal Assistance - Patient > 75%     Locomotion Ambulation   Ambulation assist      Assist level: Contact Guard/Touching assist Assistive device: Walker-rolling Max distance: 30 ft    Walk 10 feet activity   Assist     Assist level: Contact Guard/Touching assist Assistive device: Walker-rolling   Walk 50 feet activity   Assist    Assist level: Contact Guard/Touching assist Assistive device: Walker-rolling    Walk 150 feet activity   Assist Walk 150 feet activity did not occur: Safety/medical concerns(fatigue )         Walk 10 feet on uneven surface  activity  Assist     Assist level: Minimal Assistance - Patient > 75% Assistive device: Aeronautical engineer Will patient use wheelchair at discharge?: No Type of Wheelchair: Manual    Wheelchair assist level: Supervision/Verbal cueing Max wheelchair distance: 65 ft    Wheelchair 50 feet with 2 turns activity    Assist        Assist Level: Supervision/Verbal cueing   Wheelchair 150 feet activity     Assist     Assist  Level: Supervision/Verbal cueing    Medical Problem List and Plan: 1.  Decreased functional mobility secondary to traumatic SAH left sylvian fissure, left temporal, right frontal             -Continue CIR therapies including PT, OT, and SLP     -activity tolerance wax/wane  -some manipulative personality and cognitive issues also  -appears to have a complicated psychosocial situation by report 2.  Antithrombotics: -DVT/anticoagulatio SCn:Ds             -antiplatelet therapy: Chronic Plavix and Eliquis held due to [facility] 3. Pain Management: Hydrocodone and Flexeril as needed  -ice to coccyx  4. Mood: Zoloft 100 mg daily             -antipsychotic agents: N/A 5. Neuropsych: This patient is capable of making decisions on her own behalf. 6. Skin/Wound Care: Routine skin checks 7. Fluids/Electrolytes/Nutrition:  -continue encourage PO, limited intake yesterday  -protein supp for low albumin,   -prealbumin 13.2  -BUN elevated---continue 1/2ns, reduced to 50cc/hr IVF at [facility]  - megace for appetite  -appreciate RD assistance 8.  Hypertension.  Lopressor 25 mg twice daily, Cozaar 100 mg daily, Norvasc 2.5 mg daily had been held due to hypotension---pressures dropping in therapy  Vitals:   10/19/18 2012 10/20/18 0517  BP: 117/62 122/62  Pulse: 72 73  Resp: 18 18  Temp: 98.9 F (37.2 C) 98.4 F (36.9 C)  SpO2: 99% 98%  --TEDS/abd binders  -consider midodrine trial -hydrate as above 9.  CAD with stenting.  Plavix currently on hold. 10.  Diastolic congestive heart failure.     -weights appear stable today Filed Weights   10/19/18 0609 10/19/18 1311 10/20/18 0517  Weight: 64 kg 64.4 kg 65 kg   .  11.  PAF.  Cardiac rate controlled on Lopressor.  Eliquis on hold.    12.  Ovarian cancer.  Follow-up outpatient.chemotherapy currently on hold until rehabilitation completed for [facility] 13.  Diet-controlled diabetes mellitus.  Patient currently on a regular diet.   No need to check CBGs on a  regular basis   14.  Hyperlipidemia.  Zetia/Lipitor 15.  Anemia: mild iron deficiency, more related to chronic disease  Continue iron supplement for now   - hgb drifting down from 13.3 on 2/17 to 9.5 most recently --up to 9.9 4/28   -stool heme negative  -no signs of gross blood loss        LOS: 8 days A FACE TO FACE EVALUATION WAS PERFORMED  Meredith Staggers 10/20/2018, 8:38 AM

## 2018-10-21 ENCOUNTER — Encounter (HOSPITAL_COMMUNITY): Payer: PPO

## 2018-10-21 ENCOUNTER — Ambulatory Visit (HOSPITAL_COMMUNITY): Payer: PPO | Admitting: Physical Therapy

## 2018-10-21 ENCOUNTER — Inpatient Hospital Stay (HOSPITAL_COMMUNITY): Payer: PPO | Admitting: Occupational Therapy

## 2018-10-21 ENCOUNTER — Inpatient Hospital Stay (HOSPITAL_COMMUNITY): Payer: PPO

## 2018-10-21 ENCOUNTER — Inpatient Hospital Stay (HOSPITAL_COMMUNITY): Payer: PPO | Admitting: Physical Therapy

## 2018-10-21 LAB — BASIC METABOLIC PANEL
Anion gap: 9 (ref 5–15)
BUN: 24 mg/dL — ABNORMAL HIGH (ref 8–23)
CO2: 22 mmol/L (ref 22–32)
Calcium: 9.1 mg/dL (ref 8.9–10.3)
Chloride: 112 mmol/L — ABNORMAL HIGH (ref 98–111)
Creatinine, Ser: 0.81 mg/dL (ref 0.44–1.00)
GFR calc Af Amer: >60 mL/min (ref >60)
GFR calc non Af Amer: >60 mL/min (ref >60)
Glucose, Bld: 94 mg/dL (ref 70–99)
Potassium: 3.4 mmol/L — ABNORMAL LOW (ref 3.5–5.1)
Sodium: 143 mmol/L (ref 135–145)

## 2018-10-21 NOTE — Progress Notes (Signed)
Occupational Therapy Weekly Progress Note  Patient Details  Name: Kaleia Longhi MRN: [id number] Date of Birth: [birth year]  Beginning of progress report period: October 14, 2018 End of progress report period: Oct 21, 2018  Today's Date: 10/21/2018 OT Individual Time: 2633-3545 OT Individual Time Calculation (min): 57 min    STGs set for LTG during reporting period d/t LOS. Pt met UB dressing goal and is progressing towards supervision level goals.  Pt has made slow progress this reporting period transitioning to CGA with mobility/dressing/bathing inside ADLs. Pt continues to be limited by behavior, pain and orthostasis despite application of teds and abdominal binder. Husband present for family ed this date see below for details. Family insistant they can provide S-CGA for pt at home and anxious for pt to discharge.  Patient continues to demonstrate the following deficits: muscle weakness, decreased cardiorespiratoy endurance, decreased coordination and decreased motor planning, decreased awareness, decreased problem solving, decreased safety awareness, decreased memory and delayed processing and decreased sitting balance, decreased standing balance, decreased postural control and decreased balance strategies and therefore will continue to benefit from skilled OT intervention to enhance overall performance with BADL.  Patient progressing toward long term goals..  Continue plan of care.  OT Short Term Goals Week 1:  OT Short Term Goal 1 (Week 1): n/a d/t ELOS  Skilled Therapeutic Interventions/Progress Updates:    1:1. Pt, daughter and husband present for session of family education. Husband reporting walk in shower with level entry, grab bars, door, and built in corner shower seat. Husband is reluctant to purchase shower chair despite benefits of having elevated surface and larger seat for better balance. Daughter will send picture to pt of shower set up. OT still recommending shower seat at this  time. Pt and daughter practice commode transfer, however husband declines practicing and seems surprised he will need to be next to pt for all ADL tasks including bathing, dressing and toileting d/t unsteadiness. OT educates on proper supervision, using hips as point of control if pt loses balance, RW management, safety awareness, locking brakes on w/c prior to sit<>stand, and body positioning when providing A. OT demo trick to ease donning of ted hose using plastic bag and reiterated importance of wearing binder/teds to keep BP up. Pt given theraband HEP and OT allows pt time to attempt to complete an exercise based on written instructions/pictures as planned failure to demonstrate to husband decreased problem solving and need for mod-max cuing. Husband reports he will be able to cue her because "he was a swim coach." Exited session after daughter escorts pt back to room. OT turns on exit alarm and hands pt call light. No further questions from family  Therapy Documentation Precautions:  Precautions Precautions: Fall Precaution Comments: low frustration tolerance  Restrictions Weight Bearing Restrictions: No General:   Vital Signs: Therapy Vitals Temp: 97.9 F (36.6 C) Temp Source: Oral Pulse Rate: 62 Resp: 12 BP: (!) 115/49(rn notified ) Patient Position (if appropriate): Lying Oxygen Therapy SpO2: 99 % O2 Device: Room Air Pain:   ADL: ADL Upper Body Dressing: Supervision/safety Lower Body Dressing: Moderate assistance, Moderate cueing Where Assessed-Lower Body Dressing: Edge of bed Toileting: Moderate assistance Where Assessed-Toileting: Bedside Commode Toilet Transfer: Minimal assistance Toilet Transfer Method: Stand pivot Toilet Transfer Equipment: Bedside commode Vision   Perception    Praxis   Exercises:   Other Treatments:     Therapy/Group: Individual Therapy  Tonny Branch 10/21/2018, 6:59 AM

## 2018-10-21 NOTE — Progress Notes (Signed)
CHG bath completed  

## 2018-10-21 NOTE — Progress Notes (Signed)
[place] PHYSICAL MEDICINE & REHABILITATION PROGRESS NOTE   Subjective/Complaints: No new complaints. Denies any pain. Appeared to have slept fairly well  ROS: Limited due to cognitive/behavioral .   Objective:   No results found. Recent Labs    10/18/18 1806  WBC 8.5  HGB 9.9*  HCT 30.5*  PLT 265   Recent Labs    10/18/18 1806 10/21/18 0454  NA 143 143  K 4.0 3.4*  CL 111 112*  CO2 23 22  GLUCOSE 90 94  BUN 27* 24*  CREATININE 0.87 0.81  CALCIUM 9.4 9.1    Intake/Output Summary (Last 24 hours) at 10/21/2018 0914 Last data filed at 10/21/2018 0300 Gross per 24 hour  Intake 445.34 ml  Output -  Net 445.34 ml     Physical Exam: Vital Signs Blood pressure (!) 115/49, pulse 62, temperature 97.9 F (36.6 C), temperature source Oral, resp. rate 12, height 5\' 8"  (1.727 m), weight 66.6 kg, SpO2 99 %. Constitutional: No distress . Vital signs reviewed. HEENT: EOMI, oral membranes moist Neck: supple Cardiovascular: RRR without murmur. No JVD    Respiratory: CTA Bilaterally without wheezes or rales. Normal effort    GI: BS +, non-tender, non-distended  Musculoskeletal:     Comments: .  Neurological: alert and oriented. Fair insight and awareness.  Motor: remainsGrossly 4+/5 in all 4's Skin: Skin is warm and dry.  Scattered bruising Psychiatric: pleasant and cooperative. No agitated or delusional behaviors seen   Assessment/Plan: 1. Functional deficits secondary to traumatic SAH which require 3+ hours per day of interdisciplinary therapy in a comprehensive inpatient rehab setting.  Physiatrist is providing close team supervision and 24 hour management of active medical problems listed below.  Physiatrist and rehab team continue to assess barriers to discharge/monitor patient progress toward functional and medical goals  Care Tool:  Bathing  Bathing activity did not occur: Refused Body parts bathed by patient: Right arm, Left arm, Chest, Abdomen, Front perineal  area, Buttocks, Right upper leg, Left upper leg, Right lower leg, Left lower leg, Face         Bathing assist Assist Level: Minimal Assistance - Patient > 75%     Upper Body Dressing/Undressing Upper body dressing   What is the patient wearing?: Pull over shirt    Upper body assist Assist Level: Supervision/Verbal cueing    Lower Body Dressing/Undressing Lower body dressing      What is the patient wearing?: Underwear/pull up, Pants     Lower body assist Assist for lower body dressing: Contact Guard/Touching assist     Toileting Toileting    Toileting assist Assist for toileting: Contact Guard/Touching assist     Transfers Chair/bed transfer  Transfers assist     Chair/bed transfer assist level: Minimal Assistance - Patient > 75%     Locomotion Ambulation   Ambulation assist      Assist level: Contact Guard/Touching assist Assistive device: Walker-rolling Max distance: 30 ft    Walk 10 feet activity   Assist     Assist level: Contact Guard/Touching assist Assistive device: Walker-rolling   Walk 50 feet activity   Assist    Assist level: Contact Guard/Touching assist Assistive device: Walker-rolling    Walk 150 feet activity   Assist Walk 150 feet activity did not occur: Safety/medical concerns(fatigue )         Walk 10 feet on uneven surface  activity   Assist     Assist level: Minimal Assistance - Patient > 75% Assistive device:  Walker-rolling   Wheelchair     Assist Will patient use wheelchair at discharge?: No Type of Wheelchair: Agricultural engineer assist level: Supervision/Verbal cueing Max wheelchair distance: 65 ft    Wheelchair 50 feet with 2 turns activity    Assist        Assist Level: Supervision/Verbal cueing   Wheelchair 150 feet activity     Assist     Assist Level: Supervision/Verbal cueing    Medical Problem List and Plan: 1.  Decreased functional mobility secondary to  traumatic SAH left sylvian fissure, left temporal, right frontal             -Continue CIR therapies including PT, OT, and SLP     -activity tolerance waxes/wanes  -some manipulative personality and cognitive issues also  -appears to have a complicated psychosocial situation by report  -family to be in for education   -?consider palliative care consult? 2.  Antithrombotics: -DVT/anticoagulatio SCn:Ds             -antiplatelet therapy: Chronic Plavix and Eliquis held due to [facility] 3. Pain Management: Hydrocodone and Flexeril as needed  -ice to coccyx  4. Mood: Zoloft 100 mg daily             -antipsychotic agents: N/A 5. Neuropsych: This patient is capable of making decisions on her own behalf. 6. Skin/Wound Care: Routine skin checks 7. Fluids/Electrolytes/Nutrition:  -continue encourage PO, limited intake yesterday  -protein supp for low albumin,   -prealbumin 13.2  -BUN improved sl to 24  5/1  -continue 1/2ns at 50cc/hr IVF at [facility]  - megace for appetite  -appreciate RD assistance  -pt ate next to nothing yesterday 8.  Hypertension.  Lopressor 25 mg twice daily, Cozaar 100 mg daily, Norvasc 2.5 mg daily had been held due to hypotension---pressures dropping in therapy  Vitals:   10/20/18 2041 10/21/18 0446  BP: 132/60 (!) 115/49  Pulse: 62 62  Resp: 18 12  Temp: 98 F (36.7 C) 97.9 F (36.6 C)  SpO2: 99% 99%  --TEDS/abd binders  -consider midodrine trial if she remains symptomatic -hydration as above 9.  CAD with stenting.  Plavix currently on hold. 10.  Diastolic congestive heart failure.     -weights have typically been between 64-66kg, no signs of fluid overload  -continue IVF as she's taking in little Filed Weights   10/19/18 1311 10/20/18 0517 10/21/18 0500  Weight: 64.4 kg 65 kg 66.6 kg   .  11.  PAF.  Cardiac rate controlled on Lopressor.  Eliquis on hold.    12.  Ovarian cancer.  Follow-up outpatient.chemotherapy currently on hold until rehabilitation completed for  [facility] 13.  Diet-controlled diabetes mellitus.  Patient currently on a regular diet.   No need to check CBGs on a regular basis   14.  Hyperlipidemia.  Zetia/Lipitor 15.  Anemia: mild iron deficiency, more related to chronic disease  Continue iron supplement for now   - hgb drifting down from 13.3 on 2/17 to 9.5 most recently --up to 9.9 4/28   -stool heme negative  -no signs of gross blood loss        LOS: 9 days A FACE TO [place] 10/21/2018, 9:14 AM

## 2018-10-21 NOTE — Progress Notes (Signed)
Occupational Therapy Session Note  Patient Details  Name: Margaret [name] MRN: [id number] Date of Birth: 1940/09/08  Today's Date: 10/21/2018 OT Individual Time: 0920-1000 OT Individual Time Calculation (min): 40 min    Short Term Goals: Week 2:  OT Short Term Goal 1 (Week 2): Continue working toward established LTG  Skilled Therapeutic Interventions/Progress Updates:    Patient supine in bed and states that she is tired but willing to participate in therapy session.  Full length TEDS donned, abdominal binder in place.  Bed mobility with CS.  SPT with RW to/from bed, toilet and w/c with CG/CS.  Patient ambulated with RW to/from bathroom with CG A.  Toileting with CS, patient able to stand for hand hygiene with CGA.  Overall standing tolerance is 30 seconds at a time due to fatigue and occ c/o lightheadedness.  Completed standing trials with LB marching x4.  Completed light UE arm exercises with poor endurance - able to tolerate 3-5 reps at a time.  Patient returned to bed at close of session with bed alarm set and call bell in place.    Therapy Documentation Precautions:  Precautions Precautions: Fall Precaution Comments: low frustration tolerance  Restrictions Weight Bearing Restrictions: No General:   Vital Signs:  Pain: Pain Assessment Pain Scale: 0-10 Pain Score: 0-No pain   Other Treatments:     Therapy/Group: Individual Therapy  Carlos Levering 10/21/2018, 11:02 AM

## 2018-10-21 NOTE — Progress Notes (Addendum)
Physical Therapy Session Note  Patient Details  Name: Margaret [name] MRN: [id number] Date of Birth: April 09, 1941  Today's Date: 10/21/2018 PT Individual Time: 1137-1201 and 4235-3614 PT Individual Time Calculation (min): 24 min and 55 min  Short Term Goals: Week 2:  PT Short Term Goal 1 (Week 2): STGs = LTGs due to ELOS   Skilled Therapeutic Interventions/Progress Updates:  Treatment 1: Pt received in bed & agreeable to tx, without any c/o pain. Pt transfers to sitting EOB & bed>w/c via stand pivot with supervision and cuing for safety. Pt with thigh high teds donned & therapist tightened abdominal binder. BP sitting in w/c = 103/69 mmHg (LUE), HR = 72 bpm. Pt with groans throughout session stating she does this when SOB but pt with inconsistent reports of SOB & SpO2 WNL. Pt transfers sit<>stand with supervision & ambulates 30 ft with RW & supervision before requiring seated rest break. BP = 115/72 mmHg (LUE), HR = 75 bpm, SpO2 = 100% on room air. Pt ambulates additional 40 ft + 30 ft with RW & supervision. Pt propels w/c back to room ~50 ft with BUE/BLE and supervision for cardiopulmonary endurance training & increasing activity tolerance. At end of session pt left sitting in w/c with chair alarm donned, all needs in reach. BP = 104/62 mmHg (LUE), HR = 71 bpm. Pt asymptomatic throughout session.   Treatment 2: Pt received in bed with family (daugther [name], husband Rush Landmark) present for family education. Educated them on DME recommendations of RW and transport w/c (family requests transport w/c vs manual w/c) and f/u HHPT. Also provided them with energy conservation handouts but educated them pt needs to stay active/mobile as much as possible at home & limit laying in bed all day. Educated them on pt's orthostatic hypotension and need for thigh high teds & abdominal binder when OOB, as well as pt being asymptomatic with positional changes. Discussed home modifications (remove/secure throw rugs) and need to  ensure pets do not get underfoot with family voicing understanding. Therapist demonstrated gait with pt with RW & pt's family return demonstrated with pt ambulating up to ~45 ft with supervision. Pt completes car transfer with RW & supervision with assistance from daughter, and therapist providing cuing to sit then place BLE in/out of car and prevent pulling on RW/car door for sit>stand. Pt & family members negotiate 4 steps x 2 with B rails and CGA with therapist educating family on caregiver positioning in relation to pt with good return demo from family. At end of session pt left sitting in w/c in room with family present to supervise & in handoff to OT.  Addendum: Educated pt/family on need to call EMS if pt experiences a fall at home. Pt did experience slight LOB during turns while ambulating with therapist providing cuing to husband to maintain close proximity to pt.   Therapy Documentation Precautions:  Precautions Precautions: Fall Precaution Comments: low frustration tolerance  Restrictions Weight Bearing Restrictions: No   Therapy/Group: Individual Therapy  Waunita Schooner 10/21/2018, 4:07 PM

## 2018-10-21 NOTE — Patient Care Conference (Signed)
Inpatient RehabilitationTeam Conference and Plan of Care Update Date: 10/18/18   Time: 2:00 PM    Patient Name: Margaret [name]      Medical Record Number: [id number]  Date of Birth: [birth year] Sex: Female         Room/Bed: 4W03C/4W03C-01 Payor Info: Payor: [employer] / Plan: [employer] Must PPO / Product Type: *No Product type* /    Admitting Diagnosis: sah ovarian cancer  Admit Date/Time:  10/12/2018  1:09 PM Admission Comments: No comment available   Primary Diagnosis:  <principal problem not specified> Principal Problem: <principal problem not specified>  Patient Active Problem List   Diagnosis Date Noted  . Traumatic subdural hematoma ([place]) 10/12/2018  . Acute blood loss anemia   . Diabetes mellitus type 2 in nonobese (HCC)   . Malignant neoplasm of ovary ([place])   . PAF (paroxysmal atrial fibrillation) ([place])   . Chronic diastolic congestive heart failure ([place])   . Coronary artery disease involving native coronary artery of native heart without angina pectoris   . Subarachnoid hemorrhage ([place]) 10/08/2018  . Right ovarian cyst 08/12/2018  . Accelerated hypertension 07/31/2018  . Elevated lactic acid level 07/31/2018  . Hypertensive urgency 07/31/2018  . Goals of care, counseling/discussion 07/22/2018  . Primary high grade serous adenocarcinoma of ovary ([place]) 07/13/2018  . Controlled type 2 diabetes mellitus without complication, without long-term current use of insulin ([place]) 02/02/2018  . Bradycardia 01/13/2018  . Essential hypertension 11/04/2017  . Other hyperlipidemia 11/04/2017  . Hx of gastric bypass 11/04/2017  . NSTEMI (non-ST elevated myocardial infarction) ([place]) 06/14/2017  . History of ischemic right PCA stroke 03/27/2016  . Hemianopia, homonymous, left 01/31/2016  . Stroke ([place]) 11/06/2015  . Arthritis of knee, degenerative 05/04/2013  . Diastolic dysfunction 95/18/8416  . CAD S/P percutaneous coronary angioplasty   . Morbid obesity ([place])   . Right  shoulder pain   . Anxiety and depression   . Dyspnea on exertion   . Cellulitis of right leg     Expected Discharge Date: Expected Discharge Date: (TBD - likely beginning of next week)  Team Members Present: Physician leading conference: Dr. Alger Simons Social Worker Present: Alfonse Alpers, LCSW Nurse Present: Other (comment)(Samantha Mims, LPN) PT Present: Lavone Nian, PT OT Present: Other (comment)(Sandra Rosana Hoes, OT) SLP Present: Weston Anna, SLP PPS Coordinator present : Gunnar Fusi     Current Status/Progress Goal Weekly Team Focus  Medical   SDH, hx of ovarian cancer. PAF, volatile BP's. appetite hit or miss., activity tolerance poor  bp mgt, nutrition optimization,      Bowel/Bladder   Continent of bowel and bladder; LBM 4/29  Min assist  Assess and treat for constipation as needed.   Swallow/Nutrition/ Hydration             ADL's   Limited by orthostatic hypotension and activity tolerance- with increased time and rest breaks, performs ADLs with CGA  Supervision overall ADLs and transfers  Functional activity tolerance, ADL transfers and retraining    Mobility             Communication             Safety/Cognition/ Behavioral Observations            Pain   No complaints of pain  < 4  Assess and treat for pain q shift and prn   Skin   No skin issues  Min assist  Assess skin q shift and prn    Rehab Goals Patient on  target to meet rehab goals: Yes Rehab Goals Revised: none  *See Care Plan and progress notes for long and short-term goals.     Barriers to Discharge  Current Status/Progress Possible Resolutions Date Resolved   Physician                    Nursing                  PT                    OT                  SLP                SW                Discharge Planning/Teaching Needs:  Pt plans to return to her home with her husband to provide supervision.  Pt's family is coming for family education from 2-4pm on Friday, May  1st.    Team Discussion:  Pt with subdural bleed and hx of ovarian cancer.  She has made nice gains cognitively and is perkier.  She has chronic anemia, pain seems okay. Pt is not eating well per MD and RN and she's having some low BP radings.  Dr. Naaman Plummer is trying Norvasc as she needs this and TEDs and abdominal binder.Pt with poor motivation with PT and memory seems to be an issue.  She wants to leave and is fatigued.  Participated at chair level is min A to tx and supervision for bed mobility, CGA for walking and PT asked for SLP consult and they have already seen her and family states she is back to baseline.  Pt was in tears with OT about family dynamics and wondering how things will be when she goes home.  Revisions to Treatment Plan:  none    Continued Need for Acute Rehabilitation Level of Care: The patient requires daily medical management by a physician with specialized training in physical medicine and rehabilitation for the following conditions: Daily direction of a multidisciplinary physical rehabilitation program to ensure safe treatment while eliciting the highest outcome that is of practical value to the patient.: Yes Daily medical management of patient stability for increased activity during participation in an intensive rehabilitation regime.: Yes Daily analysis of laboratory values and/or radiology reports with any subsequent need for medication adjustment of medical intervention for : Post surgical problems;Neurological problems   I attest that I was present, lead the team conference, and concur with the assessment and plan of the team.Team conference was held via web/ teleconference due to [place] - 19.   Conchetta Lamia, Silvestre Mesi 10/21/2018, 4:12 PM

## 2018-10-21 NOTE — Progress Notes (Signed)
Social Work Patient ID: Margaret [name], female   DOB: [birth year], 78 y.o.   MRN: [id number]   CSW met with pt and then spoke with dtr via telephone and told them that pt is progressing well physically and that MD expects she can be discharged early next week.  Suggested that they come in for family education this week and they chose to come today for a PT and OT session.  CSW will order any needed DME and arrange HH.  CSW also spoke with Market researcher of [employer], pt's insurance, to keep him updated.  CSW will continue to follow and assist as needed.

## 2018-10-22 ENCOUNTER — Inpatient Hospital Stay (HOSPITAL_COMMUNITY): Payer: PPO

## 2018-10-22 ENCOUNTER — Inpatient Hospital Stay (HOSPITAL_COMMUNITY): Payer: PPO | Admitting: Occupational Therapy

## 2018-10-22 DIAGNOSIS — D638 Anemia in other chronic diseases classified elsewhere: Secondary | ICD-10-CM

## 2018-10-22 DIAGNOSIS — I5041 Acute combined systolic (congestive) and diastolic (congestive) heart failure: Secondary | ICD-10-CM

## 2018-10-22 DIAGNOSIS — I10 Essential (primary) hypertension: Secondary | ICD-10-CM

## 2018-10-22 NOTE — Progress Notes (Signed)
Patient slept fairly well throughout the night. Tolerated IVF without difficulty, no problems voiced throughout the night.

## 2018-10-22 NOTE — Progress Notes (Signed)
Occupational Therapy Session Note  Patient Details  Name: Margaret [name] MRN: [id number] Date of Birth: [birth year]  Today's Date: 10/22/2018 OT Individual Time: 1430-1525 OT Individual Time Calculation (min): 55 min    Short Term Goals: Week 2:  OT Short Term Goal 1 (Week 2): Continue working toward established LTG  Skilled Therapeutic Interventions/Progress Updates:    Patient in bed upon arrival.  She states that she is exhausted but willing to participate in therapy session.  Bed mobility SSP to/from supine with CS/CG A.  Functional ambulation short distances in room and SPT to/from bed, recliner and toilet with CG A.  Patient with increased crying/moaning during activity in stance and short distance ambulation today - she denies pain and states that she is experiencing more fatigue today.  She was able to tolerate sitting in the recliner and completing light reaching activities with 6-8 reps and rest breaks between activities.  Recall and awareness of date are impaired but she was able to direct me to pictures of her bath set up on her phone with min A to locate the pictures - shower stall appears small with low corner built in seat and metal framed shower door.  She did not have information on dimensions at this time.  Patient requires min a for toileting and to change underwear after washing peri area and bottom in supine position.  Nursing aware of fatigue.  She remained in bed at close of session with bed alarm set and call bell/tray table in reach.    Therapy Documentation Precautions:  Precautions Precautions: Fall Precaution Comments: low frustration tolerance  Restrictions Weight Bearing Restrictions: No General:   Vital Signs: Therapy Vitals Temp: 98.7 F (37.1 C) Pulse Rate: 72 Resp: 16 BP: 120/61 Patient Position (if appropriate): Lying Oxygen Therapy SpO2: 100 % O2 Device: Room Air Pain: Pain Assessment Pain Scale: 0-10 Pain Score: 0-No pain   Therapy/Group:  Individual Therapy  Carlos Levering 10/22/2018, 3:40 PM

## 2018-10-22 NOTE — Progress Notes (Signed)
Physical Therapy Session Note  Patient Details  Name: Shakhia Gramajo MRN: [id number] Date of Birth: 1940/11/12  Today's Date: 10/22/2018 PT Individual Time: 0830-0940 PT Individual Time Calculation (min): 70 min   Short Term Goals: Week 2:  PT Short Term Goal 1 (Week 2): STGs = LTGs due to ELOS   Skilled Therapeutic Interventions/Progress Updates:    Patient in supine reports didn't want to eat breakfast, lunch is her normal main meal.  Applied THT in supine.  Performed supine therex as noted below.  Patient supine to sit with S.  Sit to stand to RW CGA and ambulated to bathroom with RW and CGA.  Toileting min A for balance/clothing and pt ambulated to w/c outside bathroom with RW and CGA.  Seated in w/c to wash hands at sink.  Requested to toilet again.  Gait back in and pt had BM.  Sit to stand S and assisted with hygiene due to pt moaning and c/o fatigue.  Ambulated to w/c to again wash hands seated at sink.  Pushed in w/c to therapy gym.  Patient performed stair negotiation x1 then x 4 steps with rails and CGA.  Patient assisted to dayroom in w/c.  Transfer to Nu Step S with RW.  Performed UE/LE seated @ level 1 x 2.5 min, then another 2.5 min after resting.  Stand step to w/c with RW.  Assisted to room in w/c.  Squat pivot to bed from w/c with S.  Sit to stand x 5 reps from EOB with CGA.  Patient issued written HEP with bed ex and sit <> stand as performed this session.  Reviewed with pt and she reported understanding.  Sit to supine with S.  Left in resource notebook in the room.  Patient left in supine with call bell in reach and bed alarm activated.   Therapy Documentation Precautions:  Precautions Precautions: Fall Precaution Comments: low frustration tolerance  Restrictions Weight Bearing Restrictions: No   Pain: Pain Assessment Pain Scale: 0-10 Pain Score: 0-No pain   Exercises: General Exercises - Lower Extremity Ankle Circles/Pumps: AROM;10 reps;Supine Short Arc Quad:  AROM;Strengthening;10 reps;Supine Heel Slides: AROM;10 reps;Supine;Both Hip ABduction/ADduction: Strengthening;Both;10 reps;Supine Hip Flexion/Marching: Both;Strengthening;Supine(in hooklying) Total Joint Exercises Bridges: Strengthening;Both;10 reps;Supine Other Exercises Other Exercises: Lateral trunk rolls x 10    Therapy/Group: Individual Therapy  Reginia Naas  Magda Kiel, PT 10/22/2018, 12:17 PM

## 2018-10-22 NOTE — Progress Notes (Signed)
[facility] PHYSICAL MEDICINE & REHABILITATION PROGRESS NOTE   Subjective/Complaints: Patient seen laying in bed this morning.  She states she slept well overnight.  She wants to know when she will be discharged.  ROS: Denies chest pain, shortness of breath, nausea, vomiting, diarrhea..   Objective:   No results found. No results for input(s): WBC, HGB, HCT, PLT in the last 72 hours. Recent Labs    10/21/18 0454  NA 143  K 3.4*  CL 112*  CO2 22  GLUCOSE 94  BUN 24*  CREATININE 0.81  CALCIUM 9.1    Intake/Output Summary (Last 24 hours) at 10/22/2018 0901 Last data filed at 10/22/2018 0455 Gross per 24 hour  Intake 480 ml  Output 350 ml  Net 130 ml     Physical Exam: Vital Signs Blood pressure 122/81, pulse 79, temperature 98.1 F (36.7 C), resp. rate 16, height 5\' 8"  (1.727 m), weight 64.9 kg, SpO2 100 %. Constitutional: No distress . Vital signs reviewed. HENT: Normocephalic.  Atraumatic. Eyes: EOMI. No discharge. Cardiovascular: No JVD. Respiratory: Normal effort. GI: Non-distended. Musc: No edema or tenderness in extremities. Neurological: alert  Fair insight and awareness.  Motor: Grossly 4+/5 throughout Skin: Skin is warm and dry.  Scattered bruising healing Psychiatric: pleasant and cooperative. No agitated or delusional behaviors seen   Assessment/Plan: 1. Functional deficits secondary to traumatic SAH which require 3+ hours per day of interdisciplinary therapy in a comprehensive inpatient rehab setting.  Physiatrist is providing close team supervision and 24 hour management of active medical problems listed below.  Physiatrist and rehab team continue to assess barriers to discharge/monitor patient progress toward functional and medical goals  Care Tool:  Bathing  Bathing activity did not occur: Refused Body parts bathed by patient: Right arm, Left arm, Chest, Abdomen, Front perineal area, Buttocks, Right upper leg, Left upper leg, Right lower leg, Left  lower leg, Face         Bathing assist Assist Level: Minimal Assistance - Patient > 75%     Upper Body Dressing/Undressing Upper body dressing   What is the patient wearing?: Pull over shirt    Upper body assist Assist Level: Supervision/Verbal cueing    Lower Body Dressing/Undressing Lower body dressing      What is the patient wearing?: Underwear/pull up, Pants     Lower body assist Assist for lower body dressing: Contact Guard/Touching assist     Toileting Toileting    Toileting assist Assist for toileting: Contact Guard/Touching assist     Transfers Chair/bed transfer  Transfers assist     Chair/bed transfer assist level: Supervision/Verbal cueing     Locomotion Ambulation   Ambulation assist      Assist level: Supervision/Verbal cueing Assistive device: Walker-rolling Max distance: 45 ft   Walk 10 feet activity   Assist     Assist level: Supervision/Verbal cueing Assistive device: Walker-rolling   Walk 50 feet activity   Assist    Assist level: Contact Guard/Touching assist Assistive device: Walker-rolling    Walk 150 feet activity   Assist Walk 150 feet activity did not occur: Safety/medical concerns(fatigue )         Walk 10 feet on uneven surface  activity   Assist     Assist level: Minimal Assistance - Patient > 75% Assistive device: Aeronautical engineer Will patient use wheelchair at discharge?: No Type of Wheelchair: Manual    Wheelchair assist level: Supervision/Verbal cueing Max wheelchair distance: 50 ft  Wheelchair 50 feet with 2 turns activity    Assist        Assist Level: Supervision/Verbal cueing   Wheelchair 150 feet activity     Assist     Assist Level: Supervision/Verbal cueing    Medical Problem List and Plan: 1.  Decreased functional mobility secondary to traumatic SAH left sylvian fissure, left temporal, right frontal            Continue  CIR  -appears to have a complicated psychosocial situation by report 2.  Antithrombotics: -DVT/anticoagulatio SCn:Ds             -antiplatelet therapy: Chronic Plavix and Eliquis held due to [facility] 3. Pain Management: Hydrocodone and Flexeril as needed  -ice to coccyx  4. Mood: Zoloft 100 mg daily             -antipsychotic agents: N/A 5. Neuropsych: This patient is?  Fully capable of making decisions on her own behalf. 6. Skin/Wound Care: Routine skin checks 7. Fluids/Electrolytes/Nutrition:  -continue encourage PO  -protein supp for low albumin   -prealbumin 13.2  -BUN improved sl to 24 on 5/1, labs ordered for Monday  -continue 1/2ns at 50cc/hr IVF at [facility] [place] on 5/3  - megace for appetite  -appreciate RD assistance  Variable p.o. intake 8.  Hypertension.  Lopressor 25 mg twice daily, Cozaar 100 mg daily, Norvasc 2.5 mg daily had been held due to hypotension---pressures dropping in therapy  Vitals:   10/21/18 1955 10/22/18 0450  BP: 135/68 122/81  Pulse: 67 79  Resp: 16 16  Temp: 98.5 F (36.9 C) 98.1 F (36.7 C)  SpO2: 100% 100%  --TEDS/abd binders  -consider midodrine trial if she remains symptomatic -hydration   Controlled on 5/2 9.  CAD with stenting.  Plavix currently on hold. 10.  Diastolic congestive heart failure.    Filed Weights   10/20/18 0517 10/21/18 0500 10/22/18 0450  Weight: 65 kg 66.6 kg 64.9 kg   Stable on 5/2 11.  PAF.  Cardiac rate controlled on Lopressor.  Eliquis on hold.    12.  Ovarian cancer.  Follow-up outpatient.chemotherapy currently on hold until rehabilitation completed for [facility] 13.  Diet-controlled diabetes mellitus.  Patient currently on a regular diet.   No need to check CBGs on a regular basis   14.  Hyperlipidemia.  Zetia/Lipitor 15.  Anemia: mild iron deficiency, more related to chronic disease  Continue iron supplement for now  Hemoglobin 9.9 on 4/28, labs ordered for Monday   -stool heme negative  -no signs of gross blood loss    LOS: 10 days A FACE TO FACE EVALUATION WAS PERFORMED  Ankit Lorie Phenix 10/22/2018, 9:01 AM

## 2018-10-23 ENCOUNTER — Inpatient Hospital Stay (HOSPITAL_COMMUNITY): Payer: PPO

## 2018-10-23 DIAGNOSIS — E876 Hypokalemia: Secondary | ICD-10-CM

## 2018-10-23 DIAGNOSIS — I5042 Chronic combined systolic (congestive) and diastolic (congestive) heart failure: Secondary | ICD-10-CM

## 2018-10-23 NOTE — Progress Notes (Signed)
Patient alert and verbal responsive, periods of anxious and agitation throughout shift; denies any pain or discomfort. Comfort and relaxations techniques provided. CHG bath given this shift. Will continue to monitor

## 2018-10-23 NOTE — Plan of Care (Signed)
  Problem: Consults Goal: RH BRAIN INJURY PATIENT EDUCATION Description Description: See Patient Education module for eduction specifics Outcome: Progressing   Problem: RH SKIN INTEGRITY Goal: RH STG SKIN FREE OF INFECTION/BREAKDOWN Description No new breakdown with min assist/cues   Outcome: Progressing   Problem: RH SAFETY Goal: RH STG ADHERE TO SAFETY PRECAUTIONS W/ASSISTANCE/DEVICE Description STG Adhere to Safety Precautions With Min Assistance/Device.  Outcome: Progressing   Problem: RH PAIN MANAGEMENT Goal: RH STG PAIN MANAGED AT OR BELOW PT'S PAIN GOAL Description < 3 out of 10.   Outcome: Progressing

## 2018-10-23 NOTE — Progress Notes (Signed)
[facility] PHYSICAL MEDICINE & REHABILITATION PROGRESS NOTE   Subjective/Complaints: Patient seen laying in bed this morning.  She states she slept well overnight.  She has questions regarding discharge date again.  ROS: Denies chest pain, shortness of breath, nausea, vomiting, diarrhea..   Objective:   No results found. No results for input(s): WBC, HGB, HCT, PLT in the last 72 hours. Recent Labs    10/21/18 0454  NA 143  K 3.4*  CL 112*  CO2 22  GLUCOSE 94  BUN 24*  CREATININE 0.81  CALCIUM 9.1    Intake/Output Summary (Last 24 hours) at 10/23/2018 0920 Last data filed at 10/22/2018 1800 Gross per 24 hour  Intake 240 ml  Output -  Net 240 ml     Physical Exam: Vital Signs Blood pressure (!) 100/50, pulse 72, temperature 98.3 F (36.8 C), resp. rate 16, height 5\' 8"  (1.727 m), weight 65.9 kg, SpO2 100 %. Constitutional: No distress . Vital signs reviewed. HENT: Normocephalic.  Atraumatic. Eyes: EOMI.  No discharge. Cardiovascular: No JVD. Respiratory: Normal effort. GI: Non-distended. Musc: No edema or tenderness in extremities. Neurological: Alert Fair insight and awareness.  Motor: Grossly 4+/5 throughout, stable Skin: Skin is warm and dry.  Scattered bruising healing Psychiatric: pleasant and cooperative. No agitated or delusional behaviors seen  Assessment/Plan: 1. Functional deficits secondary to traumatic SAH which require 3+ hours per day of interdisciplinary therapy in a comprehensive inpatient rehab setting.  Physiatrist is providing close team supervision and 24 hour management of active medical problems listed below.  Physiatrist and rehab team continue to assess barriers to discharge/monitor patient progress toward functional and medical goals  Care Tool:  Bathing  Bathing activity did not occur: Refused Body parts bathed by patient: Right arm, Left arm, Chest, Abdomen, Front perineal area, Buttocks, Right upper leg, Left upper leg, Right lower  leg, Left lower leg, Face         Bathing assist Assist Level: Minimal Assistance - Patient > 75%     Upper Body Dressing/Undressing Upper body dressing   What is the patient wearing?: Pull over shirt    Upper body assist Assist Level: Supervision/Verbal cueing    Lower Body Dressing/Undressing Lower body dressing      What is the patient wearing?: Underwear/pull up, Pants     Lower body assist Assist for lower body dressing: Contact Guard/Touching assist     Toileting Toileting    Toileting assist Assist for toileting: Moderate Assistance - Patient 50 - 74%     Transfers Chair/bed transfer  Transfers assist     Chair/bed transfer assist level: Supervision/Verbal cueing     Locomotion Ambulation   Ambulation assist      Assist level: Supervision/Verbal cueing Assistive device: Walker-rolling Max distance: 30'   Walk 10 feet activity   Assist     Assist level: Contact Guard/Touching assist Assistive device: Walker-rolling   Walk 50 feet activity   Assist    Assist level: Contact Guard/Touching assist Assistive device: Walker-rolling    Walk 150 feet activity   Assist Walk 150 feet activity did not occur: Safety/medical concerns(fatigue )         Walk 10 feet on uneven surface  activity   Assist     Assist level: Minimal Assistance - Patient > 75% Assistive device: Aeronautical engineer Will patient use wheelchair at discharge?: No Type of Wheelchair: Manual    Wheelchair assist level: Supervision/Verbal cueing Max wheelchair distance:  50 ft    Wheelchair 50 feet with 2 turns activity    Assist        Assist Level: Supervision/Verbal cueing   Wheelchair 150 feet activity     Assist     Assist Level: Supervision/Verbal cueing    Medical Problem List and Plan: 1.  Decreased functional mobility secondary to traumatic SAH left sylvian fissure, left temporal, right frontal             Continue CIR  Appears to have a complicated psychosocial situation per report 2.  Antithrombotics: -DVT/anticoagulatio SCn:Ds             -antiplatelet therapy: Chronic Plavix and Eliquis held due to [facility] 3. Pain Management: Hydrocodone and Flexeril as needed  -ice to coccyx  4. Mood: Zoloft 100 mg daily             -antipsychotic agents: N/A 5. Neuropsych: This patient is?  Fully capable of making decisions on her own behalf. 6. Skin/Wound Care: Routine skin checks 7. Fluids/Electrolytes/Nutrition:  -continue encourage PO  -protein supp for low albumin   -prealbumin 13.2  -BUN improved sl to 24 on 5/1, labs ordered for tomorrow  -continue 1/2ns at 50cc/hr IVF at [facility] [place] on 5/3  - megace for appetite  -appreciate RD assistance  Variable p.o. intake 8.  Hypertension.  Lopressor 25 mg twice daily, Cozaar 100 mg daily, Norvasc 2.5 mg daily had been held due to hypotension  Vitals:   10/22/18 2010 10/23/18 0512  BP: 136/64 (!) 100/50  Pulse: 70 72  Resp: 16 16  Temp: 98.8 F (37.1 C) 98.3 F (36.8 C)  SpO2: 98% 100%  --TEDS/abd binders  -consider midodrine trial if she remains symptomatic -hydration   Overall improved, however slightly low this morning. 9.  CAD with stenting.  Plavix currently on hold. 10.  Diastolic congestive heart failure.    Filed Weights   10/21/18 0500 10/22/18 0450 10/23/18 0451  Weight: 66.6 kg 64.9 kg 65.9 kg   Stable on 5/3 11.  PAF.  Cardiac rate controlled on Lopressor.  Eliquis on hold.    12.  Ovarian cancer.  Follow-up outpatient.chemotherapy currently on hold until rehabilitation completed for [facility] 13.  Diet-controlled diabetes mellitus.  Patient currently on a regular diet.   No need to check CBGs on a regular basis   14.  Hyperlipidemia.  Zetia/Lipitor 15.  Anemia: mild iron deficiency, more related to chronic disease  Continue iron supplement for now  Hemoglobin 9.9 on 4/28, labs ordered for tomorrow   -stool heme negative  -no signs of  gross blood loss 16.  Hypokalemia  Labs ordered for tomorrow   LOS: 11 days A FACE TO FACE EVALUATION WAS PERFORMED  Sherrie Marsan Lorie Phenix 10/23/2018, 9:20 AM

## 2018-10-23 NOTE — Progress Notes (Signed)
Occupational Therapy Session Note  Patient Details  Name: Margaret [name] MRN: [id number] Date of Birth: 1940-12-14  Today's Date: 10/23/2018 OT Individual Time: 3361-2244 OT Individual Time Calculation (min): 70 min    Short Term Goals: Week 2:  OT Short Term Goal 1 (Week 2): Continue working toward established LTG  Skilled Therapeutic Interventions/Progress Updates:    Pt received supine, no c/o pain. Pt transferred to EOB with (S). Pt instructed on donning teds, requiring intervention to use chair to prop feet up to reach without excessive trunk flexion. Pt able to don teds and socks with propping method with (S). Abdominal binder donned with min A. Pt used RW with cueing for hand placement and completed functional mobility into bathroom with CGA. Pt completed toileting tasks with close (S). BP: 120/72. Pt returned to w/c and was transported to therapy gym. Pt completed standing level functional reaching to increase dynamic standing balance and functional activity tolerance. Pt required frequent seated rest breaks, only able to stand for ~30 sec at a time. Pt reported feeling nauseas with no other symptoms so BP was obtained- 95/63. Pt remained seated and was instructed in BUE circuit with a 3# dowel. Cueing provided for encouragement and technique/muscle activation. Pt returned to room and used RW to enter bathroom and transfer to toilet with CGA. Min A required for peri hygiene d/t fatigue setting in. Pt returned to supine in bed and was left with all needs met and bed alarm set.   Therapy Documentation Precautions:  Precautions Precautions: Fall Precaution Comments: low frustration tolerance  Restrictions Weight Bearing Restrictions: No   Therapy/Group: Individual Therapy  Curtis Sites 10/23/2018, 7:09 AM

## 2018-10-23 NOTE — Progress Notes (Signed)
Physical Therapy Session Note  Patient Details  Name: Brookelle Pellicane MRN: [id number] Date of Birth: 07/12/1940  Today's Date: 10/23/2018 PT Individual Time: 1301-1357 PT Individual Time Calculation (min): 56 min   Short Term Goals: Week 1:  PT Short Term Goal 1 (Week 1): STGs = LTGs due to ELOS   Skilled Therapeutic Interventions/Progress Updates:    Pt supine in bed upon PT arrival, agreeable to therapy tx and denies pain. Pt transferred to sitting EOB with supervsion and increased time. Pt performed sit>stand with RW and CGA, stand pivot to w/c with CGA and transported to the dayroom. Pt ambulated 2 x 10 ft this session with RW and CGA from w/c<>nustep, pt used nustep x 5 minutes on workload 3 working on activity tolerance and endurance. Pt transported to gym, pt ambulated x 48 ft and x 22 ft with RW and CGA working on activity tolerance. Pt transferred to supine on mat with supervision in order to review HEP. Pt performed the following exercises per HEP for LE strengthening, therapist providing cues for proper techniques: x 10 heel slides, 2 x 10 hip abduction, x 10 bridges and x 5 sit<>stands. Pt with increased crying/groaning throughout these exercises, therapist providing education on importance of strengthening exercises. Pt performed stand pivot to w/c with CGA and transported back to room. Stand pivot to bed CGA and sit>supine with supervision. Pt left supine with needs in reach and bed alarm set.   Therapy Documentation Precautions:  Precautions Precautions: Fall Precaution Comments: low frustration tolerance  Restrictions Weight Bearing Restrictions: No    Therapy/Group: Individual Therapy  Netta Corrigan , PT, DPT 10/23/2018, 12:08 PM

## 2018-10-24 ENCOUNTER — Inpatient Hospital Stay (HOSPITAL_COMMUNITY): Payer: PPO

## 2018-10-24 ENCOUNTER — Inpatient Hospital Stay (HOSPITAL_COMMUNITY): Payer: PPO | Admitting: Physical Therapy

## 2018-10-24 LAB — CBC WITH DIFFERENTIAL/PLATELET
Abs Immature Granulocytes: 0.07 10*3/uL (ref 0.00–0.07)
Basophils Absolute: 0 10*3/uL (ref 0.0–0.1)
Basophils Relative: 0 %
Eosinophils Absolute: 0 10*3/uL (ref 0.0–0.5)
Eosinophils Relative: 0 %
HCT: 29 % — ABNORMAL LOW (ref 36.0–46.0)
Hemoglobin: 9.6 g/dL — ABNORMAL LOW (ref 12.0–15.0)
Immature Granulocytes: 1 %
Lymphocytes Relative: 9 %
Lymphs Abs: 0.9 10*3/uL (ref 0.7–4.0)
MCH: 32 pg (ref 26.0–34.0)
MCHC: 33.1 g/dL (ref 30.0–36.0)
MCV: 96.7 fL (ref 80.0–100.0)
Monocytes Absolute: 0.8 10*3/uL (ref 0.1–1.0)
Monocytes Relative: 8 %
Neutro Abs: 7.8 10*3/uL — ABNORMAL HIGH (ref 1.7–7.7)
Neutrophils Relative %: 82 %
Platelets: 191 10*3/uL (ref 150–400)
RBC: 3 MIL/uL — ABNORMAL LOW (ref 3.87–5.11)
RDW: 17.6 % — ABNORMAL HIGH (ref 11.5–15.5)
WBC: 9.6 10*3/uL (ref 4.0–10.5)
nRBC: 0 % (ref 0.0–0.2)

## 2018-10-24 LAB — BASIC METABOLIC PANEL
Anion gap: 7 (ref 5–15)
BUN: 20 mg/dL (ref 8–23)
CO2: 23 mmol/L (ref 22–32)
Calcium: 9 mg/dL (ref 8.9–10.3)
Chloride: 113 mmol/L — ABNORMAL HIGH (ref 98–111)
Creatinine, Ser: 0.91 mg/dL (ref 0.44–1.00)
GFR calc Af Amer: >60 mL/min (ref >60)
GFR calc non Af Amer: >60 mL/min (ref >60)
Glucose, Bld: 154 mg/dL — ABNORMAL HIGH (ref 70–99)
Potassium: 3.7 mmol/L (ref 3.5–5.1)
Sodium: 143 mmol/L (ref 135–145)

## 2018-10-24 MED ORDER — HEPARIN SOD (PORK) LOCK FLUSH 100 UNIT/ML IV SOLN
500.0000 [IU] | INTRAVENOUS | Status: AC | PRN
  Administered 2018-10-24: 19:00:00 500 [IU]

## 2018-10-24 NOTE — Discharge Summary (Signed)
Physician Discharge Summary  Patient ID: Margaret [name] MRN: [id number] DOB/AGE: 80/30/1942 77 y.o.  Admit date: 10/12/2018 Discharge date: 10/25/2018  Discharge Diagnoses:  Active Problems:   Traumatic subdural hematoma (HCC)   Anemia of chronic disease   Acute combined systolic and diastolic congestive heart failure (HCC)   Hypokalemia   Chronic combined systolic and diastolic congestive heart failure (HCC) Hypertension CAD with stenting PAF Ovarian cancer Diet-controlled diabetes mellitus UTI Decreased nutritional storage  Discharged Condition: Stable  Significant Diagnostic Studies: Dg Chest 1 View  Result Date: 10/07/2018 CLINICAL DATA:  Weakness for 2 days EXAM: CHEST  1 VIEW COMPARISON:  CT chest 07/19/2018 FINDINGS: There is a right-sided Port-A-Cath with the tip projecting over the SVC. There is no focal parenchymal opacity. There is no pleural effusion or pneumothorax. The heart and mediastinal contours are unremarkable. The osseous structures are unremarkable. IMPRESSION: No active disease. Electronically Signed   By: Kathreen Devoid   On: 10/07/2018 19:38   Ct Head Wo Contrast  Result Date: 10/08/2018 CLINICAL DATA:  78 y/o  F; subarachnoid hemorrhage after fall. EXAM: CT HEAD WITHOUT CONTRAST TECHNIQUE: Contiguous axial images were obtained from the base of the skull through the vertex without intravenous contrast. COMPARISON:  10/07/2018 CT head. FINDINGS: Brain: Stable small volume subarachnoid hemorrhage in left sylvian fissure, over left temporal lobe, and over right frontal lobe. No new intracranial hemorrhage identified. Stable chronic right PCA distribution infarction. Stable small chronic infarction within the left corona radiata. Stable chronic microvascular ischemic changes and volume loss of the brain. No hydrocephalus or herniation. Vascular: Calcific atherosclerosis of carotid siphons. No hyperdense vessel identified. Skull: Normal. Negative for fracture or focal  lesion. Sinuses/Orbits: No acute finding. Other: Bilateral intra-ocular lens replacement. IMPRESSION: 1. Stable small volume subarachnoid hemorrhage. No new intracranial hemorrhage identified. 2. Stable chronic microvascular ischemic changes, volume loss of the brain, and chronic infarctions. Electronically Signed   By: Kristine Garbe M.D.   On: 10/08/2018 06:15   Ct Head Wo Contrast  Result Date: 10/07/2018 CLINICAL DATA:  Multiple recent falls. Weakness. EXAM: CT HEAD WITHOUT CONTRAST TECHNIQUE: Contiguous axial images were obtained from the base of the skull through the vertex without intravenous contrast. COMPARISON:  06/14/2017 FINDINGS: Brain: Small volume subarachnoid hemorrhage is present in right frontal sulci, left temporal sulci, and left sylvian fissure. No subdural or epidural hematoma is identified. A large chronic right PCA infarct is again noted. Bilateral cerebral white matter hypodensities are similar to the prior study and nonspecific but compatible with mild chronic small vessel ischemic disease. A chronic lacunar infarct in the left corona radiata is unchanged. No acute infarct or intracranial mass effect is identified. Cerebral atrophy is unchanged. Vascular: Calcified atherosclerosis at the skull base. No hyperdense vessel. Skull: No fracture or suspicious osseous lesion. Sinuses/Orbits: Visualized paranasal sinuses and mastoid air cells are clear. Bilateral cataract extraction is noted. Other: None. IMPRESSION: 1. Small volume bilateral subarachnoid hemorrhage. 2. Chronic ischemic changes including a large right PCA infarct. Critical Value/emergent results were called by telephone at the time of interpretation on 10/07/2018 at 8:14 pm to Dr. Delman Kitten , who verbally acknowledged these results. Electronically Signed   By: Logan Bores M.D.   On: 10/07/2018 20:22    Labs:  Basic Metabolic Panel: Recent Labs  Lab 10/18/18 1806 10/21/18 0454 10/24/18 0947  NA 143 143 143   K 4.0 3.4* 3.7  CL 111 112* 113*  CO2 23 22 23   GLUCOSE 90 94 154*  BUN  27* 24* 20  CREATININE 0.87 0.81 0.91  CALCIUM 9.4 9.1 9.0    CBC: Recent Labs  Lab 10/17/18 1800 10/18/18 1806 10/24/18 0947  WBC 10.7* 8.5 9.6  NEUTROABS  --   --  7.8*  HGB 9.9* 9.9* 9.6*  HCT 30.2* 30.5* 29.0*  MCV 95.0 95.9 96.7  PLT 247 265 191    CBG: No results for input(s): GLUCAP in the last 168 hours.  Family history.  Father with CAD with MI, sister with diabetes and hypertension.  Brother with myocardial infarction as well as diabetes mellitus, mother with colon cancer.  Maternal grandmother and grandfather with diabetes  Brief HPI:    Margaret [name] is a 78 year old right-handed female history of CAD and stenting maintained on Plavix, diastolic congestive heart failure, diabetes mellitus, PAF maintained on Eliquis followed by Dr. Martinique as well as ovarian cancer maintained on chemotherapy.  Patient lives with spouse ambulated with a rolling walker prior to admission.  Presented 10/08/2018 after fall noted increased lethargy.  No nausea vomiting.  Cranial CT scan showed small bilateral subarachnoid hemorrhage right greater than left.  Chronic ischemic changes including a large right PCA infarction.  Eliquis and Plavix were held.  Neurosurgery Dr. Vertell Limber recommended conservative care.  Follow-up cranial CT scan 10/08/2018 stable.  Recommendation per cardiology services to continue to hold anticoagulation.  Patient was admitted for a comprehensive rehab program.  Hospital Course: Margaret [name] was admitted to rehab 10/12/2018 for inpatient therapies to consist of PT, ST and OT at least three hours five days a week. Past admission physiatrist, therapy team and rehab RN have worked together to provide customized collaborative inpatient rehab.  Pertaining to patient's traumatic SAH she would follow-up neurosurgery.  Anticoagulation remains on hold.  Mood stabilization was Zoloft.  Blood pressure control with  Lopressor, Norvasc as well as Cozaar would follow-up with cardiology services.  PAF cardiac rate controlled continue beta-blocker again Eliquis and Plavix remained on hold.  No chest pain or shortness of breath.  She continues on Lipitor for hyperlipidemia.  Urine culture 80,000 E. coli completing a course of Keflex.  She remained afebrile without any dysuria.  Patient with history of ovarian cancer plan was to follow-up outpatient chemotherapy currently on hold.  Her appetite was limited noted decreased nutritional storage Megace was added to help stimulate appetite   Rehab course: During patient's stay in rehab weekly team conferences were held to monitor patient's progress, set goals and discuss barriers to discharge. At admission, patient required minimal assist to ambulate 70 feet rolling walker, minimal assist stand pivot transfers, minimal assist sit to supine.  Moderate assist lower body bathing minimal assist upper body bathing set up for upper body dressing moderate assist lower body dressing.  Physical exam.  Blood pressure 142/74 pulse 79 temperature 97.5 respirations 13 oxygen saturation 98% room air Constitutional.  Oriented to person place and time well-developed HEENT Head.  Normocephalic and atraumatic Eyes.  EOMs normal no nystagmus without discharge Respiratory.  Effort normal no respiratory distress without wheeze GI.  Exhibits no distention nontender without rebound Musculoskeletal.  No edema tenderness in extremities Neurological.  Alert and oriented makes good eye contact with examiner fair insight of deficits grossly graded strength 4+ out of 5.  DTRs symmetrical  She  has had improvement in activity tolerance, balance, postural control as well as ability to compensate for deficits. Margaret [name] has had improvement in functional use RUE/LUE  and RLE/LLE as well as improvement in awareness.  Patient transfers  sit to stand with supervision.  Propels wheelchair supervision.  Completes car  transfers supervision and rolling walker when negotiating a ramp ambulates 40 feet rolling walker supervision close monitoring for any loss of balance.  Transition to edge of bed supervision completed upper body bathing supervision completed lower body bathing with frequent rest breaks but no more than supervision.  Family teaching completed plan discharged home       Disposition: Discharge to home   Diet: Regular  Special Instructions: Hold on Plavix and Eliquis until follow-up with neurosurgery and cardiology services  Medications at discharge 1.  Tylenol as needed 2.  Norvasc 2.5 mg p.o. daily 3.  Lipitor 80 mg p.o. daily 4.  Keflex 250 mg p.o. every 8 hours completing course 5.  Flexeril 10 mg 3 times daily as needed 6.  Zetia 10 mg p.o. daily 7.  Hydrocodone 1 tablet every 4 hours as needed pain 8.  Cozaar 100 mg p.o. daily 9.  Lopressor 25 mg p.o. twice daily 10.  Multivitamin daily 11.  Nitroglycerin as needed 12.  Protonix 40 mg p.o. daily 13.  Senokot S 1 tablet daily 14 Zoloft 100 mg p.o. daily 15 vitamin D 50,000 units every Monday 16.  Megace 400 mg twice daily 17.  Potassium 10 mEq daily  Discharge Instructions    Ambulatory referral to Physical Medicine Rehab   Complete by:  As directed    Moderate complexity follow-up 1 to 2 weeks traumatic SAH      Follow-up Information    Meredith Staggers, MD Follow up.   Specialty:  Physical Medicine and Rehabilitation Why:  office to call for appointment Contact information: [address] [place] [address] [phone]        Martinique, Peter M, MD Follow up.   Specialty:  Cardiology Why:  call for appointment Contact information: [address] [place] [address] [phone]        Erline Levine, MD Follow up.   Specialty:  Neurosurgery Why:  call for appointment 2 weeks Contact information: [address] [place] [address] [phone]         Sindy Guadeloupe, MD Follow up.   Specialty:  Oncology Why:  call for appointment to discuss resuming chemotherapy Contact information: [place] [address] [phone]           Signed: Lavon Paganini [place] 10/24/2018, 1:45 PM

## 2018-10-24 NOTE — Progress Notes (Addendum)
[facility] PHYSICAL MEDICINE & REHABILITATION PROGRESS NOTE   Subjective/Complaints: Feels a little nauseas this morning. Anxious to be home. Going to try to eat breakfast  ROS: Patient denies fever, rash, sore throat, blurred vision,  vomiting, diarrhea, cough, shortness of breath or chest pain, joint or back pain, headache, or mood change.   Objective:   No results found. Recent Labs    10/24/18 0947  WBC 9.6  HGB 9.6*  HCT 29.0*  PLT 191   Recent Labs    10/24/18 0947  NA 143  K 3.7  CL 113*  CO2 23  GLUCOSE 154*  BUN 20  CREATININE 0.91  CALCIUM 9.0    Intake/Output Summary (Last 24 hours) at 10/24/2018 1224 Last data filed at 10/24/2018 0757 Gross per 24 hour  Intake 320 ml  Output 800 ml  Net -480 ml     Physical Exam: Vital Signs Blood pressure 118/76, pulse 72, temperature 99.5 F (37.5 C), temperature source Oral, resp. rate 16, height 5\' 8"  (1.727 m), weight 66.6 kg, SpO2 98 %. Constitutional: No distress . Vital signs reviewed. HEENT: EOMI, oral membranes moist Neck: supple Cardiovascular: RRR without murmur. No JVD    Respiratory: CTA Bilaterally without wheezes or rales. Normal effort    GI: BS +, non-tender, non-distended  Musc: No edema or tenderness in extremities. Neurological: Alert Fair insight and awareness.  Motor: Grossly 4+/5 throughout, stable Skin: Skin is warm and dry.  Scattered bruising healing Psychiatric: pleasant  Assessment/Plan: 1. Functional deficits secondary to traumatic SAH which require 3+ hours per day of interdisciplinary therapy in a comprehensive inpatient rehab setting.  Physiatrist is providing close team supervision and 24 hour management of active medical problems listed below.  Physiatrist and rehab team continue to assess barriers to discharge/monitor patient progress toward functional and medical goals  Care Tool:  Bathing  Bathing activity did not occur: Refused Body parts bathed by patient: Right arm,  Left arm, Chest, Abdomen, Front perineal area, Buttocks, Right upper leg, Left upper leg, Right lower leg, Left lower leg, Face         Bathing assist Assist Level: Supervision/Verbal cueing     Upper Body Dressing/Undressing Upper body dressing   What is the patient wearing?: Pull over shirt    Upper body assist Assist Level: Supervision/Verbal cueing    Lower Body Dressing/Undressing Lower body dressing      What is the patient wearing?: Underwear/pull up, Pants     Lower body assist Assist for lower body dressing: Supervision/Verbal cueing     Toileting Toileting    Toileting assist Assist for toileting: Supervision/Verbal cueing     Transfers Chair/bed transfer  Transfers assist     Chair/bed transfer assist level: Supervision/Verbal cueing     Locomotion Ambulation   Ambulation assist      Assist level: Contact Guard/Touching assist Assistive device: Walker-rolling Max distance: 48 ft   Walk 10 feet activity   Assist     Assist level: Contact Guard/Touching assist Assistive device: Walker-rolling   Walk 50 feet activity   Assist    Assist level: Contact Guard/Touching assist Assistive device: Walker-rolling    Walk 150 feet activity   Assist Walk 150 feet activity did not occur: Safety/medical concerns(fatigue )         Walk 10 feet on uneven surface  activity   Assist     Assist level: Minimal Assistance - Patient > 75% Assistive device: Chemical engineer  Assist Will patient use wheelchair at discharge?: No Type of Wheelchair: Manual    Wheelchair assist level: Supervision/Verbal cueing Max wheelchair distance: 50 ft    Wheelchair 50 feet with 2 turns activity    Assist        Assist Level: Supervision/Verbal cueing   Wheelchair 150 feet activity     Assist     Assist Level: Supervision/Verbal cueing    Medical Problem List and Plan: 1.  Decreased functional mobility  secondary to traumatic SAH left sylvian fissure, left temporal, right frontal            Continue CIR  Dc tomorrow?  -Patient to see Rehab MD/provider in the office for transitional care encounter in 2-4 weeks.  2.  Antithrombotics: -DVT/anticoagulatio SCn:Ds             -antiplatelet therapy: Chronic Plavix and Eliquis held due to [facility] 3. Pain Management: Hydrocodone and Flexeril as needed  -ice to coccyx  4. Mood: Zoloft 100 mg daily             -antipsychotic agents: N/A 5. Neuropsych: This patient is?  Fully capable of making decisions on her own behalf. 6. Skin/Wound Care: Routine skin checks 7. Fluids/Electrolytes/Nutrition:  -continue encourage PO  -protein supp for low albumin   -prealbumin 13.2  -BUN improved to 20 today   -1/2ns at 50cc/hr IVF at [facility], DC'd on 5/3  - megace for appetite  -appreciate RD assistance  Variable p.o. intake 8.  Hypertension.  Lopressor 25 mg twice daily, Cozaar 100 mg daily, Norvasc 2.5 mg daily had been held due to hypotension  Vitals:   10/24/18 0423 10/24/18 1116  BP: 115/62 118/76  Pulse: 86 72  Resp: 16   Temp: 99.5 F (37.5 C)   SpO2: 98%   --TEDS/abd binders  -consider midodrine trial if she remains symptomatic -hydration   Overall improved  9.  CAD with stenting.  Plavix currently on hold. 10.  Diastolic congestive heart failure.    Filed Weights   10/22/18 0450 10/23/18 0451 10/24/18 0423  Weight: 64.9 kg 65.9 kg 66.6 kg   Increased today? 11.  PAF.  Cardiac rate controlled on Lopressor.  Eliquis on hold.    12.  Ovarian cancer.  Follow-up outpatient.chemotherapy currently on hold until rehabilitation completed for [facility] 13.  Diet-controlled diabetes mellitus.  Patient currently on a regular diet.   No need to check CBGs on a regular basis   14.  Hyperlipidemia.  Zetia/Lipitor 15.  Anemia: mild iron deficiency, more related to chronic disease  Continue iron supplement for now  Hemoglobin 9.6 today   -stool heme negative  -no  signs of gross blood loss    LOS: 12 days A FACE TO FACE EVALUATION WAS PERFORMED  Meredith Staggers 10/24/2018, 12:24 PM

## 2018-10-24 NOTE — Progress Notes (Signed)
Occupational Therapy Discharge Summary  Patient Details  Name: Jennafer Gladue MRN: [id number] Date of Birth: 12-23-1940  Today's Date: 10/24/2018 OT Individual Time: 4801-6553 OT Individual Time Calculation (min): 31 min    Patient has met 8 of 10 long term goals due to improved activity tolerance, improved balance, postural control, ability to compensate for deficits, improved attention, improved awareness and improved coordination.  Patient to discharge at overall Supervision level.  Patient's care partner is independent to provide the necessary physical and cognitive assistance at discharge.    Reasons goals not met: Pt has not met her attention and daily memory goal. Per discussion with daughter, pt is at baseline with cognitive functioning and will have 24/7 supervision at home.   Recommendation:  Patient will benefit from ongoing skilled OT services in home health setting to continue to advance functional skills in the area of BADL.  Equipment: BSC  Reasons for discharge: treatment goals met and discharge from hospital  Patient/family agrees with progress made and goals achieved: Yes   Skilled OT Intervention:  Session focused on d/c planning and ADL transfers. Pt received bed level with c/o fatigue. With encouragement pt completed functional mobility with (S) to w/c, 10 ft. Pt was brought down to ADL apartment in w/c. Edu and demo provided re safety in transferring into shower and use of built in shower seat. Pt returned demo with close (S), cueing for RW management. Pt returned to room and further discussion re importance of daily activity. Pt left supine with all needs met, bed alarm set.   OT Discharge Precautions/Restrictions  Precautions Precautions: Fall Precaution Comments: orthostatic hypotension Restrictions Weight Bearing Restrictions: No Pain Pain Assessment Pain Scale: 0-10 Pain Score: 0-No pain ADL ADL Eating: Set up Where Assessed-Eating: Edge of  bed Grooming: Supervision/safety Where Assessed-Grooming: Edge of bed Upper Body Bathing: Supervision/safety Where Assessed-Upper Body Bathing: Edge of bed Lower Body Bathing: Supervision/safety Where Assessed-Lower Body Bathing: Edge of bed Upper Body Dressing: Supervision/safety Where Assessed-Upper Body Dressing: Edge of bed Lower Body Dressing: Supervision/safety Where Assessed-Lower Body Dressing: Edge of bed Toileting: Supervision/safety Where Assessed-Toileting: Glass blower/designer: Close supervision Toilet Transfer Method: Counselling psychologist: Energy manager: Close supervision Social research officer, government Method: Heritage manager: Grab bars Vision Baseline Vision/History: Wears glasses Wears Glasses: Reading only Patient Visual Report: No change from baseline Vision Assessment?: No apparent visual deficits Perception  Perception: Within Functional Limits Praxis Praxis: Intact Cognition Overall Cognitive Status: History of cognitive impairments - at baseline Arousal/Alertness: Awake/alert Orientation Level: Oriented to person;Oriented to place;Oriented to time;Oriented to situation Attention: Sustained Focused Attention: Appears intact Sustained Attention: Impaired Sustained Attention Impairment: Verbal complex;Functional complex Memory: Impaired Memory Impairment: Decreased recall of new information Awareness: Impaired Awareness Impairment: Emergent impairment Problem Solving: Impaired Problem Solving Impairment: Verbal complex;Functional complex Executive Function: Initiating Initiating: Impaired Initiating Impairment: Functional complex;Verbal complex Behaviors: Impulsive Safety/Judgment: Impaired Sensation Sensation Light Touch: Appears Intact(reports some sensory changes from chemo but all intact via testing) Hot/Cold: Appears Intact Proprioception: Appears Intact Coordination Gross Motor Movements  are Fluid and Coordinated: No Fine Motor Movements are Fluid and Coordinated: Yes Coordination and Movement Description: generalized weakness Finger Nose Finger Test: [facility] Heel Shin Test: decreased coordination LLE, BLE limited by weakness Motor  Motor Motor: Abnormal postural alignment and control Motor - Skilled Clinical Observations: generalized weakness Motor - Discharge Observations: generalized weakness & deconditioning Mobility  Bed Mobility Bed Mobility: Rolling Right;Rolling Left;Supine to Sit;Sit to Supine Rolling Right: Supervision/verbal cueing Rolling  Left: Supervision/Verbal cueing Supine to Sit: Supervision/Verbal cueing Sit to Supine: Supervision/Verbal cueing Transfers Sit to Stand: Supervision/Verbal cueing Stand to Sit: Supervision/Verbal cueing  Trunk/Postural Assessment  Cervical Assessment Cervical Assessment: Exceptions to WFL(forward head) Thoracic Assessment Thoracic Assessment: Exceptions to WFL(rounded shoulders) Lumbar Assessment Lumbar Assessment: Exceptions to WFL(posterior pelvic tilt) Postural Control Postural Control: Deficits on evaluation Righting Reactions: delayed Protective Responses: Delayed  Balance Balance Balance Assessed: Yes Static Sitting Balance Static Sitting - Balance Support: Feet supported;No upper extremity supported Static Sitting - Level of Assistance: 6: Modified independent (Device/Increase time) Dynamic Sitting Balance Dynamic Sitting - Balance Support: During functional activity;Feet supported Dynamic Sitting - Level of Assistance: 5: Stand by assistance Static Standing Balance Static Standing - Balance Support: Bilateral upper extremity supported;During functional activity Static Standing - Level of Assistance: 5: Stand by assistance Dynamic Standing Balance Dynamic Standing - Balance Support: Bilateral upper extremity supported;During functional activity Dynamic Standing - Level of Assistance: 5: Stand by  assistance Extremity/Trunk Assessment RUE Assessment RUE Assessment: Within Functional Limits General Strength Comments: generalized weakness, but expected for age and condition LUE Assessment LUE Assessment: Within Functional Limits General Strength Comments: generalized weakness, but expected for age and condition   Curtis Sites 10/24/2018, 1:33 PM

## 2018-10-24 NOTE — Progress Notes (Signed)
Routine line care: Discussed plan with Margaret [name], Margaret [name]. Pt is scheduled for discharge. She has no IV meds or lab draws ordered. Port de-accessed since it is no longer being used.

## 2018-10-24 NOTE — Progress Notes (Signed)
Occupational Therapy Session Note  Patient Details  Name: Margaret [name] MRN: [id number] Date of Birth: 1940-12-11  Today's Date: 10/24/2018 OT Individual Time: 9584-4171 OT Individual Time Calculation (min): 70 min    Short Term Goals: Week 2:  OT Short Term Goal 1 (Week 2): Continue working toward established LTG  Skilled Therapeutic Interventions/Progress Updates:    Session focused on morning ADL routine and d/c planning. Handout provided re energy conservation strategies. Extensive review and examples provided re implementation at home. Briefly spoke with pt's husband and daughter re d/c. Discussed ordering of BSC for pt and its multiple uses as both shower chair and over commode. Pt transitioned EOB with (S) and completed UB bathing with (S). Pt completed LB bathing with frequent rest breaks but no more than close (S). Edu provided re importance of engagement in ADLs/IADLs at home on self efficacy and continued increase in activity tolerance. Pt completed 3/3 toileting tasks with (S). Pt returned to supine and was left with all needs met- encouraged consumption of protein shake.   Therapy Documentation Precautions:  Precautions Precautions: Fall Precaution Comments: low frustration tolerance  Restrictions Weight Bearing Restrictions: No   Therapy/Group: Individual Therapy  Curtis Sites 10/24/2018, 8:48 AM

## 2018-10-24 NOTE — Plan of Care (Signed)
  Problem: Consults Goal: RH BRAIN INJURY PATIENT EDUCATION Description Description: See Patient Education module for eduction specifics Outcome: Progressing   Problem: RH SKIN INTEGRITY Goal: RH STG SKIN FREE OF INFECTION/BREAKDOWN Description No new breakdown with min assist/cues   Outcome: Progressing   Problem: RH SAFETY Goal: RH STG ADHERE TO SAFETY PRECAUTIONS W/ASSISTANCE/DEVICE Description STG Adhere to Safety Precautions With Min Assistance/Device.  Outcome: Progressing

## 2018-10-24 NOTE — Progress Notes (Signed)
Pt stated during lab draw that she thinks she is going home tomorrow. Discussed with [place], LPN: discharge is not likely. Pt will need needle change today.

## 2018-10-24 NOTE — Progress Notes (Signed)
Physical Therapy Discharge Summary  Patient Details  Name: Margaret [name] MRN: [id number] Date of Birth: 1940/08/31  Today's Date: 10/24/2018 PT Individual Time: 7824-2353 PT Individual Time Calculation (min): 58 min    Patient has met 7 of 9 long term goals due to improved activity tolerance, improved balance, improved postural control and increased strength.  Patient to discharge at an ambulatory level supervision with RW.   Patient's care partner is independent to provide the necessary physical and cognitive assistance at discharge.  Reasons goals not met: pt did not meet ambulatory goals 2/2 poor endurance/activity tolerance & pt unable to ambulate long distances  Recommendation:  Patient will benefit from ongoing skilled PT services in home health setting to continue to advance safe functional mobility, address ongoing impairments in balance, endurance, strength, awareness, gait, stair negotiation, and minimize fall risk.  Equipment: RW, transport w/c  Reasons for discharge: treatment goals met and discharge from hospital  Patient/family agrees with progress made and goals achieved: Yes  Skilled PT Treatment: Pt received in bed & agreeable to tx. Pt denies pain & reports having an "upset stomach" this morning but has received meds. Pt completes bed mobility with bed flat, without rails with supervision. Pt already has thigh high teds donned & once sitting EOB therapist dons abdominal binder total assist to help with BP management. Pt transfers sit<>stand with supervision throughout session and stand pivot to w/c with RW & supervision. Pt is able to propel w/c with BUE in bouts of ~20 ft with supervision before stopping. Pt completes car transfer at sedan simulated height with supervision & RW and when negotiating ramp & mulch with RW requires min assist 2/2 LOB & fatigue. Pt continues to moan/groan throughout session but denies pain. BP after task = 118/76 mmHg (LUE, sitting), HR = 72 bpm.  Pt negotiates 12 steps (6" + 3") with B rails and supervision & ambulates up to 40 ft with RW & supervision with decreased gait speed, poor activity tolerance, L knee valgum, decreased step length & stride length. Pt utilized nu-step on level 2 x 8 minutes with all four extremities with task focusing on global strengthening & endurance training; pt requires >6 rest breaks to complete task despite education to pace herself. At end of session pt left sitting in w/c in room with all needs in reach & chair alarm donned.  PT Discharge Precautions/Restrictions Precautions Precautions: Fall Precaution Comments: orthostatic hypotension Restrictions Weight Bearing Restrictions: No  Pain Pt denies c/o pain.   Vision/Perception  Pt reports she wears glasses for reading only at baseline. Pt denies changes in baseline vision.  Perception WNL.   Cognition Overall Cognitive Status: Difficult to assess Orientation Level: Oriented to person;Oriented to place;Oriented to time;Disoriented to situation(oriented to month & year only, unable to recall reason for admission to hospital) Focused Attention: Appears intact Sustained Attention: Impaired Memory: Impaired Memory Impairment: Decreased recall of new information Awareness: Appears intact Awareness Impairment: Emergent impairment;Anticipatory impairment;Intellectual impairment Problem Solving: Impaired Behaviors: Impulsive Safety/Judgment: Impaired  Sensation Pt denies numbness/tingling in extremities. BLE sensation to light touch WNL. Attempted to test BLE proprioception but pt with poor sustained attention to task.  Coordination Gross Motor Movements are Fluid and Coordinated: No Fine Motor Movements are Fluid and Coordinated: Yes Heel Shin Test: decreased coordination LLE, BLE limited by weakness   Motor  Motor Motor: Abnormal postural alignment and control Motor - Skilled Clinical Observations: generalized weakness Motor - Discharge  Observations: generalized weakness & deconditioning   Mobility Bed  Mobility Bed Mobility: Rolling Right;Rolling Left;Supine to Sit;Sit to Supine Rolling Right: Supervision/verbal cueing Rolling Left: Supervision/Verbal cueing Supine to Sit: Supervision/Verbal cueing Sit to Supine: Supervision/Verbal cueing Transfers Transfers: Sit to Stand;Stand to Sit Sit to Stand: Supervision/Verbal cueing Stand to Sit: Supervision/Verbal cueing   Locomotion  Gait Ambulation: Yes Gait Assistance: Supervision/Verbal cueing Gait Distance (Feet): 50 Feet Assistive device: Rolling walker Gait Gait: Yes Gait Pattern: Impaired Gait Pattern: Decreased stride length;Decreased step length - right;Decreased step length - left(decreased heel strike BLE, L knee valgus) Gait velocity: decreased Stairs / Additional Locomotion Stairs: Yes Stairs Assistance: Supervision/Verbal cueing Stair Management Technique: Two rails Number of Stairs: 12 Height of Stairs: (6" + 3") Ramp: Minimal Assistance - Patient >75%(min assist ambulatory with RW) Wheelchair Mobility Wheelchair Mobility: Yes Wheelchair Assistance: Chartered loss adjuster: Both upper extremities Wheelchair Parts Management: Needs assistance Distance: 20 ft    Trunk/Postural Assessment  Cervical Assessment Cervical Assessment: Exceptions to WFL(forward head) Thoracic Assessment Thoracic Assessment: Exceptions to WFL(rounded shoulders) Lumbar Assessment Lumbar Assessment: Exceptions to WFL(posterior pelvic tilt) Postural Control Postural Control: Deficits on evaluation Righting Reactions: delayed Protective Responses: Delayed   Balance Balance Balance Assessed: Yes Static Sitting Balance Static Sitting - Balance Support: Feet supported;No upper extremity supported Static Sitting - Level of Assistance: 6: Modified independent (Device/Increase time) Dynamic Sitting Balance Dynamic Sitting - Balance Support:  During functional activity;Feet supported Dynamic Sitting - Level of Assistance: 5: Stand by assistance Dynamic Standing Balance Dynamic Standing - Balance Support: Bilateral upper extremity supported;During functional activity Dynamic Standing - Level of Assistance: 5: Stand by assistance   Extremity Assessment  Not formally assessed, all extremities WNL.    Waunita Schooner 10/24/2018, 1:19 PM

## 2018-10-24 NOTE — Progress Notes (Signed)
Social Work Patient ID: Margaret [name], female   DOB: 1940-09-30, 78 y.o.   MRN: [id number]   CSW learned that pt can be discharged tomorrow per Dr. Naaman Plummer.  CSW spoke with pt's husband and he is pleased that pt is coming home.  He did not understand why she stayed so long, just lying in bed, getting week.  CSW explained that she's not just been lying in bed, that she's been doing 3-4 hours of therapy a day and that is why she's been here to get stronger.  CSW also discussed HH and DME with pt.  CSW called pt's dtr, [name], and left her message re: pt's d/c.  CSW arranging [facility] and ordering DME.  CSW will continue to follow and assist as needed.

## 2018-10-24 NOTE — Progress Notes (Signed)
Patient ID: Margaret [name], female   DOB: [birth year], 78 y.o.   MRN: [id number]      Diagnosis codes:  E33.4D5W; I50.32 ; C56.9  Height:    5'8"            Weight:    66.6kg/146lbs        Patient suffers from [facility] and ovarian cancer which impair her ability to perform daily activities like toileting, feeding, grooming in the home.  A walker or cane will not resolve issue with performing activities of daily living.  A transport chair will allow pt to be able to perform these tasks.  Patient is not able to propel herself in a wheelchair due to general weakness.  Patient has a willing, able, and available caregiver to push her in a transport chair.

## 2018-10-25 DIAGNOSIS — S065X1S Traumatic subdural hemorrhage with loss of consciousness of 30 minutes or less, sequela: Secondary | ICD-10-CM

## 2018-10-25 MED ORDER — FERROUS SULFATE 325 (65 FE) MG PO TABS: 325.0000 mg | ORAL_TABLET | Freq: Every day | ORAL | 3 refills | Status: AC

## 2018-10-25 MED ORDER — CYCLOBENZAPRINE HCL 10 MG PO TABS: 10.0000 mg | ORAL_TABLET | Freq: Three times a day (TID) | ORAL | 0 refills | Status: AC | PRN

## 2018-10-25 MED ORDER — EZETIMIBE 10 MG PO TABS: ORAL_TABLET | ORAL | 5 refills | Status: AC

## 2018-10-25 MED ORDER — ONDANSETRON HCL 4 MG PO TABS: 4.0000 mg | ORAL_TABLET | Freq: Three times a day (TID) | ORAL | 0 refills | Status: AC | PRN

## 2018-10-25 MED ORDER — METOPROLOL TARTRATE 25 MG PO TABS: 25.0000 mg | ORAL_TABLET | Freq: Two times a day (BID) | ORAL | 0 refills | Status: AC

## 2018-10-25 MED ORDER — SERTRALINE HCL 100 MG PO TABS: 100.0000 mg | ORAL_TABLET | Freq: Every day | ORAL | 0 refills | Status: AC

## 2018-10-25 MED ORDER — ENSURE ENLIVE PO LIQD: 237.0000 mL | Freq: Two times a day (BID) | ORAL | 12 refills | Status: DC

## 2018-10-25 MED ORDER — LOSARTAN POTASSIUM 100 MG PO TABS: 100.0000 mg | ORAL_TABLET | Freq: Every day | ORAL | 0 refills | Status: AC

## 2018-10-25 MED ORDER — ATORVASTATIN CALCIUM 80 MG PO TABS: 80.0000 mg | ORAL_TABLET | Freq: Every day | ORAL | 0 refills | Status: AC

## 2018-10-25 MED ORDER — OMEPRAZOLE 20 MG PO CPDR: 20.0000 mg | DELAYED_RELEASE_CAPSULE | Freq: Every day | ORAL | 0 refills | Status: AC

## 2018-10-25 MED ORDER — ACETAMINOPHEN 325 MG PO TABS: 650.0000 mg | ORAL_TABLET | ORAL | Status: AC | PRN

## 2018-10-25 MED ORDER — HYDROCODONE-ACETAMINOPHEN 5-325 MG PO TABS: 1.0000 | ORAL_TABLET | ORAL | 0 refills | Status: AC | PRN

## 2018-10-25 MED ORDER — MEGESTROL ACETATE 400 MG/10ML PO SUSP: 400.0000 mg | Freq: Two times a day (BID) | ORAL | 0 refills | Status: AC

## 2018-10-25 MED ORDER — AMLODIPINE BESYLATE 2.5 MG PO TABS: 2.5000 mg | ORAL_TABLET | Freq: Every day | ORAL | 3 refills | Status: AC

## 2018-10-25 NOTE — Progress Notes (Signed)
[facility] PHYSICAL MEDICINE & REHABILITATION PROGRESS NOTE   Subjective/Complaints: Rested well. Anxious to go home. Ate a little better yesterday  ROS: Patient denies fever, rash, sore throat, blurred vision, nausea, vomiting, diarrhea, cough, shortness of breath or chest pain, joint or back pain, headache, or mood change. .   Objective:   No results found. Recent Labs    10/24/18 0947  WBC 9.6  HGB 9.6*  HCT 29.0*  PLT 191   Recent Labs    10/24/18 0947  NA 143  K 3.7  CL 113*  CO2 23  GLUCOSE 154*  BUN 20  CREATININE 0.91  CALCIUM 9.0    Intake/Output Summary (Last 24 hours) at 10/25/2018 2202 Last data filed at 10/25/2018 0730 Gross per 24 hour  Intake 580 ml  Output -  Net 580 ml     Physical Exam: Vital Signs Blood pressure 124/73, pulse 71, temperature 98.4 F (36.9 C), temperature source Oral, resp. rate 15, height 5\' 8"  (1.727 m), weight 63.8 kg, SpO2 98 %. Constitutional: No distress . Vital signs reviewed. HEENT: EOMI, oral membranes moist Neck: supple Cardiovascular: RRR without murmur. No JVD    Respiratory: CTA Bilaterally without wheezes or rales. Normal effort    GI: BS +, non-tender, non-distended   Musc: No edema or tenderness in extremities. Neurological: Alert Fair insight and awareness.  Motor: Grossly 4+/5 throughout, stable Skin: Skin is warm and dry.  Scattered bruising is healing Psychiatric: pleasant, distracted at times  Assessment/Plan: 1. Functional deficits secondary to traumatic SAH which require 3+ hours per day of interdisciplinary therapy in a comprehensive inpatient rehab setting.  Physiatrist is providing close team supervision and 24 hour management of active medical problems listed below.  Physiatrist and rehab team continue to assess barriers to discharge/monitor patient progress toward functional and medical goals  Care Tool:  Bathing  Bathing activity did not occur: Refused Body parts bathed by patient: Right  arm, Left arm, Chest, Abdomen, Front perineal area, Buttocks, Right upper leg, Left upper leg, Right lower leg, Left lower leg, Face         Bathing assist Assist Level: Supervision/Verbal cueing     Upper Body Dressing/Undressing Upper body dressing   What is the patient wearing?: Pull over shirt    Upper body assist Assist Level: Supervision/Verbal cueing    Lower Body Dressing/Undressing Lower body dressing      What is the patient wearing?: Underwear/pull up, Pants     Lower body assist Assist for lower body dressing: Supervision/Verbal cueing     Toileting Toileting    Toileting assist Assist for toileting: Supervision/Verbal cueing     Transfers Chair/bed transfer  Transfers assist     Chair/bed transfer assist level: Supervision/Verbal cueing     Locomotion Ambulation   Ambulation assist      Assist level: Supervision/Verbal cueing Assistive device: Walker-rolling Max distance: 40 ft   Walk 10 feet activity   Assist     Assist level: Supervision/Verbal cueing Assistive device: Walker-rolling   Walk 50 feet activity   Assist Walk 50 feet with 2 turns activity did not occur: Safety/medical concerns  Assist level: Contact Guard/Touching assist Assistive device: Walker-rolling    Walk 150 feet activity   Assist Walk 150 feet activity did not occur: Safety/medical concerns         Walk 10 feet on uneven surface  activity   Assist     Assist level: Minimal Assistance - Patient > 75% Assistive device: Walker-rolling  Wheelchair     Assist Will patient use wheelchair at discharge?: Yes Type of Wheelchair: Manual    Wheelchair assist level: Supervision/Verbal cueing Max wheelchair distance: 20 ft    Wheelchair 50 feet with 2 turns activity    Assist    Wheelchair 50 feet with 2 turns activity did not occur: Safety/medical concerns   Assist Level: Supervision/Verbal cueing   Wheelchair 150 feet activity      Assist Wheelchair 150 feet activity did not occur: Safety/medical concerns   Assist Level: Supervision/Verbal cueing    Medical Problem List and Plan: 1.  Decreased functional mobility secondary to traumatic SAH left sylvian fissure, left temporal, right frontal               Dc home today  -Patient to see Rehab MD/provider in the office for transitional care encounter in 2-4 weeks.  2.  Antithrombotics: -DVT/anticoagulatio SCDs             -antiplatelet therapy: Chronic Plavix and Eliquis held due to [facility] 3. Pain Management: Hydrocodone and Flexeril as needed  -ice to coccyx prn 4. Mood: Zoloft 100 mg daily             -antipsychotic agents: N/A 5. Neuropsych: This patient is?  Fully capable of making decisions on her own behalf. 6. Skin/Wound Care: Routine skin checks 7. Fluids/Electrolytes/Nutrition:  -continue encourage PO  -protein supp for low albumin   -prealbumin 13.2  -BUN improved to 20     -  megace for appetite--continue as outpt  -appreciate RD assistance  Variable p.o. intake, ate better yesterday  -will need supervision and encouragement at home 8.  Hypertension.  Lopressor 25 mg twice daily, Cozaar 100 mg daily, Norvasc 2.5 mg daily had been held due to hypotension  Vitals:   10/24/18 1934 10/25/18 0514  BP: 120/73 124/73  Pulse: 66 71  Resp: 15 15  Temp: 98.8 F (37.1 C) 98.4 F (36.9 C)  SpO2: 100% 98%  --TEDS/abd binders  -consider midodrine trial if she remains symptomatic -hydration   Overall improved 5/5 9.  CAD with stenting.  Plavix currently on hold. 10.  Diastolic congestive heart failure.    Filed Weights   10/23/18 0451 10/24/18 0423 10/25/18 0514  Weight: 65.9 kg 66.6 kg 63.8 kg   Weight stable 11.  PAF.  Cardiac rate controlled on Lopressor.  Eliquis on hold.    12.  Ovarian cancer.  Follow-up outpatient.chemotherapy currently on hold until rehabilitation completed for [facility] 13.  Diet-controlled diabetes mellitus.  Patient  currently on a regular diet.   No need to check CBGs on a regular basis   14.  Hyperlipidemia.  Zetia/Lipitor 15.  Anemia: mild iron deficiency, more related to chronic disease  Continue iron supplement for now  Hemoglobin 9.6     -stool heme negative  -no signs of gross blood loss    LOS: 13 days A FACE TO FACE EVALUATION WAS PERFORMED  Meredith Staggers 10/25/2018, 8:32 AM

## 2018-10-25 NOTE — Discharge Instructions (Signed)
Inpatient Rehab Discharge Instructions  Margaret [name] Discharge date and time: No discharge date for patient encounter.   Activities/Precautions/ Functional Status: Activity: activity as tolerated Diet: regular diet Wound Care: none needed Functional status:  ___ No restrictions     ___ Walk up steps independently ___ 24/7 supervision/assistance   ___ Walk up steps with assistance ___ Intermittent supervision/assistance  ___ Bathe/dress independently ___ Walk with walker     _x__ Bathe/dress with assistance ___ Walk Independently    ___ Shower independently ___ Walk with assistance    ___ Shower with assistance ___ No alcohol     ___ Return to work/school ________  COMMUNITY REFERRALS UPON DISCHARGE:   Home Health:   PT     OT     SW     Agency:  [facility]:  [phone] Medical Equipment/Items Ordered:  Systems analyst; Musician:  [employer]       Phone:  [phone]  GENERAL COMMUNITY RESOURCES FOR PATIENT/FAMILY: Support Groups:  [place] [facility] - on hold for COVD-19                              Meets the 3rd Tuesday of each month 12:15 - 1pm                              [facility] [address]                              For information or to register, call [phone] Other:  Dr. Ilean Skill, [facility] [address] [place], [place]  [address]  [phone]  Special Instructions: No driving  Hold on resuming Plavix and ELIQUIS until discussed with neurosurgery Dr. Vertell Limber and cardiology services Dr. Martinique  Follow-up with oncology services on timing of resuming chemotherapy   My questions have been answered and I understand these instructions. I will adhere to these goals and the provided educational materials after my discharge from the hospital.  Patient/Caregiver Signature  _______________________________ Date __________  Clinician Signature _______________________________________ Date __________  Please bring this form and your medication list with you to all your follow-up doctor's appointments.

## 2018-10-25 NOTE — Progress Notes (Signed)
Social Work Discharge Note  The overall goal for the admission was met for:   Discharge location: Yes - home with husband and dtr  Length of Stay: Yes - 13 days  Discharge activity level: Yes - supervision  Home/community participation: Yes  Services provided included: MD, RD, PT, OT, SLP, RN, Pharmacy, Neuropsych and SW  Financial Services: Private Insurance: [employer]  Follow-up services arranged: Home Health: PT/OT/SW/CNA from [facility], DME: transport chair and rolling walker from [employer] and Patient/Family has no preference for HH/DME agencies  Comments (or additional information): [name] and husband came in for family education prior to d/c and feel prepared to care for pt at home.  Patient/Family verbalized understanding of follow-up arrangements: Yes  Individual responsible for coordination of the follow-up plan: pt's husband and dtr Jesi Jurgens - husband - [phone] Barrett Henle - dtr - [phone]  Confirmed correct DME delivered: Trey Sailors 10/25/2018    Claudetta Sallie, Silvestre Mesi

## 2018-10-25 NOTE — Progress Notes (Signed)
Pt DC home today. Daughter awaiting at main lobby. Belongings packed. Pt transported to main lobby via NT and SW to complete DC outside with Daughter. R chest port deaccessed.   Erie Noe, LPN

## 2018-10-25 NOTE — Plan of Care (Signed)
  Problem: Consults Goal: RH BRAIN INJURY PATIENT EDUCATION Description Description: See Patient Education module for eduction specifics Outcome: Completed/Met   Problem: RH SKIN INTEGRITY Goal: RH STG SKIN FREE OF INFECTION/BREAKDOWN Description No new breakdown with min assist/cues   Outcome: Completed/Met   Problem: RH SAFETY Goal: RH STG ADHERE TO SAFETY PRECAUTIONS W/ASSISTANCE/DEVICE Description STG Adhere to Safety Precautions With Min Assistance/Device.  Outcome: Completed/Met   Problem: RH PAIN MANAGEMENT Goal: RH STG PAIN MANAGED AT OR BELOW PT'S PAIN GOAL Description < 3 out of 10.   Outcome: Completed/Met

## 2018-10-26 DIAGNOSIS — I2510 Atherosclerotic heart disease of native coronary artery without angina pectoris: Secondary | ICD-10-CM | POA: Diagnosis not present

## 2018-10-26 DIAGNOSIS — I110 Hypertensive heart disease with heart failure: Secondary | ICD-10-CM | POA: Diagnosis not present

## 2018-10-26 DIAGNOSIS — E119 Type 2 diabetes mellitus without complications: Secondary | ICD-10-CM | POA: Diagnosis not present

## 2018-10-26 DIAGNOSIS — C569 Malignant neoplasm of unspecified ovary: Secondary | ICD-10-CM | POA: Diagnosis not present

## 2018-10-26 DIAGNOSIS — Z6822 Body mass index (BMI) 22.0-22.9, adult: Secondary | ICD-10-CM | POA: Diagnosis not present

## 2018-10-26 DIAGNOSIS — Z85818 Personal history of malignant neoplasm of other sites of lip, oral cavity, and pharynx: Secondary | ICD-10-CM | POA: Diagnosis not present

## 2018-10-26 DIAGNOSIS — F329 Major depressive disorder, single episode, unspecified: Secondary | ICD-10-CM | POA: Diagnosis not present

## 2018-10-26 DIAGNOSIS — K219 Gastro-esophageal reflux disease without esophagitis: Secondary | ICD-10-CM | POA: Diagnosis not present

## 2018-10-26 DIAGNOSIS — Z8673 Personal history of transient ischemic attack (TIA), and cerebral infarction without residual deficits: Secondary | ICD-10-CM | POA: Diagnosis not present

## 2018-10-26 DIAGNOSIS — Z9181 History of falling: Secondary | ICD-10-CM | POA: Diagnosis not present

## 2018-10-26 DIAGNOSIS — D509 Iron deficiency anemia, unspecified: Secondary | ICD-10-CM | POA: Diagnosis not present

## 2018-10-26 DIAGNOSIS — F419 Anxiety disorder, unspecified: Secondary | ICD-10-CM | POA: Diagnosis not present

## 2018-10-26 DIAGNOSIS — G4700 Insomnia, unspecified: Secondary | ICD-10-CM | POA: Diagnosis not present

## 2018-10-26 DIAGNOSIS — Z87891 Personal history of nicotine dependence: Secondary | ICD-10-CM | POA: Diagnosis not present

## 2018-10-26 DIAGNOSIS — I5030 Unspecified diastolic (congestive) heart failure: Secondary | ICD-10-CM | POA: Diagnosis not present

## 2018-10-26 DIAGNOSIS — Z86010 Personal history of colonic polyps: Secondary | ICD-10-CM | POA: Diagnosis not present

## 2018-10-26 DIAGNOSIS — I252 Old myocardial infarction: Secondary | ICD-10-CM | POA: Diagnosis not present

## 2018-10-26 DIAGNOSIS — I480 Paroxysmal atrial fibrillation: Secondary | ICD-10-CM | POA: Diagnosis not present

## 2018-10-26 DIAGNOSIS — S066X0D Traumatic subarachnoid hemorrhage without loss of consciousness, subsequent encounter: Secondary | ICD-10-CM | POA: Diagnosis not present

## 2018-10-26 DIAGNOSIS — E785 Hyperlipidemia, unspecified: Secondary | ICD-10-CM | POA: Diagnosis not present

## 2018-10-26 DIAGNOSIS — Z9884 Bariatric surgery status: Secondary | ICD-10-CM | POA: Diagnosis not present

## 2018-11-01 ENCOUNTER — Telehealth: Payer: Self-pay | Admitting: Registered Nurse

## 2018-11-01 NOTE — Telephone Encounter (Signed)
Transitional Care call Transitional Questions Answered  Patient name: Margaret [name]  DOB: 1940/12/10 1. Are you/is patient experiencing any problems since coming home? No a. Are there any questions regarding any aspect of care? No 2. Are there any questions regarding medications administration/dosing? No a. Are meds being taken as prescribed? Yes b. "Patient should review meds with caller to confirm" Medication List Reviewed 3. Have there been any falls? No 4. Has Home Health been to the house and/or have they contacted you? Yes, [facility] a. If not, have you tried to contact them? Na b. Can we help you contact them? No 5. Are bowels and bladder emptying properly? Yes, Margaret [name] reports Margaret [name] had a bout of diarrhea this morning and her stool was dark. She's  Currently on Iron supplement, he was instructed to observe bowel movements. Nurse is scheduled to come to their home today, was instructed to have nurse to call our office, he verbalizes understanding.  a. Are there any unexpected incontinence issues? NA b. If applicable, is patient following bowel/bladder programs? NA 6. Any fevers, problems with breathing, unexpected pain? No 7. Are there any skin problems or new areas of breakdown? No 8. Has the patient/family member arranged specialty MD follow up (ie cardiology/neurology/renal/surgical/etc.)?  HFU appointments have been scheduled except for Dr. Vertell Limber. He was instructed to call Dr. Vertell Limber office, telephone number and address given to Margaret [name] to schedule HFU appointment he verbalize understanding a. Can we help arrange? NA 9. Does the patient need any other services or support that we can help arrange? No 10. Are caregivers following through as expected in assisting the patient? Yes 11. Has the patient quit smoking, drinking alcohol, or using drugs as recommended? Margaret [name] reports Margaret [name] doesn't smoke, drink alcohol or use illicit drugs.   Appointment date/time 11/07/2018  arrival time 1:20 for 1:40 appointment with Denzil Hughes ANP. [place]

## 2018-11-02 ENCOUNTER — Telehealth: Payer: Self-pay | Admitting: *Deleted

## 2018-11-02 NOTE — Telephone Encounter (Signed)
Patient has referral to see Dr. Nicolasa Ducking for depression. I got a call today from the office stating they have the referral but there is conflict of interest and cannot in good faith take patient for services. I called back and spoke to staff and they told me there was conflict of interest and could not say exactly why. The referral is 3 months ago and since that time she had a major stroke and in in patient rehab for several weeks.  She is coming to our office this week and we can determine if she still needs consult or if she is able to go to appts if made for her.

## 2018-11-03 ENCOUNTER — Ambulatory Visit: Payer: PPO

## 2018-11-04 ENCOUNTER — Telehealth: Payer: Self-pay | Admitting: *Deleted

## 2018-11-04 ENCOUNTER — Telehealth: Payer: PPO | Admitting: Physician Assistant

## 2018-11-04 ENCOUNTER — Inpatient Hospital Stay: Payer: PPO | Admitting: Oncology

## 2018-11-04 ENCOUNTER — Encounter: Payer: Self-pay | Admitting: Emergency Medicine

## 2018-11-04 ENCOUNTER — Telehealth: Payer: Self-pay | Admitting: Physician Assistant

## 2018-11-04 ENCOUNTER — Inpatient Hospital Stay
Admission: EM | Admit: 2018-11-04 | Discharge: 2018-11-07 | DRG: 690 | Disposition: A | Discharge status: Home Health | Payer: PPO | Attending: Internal Medicine | Admitting: Internal Medicine

## 2018-11-04 ENCOUNTER — Telehealth: Payer: Self-pay

## 2018-11-04 ENCOUNTER — Other Ambulatory Visit: Payer: Self-pay

## 2018-11-04 ENCOUNTER — Inpatient Hospital Stay: Payer: PPO

## 2018-11-04 DIAGNOSIS — Z96642 Presence of left artificial hip joint: Secondary | ICD-10-CM | POA: Diagnosis not present

## 2018-11-04 DIAGNOSIS — I252 Old myocardial infarction: Secondary | ICD-10-CM | POA: Diagnosis not present

## 2018-11-04 DIAGNOSIS — I5042 Chronic combined systolic (congestive) and diastolic (congestive) heart failure: Secondary | ICD-10-CM | POA: Diagnosis present

## 2018-11-04 DIAGNOSIS — N390 Urinary tract infection, site not specified: Secondary | ICD-10-CM | POA: Diagnosis not present

## 2018-11-04 DIAGNOSIS — Z8543 Personal history of malignant neoplasm of ovary: Secondary | ICD-10-CM | POA: Diagnosis not present

## 2018-11-04 DIAGNOSIS — Z91048 Other nonmedicinal substance allergy status: Secondary | ICD-10-CM

## 2018-11-04 DIAGNOSIS — Z79899 Other long term (current) drug therapy: Secondary | ICD-10-CM

## 2018-11-04 DIAGNOSIS — Z1159 Encounter for screening for other viral diseases: Secondary | ICD-10-CM | POA: Diagnosis not present

## 2018-11-04 DIAGNOSIS — Z9884 Bariatric surgery status: Secondary | ICD-10-CM

## 2018-11-04 DIAGNOSIS — Z79891 Long term (current) use of opiate analgesic: Secondary | ICD-10-CM

## 2018-11-04 DIAGNOSIS — Z86010 Personal history of colonic polyps: Secondary | ICD-10-CM | POA: Diagnosis not present

## 2018-11-04 DIAGNOSIS — F329 Major depressive disorder, single episode, unspecified: Secondary | ICD-10-CM | POA: Diagnosis not present

## 2018-11-04 DIAGNOSIS — I110 Hypertensive heart disease with heart failure: Secondary | ICD-10-CM | POA: Diagnosis not present

## 2018-11-04 DIAGNOSIS — E785 Hyperlipidemia, unspecified: Secondary | ICD-10-CM | POA: Diagnosis present

## 2018-11-04 DIAGNOSIS — Z87891 Personal history of nicotine dependence: Secondary | ICD-10-CM

## 2018-11-04 DIAGNOSIS — Z800 Family history of malignant neoplasm of digestive organs: Secondary | ICD-10-CM

## 2018-11-04 DIAGNOSIS — R319 Hematuria, unspecified: Secondary | ICD-10-CM | POA: Diagnosis not present

## 2018-11-04 DIAGNOSIS — Z888 Allergy status to other drugs, medicaments and biological substances status: Secondary | ICD-10-CM

## 2018-11-04 DIAGNOSIS — R42 Dizziness and giddiness: Secondary | ICD-10-CM | POA: Diagnosis not present

## 2018-11-04 DIAGNOSIS — Z8673 Personal history of transient ischemic attack (TIA), and cerebral infarction without residual deficits: Secondary | ICD-10-CM | POA: Diagnosis not present

## 2018-11-04 DIAGNOSIS — E860 Dehydration: Secondary | ICD-10-CM | POA: Diagnosis not present

## 2018-11-04 DIAGNOSIS — Z8249 Family history of ischemic heart disease and other diseases of the circulatory system: Secondary | ICD-10-CM

## 2018-11-04 DIAGNOSIS — N3000 Acute cystitis without hematuria: Principal | ICD-10-CM | POA: Diagnosis present

## 2018-11-04 DIAGNOSIS — Z7401 Bed confinement status: Secondary | ICD-10-CM

## 2018-11-04 DIAGNOSIS — Z9049 Acquired absence of other specified parts of digestive tract: Secondary | ICD-10-CM

## 2018-11-04 DIAGNOSIS — Z03818 Encounter for observation for suspected exposure to other biological agents ruled out: Secondary | ICD-10-CM | POA: Diagnosis not present

## 2018-11-04 DIAGNOSIS — E119 Type 2 diabetes mellitus without complications: Secondary | ICD-10-CM | POA: Diagnosis present

## 2018-11-04 DIAGNOSIS — E876 Hypokalemia: Secondary | ICD-10-CM | POA: Diagnosis not present

## 2018-11-04 DIAGNOSIS — Z90710 Acquired absence of both cervix and uterus: Secondary | ICD-10-CM | POA: Diagnosis not present

## 2018-11-04 DIAGNOSIS — F419 Anxiety disorder, unspecified: Secondary | ICD-10-CM | POA: Diagnosis not present

## 2018-11-04 DIAGNOSIS — K219 Gastro-esophageal reflux disease without esophagitis: Secondary | ICD-10-CM | POA: Diagnosis not present

## 2018-11-04 DIAGNOSIS — I480 Paroxysmal atrial fibrillation: Secondary | ICD-10-CM | POA: Diagnosis not present

## 2018-11-04 DIAGNOSIS — Z833 Family history of diabetes mellitus: Secondary | ICD-10-CM

## 2018-11-04 DIAGNOSIS — I2510 Atherosclerotic heart disease of native coronary artery without angina pectoris: Secondary | ICD-10-CM | POA: Diagnosis present

## 2018-11-04 DIAGNOSIS — I951 Orthostatic hypotension: Secondary | ICD-10-CM | POA: Diagnosis present

## 2018-11-04 LAB — URINALYSIS, COMPLETE (UACMP) WITH MICROSCOPIC
Bilirubin Urine: NEGATIVE
Glucose, UA: NEGATIVE mg/dL
Hgb urine dipstick: NEGATIVE
Ketones, ur: 5 mg/dL — AB
Nitrite: NEGATIVE
Protein, ur: NEGATIVE mg/dL
Specific Gravity, Urine: 1.02 (ref 1.005–1.030)
pH: 5 (ref 5.0–8.0)

## 2018-11-04 LAB — CBC WITH DIFFERENTIAL/PLATELET
Abs Immature Granulocytes: 0.04 10*3/uL (ref 0.00–0.07)
Basophils Absolute: 0.1 10*3/uL (ref 0.0–0.1)
Basophils Relative: 1 %
Eosinophils Absolute: 0.1 10*3/uL (ref 0.0–0.5)
Eosinophils Relative: 1 %
HCT: 32.9 % — ABNORMAL LOW (ref 36.0–46.0)
Hemoglobin: 10.5 g/dL — ABNORMAL LOW (ref 12.0–15.0)
Immature Granulocytes: 0 %
Lymphocytes Relative: 19 %
Lymphs Abs: 1.7 10*3/uL (ref 0.7–4.0)
MCH: 31.1 pg (ref 26.0–34.0)
MCHC: 31.9 g/dL (ref 30.0–36.0)
MCV: 97.3 fL (ref 80.0–100.0)
Monocytes Absolute: 0.6 10*3/uL (ref 0.1–1.0)
Monocytes Relative: 6 %
Neutro Abs: 6.5 10*3/uL (ref 1.7–7.7)
Neutrophils Relative %: 73 %
Platelets: 210 10*3/uL (ref 150–400)
RBC: 3.38 MIL/uL — ABNORMAL LOW (ref 3.87–5.11)
RDW: 16 % — ABNORMAL HIGH (ref 11.5–15.5)
WBC: 9 10*3/uL (ref 4.0–10.5)
nRBC: 0 % (ref 0.0–0.2)

## 2018-11-04 LAB — COMPREHENSIVE METABOLIC PANEL
ALT: 15 U/L (ref 0–44)
AST: 24 U/L (ref 15–41)
Albumin: 3.5 g/dL (ref 3.5–5.0)
Alkaline Phosphatase: 160 U/L — ABNORMAL HIGH (ref 38–126)
Anion gap: 12 (ref 5–15)
BUN: 26 mg/dL — ABNORMAL HIGH (ref 8–23)
CO2: 19 mmol/L — ABNORMAL LOW (ref 22–32)
Calcium: 9.2 mg/dL (ref 8.9–10.3)
Chloride: 112 mmol/L — ABNORMAL HIGH (ref 98–111)
Creatinine, Ser: 0.93 mg/dL (ref 0.44–1.00)
GFR calc Af Amer: >60 mL/min (ref >60)
GFR calc non Af Amer: 58 mL/min — ABNORMAL LOW (ref >60)
Glucose, Bld: 93 mg/dL (ref 70–99)
Potassium: 3.4 mmol/L — ABNORMAL LOW (ref 3.5–5.1)
Sodium: 143 mmol/L (ref 135–145)
Total Bilirubin: 1.1 mg/dL (ref 0.3–1.2)
Total Protein: 6.1 g/dL — ABNORMAL LOW (ref 6.5–8.1)

## 2018-11-04 LAB — TROPONIN I: Troponin I: <0.03 ng/mL (ref <0.03)

## 2018-11-04 MED ORDER — CEPHALEXIN 250 MG PO CAPS: 250.0000 mg | ORAL_CAPSULE | Freq: Three times a day (TID) | ORAL | 0 refills | Status: DC

## 2018-11-04 MED ORDER — SERTRALINE HCL 50 MG PO TABS
100.0000 mg | ORAL_TABLET | Freq: Every day | ORAL | Status: DC
  Administered 2018-11-05 – 2018-11-07 (×3): 100 mg via ORAL
  Filled 2018-11-04 (×3): qty 2

## 2018-11-04 MED ORDER — CEPHALEXIN 500 MG PO CAPS
500.0000 mg | ORAL_CAPSULE | Freq: Three times a day (TID) | ORAL | Status: DC
  Administered 2018-11-05 (×2): 500 mg via ORAL
  Filled 2018-11-04 (×2): qty 1

## 2018-11-04 MED ORDER — SODIUM CHLORIDE 0.9 % IV BOLUS
500.0000 mL | Freq: Once | INTRAVENOUS | Status: AC
  Administered 2018-11-04: 500 mL via INTRAVENOUS

## 2018-11-04 MED ORDER — MEGESTROL ACETATE 400 MG/10ML PO SUSP
400.0000 mg | Freq: Two times a day (BID) | ORAL | Status: DC
  Administered 2018-11-05 – 2018-11-06 (×4): 400 mg via ORAL
  Filled 2018-11-04 (×9): qty 10

## 2018-11-04 MED ORDER — AMLODIPINE BESYLATE 5 MG PO TABS
2.5000 mg | ORAL_TABLET | Freq: Every day | ORAL | Status: DC
  Administered 2018-11-05 – 2018-11-06 (×2): 2.5 mg via ORAL
  Filled 2018-11-04 (×2): qty 1

## 2018-11-04 MED ORDER — LOSARTAN POTASSIUM 50 MG PO TABS
100.0000 mg | ORAL_TABLET | Freq: Every day | ORAL | Status: DC
  Administered 2018-11-05 – 2018-11-06 (×2): 100 mg via ORAL
  Filled 2018-11-04 (×2): qty 2

## 2018-11-04 MED ORDER — SODIUM CHLORIDE 0.9 % IV SOLN
1.0000 g | Freq: Once | INTRAVENOUS | Status: AC
  Administered 2018-11-04: 1 g via INTRAVENOUS
  Filled 2018-11-04: qty 10

## 2018-11-04 MED ORDER — METOPROLOL TARTRATE 25 MG PO TABS
25.0000 mg | ORAL_TABLET | Freq: Two times a day (BID) | ORAL | Status: DC
  Administered 2018-11-05 – 2018-11-06 (×4): 25 mg via ORAL
  Filled 2018-11-04 (×4): qty 1

## 2018-11-04 MED ORDER — PANTOPRAZOLE SODIUM 40 MG PO TBEC
40.0000 mg | DELAYED_RELEASE_TABLET | Freq: Every day | ORAL | Status: DC
  Administered 2018-11-05 – 2018-11-07 (×3): 40 mg via ORAL
  Filled 2018-11-04 (×3): qty 1

## 2018-11-04 MED ORDER — ENSURE ENLIVE PO LIQD: 237.0000 mL | Freq: Two times a day (BID) | ORAL | Status: DC

## 2018-11-04 NOTE — ED Provider Notes (Addendum)
[facility] Emergency Department Provider Note ____________________________________________   First MD Initiated Contact with Patient 11/04/18 1014     (approximate)  I have reviewed the triage vital signs and the nursing notes.   HISTORY  Chief Complaint Dizziness    HPI Margaret [name] is a 78 y.o. female with PMH as noted below who presents with dizziness, acute onset this morning when she got up to leave her house, described as feeling "just dizzy" but on further questioning she states that there possibly is a mild component of spinning.  She did not feel like she was going to pass out.  The patient denies headache, any other pain, nausea or vomiting, fever, shortness of breath, or any other acute symptoms.  She states that the dizziness is somewhat better.  Past Medical History:  Diagnosis Date  . Acetabular fracture ([place])   . Anxiety and depression   . Bartholin cyst   . CAD (coronary artery disease)   . Cancer ([place])    mouth cyst was cancer many years ago   . Cellulitis of right leg 2005  . CHF (congestive heart failure) ([place])   . Colon polyp   . Complication of anesthesia   . Diabetes mellitus without complication ([place])   . DJD (degenerative joint disease)   . Dyspnea on exertion   . Dysrhythmia    PAF  . GERD (gastroesophageal reflux disease)   . Hyperlipidemia   . Hypertension   . Morbid obesity ([place])   . Myocardial infarction ([employer]) 06/14/2017  . Ovarian cancer ([place])   . PAF (paroxysmal atrial fibrillation) ([place])    a. diagnosed 7/19; b. CHADS2VASc 8; c. Eliquis  . PONV (postoperative nausea and vomiting)   . Presence of stent in right coronary artery 02/08/12   2.5x18 Xience distal RCA  . Right shoulder pain   . Stroke ([place]) 2017  . Vaginal prolapse     Patient Active Problem List   Diagnosis Date Noted  . Hypokalemia   . Chronic combined systolic and diastolic congestive heart failure ([place])   . Anemia of chronic disease   .  Acute combined systolic and diastolic congestive heart failure ([place])   . Traumatic subdural hematoma ([place]) 10/12/2018  . Acute blood loss anemia   . Diabetes mellitus type 2 in nonobese (HCC)   . Malignant neoplasm of ovary ([place])   . PAF (paroxysmal atrial fibrillation) ([place])   . Chronic diastolic congestive heart failure ([place])   . Coronary artery disease involving native coronary artery of native heart without angina pectoris   . Subarachnoid hemorrhage ([place]) 10/08/2018  . Right ovarian cyst 08/12/2018  . Accelerated hypertension 07/31/2018  . Elevated lactic acid level 07/31/2018  . Hypertensive urgency 07/31/2018  . Goals of care, counseling/discussion 07/22/2018  . Primary high grade serous adenocarcinoma of ovary ([place]) 07/13/2018  . Controlled type 2 diabetes mellitus without complication, without long-term current use of insulin ([place]) 02/02/2018  . Bradycardia 01/13/2018  . Essential hypertension 11/04/2017  . Other hyperlipidemia 11/04/2017  . Hx of gastric bypass 11/04/2017  . NSTEMI (non-ST elevated myocardial infarction) ([place]) 06/14/2017  . History of ischemic right PCA stroke 03/27/2016  . Hemianopia, homonymous, left 01/31/2016  . Stroke ([place]) 11/06/2015  . Arthritis of knee, degenerative 05/04/2013  . Diastolic dysfunction 23/55/7322  . CAD S/P percutaneous coronary angioplasty   . Morbid obesity ([place])   . Right shoulder pain   . Anxiety and depression   . Dyspnea on exertion   .  Cellulitis of right leg     Past Surgical History:  Procedure Laterality Date  . ABDOMINAL HYSTERECTOMY     supracervical abdominal w/removal tubes &/or ovaries  . BREAST BIOPSY  1989  . CHOLECYSTECTOMY    . COLONOSCOPY    . COLONOSCOPY WITH PROPOFOL N/A 06/21/2018   Procedure: COLONOSCOPY WITH PROPOFOL;  Surgeon: Lollie Sails, MD;  [facility];  Service: Endoscopy;  Laterality: N/A;  . CORONARY STENT INTERVENTION N/A 06/14/2017   Procedure: CORONARY STENT  INTERVENTION;  Surgeon: Wellington Hampshire, MD;  [facility];  Service: Cardiovascular;  Laterality: N/A;  . ESOPHAGOGASTRODUODENOSCOPY N/A 06/21/2018   Procedure: ESOPHAGOGASTRODUODENOSCOPY (EGD);  Surgeon: Lollie Sails, MD;  Location: [facility] ENDOSCOPY;  Service: Endoscopy;  Laterality: N/A;  . FACIAL COSMETIC SURGERY     face/neck lift  . FRACTURE SURGERY    . GASTRIC BYPASS  2008  . JOINT REPLACEMENT    . KNEE SURGERY     left knee arthroscopic  . LAPAROSCOPIC BILATERAL SALPINGO OOPHERECTOMY Bilateral 06/24/2018   Procedure: LAPAROSCOPIC BILATERAL SALPINGO OOPHORECTOMY;  Surgeon: Schermerhorn, Gwen Her, MD;  Location: [facility] ORS;  Service: Gynecology;  Laterality: Bilateral;  . LEFT HEART CATH AND CORONARY ANGIOGRAPHY N/A 06/14/2017   Procedure: LEFT HEART CATH AND CORONARY ANGIOGRAPHY;  Surgeon: Wellington Hampshire, MD;  Location: [facility] LAB;  Service: Cardiovascular;  Laterality: N/A;  . LEFT HEART CATH AND CORONARY ANGIOGRAPHY N/A 06/14/2017   Procedure: LEFT HEART CATH AND CORONARY ANGIOGRAPHY;  Surgeon: Wellington Hampshire, MD;  Location: [facility] CV LAB;  Service: Cardiovascular;  Laterality: N/A;  . LYSIS OF ADHESION  06/24/2018   Procedure: LYSIS OF ADHESION;  Surgeon: Ouida Sills Gwen Her, MD;  Location: [facility] ORS;  Service: Gynecology;;  . Crisoforo [place]   left hip fracture/repair and left arm fracture  . OTHER SURGICAL HISTORY  1986   hysterectomy  . PORTA CATH INSERTION N/A 07/26/2018   Procedure: PORTA CATH INSERTION;  Surgeon: Katha Cabal, MD;  Location: [facility] CV LAB;  Service: Cardiovascular;  Laterality: N/A;  . right knee  late 90's   arthroscopic  . ROUX-EN-Y PROCEDURE    . SALIVARY GLAND SURGERY    . TOTAL HIP ARTHROPLASTY  2004   left    Prior to Admission medications   Medication Sig Start Date End Date Taking? Authorizing Provider  acetaminophen (TYLENOL) 325 MG tablet Take 2 tablets (650 mg total) by mouth every 4 (four)  hours as needed for mild pain. 10/25/18   Angiulli, Lavon Paganini, PA-C  amLODipine (NORVASC) 2.5 MG tablet Take 1 tablet (2.5 mg total) by mouth daily. 10/25/18 10/20/19  Angiulli, Lavon Paganini, PA-C  atorvastatin (LIPITOR) 80 MG tablet Take 1 tablet (80 mg total) by mouth at bedtime. 10/25/18   Angiulli, Lavon Paganini, PA-C  cephALEXin (KEFLEX) 250 MG capsule Take 1 capsule (250 mg total) by mouth every 8 (eight) hours for 10 days. 11/04/18 11/14/18  Arta Silence, MD  cyanocobalamin (,VITAMIN B-12,) 1000 MCG/ML injection Inject 1,000 mcg into the muscle every 30 (thirty) days.    [provider]  cyclobenzaprine (FLEXERIL) 10 MG tablet Take 1 tablet (10 mg total) by mouth 3 (three) times daily as needed for muscle spasms. 10/25/18   Angiulli, Lavon Paganini, PA-C  ergocalciferol (VITAMIN D2) 1.25 MG (50000 UT) capsule Take 50,000 Units by mouth every Monday.    [provider]  ezetimibe (ZETIA) 10 MG tablet TAKE 1 TABLET(10 MG) BY MOUTH DAILY  10/25/18   Angiulli, Lavon Paganini, PA-C  feeding supplement, ENSURE ENLIVE, (ENSURE ENLIVE) LIQD Take 237 mLs by mouth 2 (two) times daily between meals. 10/25/18   Angiulli, Lavon Paganini, PA-C  ferrous sulfate 325 (65 FE) MG tablet Take 1 tablet (325 mg total) by mouth daily with breakfast. 10/25/18   Angiulli, Lavon Paganini, PA-C  HYDROcodone-acetaminophen (NORCO/VICODIN) 5-325 MG tablet Take 1 tablet by mouth every 4 (four) hours as needed for moderate pain. 10/25/18   Angiulli, Lavon Paganini, PA-C  losartan (COZAAR) 100 MG tablet Take 1 tablet (100 mg total) by mouth daily. 10/25/18   Angiulli, Lavon Paganini, PA-C  megestrol (MEGACE) 400 MG/10ML suspension Take 10 mLs (400 mg total) by mouth 2 (two) times daily. 10/25/18   Angiulli, Lavon Paganini, PA-C  metoprolol tartrate (LOPRESSOR) 25 MG tablet Take 1 tablet (25 mg total) by mouth 2 (two) times daily. 10/25/18   Angiulli, Lavon Paganini, PA-C  Multiple Vitamin (MULTIVITAMIN WITH MINERALS) TABS tablet Take 1 tablet by mouth daily. 01/14/18   Demetrios Loll, MD   Multiple Vitamins-Minerals (OCUVITE PO) Take 1 capsule by mouth daily.    [provider]  nitroGLYCERIN (NITROSTAT) 0.4 MG SL tablet Place 1 tablet (0.4 mg total) under the tongue every 5 (five) minutes as needed for chest pain. 06/16/17   Demetrios Loll, MD  omeprazole (PRILOSEC) 20 MG capsule Take 1 capsule (20 mg total) by mouth daily. 10/25/18   Angiulli, Lavon Paganini, PA-C  ondansetron (ZOFRAN) 4 MG tablet Take 1 tablet (4 mg total) by mouth every 8 (eight) hours as needed for nausea or vomiting. 10/25/18   Angiulli, Lavon Paganini, PA-C  potassium chloride (K-DUR) 10 MEQ tablet Take 10 mEq by mouth daily.    [provider]  protein supplement shake (PREMIER PROTEIN) LIQD Take 325 mLs (11 oz total) by mouth 2 (two) times daily between meals. Patient not taking: Reported on 09/29/2018 01/14/18   Demetrios Loll, MD  rosuvastatin (CRESTOR) 40 MG tablet Take 40 mg by mouth at bedtime.    [provider]  senna-docusate (SENNA S) 8.6-50 MG tablet Take 1 tablet by mouth daily.     [provider]  sertraline (ZOLOFT) 100 MG tablet Take 1 tablet (100 mg total) by mouth daily. 10/25/18   Angiulli, Lavon Paganini, PA-C    Allergies Erythromycin base and Tape  Family History  Problem Relation Age of Onset  . Heart attack Father        MI age 4 and 37  . Heart attack Sister 42       cabg; deceased 46  . Diabetes Sister   . Hypertension Sister   . Heart attack Brother 28       cabg; deceased 28s  . Diabetes Brother   . Colon cancer Mother 4       deceased 69  . Diabetes Maternal Grandmother   . Cancer Maternal Grandfather        unknown type; deceased  . Hypertension Daughter   . Thyroid disease Daughter   . Breast cancer Neg Hx     Social History Social History   Tobacco Use  . Smoking status: Former Smoker    Packs/day: 2.00    Years: 26.00    Pack years: 52.00    Types: Cigarettes    Last attempt to quit: 06/22/1984    Years since quitting: 34.3  . Smokeless tobacco:  Never Used  Substance Use Topics  . Alcohol use: Yes    Alcohol/week: 0.0  standard drinks    Frequency: Never    Comment: rare- less than once a month  . Drug use: No    Review of Systems  Constitutional: No fever. Eyes: No visual changes. ENT: No neck pain. Cardiovascular: Denies chest pain. Respiratory: Denies shortness of breath. Gastrointestinal: No nausea or vomiting. Genitourinary: Negative for dysuria.  Musculoskeletal: Negative for back pain. Skin: Negative for rash. Neurological: Negative for headaches, focal weakness or numbness.   ____________________________________________   PHYSICAL EXAM:  VITAL SIGNS: ED Triage Vitals  Enc Vitals Group     BP 11/04/18 1013 (!) 155/73     Pulse Rate 11/04/18 1013 64     Resp 11/04/18 1013 15     Temp 11/04/18 1013 97.8 F (36.6 C)     Temp Source 11/04/18 1013 Oral     SpO2 11/04/18 1013 99 %     Weight 11/04/18 1011 140 lb 10.5 oz (63.8 kg)     Height 11/04/18 1011 5\' 8"  (1.727 m)     Head Circumference --      Peak Flow --      Pain Score 11/04/18 1011 0     Pain Loc --      Pain Edu? --      Excl. in [place]? --     Constitutional: Alert and oriented.  Frail appearing but in no acute distress. Eyes: Conjunctivae are normal.  EOMI.  PERRLA. Head: Atraumatic. Nose: No congestion/rhinnorhea. Mouth/Throat: Mucous membranes are dry.   Neck: Normal range of motion.  Cardiovascular: Normal rate, regular rhythm. Good peripheral circulation. Respiratory: Normal respiratory effort.  No retractions.  Gastrointestinal: Soft and nontender. No distention.  Genitourinary: No flank tenderness. Musculoskeletal: No lower extremity edema.  Extremities warm and well perfused.  Neurologic:  Normal speech and language.  Motor intact in all extremities.  Normal coordination on finger-to-nose.  No cerebellar signs.  No gross focal neurologic deficits are appreciated.  Skin:  Skin is warm and dry. No rash noted. Psychiatric: Mood and  affect are normal. Speech and behavior are normal.  ____________________________________________   LABS (all labs ordered are listed, but only abnormal results are displayed)  Labs Reviewed  CBC WITH DIFFERENTIAL/PLATELET - Abnormal; Notable for the following components:      Result Value   RBC 3.38 (*)    Hemoglobin 10.5 (*)    HCT 32.9 (*)    RDW 16.0 (*)    All other components within normal limits  COMPREHENSIVE METABOLIC PANEL - Abnormal; Notable for the following components:   Potassium 3.4 (*)    Chloride 112 (*)    CO2 19 (*)    BUN 26 (*)    Total Protein 6.1 (*)    Alkaline Phosphatase 160 (*)    GFR calc non Af Amer 58 (*)    All other components within normal limits  URINALYSIS, COMPLETE (UACMP) WITH MICROSCOPIC - Abnormal; Notable for the following components:   Color, Urine AMBER (*)    APPearance CLOUDY (*)    Ketones, ur 5 (*)    Leukocytes,Ua SMALL (*)    Bacteria, UA MANY (*)    All other components within normal limits  TROPONIN I   ____________________________________________  EKG  ED ECG REPORT I, Arta Silence, the attending physician, personally viewed and interpreted this ECG.  Date: 11/04/2018 EKG Time: 1014 Rate: 64 Rhythm: normal sinus rhythm QRS Axis: normal Intervals: normal ST/T Wave abnormalities: LVH Narrative Interpretation: no evidence of acute ischemia; no significant change when  compared to prior EKGs  ____________________________________________  RADIOLOGY    ____________________________________________   PROCEDURES  Procedure(s) performed: No  Procedures  Critical Care performed: No ____________________________________________   INITIAL IMPRESSION / ASSESSMENT AND PLAN / ED COURSE  Pertinent labs & imaging results that were available during my care of the patient were reviewed by me and considered in my medical decision making (see chart for details).  78 year old female with PMH as noted above  including ovarian cancer, atrial fibrillation (no longer on anticoagulation), anemia, and recent traumatic brain hemorrhage presents with acute onset of dizziness this morning which is now subsiding.  Review of systems is otherwise negative.  I reviewed the past medical records in [place].  The patient had a traumatic brain hemorrhage in April and was admitted at [facility].  She subsequently went to rehab and was discharged on 10/24/2018.  The patient states that she has had a fall in the last 10 days since she left the hospital, but it was several days ago and she states she did not hit her head.  On exam today, the patient is somewhat frail-appearing as would be expected with her chronic illnesses but generally comfortable and in no acute distress.  Her vital signs are normal except for mild hypertension.  Neuro exam is nonfocal.  She has very dry mucous membranes.  The remainder of the exam is as described above.  EKG shows no concerning abnormalities.  Differential includes dehydration, other metabolic etiology such as electrolyte abnormality, renal insufficiency, or less likely cardiac cause or infection.  There is no evidence of recurrent brain hemorrhage or any primary CNS cause.  We will obtain lab work-up, give a fluid bolus, and reassess.  ----------------------------------------- 3:17 PM on 11/04/2018 -----------------------------------------  Lab work-up is unremarkable, however the UA does show WBCs, bacteria, and leukocytes.  The patient was on Keflex at the end of her rehab stay for a UTI and it appears that it is persisting.  I will give a dose of ceftriaxone here and start the patient on a new 10-day course of Keflex.  The patient feels much better and wants to go home.   ----------------------------------------- 3:38 PM on 11/04/2018 -----------------------------------------  I discussed the case with the patient's daughter who is a Veterinary surgeon here.  She is concerned that  the patient cannot safely go home since she is living with her elderly husband who is unable to take care of her, and the current home health services are in adequate.  The daughter would like the patient evaluated for possible placement.  I discussed this with the LCSW and placed an order for physical therapy.  The patient will board in the ED for now.  I am signing out to the oncoming physician Dr. Burlene Arnt. ____________________________________________   FINAL CLINICAL IMPRESSION(S) / ED DIAGNOSES  Final diagnoses:  Urinary tract infection without hematuria, site unspecified      NEW MEDICATIONS STARTED DURING THIS VISIT:  New Prescriptions   CEPHALEXIN (KEFLEX) 250 MG CAPSULE    Take 1 capsule (250 mg total) by mouth every 8 (eight) hours for 10 days.     Note:  This document was prepared using Dragon voice recognition software and may include unintentional dictation errors.    Arta Silence, MD 11/04/18 1519    Arta Silence, MD 11/04/18 1539

## 2018-11-04 NOTE — Discharge Instructions (Signed)
Take the antibiotic as prescribed and finish the full course.  Return to the ER for new, worsening, persistent dizziness or lightheadedness, fever, vomiting, or any other new or worsening symptoms that concern you. Follow up with your regular doctor.

## 2018-11-04 NOTE — NC FL2 (Deleted)
[place] LEVEL OF CARE SCREENING TOOL     IDENTIFICATION  Patient Name: Margaret [name] Birthdate: [birth year] Sex: female Admission Date (Current Location): 11/04/2018  [place] and [place] Number:  Engineering geologist and Address:  [facility], [address], [place], [place] [address]      Provider Number: [id number]  Attending Physician Name and Address:  Arta Silence, MD  Relative Name and Phone Number:  Daugher, Luberta Robertson    Current Level of Care: Hospital Recommended Level of Care: [place] Prior Approval Number:    Date Approved/Denied:   PASRR Number: [id number]  Discharge Plan: SNF    Current Diagnoses: Patient Active Problem List   Diagnosis Date Noted  . Hypokalemia   . Chronic combined systolic and diastolic congestive heart failure ([place])   . Anemia of chronic disease   . Acute combined systolic and diastolic congestive heart failure ([place])   . Traumatic subdural hematoma ([place]) 10/12/2018  . Acute blood loss anemia   . Diabetes mellitus type 2 in nonobese (HCC)   . Malignant neoplasm of ovary ([place])   . PAF (paroxysmal atrial fibrillation) ([place])   . Chronic diastolic congestive heart failure ([place])   . Coronary artery disease involving native coronary artery of native heart without angina pectoris   . Subarachnoid hemorrhage ([place]) 10/08/2018  . Right ovarian cyst 08/12/2018  . Accelerated hypertension 07/31/2018  . Elevated lactic acid level 07/31/2018  . Hypertensive urgency 07/31/2018  . Goals of care, counseling/discussion 07/22/2018  . 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 Controlled type 2 diabetes mellitus without complication, without long-term current use of insulin ([place]) 02/02/2018  . Bradycardia 01/13/2018  . Essential hypertension 11/04/2017  . Other hyperlipidemia 11/04/2017  . Hx of gastric bypass 11/04/2017  . NSTEMI (non-ST elevated  myocardial infarction) ([place]) 06/14/2017  . History of ischemic right PCA stroke 03/27/2016  . Hemianopia, homonymous, left 01/31/2016  . Stroke ([place]) 11/06/2015  . Arthritis of knee, degenerative 05/04/2013  . Diastolic dysfunction 62/95/2841  . CAD S/P percutaneous coronary angioplasty   . Morbid obesity ([place])   . Right shoulder pain   . Anxiety and depression   . Dyspnea on exertion   . Cellulitis of right leg     Orientation RESPIRATION BLADDER Height & Weight     Self, Time, Situation, Place  Normal Continent Weight: 63.8 kg Height:  5\' 8"  (172.7 cm)  BEHAVIORAL SYMPTOMS/MOOD NEUROLOGICAL BOWEL NUTRITION STATUS      Continent Diet  AMBULATORY STATUS COMMUNICATION OF NEEDS Skin   Extensive Assist Verbally Skin abrasions, Bruising                       Personal Care Assistance Level of Assistance  Bathing, Feeding, Dressing, Total care Bathing Assistance: Limited assistance Feeding assistance: Independent Dressing Assistance: Maximum assistance Total Care Assistance: Maximum assistance   Functional Limitations Info  Sight, Speech, Hearing Sight Info: Adequate Hearing Info: Adequate Speech Info: Adequate    SPECIAL CARE FACTORS FREQUENCY  PT (By licensed PT), OT (By licensed OT)     PT Frequency: 5x per week OT Frequency: 5x per week            Contractures Contractures Info: Not present    Additional Factors Info                  Current Medications (11/04/2018):  This is the current hospital active medication list No  current facility-administered medications for this encounter.    Current Outpatient Medications  Medication Sig Dispense Refill  . acetaminophen (TYLENOL) 325 MG tablet Take 2 tablets (650 mg total) by mouth every 4 (four) hours as needed for mild pain.    Marland Kitchen amLODipine (NORVASC) 2.5 MG tablet Take 1 tablet (2.5 mg total) by mouth daily. 90 tablet 3  . atorvastatin (LIPITOR) 80 MG tablet Take 1 tablet (80 mg total) by mouth at  bedtime. 30 tablet 0  . cyanocobalamin (,VITAMIN B-12,) 1000 MCG/ML injection Inject 1,000 mcg into the muscle every 30 (thirty) days.    . cyclobenzaprine (FLEXERIL) 10 MG tablet Take 1 tablet (10 mg total) by mouth 3 (three) times daily as needed for muscle spasms. 30 tablet 0  . ergocalciferol (VITAMIN D2) 1.25 MG (50000 UT) capsule Take 50,000 Units by mouth every Monday.    . ezetimibe (ZETIA) 10 MG tablet TAKE 1 TABLET(10 MG) BY MOUTH DAILY 30 tablet 5  . feeding supplement, ENSURE ENLIVE, (ENSURE ENLIVE) LIQD Take 237 mLs by mouth 2 (two) times daily between meals. 237 mL 12  . ferrous sulfate 325 (65 FE) MG tablet Take 1 tablet (325 mg total) by mouth daily with breakfast. 30 tablet 3  . HYDROcodone-acetaminophen (NORCO/VICODIN) 5-325 MG tablet Take 1 tablet by mouth every 4 (four) hours as needed for moderate pain. 30 tablet 0  . losartan (COZAAR) 100 MG tablet Take 1 tablet (100 mg total) by mouth daily. 30 tablet 0  . megestrol (MEGACE) 400 MG/10ML suspension Take 10 mLs (400 mg total) by mouth 2 (two) times daily. 240 mL 0  . metoprolol tartrate (LOPRESSOR) 25 MG tablet Take 1 tablet (25 mg total) by mouth 2 (two) times daily. 60 tablet 0  . Multiple Vitamin (MULTIVITAMIN WITH MINERALS) TABS tablet Take 1 tablet by mouth daily. 30 tablet 1  . Multiple Vitamins-Minerals (OCUVITE PO) Take 1 capsule by mouth daily.    . nitroGLYCERIN (NITROSTAT) 0.4 MG SL tablet Place 1 tablet (0.4 mg total) under the tongue every 5 (five) minutes as needed for chest pain. 30 tablet 2  . omeprazole (PRILOSEC) 20 MG capsule Take 1 capsule (20 mg total) by mouth daily. 30 capsule 0  . ondansetron (ZOFRAN) 4 MG tablet Take 1 tablet (4 mg total) by mouth every 8 (eight) hours as needed for nausea or vomiting. 20 tablet 0  . potassium chloride (K-DUR) 10 MEQ tablet Take 10 mEq by mouth daily.    . protein supplement shake (PREMIER PROTEIN) LIQD Take 325 mLs (11 oz total) by mouth 2 (two) times daily between  meals. (Patient not taking: Reported on 09/29/2018) 60 Can 0  . rosuvastatin (CRESTOR) 40 MG tablet Take 40 mg by mouth at bedtime.    . senna-docusate (SENNA S) 8.6-50 MG tablet Take 1 tablet by mouth daily.     . sertraline (ZOLOFT) 100 MG tablet Take 1 tablet (100 mg total) by mouth daily. 30 tablet 0     Discharge Medications: Please see discharge summary for a list of discharge medications.  Relevant Imaging Results:  Relevant Lab Results:   Additional Information SS# [id number]  Latanya Maudlin, RN

## 2018-11-04 NOTE — Clinical Social Work Note (Addendum)
CSW was informed by patient's daughter [name] that patient's husband is coming to pick up patient from the ED.  Patient's daughter wanted her placed, but husband is not interested.  Patient is followed by [facility], [facility] is aware that patient is discharging today.  Patient's daughter is concerned that patient may have another fall and patient is not very compliant with continuing with her exercises.  Evette Cristal, MSW, LCSW ED Covering CSW [phone] 11/04/2018 5:00 PM

## 2018-11-04 NOTE — Telephone Encounter (Signed)
I have tried calling several times and I get a busy signal. Since this message . Triage has sent another message that she is sending her to ER

## 2018-11-04 NOTE — Telephone Encounter (Signed)
Daughter called with concerns that her father is unable to care for her mother and that she cannot live with them to help more. She suggests a Palliative Care referral be made to discuss her options. She reports that patient is eating and drinking very little. Patient is currently in ER. She would like a return call [phone]

## 2018-11-04 NOTE — Telephone Encounter (Signed)
Melissa, OT from [facility] called stating patient missed a visit this week due to doctors appt.

## 2018-11-04 NOTE — Telephone Encounter (Signed)
It will have to be whatever they can do. She had 1 fall while walking trying to get exercise.

## 2018-11-04 NOTE — Telephone Encounter (Signed)
Message sent to [facility] at front desk to un arrive pt and cancel her appointment.

## 2018-11-04 NOTE — Telephone Encounter (Signed)
[name] called reporting that she was unable to get patient out of bed this morning and in fact has called EMS to come take her to the ER for evaluation. She will NOT be coming to her appointment this morning

## 2018-11-04 NOTE — ED Triage Notes (Signed)
Arrives from [facility] c/o dizziness this morning when getting out of bed.  patient states she had an appointment today at the cancer center to have labs drawn.  Arrives AAOx3.  skin warm and dry. NAD.  States dizziness has lessened, but contines to c/o "a little bit" of dizziness.  AAOx3.  skin warm and dry.  MAE equally and strong.  EMS reports VSS.

## 2018-11-04 NOTE — Telephone Encounter (Signed)
New Message           Patient's daughter call to let us know that the patient is in the hospital and will not be available to do the virtual call today

## 2018-11-04 NOTE — ED Provider Notes (Signed)
-----------------------------------------   10:58 PM on 11/04/2018 -----------------------------------------  Was signed out to me this afternoon is a boarder in the emergency room out of concerns of family members about her safety to go home..  I have ordered her home medications and urinary tract infection treatment.  Patient is in no acute distress she is resting comfortably and chatting easily with me when I visit her.  Signed out at the end of my shift.   Schuyler Amor, MD 11/04/18 2259

## 2018-11-04 NOTE — Clinical Social Work Note (Signed)
CSW attempted to contact patient's daughter Barrett Henle, [phone] to discuss short term rehab options, CSW left message on voice mail awaiting for call back.  Evette Cristal, MSW, LCSW ED Covering CSW [phone] 11/04/2018 3:49 PM

## 2018-11-05 ENCOUNTER — Encounter: Payer: Self-pay | Admitting: Internal Medicine

## 2018-11-05 DIAGNOSIS — F419 Anxiety disorder, unspecified: Secondary | ICD-10-CM | POA: Diagnosis present

## 2018-11-05 DIAGNOSIS — I5042 Chronic combined systolic (congestive) and diastolic (congestive) heart failure: Secondary | ICD-10-CM | POA: Diagnosis present

## 2018-11-05 DIAGNOSIS — N3000 Acute cystitis without hematuria: Secondary | ICD-10-CM | POA: Diagnosis present

## 2018-11-05 DIAGNOSIS — I252 Old myocardial infarction: Secondary | ICD-10-CM | POA: Diagnosis not present

## 2018-11-05 DIAGNOSIS — I2510 Atherosclerotic heart disease of native coronary artery without angina pectoris: Secondary | ICD-10-CM | POA: Diagnosis present

## 2018-11-05 DIAGNOSIS — E119 Type 2 diabetes mellitus without complications: Secondary | ICD-10-CM | POA: Diagnosis present

## 2018-11-05 DIAGNOSIS — I480 Paroxysmal atrial fibrillation: Secondary | ICD-10-CM | POA: Diagnosis present

## 2018-11-05 DIAGNOSIS — Z96642 Presence of left artificial hip joint: Secondary | ICD-10-CM | POA: Diagnosis present

## 2018-11-05 DIAGNOSIS — K219 Gastro-esophageal reflux disease without esophagitis: Secondary | ICD-10-CM | POA: Diagnosis present

## 2018-11-05 DIAGNOSIS — Z7401 Bed confinement status: Secondary | ICD-10-CM | POA: Diagnosis not present

## 2018-11-05 DIAGNOSIS — Z91048 Other nonmedicinal substance allergy status: Secondary | ICD-10-CM | POA: Diagnosis not present

## 2018-11-05 DIAGNOSIS — I951 Orthostatic hypotension: Secondary | ICD-10-CM | POA: Diagnosis present

## 2018-11-05 DIAGNOSIS — Z8673 Personal history of transient ischemic attack (TIA), and cerebral infarction without residual deficits: Secondary | ICD-10-CM | POA: Diagnosis not present

## 2018-11-05 DIAGNOSIS — F329 Major depressive disorder, single episode, unspecified: Secondary | ICD-10-CM | POA: Diagnosis present

## 2018-11-05 DIAGNOSIS — Z9049 Acquired absence of other specified parts of digestive tract: Secondary | ICD-10-CM | POA: Diagnosis not present

## 2018-11-05 DIAGNOSIS — E785 Hyperlipidemia, unspecified: Secondary | ICD-10-CM | POA: Diagnosis present

## 2018-11-05 DIAGNOSIS — Z888 Allergy status to other drugs, medicaments and biological substances status: Secondary | ICD-10-CM | POA: Diagnosis not present

## 2018-11-05 DIAGNOSIS — Z1159 Encounter for screening for other viral diseases: Secondary | ICD-10-CM | POA: Diagnosis not present

## 2018-11-05 DIAGNOSIS — Z86010 Personal history of colonic polyps: Secondary | ICD-10-CM | POA: Diagnosis not present

## 2018-11-05 DIAGNOSIS — I110 Hypertensive heart disease with heart failure: Secondary | ICD-10-CM | POA: Diagnosis present

## 2018-11-05 DIAGNOSIS — Z9884 Bariatric surgery status: Secondary | ICD-10-CM | POA: Diagnosis not present

## 2018-11-05 DIAGNOSIS — E876 Hypokalemia: Secondary | ICD-10-CM | POA: Diagnosis present

## 2018-11-05 DIAGNOSIS — Z8543 Personal history of malignant neoplasm of ovary: Secondary | ICD-10-CM | POA: Diagnosis not present

## 2018-11-05 DIAGNOSIS — Z90710 Acquired absence of both cervix and uterus: Secondary | ICD-10-CM | POA: Diagnosis not present

## 2018-11-05 LAB — SARS CORONAVIRUS 2 BY RT PCR (HOSPITAL ORDER, PERFORMED IN ~~LOC~~ HOSPITAL LAB): SARS Coronavirus 2: NEGATIVE

## 2018-11-05 MED ORDER — ACETAMINOPHEN 325 MG PO TABS: 650.0000 mg | ORAL_TABLET | Freq: Four times a day (QID) | ORAL | Status: DC | PRN

## 2018-11-05 MED ORDER — ENOXAPARIN SODIUM 40 MG/0.4ML ~~LOC~~ SOLN
40.0000 mg | SUBCUTANEOUS | Status: DC
  Administered 2018-11-05 – 2018-11-06 (×2): 40 mg via SUBCUTANEOUS
  Filled 2018-11-05 (×2): qty 0.4

## 2018-11-05 MED ORDER — ACETAMINOPHEN 650 MG RE SUPP: 650.0000 mg | Freq: Four times a day (QID) | RECTAL | Status: DC | PRN

## 2018-11-05 MED ORDER — ONDANSETRON HCL 4 MG/2ML IJ SOLN: 4.0000 mg | Freq: Four times a day (QID) | INTRAMUSCULAR | Status: DC | PRN

## 2018-11-05 MED ORDER — EZETIMIBE 10 MG PO TABS
10.0000 mg | ORAL_TABLET | Freq: Every day | ORAL | Status: DC
  Administered 2018-11-05 – 2018-11-06 (×2): 10 mg via ORAL
  Filled 2018-11-05 (×4): qty 1

## 2018-11-05 MED ORDER — ATORVASTATIN CALCIUM 20 MG PO TABS
80.0000 mg | ORAL_TABLET | Freq: Every day | ORAL | Status: DC
  Administered 2018-11-05 – 2018-11-06 (×2): 80 mg via ORAL
  Filled 2018-11-05 (×2): qty 4

## 2018-11-05 MED ORDER — SODIUM CHLORIDE 0.9 % IV SOLN
INTRAVENOUS | Status: DC
  Administered 2018-11-05 – 2018-11-06 (×3): via INTRAVENOUS

## 2018-11-05 MED ORDER — ONDANSETRON HCL 4 MG PO TABS: 4.0000 mg | ORAL_TABLET | Freq: Four times a day (QID) | ORAL | Status: DC | PRN

## 2018-11-05 MED ORDER — SODIUM CHLORIDE 0.9 % IV SOLN
1.0000 g | INTRAVENOUS | Status: DC
  Administered 2018-11-05 – 2018-11-06 (×2): 1 g via INTRAVENOUS
  Filled 2018-11-05 (×2): qty 1
  Filled 2018-11-05: qty 10

## 2018-11-05 MED ORDER — SENNOSIDES-DOCUSATE SODIUM 8.6-50 MG PO TABS
1.0000 | ORAL_TABLET | Freq: Every day | ORAL | Status: DC
  Administered 2018-11-05 – 2018-11-07 (×3): 1 via ORAL
  Filled 2018-11-05 (×3): qty 1

## 2018-11-05 NOTE — ED Notes (Signed)
ED TO INPATIENT HANDOFF REPORT  ED Nurse Name and Phone #:  Anda Kraft [phone]  S Name/Age/Gender Margaret [name] 78 y.o. female Room/Bed: ED17A/ED17A  Code Status   Code Status: Prior  Home/SNF/Other Home Patient oriented to: self, place, time and situation Is this baseline? Yes   Triage Complete: Triage complete  Chief Complaint [facility] Dizzy  Triage Note Arrives from [facility] c/o dizziness this morning when getting out of bed.  patient states she had an appointment today at the cancer center to have labs drawn.  Arrives AAOx3.  skin warm and dry. NAD.  States dizziness has lessened, but contines to c/o "a little bit" of dizziness.  AAOx3.  skin warm and dry.  MAE equally and strong.  EMS reports VSS.   Allergies Allergies  Allergen Reactions  . Erythromycin Base Nausea And Vomiting  . Tape Itching, Rash and Other (See Comments)    Also "band-aids"    Level of Care/Admitting Diagnosis ED Disposition    ED Disposition Condition [facility] Area: [place] [[id number]]  Level of Care: Med-Surg [16]  Covid Evaluation: N/A  Diagnosis: Acute cystitis [595.0.ICD-9-CM]  Admitting Physician: Gladstone Lighter [[id number]]  Attending Physician: Gladstone Lighter [[id number]]  Estimated length of stay: past midnight tomorrow  Certification:: I certify this patient will need inpatient services for at least 2 midnights  PT Class (Do Not Modify): Inpatient [101]  PT Acc Code (Do Not Modify): Private [1]       B Medical/Surgery History Past Medical History:  Diagnosis Date  . Acetabular fracture ([place])   . Anxiety and depression   . Bartholin cyst   . CAD (coronary artery disease)   . Cancer ([place])    mouth cyst was cancer many years ago   . Cellulitis of right leg 2005  . CHF (congestive heart failure) ([place])   . Colon polyp   . Complication of anesthesia   . Diabetes mellitus without complication ([place])   . DJD (degenerative joint disease)   .  Dyspnea on exertion   . Dysrhythmia    PAF  . GERD (gastroesophageal reflux disease)   . Hyperlipidemia   . Hypertension   . Morbid obesity ([place])   . Myocardial infarction ([place]) 06/14/2017  . Ovarian cancer ([place])   . PAF (paroxysmal atrial fibrillation) ([place])    a. diagnosed 7/19; b. CHADS2VASc 8; c. Eliquis  . PONV (postoperative nausea and vomiting)   . Presence of stent in right coronary artery 02/08/12   2.5x18 Xience distal RCA  . Right shoulder pain   . Stroke ([place]) 2017  . Vaginal prolapse    Past Surgical History:  Procedure Laterality Date  . ABDOMINAL HYSTERECTOMY     supracervical abdominal w/removal tubes &/or ovaries  . BREAST BIOPSY  1989  . CHOLECYSTECTOMY    . COLONOSCOPY    . COLONOSCOPY WITH PROPOFOL N/A 06/21/2018   Procedure: COLONOSCOPY WITH PROPOFOL;  Surgeon: Lollie Sails, MD;  Location: [facility] ENDOSCOPY;  Service: Endoscopy;  Laterality: N/A;  . CORONARY STENT INTERVENTION N/A 06/14/2017   Procedure: CORONARY STENT INTERVENTION;  Surgeon: Wellington Hampshire, MD;  Location: [place] CV LAB;  Service: Cardiovascular;  Laterality: N/A;  . ESOPHAGOGASTRODUODENOSCOPY N/A 06/21/2018   Procedure: ESOPHAGOGASTRODUODENOSCOPY (EGD);  Surgeon: Lollie Sails, MD;  Location: [facility] ENDOSCOPY;  Service: Endoscopy;  Laterality: N/A;  . FACIAL COSMETIC SURGERY     face/neck lift  . FRACTURE SURGERY    . GASTRIC BYPASS  2008  .  JOINT REPLACEMENT    . KNEE SURGERY     left knee arthroscopic  . LAPAROSCOPIC BILATERAL SALPINGO OOPHERECTOMY Bilateral 06/24/2018   Procedure: LAPAROSCOPIC BILATERAL SALPINGO OOPHORECTOMY;  Surgeon: Schermerhorn, Gwen Her, MD;  Location: [facility] ORS;  Service: Gynecology;  Laterality: Bilateral;  . LEFT HEART CATH AND CORONARY ANGIOGRAPHY N/A 06/14/2017   Procedure: LEFT HEART CATH AND CORONARY ANGIOGRAPHY;  Surgeon: Wellington Hampshire, MD;  Location: [place] CV LAB;  Service: Cardiovascular;  Laterality: N/A;  . LEFT HEART CATH AND  CORONARY ANGIOGRAPHY N/A 06/14/2017   Procedure: LEFT HEART CATH AND CORONARY ANGIOGRAPHY;  Surgeon: Wellington Hampshire, MD;  Location: [place] CV LAB;  Service: Cardiovascular;  Laterality: N/A;  . LYSIS OF ADHESION  06/24/2018   Procedure: LYSIS OF ADHESION;  Surgeon: Ouida Sills Gwen Her, MD;  Location: [facility] ORS;  Service: Gynecology;;  . Crisoforo [place]   left hip fracture/repair and left arm fracture  . OTHER SURGICAL HISTORY  1986   hysterectomy  . PORTA CATH INSERTION N/A 07/26/2018   Procedure: PORTA CATH INSERTION;  Surgeon: Katha Cabal, MD;  Location: [place] CV LAB;  Service: Cardiovascular;  Laterality: N/A;  . right knee  late 90's   arthroscopic  . ROUX-EN-Y PROCEDURE    . SALIVARY GLAND SURGERY    . TOTAL HIP ARTHROPLASTY  2004   left     A IV Location/Drains/Wounds Patient Lines/Drains/Airways Status   Active Line/Drains/Airways    Name:   Placement date:   Placement time:   Site:   Days:   Implanted Port 07/26/18 Right Chest   07/26/18    1516    Chest   102   Incision (Closed) 06/24/18 Abdomen   06/24/18    0908     134   Incision (Closed) 06/24/18 Vagina   06/24/18    0908     134   Incision - 3 Ports Abdomen 1: Right 2: Left 3: Umbilicus   25/36/64    4034     134          Intake/Output Last 24 hours  Intake/Output Summary (Last 24 hours) at 11/05/2018 1612 Last data filed at 11/04/2018 1645 Gross per 24 hour  Intake 99.42 ml  Output -  Net 99.42 ml    Labs/Imaging Results for orders placed or performed during the hospital encounter of 11/04/18 (from the past 48 hour(s))  CBC with Differential     Status: Abnormal   Collection Time: 11/04/18 10:39 AM  Result Value Ref Range   WBC 9.0 4.0 - 10.5 K/uL   RBC 3.38 (L) 3.87 - 5.11 MIL/uL   Hemoglobin 10.5 (L) 12.0 - 15.0 g/dL   HCT 32.9 (L) 36.0 - 46.0 %   MCV 97.3 80.0 - 100.0 fL   MCH 31.1 26.0 - 34.0 pg   MCHC 31.9 30.0 - 36.0 g/dL   RDW 16.0 (H) 11.5 - 15.5 %   Platelets 210 150 - 400  K/uL   nRBC 0.0 0.0 - 0.2 %   Neutrophils Relative % 73 %   Neutro Abs 6.5 1.7 - 7.7 K/uL   Lymphocytes Relative 19 %   Lymphs Abs 1.7 0.7 - 4.0 K/uL   Monocytes Relative 6 %   Monocytes Absolute 0.6 0.1 - 1.0 K/uL   Eosinophils Relative 1 %   Eosinophils Absolute 0.1 0.0 - 0.5 K/uL   Basophils Relative 1 %   Basophils Absolute 0.1 0.0 - 0.1 K/uL   Immature Granulocytes  0 %   Abs Immature Granulocytes 0.04 0.00 - 0.07 K/uL    Comment: Performed at [facility], [address] [place], [place] [address]  Troponin I - Once     Status: None   Collection Time: 11/04/18 10:39 AM  Result Value Ref Range   Troponin I <0.03 <0.03 ng/mL    Comment: Performed at [facility], [address] [place], [place] [address]  Comprehensive metabolic panel     Status: Abnormal   Collection Time: 11/04/18 10:39 AM  Result Value Ref Range   Sodium 143 135 - 145 mmol/L   Potassium 3.4 (L) 3.5 - 5.1 mmol/L   Chloride 112 (H) 98 - 111 mmol/L   CO2 19 (L) 22 - 32 mmol/L   Glucose, Bld 93 70 - 99 mg/dL   BUN 26 (H) 8 - 23 mg/dL   Creatinine, Ser 0.93 0.44 - 1.00 mg/dL   Calcium 9.2 8.9 - 10.3 mg/dL   Total Protein 6.1 (L) 6.5 - 8.1 g/dL   Albumin 3.5 3.5 - 5.0 g/dL   AST 24 15 - 41 U/L   ALT 15 0 - 44 U/L   Alkaline Phosphatase 160 (H) 38 - 126 U/L   Total Bilirubin 1.1 0.3 - 1.2 mg/dL   GFR calc non Af Amer 58 (L) >60 mL/min   GFR calc Af Amer >60 >60 mL/min   Anion gap 12 5 - 15    Comment: Performed at [facility], [address] [place], [place] [address]  Urinalysis, Complete w Microscopic     Status: Abnormal   Collection Time: 11/04/18 10:39 AM  Result Value Ref Range   Color, Urine AMBER (A) YELLOW    Comment: BIOCHEMICALS MAY BE AFFECTED BY COLOR   APPearance CLOUDY (A) CLEAR   Specific Gravity, Urine 1.020 1.005 - 1.030   pH 5.0 5.0 - 8.0   Glucose, UA NEGATIVE NEGATIVE mg/dL   Hgb urine dipstick NEGATIVE NEGATIVE   Bilirubin Urine NEGATIVE  NEGATIVE   Ketones, ur 5 (A) NEGATIVE mg/dL   Protein, ur NEGATIVE NEGATIVE mg/dL   Nitrite NEGATIVE NEGATIVE   Leukocytes,Ua SMALL (A) NEGATIVE   RBC / HPF 0-5 0 - 5 RBC/hpf   WBC, UA 11-20 0 - 5 WBC/hpf   Bacteria, UA MANY (A) NONE SEEN   Squamous Epithelial / LPF 0-5 0 - 5   Mucus PRESENT    Hyaline Casts, UA PRESENT     Comment: Performed at [facility], [address] [place], [place] [address]  SARS Coronavirus 2 (CEPHEID - Performed in [place] hospital lab), Hosp Order     Status: None   Collection Time: 11/05/18  2:51 PM  Result Value Ref Range   SARS Coronavirus 2 NEGATIVE NEGATIVE    Comment: (NOTE) If result is NEGATIVE SARS-CoV-2 target nucleic acids are NOT DETECTED. The SARS-CoV-2 RNA is generally detectable in upper and lower  respiratory specimens during the acute phase of infection. The lowest  concentration of SARS-CoV-2 viral copies this assay can detect is 250  copies / mL. A negative result does not preclude SARS-CoV-2 infection  and should not be used as the sole basis for treatment or other  patient management decisions.  A negative result may occur with  improper specimen collection / handling, submission of specimen other  than nasopharyngeal swab, presence of viral mutation(s) within the  areas targeted by this assay, and inadequate number of viral copies  (<250 copies / mL).  A negative result must be combined with clinical  observations, patient history, and epidemiological information. If result is POSITIVE SARS-CoV-2 target nucleic acids are DETECTED. The SARS-CoV-2 RNA is generally detectable in upper and lower  respiratory specimens dur ing the acute phase of infection.  Positive  results are indicative of active infection with SARS-CoV-2.  Clinical  correlation with patient history and other diagnostic information is  necessary to determine patient infection status.  Positive results do  not rule out bacterial infection or  co-infection with other viruses. If result is PRESUMPTIVE POSTIVE SARS-CoV-2 nucleic acids MAY BE PRESENT.   A presumptive positive result was obtained on the submitted specimen  and confirmed on repeat testing.  While 2019 novel coronavirus  (SARS-CoV-2) nucleic acids may be present in the submitted sample  additional confirmatory testing may be necessary for epidemiological  and / or clinical management purposes  to differentiate between  SARS-CoV-2 and other Sarbecovirus currently known to infect humans.  If clinically indicated additional testing with an alternate test  methodology [phone]) is advised. The SARS-CoV-2 RNA is generally  detectable in upper and lower respiratory sp ecimens during the acute  phase of infection. The expected result is Negative. Fact Sheet for Patients:  StrictlyIdeas.no Fact Sheet for Healthcare Providers: BankingDealers.co.za This test is not yet approved or cleared by the [place] FDA and has been authorized for detection and/or diagnosis of SARS-CoV-2 by FDA under an Emergency Use Authorization (EUA).  This EUA will remain in effect (meaning this test can be used) for the duration of the COVID-19 declaration under Section 564(b)(1) of the Act, 21 U.S.C. section 360bbb-3(b)(1), unless the authorization is terminated or revoked sooner. Performed at [facility], [address] [place], [place] [address]    No results found.  Pending Labs [employer] (From admission, onward)    Start     Ordered   11/04/18 2235  Urine culture  ONCE - STAT,   STAT     11/04/18 2234   Signed and Held  Basic metabolic panel  Tomorrow morning,   R     Signed and Held   Signed and Held  CBC  Tomorrow morning,   R     Signed and Held   Signed and Held  Urine Culture  Add-on,   R     Signed and Held          Vitals/Pain Today's Vitals   11/05/18 1331 11/05/18 1400 11/05/18 1500 11/05/18 1600   BP:  129/79 (!) 144/79 (!) 143/82  Pulse:      Resp:  19 18 20   Temp:      TempSrc:      SpO2:      Weight:      Height:      PainSc: 0-No pain       Isolation Precautions No active isolations  Medications Medications  metoprolol tartrate (LOPRESSOR) tablet 25 mg (25 mg Oral Given 11/05/18 1028)  losartan (COZAAR) tablet 100 mg (100 mg Oral Given 11/05/18 1027)  amLODipine (NORVASC) tablet 2.5 mg (2.5 mg Oral Given 11/05/18 1029)  feeding supplement (ENSURE ENLIVE) (ENSURE ENLIVE) liquid 237 mL (has no administration in time range)  megestrol (MEGACE) 400 MG/10ML suspension 400 mg (400 mg Oral Given 11/05/18 1031)  pantoprazole (PROTONIX) EC tablet 40 mg (40 mg Oral Given 11/05/18 1028)  sertraline (ZOLOFT) tablet 100 mg (100 mg Oral Given 11/05/18 1028)  cephALEXin (KEFLEX) capsule 500 mg (500 mg Oral Given 11/05/18 1331)  sodium chloride 0.9 % bolus 500 mL (0 mLs Intravenous Stopped 11/04/18 1345)  cefTRIAXone (ROCEPHIN) 1 g in sodium chloride 0.9 % 100 mL IVPB (0 g Intravenous Stopped 11/04/18 1645)    Mobility non-ambulatory Moderate fall risk   Focused Assessments    R Recommendations: See Admitting Provider Note  Report given to:   Additional Notes:

## 2018-11-05 NOTE — Evaluation (Signed)
Physical Therapy Evaluation Patient Details Name: Margaret [name] MRN: [id number] DOB: 08-15-1940 Today's Date: 11/05/2018   History of Present Illness  Vieno Tarrant is a 78yo female who comes to [facility] on 5/15 with dizziness, DTR unable to get patient OOB for a medical appointment. DTR reports pt has been falling. PMH: CAD s/p stenting, dCHF, NIDDM2, PAF, ovarian CA on chemotherapy, recent fall and bilat [place] (4/21) now s/p 13d CIR c DC to home on 5/5. Pt reports she has been falling more, but denies acute weakness, denies LOC, denies seizure activity, reports that she simply trips or loses balance, and often has limited compliance of RW use. Pt also presenting with memory difficulty, which she reports is baseline, but deines any acute mentation changes. Review of acute stay/CIR notes demonstrate poor progression and consistentcy of AMB capability typically tolerating ~49ft per bout with RW, and as far as 73ft. Multiple sessions with CIR PT also note orthostatic BP limitations, as well as well documented cogntiive impairment, need for frequet reorientation, and need for supervision for safety. Pt has had [place] services since DC to home 10DA. She lives with husband who is not physically capable of providing physical assist as needed, unclear to what degree he has been successful in monitoring patient safety given falls and noncompliance with assistive device. DTR, who is an Therapist, sports, voices concerns about patient safety at home, whereas pt's husband feels capable of caring for patient in the home.   Clinical Impression  Pt admitted with above diagnosis. Pt currently with functional limitations due to the deficits listed below (see "PT Problem List"). Upon entry, pt in bed, awake and agreeable to participate. The pt is alert and oriented x3, very pleasant, conversational, and generally a poor historian, but is able to answer some questions. Much of the patient's functional baseline is taken from recent notes at [facility] and  acute admission to [facility]. Pt has dizziness moving to EOB, then upon standing tolerates less than 10s before needing to sit for fear of LOC. Pt is noted to have >25mmHg drop from supine to standing-attempt, although she does not tolerate standing for long enough to be formally assessed. Pt denies any acute weakness or acute mentation changes. Bed mobility with moderate effort and no physical assistance needed, STS transfer c minA, AMB deferred 2/2 (+) orthos. Functional mobility assessment demonstrates increased effort/time requirements, poor tolerance, and need for physical assistance, whereas the patient performed these at a similar level of independence PTA. Orthostatic BP drops were also a recurrent factor while in CIR 2WA. Pt continues to demonstrate high level falls risk 2/2 her limited insight regarding deficits, impulsive behavior, continued non compliance with assistive device (per the pt), and low functional capacity for AMB- these factors are unlikely to improve in the future as the result of physical therapy services, AEB her lack of progress documented in CIR. The patient would be best served with a higher degree of supervision whether in the home with professional caregiver services or LTC placement. While admitted, pt will benefit from skilled PT intervention to preserve independence and safety with basic mobility in preparation for discharge to the venue listed below.        Follow Up Recommendations Home health PT;Supervision/Assistance - 24 hour;Supervision for mobility/OOB;Other (comment)(Functionally stable with poor prognosis for significnant improve over a short timeline; safety most limited by cognitive impairment and chronic orthostatic hypotension, needs close hands on supervision which can be served in the home or LTC setting)    Equipment Recommendations  None recommended by PT    Recommendations for Other Services       Precautions / Restrictions Precautions Precautions:  Fall Precaution Comments: orthostatic hypotension Restrictions Weight Bearing Restrictions: No      Mobility  Bed Mobility Overal bed mobility: Needs Assistance Bed Mobility: Supine to Sit;Sit to Supine     Supine to sit: Supervision Sit to supine: Supervision      Transfers Overall transfer level: Needs assistance Equipment used: 1 person hand held assist Transfers: Sit to/from Stand Sit to Stand: Min assist         General transfer comment: Assisted patient to standing at bedside for orthos; pt tolerates less than 10sec standing before voicing need to sit, reports she felt as though she might pass out. Noted drop in BP per vitals.   Ambulation/Gait Ambulation/Gait assistance: (not appropriate at this time, orthostatic; cognitively impaired. )              Stairs            Wheelchair Mobility    Modified Rankin (Stroke Patients Only)       Balance Overall balance assessment: Needs assistance Sitting-balance support: Feet supported Sitting balance-Leahy Scale: Good     Standing balance support: Single extremity supported Standing balance-Leahy Scale: Poor                               Pertinent Vitals/Pain Pain Assessment: No/denies pain    Home Living Family/patient expects to be discharged to:: Private residence Living Arrangements: Spouse/significant other Available Help at Discharge: Family;Available 24 hours/day Type of Home: House Home Access: Stairs to enter Entrance Stairs-Rails: Can reach both Entrance Stairs-Number of Steps: 4 Home Layout: One level Home Equipment: Other (comment);Walker - 4 wheels(transport chair )      Prior Function Level of Independence: Needs assistance   Gait / Transfers Assistance Needed: ambulates with a rollator walkr per patient  ADL's / Homemaking Assistance Needed: Pt reports she is able to perform ADLs mod I, but spouse has been having to assist with IADL  Comments: patient reports  she has a rollator walkers that she uses for mobility     Hand Dominance   Dominant Hand: Right    Extremity/Trunk Assessment                Communication   Communication: No difficulties  Cognition Arousal/Alertness: Awake/alert Behavior During Therapy: WFL for tasks assessed/performed Overall Cognitive Status: History of cognitive impairments - at baseline(diffuse difficulty with memory recall, insight to safety and functional limitations. Struggles to explain why she is here in the ED. )                                        General Comments      Exercises     Assessment/Plan    PT Assessment Patient needs continued PT services  PT Problem List Decreased strength;Decreased activity tolerance;Decreased balance;Decreased mobility;Decreased safety awareness;Decreased knowledge of precautions;Decreased cognition       PT Treatment Interventions Therapeutic exercise;DME instruction;Gait training;Stair training;Functional mobility training;Therapeutic activities;Patient/family education;Balance training    PT Goals (Current goals can be found in the Care Plan section)  Acute Rehab PT Goals Patient Stated Goal: Return to home  PT Goal Formulation: With patient Time For Goal Achievement: 11/19/18 Potential to Achieve Goals: Fair  Frequency Min 2X/week   Barriers to discharge Decreased caregiver support;Inaccessible home environment      Co-evaluation               AM-PAC PT "6 Clicks" Mobility  Outcome Measure Help needed turning from your back to your side while in a flat bed without using bedrails?: A Little Help needed moving from lying on your back to sitting on the side of a flat bed without using bedrails?: A Little Help needed moving to and from a bed to a chair (including a wheelchair)?: A Little Help needed standing up from a chair using your arms (e.g., wheelchair or bedside chair)?: A Little Help needed to walk in hospital  room?: Total Help needed climbing 3-5 steps with a railing? : Total 6 Click Score: 14    End of Session Equipment Utilized During Treatment: Gait belt Activity Tolerance: Treatment limited secondary to medical complications (Comment)(orthostatic BP drop and presyncopal episode, similar to PTA ) Patient left: in bed;with bed alarm set;with call bell/phone within reach Nurse Communication: Mobility status(DIscussed with MD/CSW) PT Visit Diagnosis: Unsteadiness on feet (R26.81);Other abnormalities of gait and mobility (R26.89);Repeated falls (R29.6);Difficulty in walking, not elsewhere classified (R26.2)    Time: 7262-0355 PT Time Calculation (min) (ACUTE ONLY): 21 min   Charges:   PT Evaluation $PT Eval Moderate Complexity: 1 Mod         4:55 PM, 11/05/18 Etta Grandchild, PT, DPT Physical Therapist - [facility]  [phone] ([place])    [place] C 11/05/2018, 4:44 PM

## 2018-11-05 NOTE — ED Notes (Signed)
Pt awake. No complaints voiced. Warm blanket given. Offered assistance up to the bathroom but did not need at this time. Encouraged to call if any needs.

## 2018-11-05 NOTE — ED Notes (Signed)
Pt asleep, side rails up. Lights dimmed. Blanket covering pt. Breakfast tray placed at bedside. Pt has juice at bedside table from overnight.

## 2018-11-05 NOTE — ED Notes (Signed)
Pt was placed on bedpan to urinate by [facility] EDT.

## 2018-11-05 NOTE — H&P (Signed)
[place] [facility] [place] NAME: Margaret [name]    MR#:  [id number]  DATE OF BIRTH:  [birth year]  DATE OF ADMISSION:  11/04/2018  PRIMARY CARE PHYSICIAN: Baxter Hire, MD   REQUESTING/REFERRING PHYSICIAN: Dr. Merlyn Lot  CHIEF COMPLAINT:   Chief Complaint  Patient presents with  . Dizziness    HISTORY OF PRESENT ILLNESS:  Margaret [name]  is a 78 y.o. female with a known history of paroxysmal atrial fibrillation not on anticoagulation, hypertension, GERD, diabetes, congestive heart failure, CAD status at home with her husband was brought in secondary to worsening weakness. Patient has had a recent hospitalization and rehab stay.  She started getting weaker in the last few days at home.  Daughter called yesterday morning stating that patient was unable to get out of bed and so was brought to the hospital.  Patient denies any fevers or chills.  No nausea or vomiting.  She has poor appetite.  Denies any abdominal pain.  No dysuria but has increased frequency of urination.  Since yesterday she has been in the emergency room receiving IV fluids and awaiting to work with physical therapy.  She is still orthostatically hypotensive when PT try to work with her.  Also had presyncopal episode today.  Blood work indicates dehydration and urinalysis with possible infection.  She is being admitted for the same  PAST MEDICAL HISTORY:   Past Medical History:  Diagnosis Date  . Acetabular fracture ([place])   . Anxiety and depression   . Bartholin cyst   . CAD (coronary artery disease)   . Cancer ([place])    mouth cyst was cancer many years ago   . Cellulitis of right leg 2005  . CHF (congestive heart failure) ([place])   . Colon polyp   . Complication of anesthesia   . Diabetes mellitus without complication ([place])   . DJD (degenerative joint disease)   . Dyspnea on exertion   . Dysrhythmia    PAF  . GERD (gastroesophageal reflux disease)   . Hyperlipidemia    . Hypertension   . Morbid obesity ([place])   . Myocardial infarction ([place]) 06/14/2017  . Ovarian cancer ([place])   . PAF (paroxysmal atrial fibrillation) ([place])    a. diagnosed 7/19; b. CHADS2VASc 8; c. Eliquis  . PONV (postoperative nausea and vomiting)   . Presence of stent in right coronary artery 02/08/12   2.5x18 Xience distal RCA  . Right shoulder pain   . Stroke ([place]) 2017  . Vaginal prolapse     PAST SURGICAL HISTORY:   Past Surgical History:  Procedure Laterality Date  . ABDOMINAL HYSTERECTOMY     supracervical abdominal w/removal tubes &/or ovaries  . BREAST BIOPSY  1989  . CHOLECYSTECTOMY    . COLONOSCOPY    . COLONOSCOPY WITH PROPOFOL N/A 06/21/2018   Procedure: COLONOSCOPY WITH PROPOFOL;  Surgeon: Lollie Sails, MD;  Location: [facility] ENDOSCOPY;  Service: Endoscopy;  Laterality: N/A;  . CORONARY STENT INTERVENTION N/A 06/14/2017   Procedure: CORONARY STENT INTERVENTION;  Surgeon: Wellington Hampshire, MD;  Location: [place] CV LAB;  Service: Cardiovascular;  Laterality: N/A;  . ESOPHAGOGASTRODUODENOSCOPY N/A 06/21/2018   Procedure: ESOPHAGOGASTRODUODENOSCOPY (EGD);  Surgeon: Lollie Sails, MD;  Location: [facility] ENDOSCOPY;  Service: Endoscopy;  Laterality: N/A;  . FACIAL COSMETIC SURGERY     face/neck lift  . FRACTURE SURGERY    . GASTRIC BYPASS  2008  . JOINT REPLACEMENT    . KNEE  SURGERY     left knee arthroscopic  . LAPAROSCOPIC BILATERAL SALPINGO OOPHERECTOMY Bilateral 06/24/2018   Procedure: LAPAROSCOPIC BILATERAL SALPINGO OOPHORECTOMY;  Surgeon: Schermerhorn, Gwen Her, MD;  Location: [facility] ORS;  Service: Gynecology;  Laterality: Bilateral;  . LEFT HEART CATH AND CORONARY ANGIOGRAPHY N/A 06/14/2017   Procedure: LEFT HEART CATH AND CORONARY ANGIOGRAPHY;  Surgeon: Wellington Hampshire, MD;  Location: [place] CV LAB;  Service: Cardiovascular;  Laterality: N/A;  . LEFT HEART CATH AND CORONARY ANGIOGRAPHY N/A 06/14/2017   Procedure: LEFT HEART CATH AND CORONARY  ANGIOGRAPHY;  Surgeon: Wellington Hampshire, MD;  Location: [place] CV LAB;  Service: Cardiovascular;  Laterality: N/A;  . LYSIS OF ADHESION  06/24/2018   Procedure: LYSIS OF ADHESION;  Surgeon: Ouida Sills Gwen Her, MD;  Location: [facility] ORS;  Service: Gynecology;;  . Crisoforo [place]   left hip fracture/repair and left arm fracture  . OTHER SURGICAL HISTORY  1986   hysterectomy  . PORTA CATH INSERTION N/A 07/26/2018   Procedure: PORTA CATH INSERTION;  Surgeon: Katha Cabal, MD;  Location: [place] CV LAB;  Service: Cardiovascular;  Laterality: N/A;  . right knee  late 90's   arthroscopic  . ROUX-EN-Y PROCEDURE    . SALIVARY GLAND SURGERY    . TOTAL HIP ARTHROPLASTY  2004   left    SOCIAL HISTORY:   Social History   Tobacco Use  . Smoking status: Former Smoker    Packs/day: 2.00    Years: 26.00    Pack years: 52.00    Types: Cigarettes    Last attempt to quit: 06/22/1984    Years since quitting: 34.3  . Smokeless tobacco: Never Used  Substance Use Topics  . Alcohol use: Yes    Alcohol/week: 0.0 standard drinks    Frequency: Never    Comment: rare- less than once a month    FAMILY HISTORY:   Family History  Problem Relation Age of Onset  . Heart attack Father        MI age 42 and 37  . Heart attack Sister 76       cabg; deceased 65  . Diabetes Sister   . Hypertension Sister   . Heart attack Brother 51       cabg; deceased 30s  . Diabetes Brother   . Colon cancer Mother 39       deceased 22  . Diabetes Maternal Grandmother   . Cancer Maternal Grandfather        unknown type; deceased  . Hypertension Daughter   . Thyroid disease Daughter   . Breast cancer Neg Hx     DRUG ALLERGIES:   Allergies  Allergen Reactions  . Erythromycin Base Nausea And Vomiting  . Tape Itching, Rash and Other (See Comments)    Also "band-aids"    REVIEW OF SYSTEMS:   Review of Systems  Constitutional: Positive for malaise/fatigue and weight loss. Negative for chills and  fever.  HENT: Negative for ear discharge, ear pain, hearing loss and nosebleeds.   Eyes: Negative for blurred vision, double vision and photophobia.  Respiratory: Negative for cough, hemoptysis, shortness of breath and wheezing.   Cardiovascular: Negative for chest pain, palpitations, orthopnea and leg swelling.  Gastrointestinal: Negative for abdominal pain, constipation, diarrhea, heartburn, melena, nausea and vomiting.  Genitourinary: Positive for frequency. Negative for dysuria, hematuria and urgency.  Musculoskeletal: Positive for falls. Negative for back pain, myalgias and neck pain.  Skin: Negative for rash.  Neurological: Negative for dizziness,  tingling, tremors, sensory change, speech change, focal weakness and headaches.  Endo/Heme/Allergies: Does not bruise/bleed easily.  Psychiatric/Behavioral: Negative for depression.    MEDICATIONS AT HOME:   Prior to Admission medications   Medication Sig Start Date End Date Taking? Authorizing Provider  amLODipine (NORVASC) 2.5 MG tablet Take 1 tablet (2.5 mg total) by mouth daily. 10/25/18 10/20/19 Yes Angiulli, Lavon Paganini, PA-C  atorvastatin (LIPITOR) 80 MG tablet Take 1 tablet (80 mg total) by mouth at bedtime. 10/25/18  Yes Angiulli, Lavon Paganini, PA-C  cyanocobalamin (,VITAMIN B-12,) 1000 MCG/ML injection Inject 1,000 mcg into the muscle every 30 (thirty) days.   Yes [provider]  cyclobenzaprine (FLEXERIL) 10 MG tablet Take 1 tablet (10 mg total) by mouth 3 (three) times daily as needed for muscle spasms. 10/25/18  Yes Angiulli, Lavon Paganini, PA-C  ergocalciferol (VITAMIN D2) 1.25 MG (50000 UT) capsule Take 50,000 Units by mouth every Monday.   Yes [provider]  ezetimibe (ZETIA) 10 MG tablet TAKE 1 TABLET(10 MG) BY MOUTH DAILY 10/25/18  Yes Angiulli, Lavon Paganini, PA-C  ferrous sulfate 325 (65 FE) MG tablet Take 1 tablet (325 mg total) by mouth daily with breakfast. 10/25/18  Yes Angiulli, Lavon Paganini, PA-C  HYDROcodone-acetaminophen  (NORCO/VICODIN) 5-325 MG tablet Take 1 tablet by mouth every 4 (four) hours as needed for moderate pain. 10/25/18  Yes Angiulli, Lavon Paganini, PA-C  losartan (COZAAR) 100 MG tablet Take 1 tablet (100 mg total) by mouth daily. 10/25/18  Yes Angiulli, Lavon Paganini, PA-C  megestrol (MEGACE) 400 MG/10ML suspension Take 10 mLs (400 mg total) by mouth 2 (two) times daily. 10/25/18  Yes Angiulli, Lavon Paganini, PA-C  metoprolol tartrate (LOPRESSOR) 25 MG tablet Take 1 tablet (25 mg total) by mouth 2 (two) times daily. 10/25/18  Yes Angiulli, Lavon Paganini, PA-C  Multiple Vitamin (MULTIVITAMIN WITH MINERALS) TABS tablet Take 1 tablet by mouth daily. 01/14/18  Yes Demetrios Loll, MD  Multiple Vitamins-Minerals (OCUVITE PO) Take 1 capsule by mouth daily.   Yes [provider]  omeprazole (PRILOSEC) 20 MG capsule Take 1 capsule (20 mg total) by mouth daily. 10/25/18  Yes Angiulli, Lavon Paganini, PA-C  potassium chloride (K-DUR) 10 MEQ tablet Take 10 mEq by mouth daily.   Yes [provider]  sertraline (ZOLOFT) 100 MG tablet Take 1 tablet (100 mg total) by mouth daily. 10/25/18  Yes Angiulli, Lavon Paganini, PA-C  acetaminophen (TYLENOL) 325 MG tablet Take 2 tablets (650 mg total) by mouth every 4 (four) hours as needed for mild pain. 10/25/18   Angiulli, Lavon Paganini, PA-C  cephALEXin (KEFLEX) 250 MG capsule Take 1 capsule (250 mg total) by mouth every 8 (eight) hours for 10 days. 11/04/18 11/14/18  Arta Silence, MD  feeding supplement, ENSURE ENLIVE, (ENSURE ENLIVE) LIQD Take 237 mLs by mouth 2 (two) times daily between meals. 10/25/18   Angiulli, Lavon Paganini, PA-C  nitroGLYCERIN (NITROSTAT) 0.4 MG SL tablet Place 1 tablet (0.4 mg total) under the tongue every 5 (five) minutes as needed for chest pain. 06/16/17   Demetrios Loll, MD  ondansetron (ZOFRAN) 4 MG tablet Take 1 tablet (4 mg total) by mouth every 8 (eight) hours as needed for nausea or vomiting. 10/25/18   Angiulli, Lavon Paganini, PA-C  protein supplement shake (PREMIER PROTEIN) LIQD Take 325  mLs (11 oz total) by mouth 2 (two) times daily between meals. Patient not taking: Reported on 09/29/2018 01/14/18   Demetrios Loll, MD  senna-docusate (SENNA S) 8.6-50 MG tablet Take 1 tablet  by mouth daily.     [provider]      VITAL SIGNS:  Blood pressure (!) 143/82, pulse 95, temperature 97.8 F (36.6 C), temperature source Oral, resp. rate 20, height 5\' 8"  (1.727 m), weight 63.8 kg, SpO2 100 %.  PHYSICAL EXAMINATION:  Physical Exam  GENERAL:  78 y.o.-year-old  Elderly patient lying in the bed with no acute distress.  EYES: Pupils equal, round, reactive to light and accommodation. No scleral icterus. Extraocular muscles intact.  HEENT: Head atraumatic, normocephalic. Oropharynx and nasopharynx clear.  NECK:  Supple, no jugular venous distention. No thyroid enlargement, no tenderness.  LUNGS: Normal breath sounds bilaterally, no wheezing, rales,rhonchi or crepitation. No use of accessory muscles of respiration. Decreased bibasilar breath sounds CARDIOVASCULAR: S1, S2 normal. No  rubs, or gallops. 2/6 systolic murmur present ABDOMEN: Soft, nontender, nondistended. Bowel sounds present. No organomegaly or mass.  EXTREMITIES: No pedal edema, cyanosis, or clubbing.  NEUROLOGIC: Cranial nerves II through XII are intact. Muscle strength 5/5 in all extremities. Sensation intact. Gait not checked. Global weakness PSYCHIATRIC: The patient is alert and oriented x 3.  SKIN: No obvious rash, lesion, or ulcer.   LABORATORY PANEL:   CBC Recent Labs  Lab 11/04/18 1039  WBC 9.0  HGB 10.5*  HCT 32.9*  PLT 210   ------------------------------------------------------------------------------------------------------------------  Chemistries  Recent Labs  Lab 11/04/18 1039  NA 143  K 3.4*  CL 112*  CO2 19*  GLUCOSE 93  BUN 26*  CREATININE 0.93  CALCIUM 9.2  AST 24  ALT 15  ALKPHOS 160*  BILITOT 1.1    ------------------------------------------------------------------------------------------------------------------  Cardiac Enzymes Recent Labs  Lab 11/04/18 1039  TROPONINI <0.03   ------------------------------------------------------------------------------------------------------------------  RADIOLOGY:  No results found.    IMPRESSION AND PLAN:   Neila Teem  is a 78 y.o. female with a known history of paroxysmal atrial fibrillation not on anticoagulation, hypertension, GERD, diabetes, congestive heart failure, CAD status at home with her husband was brought in secondary to worsening weakness.  1.  Falls and weakness-likely secondary to underlying UTI.  IV fluids ordered. -Orthostatic blood pressure changes to be checked -Physical therapy consult.  2.  Acute cystitis-cultures have been sent for.  Grew E. coli resistant to fluoroquinolones in April 2020. -Currently on Rocephin.  3.  Hypertension-patient on low-dose Norvasc, metoprolol and losartan.  However monitor blood pressure and hold if hypotensive  4.  Hyperlipidemia-continue home medications  5.  DVT prophylaxis-Lovenox.    All the records are reviewed and case discussed with ED provider. Management plans discussed with the patient, family and they are in agreement.  CODE STATUS: Full Code  TOTAL TIME TAKING CARE OF THIS PATIENT: 51 minutes.    Gladstone Lighter M.D on 11/05/2018 at 4:04 PM  Between 7am to 6pm - Pager - [phone]  After 6pm go to [email or web address] - Proofreader  [facility]  Hospital[facility]  Office  [phone]  CC: Primary care physician; Baxter Hire, MD   Note: This dictation was prepared with Dragon dictation along with smaller phrase technology. Any transcriptional errors that result from this process are unintentional.

## 2018-11-05 NOTE — Social Work (Signed)
TOC CM/SW received call from ED nurse requesting update on pt's discharge plan. Social worker in the process of making telephone contact with family regarding discharge plan.   Berenice Bouton, MSW, LCSW  [phone] 8am-6pm (weekends) or CSW ED # [phone]

## 2018-11-05 NOTE — ED Notes (Signed)
Called dietary to get Ensure sent to ED at this time

## 2018-11-05 NOTE — ED Notes (Signed)
Pt given cup of water. Offered toileting, denied needing to use toilet at this time. Denies any needs at this time. Informed meal tray is beside her when she becomes hungry. Pt stated she would go back to sleep after taking meds.

## 2018-11-05 NOTE — Progress Notes (Signed)
   [place] [facility] Day: 0 days Margaret [name] is a 78 y.o. female presenting with Dizziness .   Advance care planning discussed with patient at bedside. She was alert and oriented and able was able to participate in the discussion.   All questions in regards to overall condition and expected prognosis answered.  She mentioned that she has been asked this question before and wants to remain a full code. The decision was made to continue current code status  CODE STATUS: Full code Time spent: 18 minutes

## 2018-11-05 NOTE — ED Notes (Signed)
ED MD stated that pt needs to see PT prior to being DC home. Will wait for PT evaluation. PT stated they would be at bedside after lunch, about 1:30.

## 2018-11-05 NOTE — ED Notes (Signed)
Called pt husband to inform him of admission.

## 2018-11-05 NOTE — ED Notes (Addendum)
This RN had called CSW for an update. CSW came to bedside and talked to this RN who stated that pt wants to go home, husband wants pt to go home. CSW states she had talked to husband. CSW stated that pt has home health lined up and they are waiting on pt to come home. Informed Dr. Quentin Cornwall about CSW.

## 2018-11-05 NOTE — Progress Notes (Signed)
  Pt presyncopal in standing with PT during evaluation. Orthostatic BP drop as documented below.    11/05/18 1414  Orthostatic Lying   BP- Lying 129/79  Pulse- Lying 71  Orthostatic Sitting  BP- Sitting 99/71  Pulse- Sitting 79  Orthostatic Standing at 0 minutes  BP- Standing at 0 minutes 91/64 (taken in sitting, pt tolerates stnading for <10 sec prior to feeling presyncopal. )  Pulse- Standing at 0 minutes 89    2:18 PM, 11/05/18 Etta Grandchild, PT, DPT Physical Therapist - [facility]  [phone] [facility])

## 2018-11-05 NOTE — ED Notes (Signed)
PT at bedside.

## 2018-11-05 NOTE — ED Notes (Signed)
Sat pt up to eat some lunch. Awaiting Ensure from dietary. Informed pt that she is waiting for PT evaluation.

## 2018-11-06 LAB — CBC
HCT: 29.7 % — ABNORMAL LOW (ref 36.0–46.0)
Hemoglobin: 9.6 g/dL — ABNORMAL LOW (ref 12.0–15.0)
MCH: 31.4 pg (ref 26.0–34.0)
MCHC: 32.3 g/dL (ref 30.0–36.0)
MCV: 97.1 fL (ref 80.0–100.0)
Platelets: 180 10*3/uL (ref 150–400)
RBC: 3.06 MIL/uL — ABNORMAL LOW (ref 3.87–5.11)
RDW: 15.8 % — ABNORMAL HIGH (ref 11.5–15.5)
WBC: 7.1 10*3/uL (ref 4.0–10.5)
nRBC: 0 % (ref 0.0–0.2)

## 2018-11-06 LAB — BASIC METABOLIC PANEL
Anion gap: 10 (ref 5–15)
BUN: 17 mg/dL (ref 8–23)
CO2: 18 mmol/L — ABNORMAL LOW (ref 22–32)
Calcium: 8.9 mg/dL (ref 8.9–10.3)
Chloride: 115 mmol/L — ABNORMAL HIGH (ref 98–111)
Creatinine, Ser: 0.71 mg/dL (ref 0.44–1.00)
GFR calc Af Amer: >60 mL/min (ref >60)
GFR calc non Af Amer: >60 mL/min (ref >60)
Glucose, Bld: 80 mg/dL (ref 70–99)
Potassium: 3.2 mmol/L — ABNORMAL LOW (ref 3.5–5.1)
Sodium: 143 mmol/L (ref 135–145)

## 2018-11-06 MED ORDER — ENSURE ENLIVE PO LIQD
237.0000 mL | Freq: Three times a day (TID) | ORAL | Status: DC
  Administered 2018-11-07: 237 mL via ORAL

## 2018-11-06 MED ORDER — POTASSIUM CHLORIDE CRYS ER 20 MEQ PO TBCR
40.0000 meq | EXTENDED_RELEASE_TABLET | Freq: Once | ORAL | Status: AC
  Administered 2018-11-06: 14:00:00 40 meq via ORAL
  Filled 2018-11-06: qty 2

## 2018-11-06 MED ORDER — ADULT MULTIVITAMIN W/MINERALS CH
1.0000 | ORAL_TABLET | Freq: Every day | ORAL | Status: DC
  Administered 2018-11-06 – 2018-11-07 (×2): 1 via ORAL
  Filled 2018-11-06: qty 1

## 2018-11-06 NOTE — TOC Initial Note (Signed)
Transition of Care [facility]) - Initial/Assessment Note    Patient Details  Name: Margaret [name] MRN: [id number] Date of Birth: [birth year]  Transition of Care [facility]) CM/SW Contact:    Latanya Maudlin, RN Phone Number: 11/06/2018, 8:47 AM  Clinical Narrative:   TOC was following case in the ED this Friday. Plan was for patient to discharge home with the resumption of home health via [facility]. Margaret [name] was aware and the spouse was set to pick patient up from Emergency Room. However, daughter who is an Hydrographic surveyor here has voiced many safety concerns about a home discharge. This Probation officer has collaborated with members of the ED social work team and PT to coordinate plan and complete assessment. Per spouse he feels capable of managing patient at home and has adamantly refused placement. It is noted that PT recommended home health as patient just had a CIR stay and did not progress significantly enough to show that overall that she is capable to regain significant strength from short term rehab. Per PT the more viable option would be return to home with home health if supports are adequate or to seek long term care placement. Margaret [name] from [place] made a point to the [facility] that patient has not done well with goals in the home. Daughter, Margaret [name], feels that patient cannot be properly cared for by the spouse. Unfortunately, [name] has some confusion and lacks total capacity for decision making it seems. Patient has lift chair, bedside comoode and rolling walker in the home. Patient has continued medical barriers but TOC will follow for disposition. Margaret [name] aware of admission.                  Expected Discharge Plan: [place] Barriers to Discharge: Continued Medical Work up   Patient Goals and CMS Choice   CMS Medicare.gov Compare Post Acute Care list provided to:: Patient Choice offered to / list presented to : Patient, Spouse  Expected Discharge Plan and Services Expected Discharge  Plan: [place]   Discharge Planning Services: CM Consult Post Acute Care Choice: [place] arrangements for the past 2 months: Single Family Home Expected Discharge Date: 11/08/18                           [facility] Agency: [facility] Date [place]: 11/06/18 Time [place]: [phone] Representative spoke with at [place]: teresa  Prior Living Arrangements/Services Living arrangements for the past 2 months: [place] with:: Spouse Patient language and need for interpreter reviewed:: No Do you feel safe going back to the place where you live?: Yes          Current home services: DME, Homehealth aide, Home RN Criminal Activity/Legal Involvement Pertinent to Current Situation/Hospitalization: No - Comment as needed  Activities of Daily Living Home Assistive Devices/Equipment: Wheelchair, Shower chair without back, Grab bars in shower, Environmental consultant (specify type) ADL Screening (condition at time of admission) Patient's cognitive ability adequate to safely complete daily activities?: Yes Is the patient deaf or have difficulty hearing?: No Does the patient have difficulty seeing, even when wearing glasses/contacts?: No Does the patient have difficulty concentrating, remembering, or making decisions?: No Patient able to express need for assistance with ADLs?: Yes Does the patient have difficulty dressing or bathing?: No Independently performs ADLs?: Yes (appropriate for developmental age) Communication: Independent Dressing (OT): Independent Grooming: Independent Feeding: Independent  Bathing: Independent Toileting: Independent In/Out Bed: Independent Walks in Home: Independent with device (comment) Is this a change from baseline?: Pre-admission baseline Does the patient have difficulty walking or climbing stairs?: Yes Weakness of Legs: None Weakness of Arms/Hands: None  Permission  Sought/Granted Permission sought to share information with : Case Manager, Other (comment)(PT,SW)                Emotional Assessment Appearance:: Appears stated age Attitude/Demeanor/Rapport: Engaged   Orientation: : Oriented to Self, Oriented to Place      Admission diagnosis:  Orthostasis [I95.1] Urinary tract infection without hematuria, site unspecified [N39.0] Patient Active Problem List   Diagnosis Date Noted  . Acute cystitis 11/05/2018  . Hypokalemia   . Chronic combined systolic and diastolic congestive heart failure ([place])   . Anemia of chronic disease   . Acute combined systolic and diastolic congestive heart failure ([place])   . Traumatic subdural hematoma ([place]) 10/12/2018  . Acute blood loss anemia   . Diabetes mellitus type 2 in nonobese (HCC)   . Malignant neoplasm of ovary ([place])   . PAF (paroxysmal atrial fibrillation) ([place])   . Chronic diastolic congestive heart failure ([place])   . Coronary artery disease involving native coronary artery of native heart without angina pectoris   . Subarachnoid hemorrhage ([place]) 10/08/2018  . Right ovarian cyst 08/12/2018  . Accelerated hypertension 07/31/2018  . Elevated lactic acid level 07/31/2018  . Hypertensive urgency 07/31/2018  . Goals of care, counseling/discussion 07/22/2018  . Primary high grade serous adenocarcinoma of ovary ([place]) 07/13/2018  . Controlled type 2 diabetes mellitus without complication, without long-term current use of insulin ([place]) 02/02/2018  . Bradycardia 01/13/2018  . Essential hypertension 11/04/2017  . Other hyperlipidemia 11/04/2017  . Hx of gastric bypass 11/04/2017  . NSTEMI (non-ST elevated myocardial infarction) ([place]) 06/14/2017  . History of ischemic right PCA stroke 03/27/2016  . Hemianopia, homonymous, left 01/31/2016  . Stroke ([place]) 11/06/2015  . Arthritis of knee, degenerative 05/04/2013  . Diastolic dysfunction 50/35/4656  . CAD S/P percutaneous coronary angioplasty   . Morbid  obesity ([place])   . Right shoulder pain   . Anxiety and depression   . Dyspnea on exertion   . Cellulitis of right leg    PCP:  Baxter Hire, MD Pharmacy:   [facility], [place] [address] Phone: [phone] Fax: [phone]     Social Determinants of Health (SDOH) Interventions    Readmission Risk Interventions Readmission Risk Prevention Plan 11/06/2018 10/11/2018  Transportation Screening Complete Complete  PCP or Specialist Appt within 3-5 Days - Complete  HRI or [place] - Complete  Social Work Consult for [place] Planning/Counseling - Complete  Palliative Care Screening - Not Complete  Palliative Care Screening Not Complete Comments - may be worth considering PMT involvement given multiple hx  Medication Review (RN Care Manager) Complete Complete  HRI or Home Care Consult Complete -  Some recent data might be hidden

## 2018-11-06 NOTE — Progress Notes (Signed)
[place] [facility] [place] NAME: Margaret [name]    MR#:  [id number]  DATE OF BIRTH:  08-08-1940  SUBJECTIVE:  CHIEF COMPLAINT:   Chief Complaint  Patient presents with  . Dizziness   No new complaint this morning.  No fevers.  Denies any dizziness.  Being treated for urinary tract infection.  REVIEW OF SYSTEMS:  Review of Systems  Constitutional: Negative for chills and fever.  HENT: Negative for hearing loss and tinnitus.   Eyes: Negative for blurred vision and double vision.  Respiratory: Negative for cough and hemoptysis.   Cardiovascular: Negative for chest pain and palpitations.  Gastrointestinal: Negative for heartburn, nausea and vomiting.  Genitourinary: Negative for dysuria and urgency.  Musculoskeletal: Negative for myalgias and neck pain.  Skin: Negative for itching and rash.  Neurological: Negative for dizziness and headaches.  Psychiatric/Behavioral: Negative for depression and hallucinations.    DRUG ALLERGIES:   Allergies  Allergen Reactions  . Erythromycin Base Nausea And Vomiting  . Tape Itching, Rash and Other (See Comments)    Also "band-aids"   VITALS:  Blood pressure (!) 139/104, pulse (!) 105, temperature 98.6 F (37 C), temperature source Oral, resp. rate 18, height 5\' 8"  (1.727 m), weight 61 kg, SpO2 100 %. PHYSICAL EXAMINATION:  GENERAL:  78 y.o.-year-old  Elderly patient lying in the bed with no acute distress.  EYES: Pupils equal, round, reactive to light and accommodation. No scleral icterus. Extraocular muscles intact.  HEENT: Head atraumatic, normocephalic. Oropharynx and nasopharynx clear.  NECK:  Supple, no jugular venous distention. No thyroid enlargement, no tenderness.  LUNGS: Normal breath sounds bilaterally, no wheezing, rales,rhonchi or crepitation. No use of accessory muscles of respiration. Decreased bibasilar breath sounds CARDIOVASCULAR: S1, S2 normal. No  rubs, or gallops. 2/6 systolic  murmur present ABDOMEN: Soft, nontender, nondistended. Bowel sounds present. No organomegaly or mass.  EXTREMITIES: No pedal edema, cyanosis, or clubbing.  NEUROLOGIC: Cranial nerves II through XII are intact. Muscle strength 5/5 in all extremities. Sensation intact. Gait not checked. Global weakness PSYCHIATRIC: The patient is alert and oriented x 3.  SKIN: No obvious rash, lesion, or ulcer.   LABORATORY PANEL:  Female CBC Recent Labs  Lab 11/06/18 0416  WBC 7.1  HGB 9.6*  HCT 29.7*  PLT 180   ------------------------------------------------------------------------------------------------------------------ Chemistries  Recent Labs  Lab 11/04/18 1039 11/06/18 0416  NA 143 143  K 3.4* 3.2*  CL 112* 115*  CO2 19* 18*  GLUCOSE 93 80  BUN 26* 17  CREATININE 0.93 0.71  CALCIUM 9.2 8.9  AST 24  --   ALT 15  --   ALKPHOS 160*  --   BILITOT 1.1  --    RADIOLOGY:  No results found. ASSESSMENT AND PLAN:   Margaret [name]  is a 78 y.o. female with a known history of paroxysmal atrial fibrillation not on anticoagulation, hypertension, GERD, diabetes, congestive heart failure, CAD status at home with her husband was brought in secondary to worsening weakness.  1.  Falls and weakness-likely secondary to underlying UTI. Being hydrated with IV fluids. Seen by physical therapist.  Recommendation was for home health with physical therapy on discharge.  2.  Acute cystitis-cultures have been sent for.  Grew E. coli resistant to fluoroquinolones in April 2020. -Currently on Rocephin pending results of urine culture and sensitivities..  3.  Hypertension-patient on low-dose Norvasc, metoprolol and losartan.  However monitor blood pressure and hold if hypotensive  4.  Hyperlipidemia-continue home  medications  5.  Hypokalemia; replaced   DVT prophylaxis-Lovenox.  All the records are reviewed and case discussed with Care Management/Social Worker. Management plans discussed with the  patient, family and they are in agreement. I called and updated patient's husband over the phone.  Possible discharge tomorrow pending clinical course and culture results.  CODE STATUS: Full Code  TOTAL TIME TAKING CARE OF THIS PATIENT: 65minutes.   More than 50% of the time was spent in counseling/coordination of care: YES  POSSIBLE D/C IN 1 DAY, DEPENDING ON CLINICAL CONDITION.   Korrin Waterfield M.D on 11/06/2018 at 1:01 PM  Between 7am to 6pm - Pager - [phone]  After 6pm go to [email or web address] - Proofreader  [facility]  Office  [phone]  CC: Primary care physician; Baxter Hire, MD  Note: This dictation was prepared with Dragon dictation along with smaller phrase technology. Any transcriptional errors that result from this process are unintentional.

## 2018-11-06 NOTE — Plan of Care (Signed)

## 2018-11-06 NOTE — Progress Notes (Signed)
Initial Nutrition Assessment  RD working remotely.  DOCUMENTATION CODES:   Not applicable  INTERVENTION:  Recommend liberalizing to regular diet.  Increase to Ensure Enlive po TID, each supplement provides 350 kcal and 20 grams of protein.  Provide daily MVI.  NUTRITION DIAGNOSIS:   Inadequate oral intake related to decreased appetite as evidenced by 23.5% weight loss over 3 months.  GOAL:   Patient will meet greater than or equal to 90% of their needs  MONITOR:   PO intake, Supplement acceptance, Labs, Weight trends, Skin, I & O's  REASON FOR ASSESSMENT:   Malnutrition Screening Tool    ASSESSMENT:   78 year old female with PMHx of HTN, HLD, anxiety, depression, CAD, GERD, paroxysmal A-fib, DM, CHF, hx CVA 2017, hx gastric bypass 2008, recently admitted for CIR following traumatic SAH who is now admitted with falls and weakness, acute cystitis.   Attempted to call patient's phone multiple times this morning but she was unable to pick up. Patient was recently at CIR due to decreased functional mobility following traumatic SAH. During that admission she had a poor appetite and was only eating 25-50% of her meals. There is also report of poor appetite prior to CIR admission. No meal documentation completed at this time fur current hospitalization. Patient was compliant with oral nutrition supplements during CIR.  Per chart patient was 79.7 kg on 08/01/2018. She is currently 61 kg (134.48 lbs). She has lost 18.7 kg (23.5% body weight) over the past 3 months, which is significant for time frame.   Medications reviewed and include: Megace 400 mg BID, pantoprazole, potassium chloride 40 mEq once today, senna-docusate 1 tablet daily, sertraline 100 mg daily, NS @ 75 mL/hr, ceftriaxone.  Labs reviewed: Potassium 3.2, Chloride 115, CO2 18. Last HgbA1c was 7.4 on 01/12/2018.  Patient is at risk for malnutrition, but unable to confirm if patient is malnourished without completing  NFPE.  NUTRITION - FOCUSED PHYSICAL EXAM:  Unable to complete at this time.  Diet Order:   Diet Order            Diet Heart Room service appropriate? Yes; Fluid consistency: Thin  Diet effective now             EDUCATION NEEDS:   Not appropriate for education at this time  Skin:  Skin Assessment: Reviewed RN Assessment(ecchymosis)  Last BM:  Unknown/PTA  Height:   Ht Readings from Last 1 Encounters:  11/05/18 5\' 8"  (1.727 m)   Weight:   Wt Readings from Last 1 Encounters:  11/05/18 61 kg   Ideal Body Weight:  63.6 kg  BMI:  Body mass index is 20.45 kg/m.  Estimated Nutritional Needs:   Kcal:  1700-1900  Protein:  90-105 grams  Fluid:  1.7-1.9 L/day  Willey Blade, MS, RD, LDN Office: [phone] Pager: [phone] After Hours/Weekend Pager: [phone]

## 2018-11-06 NOTE — Progress Notes (Signed)
Pt refusing morning medication at this time. This nurse also attempted to give medications at 0930. Pt states " I would like to sleep for now. I will take them after I rest." Will continue to monitor and try again later.

## 2018-11-07 ENCOUNTER — Encounter: Payer: PPO | Admitting: Registered Nurse

## 2018-11-07 LAB — BASIC METABOLIC PANEL
Anion gap: 9 (ref 5–15)
BUN: 13 mg/dL (ref 8–23)
CO2: 17 mmol/L — ABNORMAL LOW (ref 22–32)
Calcium: 8.7 mg/dL — ABNORMAL LOW (ref 8.9–10.3)
Chloride: 119 mmol/L — ABNORMAL HIGH (ref 98–111)
Creatinine, Ser: 0.82 mg/dL (ref 0.44–1.00)
GFR calc Af Amer: >60 mL/min (ref >60)
GFR calc non Af Amer: >60 mL/min (ref >60)
Glucose, Bld: 139 mg/dL — ABNORMAL HIGH (ref 70–99)
Potassium: 3 mmol/L — ABNORMAL LOW (ref 3.5–5.1)
Sodium: 145 mmol/L (ref 135–145)

## 2018-11-07 LAB — URINE CULTURE: Culture: >=100000 — AB

## 2018-11-07 MED ORDER — POTASSIUM CHLORIDE CRYS ER 20 MEQ PO TBCR
40.0000 meq | EXTENDED_RELEASE_TABLET | Freq: Once | ORAL | Status: AC
  Administered 2018-11-07: 15:00:00 40 meq via ORAL
  Filled 2018-11-07: qty 2

## 2018-11-07 MED ORDER — HEPARIN SOD (PORK) LOCK FLUSH 100 UNIT/ML IV SOLN
500.0000 [IU] | Freq: Once | INTRAVENOUS | Status: AC
  Administered 2018-11-07: 15:00:00 500 [IU] via INTRAVENOUS
  Filled 2018-11-07: qty 5

## 2018-11-07 MED ORDER — CEFDINIR 300 MG PO CAPS: 300.0000 mg | ORAL_CAPSULE | Freq: Two times a day (BID) | ORAL | 0 refills | Status: AC

## 2018-11-07 NOTE — Discharge Summary (Signed)
[facility] NAME: Margaret [name]    MR#:  [id number]  DATE OF BIRTH:  Feb 06, 1941  DATE OF ADMISSION:  11/04/2018   ADMITTING PHYSICIAN: Gladstone Lighter, MD  DATE OF DISCHARGE: 11/07/2018  PRIMARY CARE PHYSICIAN: Baxter Hire, MD   ADMISSION DIAGNOSIS:  Orthostasis [I95.1] Urinary tract infection without hematuria, site unspecified [N39.0] DISCHARGE DIAGNOSIS:  Active Problems:   Acute cystitis  SECONDARY DIAGNOSIS:   Past Medical History:  Diagnosis Date   Acetabular fracture ([place])    Anxiety and depression    [place] cyst    CAD (coronary artery disease)    Cancer ([place])    mouth cyst was cancer many years ago    Cellulitis of right leg 2005   CHF (congestive heart failure) (HCC)    Colon polyp    Complication of anesthesia    Diabetes mellitus without complication (HCC)    DJD (degenerative joint disease)    Dyspnea on exertion    Dysrhythmia    PAF   GERD (gastroesophageal reflux disease)    Hyperlipidemia    Hypertension    Morbid obesity ([place])    Myocardial infarction ([place]) 06/14/2017   Ovarian cancer ([place])    PAF (paroxysmal atrial fibrillation) ([place])    a. diagnosed 7/19; b. CHADS2VASc 8; c. Eliquis   PONV (postoperative nausea and vomiting)    Presence of stent in right coronary artery 02/08/12   2.5x18 Xience distal RCA   Right shoulder pain    Stroke [facility]) 2017   Vaginal prolapse    HOSPITAL COURSE:  Chief complaint; dizziness  History of presenting complaint; Margaret [name]  is a 78 y.o. female with a known history of paroxysmal atrial fibrillation not on anticoagulation, hypertension, GERD, diabetes, congestive heart failure, CAD status at home with her husband was brought in secondary to worsening weakness. Patient has had a recent hospitalization and rehab stay.  She started getting weaker in the last few days at home.  Daughter called stating that patient was  unable to get out of bed and so was brought to the hospital.  Patient denies any fevers or chills.  No nausea or vomiting.  She has poor appetite.  Denies any abdominal pain.  Patient was noted to be orthostatic in the emergency room.  Diagnosed with urinary tract infection.  Admitted to medical service for further evaluation and management.  Hospital course; 1. Falls and weakness-likely secondary to underlying UTI. Patient was adequately hydrated with IV fluids during this admission.  Patient apparently had been to rehab recently.  I called and discussed with patient's husband who confirmed patient has been mostly bedbound.  On review of the notes on discussion with physical therapist as well as discussion with patient's husband, patient appears not to have benefited much from rehab previously.  Husband states patient is mostly bedbound.  Orthostatic hypotension appears to be chronic.  Prescription for teds stockings given.  Educated patient and husband extensively on modalities to prevent falls but gradually changing from laying to sitting and standing position.  Notified by case manager as well that patient's insurance would not cover rehab at this time.  Case manager setting up home health with physical therapy, home health and nursing aide on discharge.  Husband and patient wishes to have patient discharged home today.  Advised patient's husband that patient needs supervision to prevent falls at home.  2. Acute cystitis- Urine culture positive for Enterobacter Cloquet sensitive to Rocephin.  Discussed with pharmacist.  Patient being discharged home on cefdinir 300 mg p.o. twice daily to complete treatment duration.    3. Hypertension- Patient does have orthostatic hypotension.  Modalities to prevent falls discussed with patient and husband.  Continue current blood pressure meds.  Follow-up with primary care physician for monitoring   4. Hyperlipidemia-continue home medications  5.   Hypokalemia; replaced   DISCHARGE CONDITIONS:  Stable CONSULTS OBTAINED:   DRUG ALLERGIES:   Allergies  Allergen Reactions   Erythromycin Base Nausea And Vomiting   Tape Itching, Rash and Other (See Comments)    Also "band-aids"   DISCHARGE MEDICATIONS:   Allergies as of 11/07/2018      Reactions   Erythromycin Base Nausea And Vomiting   Tape Itching, Rash, Other (See Comments)   Also "band-aids"      Medication List    TAKE these medications   acetaminophen 325 MG tablet Commonly known as:  TYLENOL Take 2 tablets (650 mg total) by mouth every 4 (four) hours as needed for mild pain.   amLODipine 2.5 MG tablet Commonly known as:  NORVASC Take 1 tablet (2.5 mg total) by mouth daily.   atorvastatin 80 MG tablet Commonly known as:  LIPITOR Take 1 tablet (80 mg total) by mouth at bedtime.   cefdinir 300 MG capsule Commonly known as:  OMNICEF Take 1 capsule (300 mg total) by mouth 2 (two) times daily for 5 days.   cyanocobalamin 1000 MCG/ML injection Commonly known as:  (VITAMIN B-12) Inject 1,000 mcg into the muscle every 30 (thirty) days.   cyclobenzaprine 10 MG tablet Commonly known as:  FLEXERIL Take 1 tablet (10 mg total) by mouth 3 (three) times daily as needed for muscle spasms.   ergocalciferol 1.25 MG (50000 UT) capsule Commonly known as:  VITAMIN D2 Take 50,000 Units by mouth every Monday.   ezetimibe 10 MG tablet Commonly known as:  ZETIA TAKE 1 TABLET(10 MG) BY MOUTH DAILY   feeding supplement (ENSURE ENLIVE) Liqd Take 237 mLs by mouth 2 (two) times daily between meals.   ferrous sulfate 325 (65 FE) MG tablet Take 1 tablet (325 mg total) by mouth daily with breakfast.   HYDROcodone-acetaminophen 5-325 MG tablet Commonly known as:  NORCO/VICODIN Take 1 tablet by mouth every 4 (four) hours as needed for moderate pain.   losartan 100 MG tablet Commonly known as:  COZAAR Take 1 tablet (100 mg total) by mouth daily.   megestrol 400 MG/10ML  suspension Commonly known as:  MEGACE Take 10 mLs (400 mg total) by mouth 2 (two) times daily.   metoprolol tartrate 25 MG tablet Commonly known as:  LOPRESSOR Take 1 tablet (25 mg total) by mouth 2 (two) times daily.   multivitamin with minerals Tabs tablet Take 1 tablet by mouth daily.   nitroGLYCERIN 0.4 MG SL tablet Commonly known as:  NITROSTAT Place 1 tablet (0.4 mg total) under the tongue every 5 (five) minutes as needed for chest pain.   OCUVITE PO Take 1 capsule by mouth daily.   omeprazole 20 MG capsule Commonly known as:  PRILOSEC Take 1 capsule (20 mg total) by mouth daily.   ondansetron 4 MG tablet Commonly known as:  ZOFRAN Take 1 tablet (4 mg total) by mouth every 8 (eight) hours as needed for nausea or vomiting.   potassium chloride 10 MEQ tablet Commonly known as:  K-DUR Take 10 mEq by mouth daily.   protein supplement shake Liqd Commonly known as:  PREMIER PROTEIN Take  325 mLs (11 oz total) by mouth 2 (two) times daily between meals.   Senna S 8.6-50 MG tablet Generic drug:  senna-docusate Take 1 tablet by mouth daily.   sertraline 100 MG tablet Commonly known as:  ZOLOFT Take 1 tablet (100 mg total) by mouth daily.        DISCHARGE INSTRUCTIONS:   DIET:  Cardiac diet DISCHARGE CONDITION:  Stable ACTIVITY:  Patient said to be mostly bedbound at baseline by husband.  Requires 24-hour assistance and supervision to prevent falls. OXYGEN:  Home Oxygen: No.  Oxygen Delivery: room air DISCHARGE LOCATION:  home   If you experience worsening of your admission symptoms, develop shortness of breath, life threatening emergency, suicidal or homicidal thoughts you must seek medical attention immediately by calling 911 or calling your MD immediately  if symptoms less severe.  You Must read complete instructions/literature along with all the possible adverse reactions/side effects for all the Medicines you take and that have been prescribed to you.  Take any new Medicines after you have completely understood and accpet all the possible adverse reactions/side effects.   Please note  You were cared for by a hospitalist during your hospital stay. If you have any questions about your discharge medications or the care you received while you were in the hospital after you are discharged, you can call the unit and asked to speak with the hospitalist on call if the hospitalist that took care of you is not available. Once you are discharged, your primary care physician will handle any further medical issues. Please note that NO REFILLS for any discharge medications will be authorized once you are discharged, as it is imperative that you return to your primary care physician (or establish a relationship with a primary care physician if you do not have one) for your aftercare needs so that they can reassess your need for medications and monitor your lab values.    On the day of Discharge:  VITAL SIGNS:  Blood pressure (!) 155/143, pulse (!) 102, temperature 98.3 F (36.8 C), temperature source Oral, resp. rate 20, height 5\' 8"  (1.727 m), weight 61 kg, SpO2 100 %. PHYSICAL EXAMINATION:  GENERAL:  78 y.o.-year-old patient lying in the bed with no acute distress.  EYES: Pupils equal, round, reactive to light and accommodation. No scleral icterus. Extraocular muscles intact.  HEENT: Head atraumatic, normocephalic. Oropharynx and nasopharynx clear.  NECK:  Supple, no jugular venous distention. No thyroid enlargement, no tenderness.  LUNGS: Normal breath sounds bilaterally, no wheezing, rales,rhonchi or crepitation. No use of accessory muscles of respiration.  CARDIOVASCULAR: S1, S2 normal. No murmurs, rubs, or gallops.  ABDOMEN: Soft, non-tender, non-distended. Bowel sounds present. No organomegaly or mass.  EXTREMITIES: No pedal edema, cyanosis, or clubbing.  NEUROLOGIC: Generalized weakness with no focal deficit.  Sensation intact. Gait not checked.    PSYCHIATRIC: The patient is alert and oriented x 3.  SKIN: No obvious rash, lesion, or ulcer.  DATA REVIEW:   CBC Recent Labs  Lab 11/06/18 0416  WBC 7.1  HGB 9.6*  HCT 29.7*  PLT 180    Chemistries  Recent Labs  Lab 11/04/18 1039  11/07/18 1023  NA 143   < > 145  K 3.4*   < > 3.0*  CL 112*   < > 119*  CO2 19*   < > 17*  GLUCOSE 93   < > 139*  BUN 26*   < > 13  CREATININE 0.93   < > 0.82  CALCIUM 9.2   < > 8.7*  AST 24  --   --   ALT 15  --   --   ALKPHOS 160*  --   --   BILITOT 1.1  --   --    < > = values in this interval not displayed.     Microbiology Results  Results for orders placed or performed during the hospital encounter of 11/04/18  Urine culture     Status: Abnormal   Collection Time: 11/04/18 10:39 AM  Result Value Ref Range Status   Specimen Description   Final    URINE, RANDOM Performed at [facility], [address], [place], [place] [address]    Special Requests   Final    NONE Performed at [facility], [address] [place], [place] [address]    Culture >=100,000 COLONIES/mL ENTEROBACTER CLOACAE (A)  Final   Report Status 11/07/2018 FINAL  Final   Organism ID, Bacteria ENTEROBACTER CLOACAE (A)  Final      Susceptibility   Enterobacter cloacae - MIC*    CEFAZOLIN >=64 RESISTANT Resistant     CEFTRIAXONE <=1 SENSITIVE Sensitive     CIPROFLOXACIN <=0.25 SENSITIVE Sensitive     GENTAMICIN <=1 SENSITIVE Sensitive     IMIPENEM 1 SENSITIVE Sensitive     NITROFURANTOIN 128 RESISTANT Resistant     TRIMETH/SULFA <=20 SENSITIVE Sensitive     PIP/TAZO <=4 SENSITIVE Sensitive     * >=100,000 COLONIES/mL ENTEROBACTER CLOACAE  SARS Coronavirus 2 (CEPHEID - Performed in [place] hospital lab), Hosp Order     Status: None   Collection Time: 11/05/18  2:51 PM  Result Value Ref Range Status   SARS Coronavirus 2 NEGATIVE NEGATIVE Final    Comment: (NOTE) If result is NEGATIVE SARS-CoV-2 target nucleic acids are NOT  DETECTED. The SARS-CoV-2 RNA is generally detectable in upper and lower  respiratory specimens during the acute phase of infection. The lowest  concentration of SARS-CoV-2 viral copies this assay can detect is 250  copies / mL. A negative result does not preclude SARS-CoV-2 infection  and should not be used as the sole basis for treatment or other  patient management decisions.  A negative result may occur with  improper specimen collection / handling, submission of specimen other  than nasopharyngeal swab, presence of viral mutation(s) within the  areas targeted by this assay, and inadequate number of viral copies  (<250 copies / mL). A negative result must be combined with clinical  observations, patient history, and epidemiological information. If result is POSITIVE SARS-CoV-2 target nucleic acids are DETECTED. The SARS-CoV-2 RNA is generally detectable in upper and lower  respiratory specimens dur ing the acute phase of infection.  Positive  results are indicative of active infection with SARS-CoV-2.  Clinical  correlation with patient history and other diagnostic information is  necessary to determine patient infection status.  Positive results do  not rule out bacterial infection or co-infection with other viruses. If result is PRESUMPTIVE POSTIVE SARS-CoV-2 nucleic acids MAY BE PRESENT.   A presumptive positive result was obtained on the submitted specimen  and confirmed on repeat testing.  While 2019 novel coronavirus  (SARS-CoV-2) nucleic acids may be present in the submitted sample  additional confirmatory testing may be necessary for epidemiological  and / or clinical management purposes  to differentiate between  SARS-CoV-2 and other Sarbecovirus currently known to infect humans.  If clinically indicated additional testing with an alternate test  methodology [phone])  is advised. The SARS-CoV-2 RNA is generally  detectable in upper and lower respiratory sp ecimens during  the acute  phase of infection. The expected result is Negative. Fact Sheet for Patients:  StrictlyIdeas.no Fact Sheet for Healthcare Providers: BankingDealers.co.za This test is not yet approved or cleared by the [place] FDA and has been authorized for detection and/or diagnosis of SARS-CoV-2 by FDA under an Emergency Use Authorization (EUA).  This EUA will remain in effect (meaning this test can be used) for the duration of the COVID-19 declaration under Section 564(b)(1) of the Act, 21 U.S.C. section 360bbb-3(b)(1), unless the authorization is terminated or revoked sooner. Performed at [facility], [address] [place], [place] [address]     RADIOLOGY:  No results found.   Management plans discussed with the patient, family and they are in agreement.  CODE STATUS: Full Code   TOTAL TIME TAKING CARE OF THIS PATIENT: 38 minutes.    Jimena Wieczorek M.D on 11/07/2018 at 11:52 AM  Between 7am to 6pm - Pager - [phone]  After 6pm go to [email or web address] - Proofreader  [facility]  Office  [phone]  CC: Primary care physician; Baxter Hire, MD   Note: This dictation was prepared with Dragon dictation along with smaller phrase technology. Any transcriptional errors that result from this process are unintentional.

## 2018-11-07 NOTE — Progress Notes (Addendum)
Physical Therapy Treatment Patient Details Name: Margaret [name] MRN: [id number] DOB: [birth year] Today's Date: 11/07/2018    History of Present Illness Arabel Barcenas is a 78yo female who comes to [facility] on 5/15 with dizziness, DTR unable to get patient OOB for a medical appointment. DTR reports pt has been falling. PMH: CAD s/p stenting, dCHF, NIDDM2, PAF, ovarian CA on chemotherapy, recent fall and bilat [place] (4/21) now s/p 13d CIR c DC to home on 5/5. Pt reports she has been falling more, but denies acute weakness, denies LOC, denies seizure activity, reports that she simply trips or loses balance, and often has limited compliance of RW use. Pt also presenting with memory difficulty, which she reports is baseline, but deines any acute mentation changes. Review of acute stay/CIR notes demonstrate poor progression and consistentcy of AMB capability typically tolerating ~39ft per bout with RW, and as far as 57ft. Multiple sessions with CIR PT also note orthostatic BP limitations, as well as well documented cogntiive impairment, need for frequet reorientation, and need for supervision for safety. Pt has had [facility] services since DC to home 10DA. She lives with husband who is not physically capable of providing physical assist as needed, unclear to what degree he has been successful in monitoring patient safety given falls and noncompliance with assistive device. DTR, who is an Therapist, sports, voices concerns about patient safety at home, whereas pt's husband feels capable of caring for patient in the home.     PT Comments    Pt in bed finishing breakfast, agreeable to participate. Pt appears slightly more lucid this date with a mild noted improvement in memory recall, less word finding difficulty in session, but continues to have difficulty in verbalizing symptoms associate with BP drop until she is near her peak tolerance. Pt continues to demonstrate mildly poor insight into her current impairment and ability to extrapolate  problem solving in activity modification for improving safety at home given her recurrent medical issues with near-syncope. Pt ultimately is limited to performance of transfers in session due to BP drop, however given patient's AMB status upon leaving CIR after extensive rehab wherein AMB >16ft was rare, Pryor Curia does not believe it safe for pt to be performing any AMB in the home without minGuard assistance. It is unsafe to require maximal distance AMB for basic ADL several times each day.   11/07/18 0949  Therapy Vitals  Patient Position (if appropriate) Orthostatic Vitals  Orthostatic Lying   BP- Lying 131/60  Pulse- Lying 72  Orthostatic Sitting  BP- Sitting 102/61  Pulse- Sitting 82  Orthostatic Standing at 0 minutes  BP- Standing at 0 minutes (!) 80/69 (Pt tolerates <15seconds, returns to sitting 2/2 dizziness, feeling faint)  Pulse- Standing at 0 minutes 108      Follow Up Recommendations  Home health PT;Supervision/Assistance - 24 hour;Supervision for mobility/OOB;Other (comment)(No indicators that rehab services will address current safety risk factors of orthostasis, low functional capacity, and poor safety awareness/insight)     Equipment Recommendations  None recommended by PT    Recommendations for Other Services       Precautions / Restrictions Precautions Precautions: Fall Precaution Comments: orthostatic hypotension Restrictions Weight Bearing Restrictions: No    Mobility  Bed Mobility Overal bed mobility: Independent       Supine to sit: Independent Sit to supine: Independent      Transfers Overall transfer level: Needs assistance Equipment used: 1 person hand held assist Transfers: Sit to/from [facility] Sit to Stand: (<15sec tolerance  while trying to assess BP) Stand pivot transfers: (bed to/from [facility] )          Ambulation/Gait Ambulation/Gait assistance: (did not attempt, pt continues to have symptomatic orthostasis. )                Stairs             Wheelchair Mobility    Modified Rankin (Stroke Patients Only)       Balance Overall balance assessment: Needs assistance Sitting-balance support: Feet supported Sitting balance-Leahy Scale: Normal     Standing balance support: During functional activity;Single extremity supported Standing balance-Leahy Scale: Fair                              Cognition Arousal/Alertness: Awake/alert Behavior During Therapy: WFL for tasks assessed/performed Overall Cognitive Status: History of cognitive impairments - at baseline(appears to be A&Ox4 this date, but has concernign low level of concern for safety risks upon DC to home. )                                        Exercises      General Comments        Pertinent Vitals/Pain Pain Assessment: No/denies pain    Home Living                      Prior Function            PT Goals (current goals can now be found in the care plan section) Acute Rehab PT Goals Patient Stated Goal: Return to home  PT Goal Formulation: With patient Time For Goal Achievement: 11/19/18 Potential to Achieve Goals: Fair Progress towards PT goals: Progressing toward goals    Frequency    Min 2X/week      PT Plan Current plan remains appropriate    Co-evaluation              AM-PAC PT "6 Clicks" Mobility   Outcome Measure  Help needed turning from your back to your side while in a flat bed without using bedrails?: A Little Help needed moving from lying on your back to sitting on the side of a flat bed without using bedrails?: A Little Help needed moving to and from a bed to a chair (including a wheelchair)?: A Little Help needed standing up from a chair using your arms (e.g., wheelchair or bedside chair)?: A Little Help needed to walk in hospital room?: Total Help needed climbing 3-5 steps with a railing? : Total 6 Click Score: 14    End of Session    Activity Tolerance: Treatment limited secondary to medical complications (Comment) Patient left: in bed;with bed alarm set;with call bell/phone within reach Nurse Communication: Mobility status(MD/RN made aware of symptomatic orthostasis) PT Visit Diagnosis: Unsteadiness on feet (R26.81);Other abnormalities of gait and mobility (R26.89);Repeated falls (R29.6);Difficulty in walking, not elsewhere classified (R26.2)     Time: 1884-1660 PT Time Calculation (min) (ACUTE ONLY): 18 min  Charges:  $Therapeutic Exercise: 8-22 mins                     10:23 AM, 11/07/18 Etta Grandchild, PT, DPT Physical Therapist - [facility]  [phone] ([place])     [place] C 11/07/2018, 10:17 AM

## 2018-11-07 NOTE — Plan of Care (Signed)

## 2018-11-07 NOTE — TOC Transition Note (Signed)
Transition of Care [facility]) - CM/SW Discharge Note   Patient Details  Name: Margaret [name] MRN: [id number] Date of Birth: 20-Dec-1940  Transition of [facility]) CM/SW Contact:  Annamaria Boots, [place] Phone Number: 11/07/2018, 12:16 PM   Clinical Narrative:  Patient is medically ready for discharge today. Patient and husband continue to want to return home with home health through [facility]. CSW notified Helene Kelp with [facility] of discharge today. Patient's husband will transport. Patient has no DME needs.      Final next level of care: Home w Home Health Services Barriers to Discharge: No Barriers Identified   Patient Goals and CMS Choice   CMS Medicare.gov Compare Post Acute Care list provided to:: Patient Represenative (must comment)(Husband ) Choice offered to / list presented to : Spouse  Discharge Placement                    Patient and family notified of of transfer: 11/07/18  Discharge Plan and Services   Discharge Planning Services: CM Consult Post Acute Care Choice: Home Health                    HH Arranged: RN, PT, Nurse's Aide [place] Agency: [facility] (formerly [employer]) Date [place]: 11/07/18 Time [place]: 1215 Representative spoke with at [place]: [place] ([place]) Interventions     Readmission Risk Interventions Readmission Risk Prevention Plan 11/06/2018 10/11/2018  Transportation Screening Complete Complete  PCP or Specialist Appt within 3-5 Days - Complete  HRI or [place] - Complete  Social Work Consult for [place] Planning/Counseling - Complete  Palliative Care Screening - Not Complete  Palliative Care Screening Not Complete Comments - may be worth considering PMT involvement given multiple hx  Medication Review Press photographer) Complete Complete  HRI or Home Care Consult Complete -  Some recent data might be hidden

## 2018-11-07 NOTE — Progress Notes (Addendum)
  Author in room for PT treatment. Pt presented with feeling faint upon standing for BP assessment, tolerates <15sec standing prior to need to sit.     11/07/18 0949  Therapy Vitals  Patient Position (if appropriate) Orthostatic Vitals  Orthostatic Lying   BP- Lying 131/60  Pulse- Lying 72  Orthostatic Sitting  BP- Sitting 102/61  Pulse- Sitting 82  Orthostatic Standing at 0 minutes  BP- Standing at 0 minutes (!) 80/69 (Pt tolerates <15seconds, returns to sitting 2/2 dizziness, feeling faint)  Pulse- Standing at 0 minutes 108    9:52 AM, 11/07/18 Etta Grandchild, PT, DPT Physical Therapist - [facility]  [phone] [facility])

## 2018-11-09 ENCOUNTER — Telehealth: Payer: PPO | Admitting: Cardiology

## 2018-11-09 DIAGNOSIS — Z85818 Personal history of malignant neoplasm of other sites of lip, oral cavity, and pharynx: Secondary | ICD-10-CM

## 2018-11-09 DIAGNOSIS — K219 Gastro-esophageal reflux disease without esophagitis: Secondary | ICD-10-CM | POA: Diagnosis not present

## 2018-11-09 DIAGNOSIS — I480 Paroxysmal atrial fibrillation: Secondary | ICD-10-CM | POA: Diagnosis not present

## 2018-11-09 DIAGNOSIS — I252 Old myocardial infarction: Secondary | ICD-10-CM | POA: Diagnosis not present

## 2018-11-09 DIAGNOSIS — I2510 Atherosclerotic heart disease of native coronary artery without angina pectoris: Secondary | ICD-10-CM | POA: Diagnosis not present

## 2018-11-09 DIAGNOSIS — Z86010 Personal history of colonic polyps: Secondary | ICD-10-CM | POA: Diagnosis not present

## 2018-11-09 DIAGNOSIS — I951 Orthostatic hypotension: Secondary | ICD-10-CM | POA: Diagnosis not present

## 2018-11-09 DIAGNOSIS — E785 Hyperlipidemia, unspecified: Secondary | ICD-10-CM

## 2018-11-09 DIAGNOSIS — I5030 Unspecified diastolic (congestive) heart failure: Secondary | ICD-10-CM | POA: Diagnosis not present

## 2018-11-09 DIAGNOSIS — G4700 Insomnia, unspecified: Secondary | ICD-10-CM

## 2018-11-09 DIAGNOSIS — E119 Type 2 diabetes mellitus without complications: Secondary | ICD-10-CM | POA: Diagnosis not present

## 2018-11-09 DIAGNOSIS — C569 Malignant neoplasm of unspecified ovary: Secondary | ICD-10-CM | POA: Diagnosis not present

## 2018-11-09 DIAGNOSIS — F419 Anxiety disorder, unspecified: Secondary | ICD-10-CM | POA: Diagnosis not present

## 2018-11-09 DIAGNOSIS — Z9884 Bariatric surgery status: Secondary | ICD-10-CM

## 2018-11-09 DIAGNOSIS — Z8673 Personal history of transient ischemic attack (TIA), and cerebral infarction without residual deficits: Secondary | ICD-10-CM

## 2018-11-09 DIAGNOSIS — Z6822 Body mass index (BMI) 22.0-22.9, adult: Secondary | ICD-10-CM

## 2018-11-09 DIAGNOSIS — B9689 Other specified bacterial agents as the cause of diseases classified elsewhere: Secondary | ICD-10-CM | POA: Diagnosis not present

## 2018-11-09 DIAGNOSIS — S066X0D Traumatic subarachnoid hemorrhage without loss of consciousness, subsequent encounter: Secondary | ICD-10-CM | POA: Diagnosis not present

## 2018-11-09 DIAGNOSIS — I110 Hypertensive heart disease with heart failure: Secondary | ICD-10-CM | POA: Diagnosis not present

## 2018-11-09 DIAGNOSIS — M1991 Primary osteoarthritis, unspecified site: Secondary | ICD-10-CM | POA: Diagnosis not present

## 2018-11-09 DIAGNOSIS — R262 Difficulty in walking, not elsewhere classified: Secondary | ICD-10-CM | POA: Diagnosis not present

## 2018-11-09 DIAGNOSIS — Z87891 Personal history of nicotine dependence: Secondary | ICD-10-CM

## 2018-11-09 DIAGNOSIS — D509 Iron deficiency anemia, unspecified: Secondary | ICD-10-CM | POA: Diagnosis not present

## 2018-11-09 DIAGNOSIS — R531 Weakness: Secondary | ICD-10-CM | POA: Diagnosis not present

## 2018-11-09 DIAGNOSIS — F329 Major depressive disorder, single episode, unspecified: Secondary | ICD-10-CM | POA: Diagnosis not present

## 2018-11-09 DIAGNOSIS — Z6841 Body Mass Index (BMI) 40.0 and over, adult: Secondary | ICD-10-CM | POA: Diagnosis not present

## 2018-11-09 DIAGNOSIS — N3000 Acute cystitis without hematuria: Secondary | ICD-10-CM | POA: Diagnosis not present

## 2018-11-09 DIAGNOSIS — I509 Heart failure, unspecified: Secondary | ICD-10-CM | POA: Diagnosis not present

## 2018-11-09 DIAGNOSIS — Z9181 History of falling: Secondary | ICD-10-CM | POA: Diagnosis not present

## 2018-11-10 ENCOUNTER — Other Ambulatory Visit: Payer: Self-pay | Admitting: *Deleted

## 2018-11-10 NOTE — Patient Outreach (Addendum)
[facility]) Care Management  11/10/2018  Chandrea Zellman 05/14/1941 [id number]   Subjective: Telephone call to patient's home number, spoke with patient, and HIPAA verified.  Discussed [facility] Care Management [employer] EMMI General Discharge Red Alert Flag follow up, patient voiced understanding, and is in agreement to follow up.  Patient states she is doing well and remembers receiving EMMI automated calls.  Patient also gave verbal consent to speak with her husband Cataleah Stites) regarding her healthcare needs as needed.  Spoke with patient and husband throughout call, patient has discharge instructions, does not have any questions regarding discharge instructions,  and has scheduled follow up appointments does not have access to the exact date at this time.  Husband states patient has received all prescribed medications and taking as prescribed. Patient is currently receiving home health physical therapy from [facility] during this conversation.  Husband states he will follow up to confirm patient's follow up appointment dates / time with primary MD (Dr. Edwina Barth) and cardiologist (Dr. Martinique).  Husband states patient is also active with [facility].   Patient states she is able to manage self care and has assistance as needed. Husband voices understanding of patient's medical diagnosis and treatment plan.  Patient states she does not have any education material, EMMI follow up, care coordination, care management, disease monitoring, transportation, community resource, or pharmacy needs at this time.  States she is very appreciative of the follow up.   RNCM sent the following secure email update to [facility]: I have received and completed a general EMMI Red Flag Alert follow up on the below patient.   Patient and husband to follow up with primary MD and cardiologist regarding follow up appointment confirmation.  Abimbola Aki Female, 78 y.o., 1940/12/02 MRN:  [id number] Thanks,   Colbert Coyer.  Burt Knack RN, BSN, CCM   Received the following secure email from [facility]: Thanks! Scarlette Ar, BSN, RN  Nurse Care Manager  [place]         TGGYIR:[phone]  KKXF:[phone]  [email or web address]   Objective: Per KPN (Knowledge Performance Now, point of care tool) and chart review, patient hospitalized  11/04/2018 - 11/07/2018 for Acute cystitis.  Patient also has a history of CAD (coronary artery disease), Cancer (mouth), Cellulitis of right leg, CHF (congestive heart failure), diabetes, DJD (degenerative joint disease), Hyperlipidemia, Hypertension, Myocardial infarction, Ovarian cancer, and Stroke.          Assessment:  Received [employer] EMMI General Discharge Red Alert Flag follow up referral on 11/10/2018.   Red Flag Alert Triggers times 2, Day #1, patient answered yes to the following question: Questions about discharge papers?  Patient answered no to the following question: Scheduled follow-up? EMMI follow up completed and no further care management needs.      Plan: RNCM will complete case closure due to follow up completed / no care management needs.     Raford Brissett H. Annia Friendly, BSN, [place] Management [facility] Telephonic CM Phone: [phone] Fax: [phone]

## 2018-11-15 ENCOUNTER — Other Ambulatory Visit: Payer: Self-pay | Admitting: *Deleted

## 2018-11-15 NOTE — Patient Outreach (Signed)
[facility]) Care Management  11/15/2018  Margaret [name] 1941/02/02 [id number]   Subjective: Telephone call to patient's home  number, spoke with patient, and HIPAA verified.  Discussed [facility] Care Management [employer] EMMI General Discharge Red Flag Alert follow up, patient voiced understanding, and is in agreement to follow up.  Patient states she is doing good, remembers speaking with this RNCM in the past, remembers receiving EMMI automated calls, has not lost interest in things, and does not feel sad/ hopeless/ anxious, empty, and the automated calls are very difficult to answer.   States she has a Counselling psychologist from [place] in her home at this time.  Patient states she does not have any education material, EMMI follow up, care coordination, care management, disease monitoring, transportation, community resource, or pharmacy needs at this time. States she is very appreciative of the follow up.  RNCM sent the following secure email update to [facility]: I have received and completed a general EMMI Red Flag Alert follow up on the below patient.   Patient states she has not lost interest in things, and does not feel sad/ hopeless/ anxious, empty.  Margaret [name] Female, 78 y.o., [birth year] MRN:  [id number] Thanks, Colbert Coyer.  Burt Knack Therapist, sports, Copywriter, advertising, CCM  [employer]  [employer], Telephonic [address]  [place], [place] [address] Direct Dial: [phone]   Fax: [phone] Website:  [email or web address]   Objective: Per KPN (Knowledge Performance Now, point of care tool) and chart review, patient hospitalized  11/04/2018 - 11/07/2018 for Acute cystitis.  Patient also has a history of CAD (coronary artery disease), Cancer (mouth), Cellulitis of right leg, CHF (congestive heart failure), diabetes, DJD (degenerative joint disease), Hyperlipidemia, Hypertension, Myocardial infarction,  Ovarian cancer, and Stroke.          Assessment:  Received [employer] EMMI General Discharge Red Alert Flag follow up referral on 11/15/2018.   Red Flag Alert Triggers times 2, Day #4, patient answered yes to the following question:Lost interest in things?  Sad/hopeless/anxious/empty?   EMMI follow up completed and no further care management needs.      Plan: RNCM will complete case closure due to follow up completed / no care management needs.     Wayden Schwertner H. Annia Friendly, BSN, [place] Management [facility] Telephonic CM Phone: [phone] Fax: [phone]

## 2018-11-16 ENCOUNTER — Inpatient Hospital Stay: Payer: PPO | Attending: Obstetrics and Gynecology

## 2018-11-16 ENCOUNTER — Other Ambulatory Visit: Payer: Self-pay | Admitting: *Deleted

## 2018-11-16 ENCOUNTER — Ambulatory Visit: Payer: PPO

## 2018-11-16 NOTE — Patient Outreach (Signed)
[facility]) Care Management  11/16/2018  Margaret [name] 1941/02/18 [id number]   Received the following secure email from [facility] regarding update sent on 11/15/2018:  Thanks so much.  I will pass this along to her team!!  Scarlette Ar, BSN, [email or web address]   Plan: Case will remain closed and no [facility] Care Management needs at this time.   Rael Yo H. Annia Friendly, BSN, [place] Management [facility] Telephonic CM Phone: [phone] Fax: [phone]

## 2018-11-18 ENCOUNTER — Encounter: Payer: Self-pay | Admitting: Obstetrics and Gynecology

## 2018-11-22 DIAGNOSIS — I2510 Atherosclerotic heart disease of native coronary artery without angina pectoris: Secondary | ICD-10-CM

## 2018-11-22 DIAGNOSIS — I509 Heart failure, unspecified: Secondary | ICD-10-CM | POA: Diagnosis not present

## 2018-11-22 DIAGNOSIS — Z9181 History of falling: Secondary | ICD-10-CM | POA: Diagnosis not present

## 2018-11-22 DIAGNOSIS — F329 Major depressive disorder, single episode, unspecified: Secondary | ICD-10-CM | POA: Diagnosis not present

## 2018-11-22 DIAGNOSIS — I252 Old myocardial infarction: Secondary | ICD-10-CM | POA: Diagnosis not present

## 2018-11-22 DIAGNOSIS — M1991 Primary osteoarthritis, unspecified site: Secondary | ICD-10-CM

## 2018-11-22 DIAGNOSIS — R262 Difficulty in walking, not elsewhere classified: Secondary | ICD-10-CM | POA: Diagnosis not present

## 2018-11-22 DIAGNOSIS — I110 Hypertensive heart disease with heart failure: Secondary | ICD-10-CM | POA: Diagnosis not present

## 2018-11-22 DIAGNOSIS — E119 Type 2 diabetes mellitus without complications: Secondary | ICD-10-CM | POA: Diagnosis not present

## 2018-11-22 DIAGNOSIS — Z86010 Personal history of colonic polyps: Secondary | ICD-10-CM

## 2018-11-22 DIAGNOSIS — R531 Weakness: Secondary | ICD-10-CM

## 2018-11-22 DIAGNOSIS — Z8673 Personal history of transient ischemic attack (TIA), and cerebral infarction without residual deficits: Secondary | ICD-10-CM | POA: Diagnosis not present

## 2018-11-22 DIAGNOSIS — E785 Hyperlipidemia, unspecified: Secondary | ICD-10-CM | POA: Diagnosis not present

## 2018-11-22 DIAGNOSIS — I480 Paroxysmal atrial fibrillation: Secondary | ICD-10-CM

## 2018-11-22 DIAGNOSIS — F419 Anxiety disorder, unspecified: Secondary | ICD-10-CM

## 2018-11-22 DIAGNOSIS — I951 Orthostatic hypotension: Secondary | ICD-10-CM | POA: Diagnosis not present

## 2018-11-22 DIAGNOSIS — B9689 Other specified bacterial agents as the cause of diseases classified elsewhere: Secondary | ICD-10-CM | POA: Diagnosis not present

## 2018-11-22 DIAGNOSIS — N3000 Acute cystitis without hematuria: Secondary | ICD-10-CM | POA: Diagnosis not present

## 2018-11-22 DIAGNOSIS — Z6841 Body Mass Index (BMI) 40.0 and over, adult: Secondary | ICD-10-CM | POA: Diagnosis not present

## 2018-11-23 ENCOUNTER — Telehealth: Payer: Self-pay | Admitting: *Deleted

## 2018-11-23 NOTE — Telephone Encounter (Signed)
Would it be possible for me to see her once? From oncology standpoint alone it is hard totjustify hospice as she is not metastatic. I havent seen her for a long time. If I see her I can probably justofy putting together her recent complications. Thanks, Astrid Divine

## 2018-11-23 NOTE — Telephone Encounter (Signed)
Melissa will contact family to have them call for an appointment to see Dr Janese Banks

## 2018-11-23 NOTE — Telephone Encounter (Signed)
Margaret [name] from [facility] stating [facility] referred her to them, She is asking if Dr Janese Banks will sign orders and be attending fore this patient if she is appropriate for Hospice. Her PCP Dr Tessa Lerner did not want to sign orders and wanted Onc to be asked if she is appropriate for Hospice. She states she faxed orders last night. Please advise.

## 2018-11-23 NOTE — Telephone Encounter (Signed)
Daughter called stating that she is not comfortable trying to get her mother to the car and that it is very difficult to just get her to the [facility]. She is asking if the visit Dr Janese Banks wants could be a Face time visit instead of office visit. Please advise

## 2018-11-23 NOTE — Telephone Encounter (Signed)
I can do video visit her on Friday 11/24/18 in the morning 9 am. Margaret [name] can you please arrange that?

## 2018-11-24 ENCOUNTER — Encounter: Payer: Self-pay | Admitting: Oncology

## 2018-11-24 ENCOUNTER — Inpatient Hospital Stay: Payer: PPO | Attending: Oncology | Admitting: Oncology

## 2018-11-24 DIAGNOSIS — Z7189 Other specified counseling: Secondary | ICD-10-CM

## 2018-11-24 DIAGNOSIS — Z79899 Other long term (current) drug therapy: Secondary | ICD-10-CM | POA: Diagnosis not present

## 2018-11-24 DIAGNOSIS — Z87891 Personal history of nicotine dependence: Secondary | ICD-10-CM | POA: Diagnosis not present

## 2018-11-24 DIAGNOSIS — C569 Malignant neoplasm of unspecified ovary: Secondary | ICD-10-CM

## 2018-11-24 DIAGNOSIS — I609 Nontraumatic subarachnoid hemorrhage, unspecified: Secondary | ICD-10-CM

## 2018-11-24 NOTE — Progress Notes (Signed)
Patient and daughter feel that hospice is warranted for her due to the stroke and with her cancer unknown status but if she got worse she would not be candidate for any more chemo. They have been in touch with community hospice

## 2018-11-25 NOTE — Progress Notes (Signed)
I connected with Margaret [name] on 11/25/18 at  2:45 PM EDT by video enabled telemedicine visit and verified that I am speaking with the correct person using two identifiers.   I discussed the limitations, risks, security and privacy concerns of performing an evaluation and management service by telemedicine and the availability of in-person appointments. I also discussed with the patient that there may be a patient responsible charge related to this service. The patient expressed understanding and agreed to proceed.  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  She was on Eliquis and Plavix.  Patient was diagnosed with high-grade serous carcinoma of the ovary in January 2020 and underwent bilateral salpingo-oophorectomy.  Given serous histology plan was to give adjuvant chemotherapy with carboplatin and Taxol for 6 cycles.  Patient received fourth cycle of carbotaxol on 09/29/2018.  Taxol dose was reduced for that cycle given some early onset of peripheral neuropathy in her extremities.  A week or so after getting chemotherapy patient had a fall in her driveway and was confused.  She was not taken to the hospital at that time and when her daughter came to check on her she found that patient was unable to get out of bed and was taken to the hospital where she was found to have bilateral subarachnoid hemorrhage this was treated conservatively and patient was sent to her subacute rehab.  Since then patient's condition has progressively deteriorated patient was again hospitalized  in May 2020 for progressive weakness and urinary tract infection.  Since her subarachnoid hemorrhage patient has had a consistent weight loss and her weight went down from 152 pounds in April 2020 down to 122 pounds during recent hospitalization in May 2020.  Patient was eventually discharged home to be with family.  This is my first video visit with the patient since I last saw her in April 2020.  Patient's daughter Margaret [name] who is at the conversation today states that patient is essentially bedbound and has lost further weight.  It takes considerable effort for the family to get her out of bed to a bedside commode.  She has been barely eating any food and drinks very small amounts of liquids.  Patient is conversant today and appears much frailer and seems to have lost significant weight.  Patient's family is interested in pursuing hospice  Interval history: Patient denies any pain at this time.  Her appetite is poor   Review of Systems  Constitutional: Positive for malaise/fatigue and weight loss. Negative for chills and fever.       Loss of appetite  HENT: Negative for congestion, ear discharge and nosebleeds.   Eyes: Negative for blurred vision.  Respiratory: Negative for cough, hemoptysis, sputum production, shortness of breath and wheezing.   Cardiovascular: Negative for chest pain, palpitations, orthopnea and claudication.  Gastrointestinal: Negative for abdominal pain, blood in stool, constipation, diarrhea, heartburn, melena, nausea and vomiting.  Genitourinary: Negative for dysuria, flank pain, frequency, hematuria and urgency.  Musculoskeletal: Negative for back pain, joint pain and myalgias.  Skin: Negative for rash.  Neurological: Negative for dizziness, tingling, focal weakness, seizures, weakness and headaches.  Endo/Heme/Allergies: Does not bruise/bleed easily.  Psychiatric/Behavioral: Negative for depression and suicidal ideas. The patient does not have insomnia.     Allergies   Allergen Reactions  . Erythromycin Base Nausea And Vomiting  . Tape Itching, Rash and Other (See Comments)    Also "band-aids"    Past Medical History:  Diagnosis Date  . Acetabular fracture ([place])   . Anxiety and depression   . Bartholin cyst   . CAD (coronary artery disease)   . Cancer ([place])    mouth cyst was cancer many years ago   . Cellulitis of right leg 2005  . CHF (congestive heart failure) ([place])   . Colon polyp   . Complication of anesthesia   . Diabetes mellitus without complication ([place])   . DJD (degenerative joint disease)   . Dyspnea on exertion   . Dysrhythmia    PAF  . GERD (gastroesophageal reflux disease)   . Hyperlipidemia   . Hypertension   . Morbid obesity ([place])   . Myocardial infarction ([place]) 06/14/2017  . Ovarian cancer ([place])   . PAF (paroxysmal atrial fibrillation) ([place])    a. diagnosed 7/19; b. CHADS2VASc 8; c. Eliquis  . PONV (postoperative nausea and vomiting)   . Presence of stent in right coronary artery 02/08/12   2.5x18 Xience distal RCA  . Right shoulder pain   . Stroke ([place]) 2017  . Vaginal prolapse     Past Surgical History:  Procedure Laterality Date  . ABDOMINAL HYSTERECTOMY     supracervical abdominal w/removal tubes &/or ovaries  . BREAST BIOPSY  1989  . CHOLECYSTECTOMY    . COLONOSCOPY    . COLONOSCOPY WITH PROPOFOL N/A 06/21/2018   Procedure: COLONOSCOPY WITH PROPOFOL;  Surgeon: Lollie Sails, MD;  Location: [facility] ENDOSCOPY;  Service: Endoscopy;  Laterality: N/A;  . CORONARY STENT INTERVENTION N/A 06/14/2017   Procedure: CORONARY STENT INTERVENTION;  Surgeon: Wellington Hampshire, MD;  Location: [place] CV LAB;  Service: Cardiovascular;  Laterality: N/A;  . ESOPHAGOGASTRODUODENOSCOPY N/A 06/21/2018   Procedure: ESOPHAGOGASTRODUODENOSCOPY (EGD);  Surgeon: Lollie Sails, MD;  Location: [facility] ENDOSCOPY;  Service: Endoscopy;  Laterality: N/A;  . FACIAL COSMETIC SURGERY     face/neck lift  . FRACTURE SURGERY    .  GASTRIC BYPASS  2008  . JOINT REPLACEMENT    . KNEE SURGERY     left knee arthroscopic  . LAPAROSCOPIC BILATERAL SALPINGO OOPHERECTOMY Bilateral 06/24/2018   Procedure: LAPAROSCOPIC BILATERAL SALPINGO OOPHORECTOMY;  Surgeon: Schermerhorn, Gwen Her, MD;  Location: [facility] ORS;  Service: Gynecology;  Laterality: Bilateral;  . LEFT HEART CATH AND CORONARY ANGIOGRAPHY N/A 06/14/2017   Procedure: LEFT HEART CATH AND CORONARY ANGIOGRAPHY;  Surgeon: Wellington Hampshire, MD;  Location: [place] CV LAB;  Service: Cardiovascular;  Laterality: N/A;  . LEFT HEART CATH AND CORONARY ANGIOGRAPHY N/A 06/14/2017   Procedure: LEFT HEART CATH AND CORONARY ANGIOGRAPHY;  Surgeon: Wellington Hampshire, MD;  Location: [place] CV LAB;  Service: Cardiovascular;  Laterality: N/A;  . LYSIS OF ADHESION  06/24/2018   Procedure: LYSIS OF ADHESION;  Surgeon: Ouida Sills Gwen Her, MD;  Location: [facility] ORS;  Service: Gynecology;;  . Crisoforo [place]   left hip fracture/repair and left arm fracture  . OTHER SURGICAL HISTORY  1986   hysterectomy  . PORTA CATH INSERTION N/A 07/26/2018   Procedure: PORTA CATH INSERTION;  Surgeon: Katha Cabal, MD;  Location: [place] CV LAB;  Service: Cardiovascular;  Laterality: N/A;  . right knee  late 90's   arthroscopic  . ROUX-EN-Y PROCEDURE    . SALIVARY GLAND SURGERY    . TOTAL HIP ARTHROPLASTY  2004   left    Social History   Socioeconomic History  . Marital status: Married    Spouse name: Not on file  . Number of children: 1  . Years of education: Not on file  . Highest education level: Not on file  Occupational History  . Occupation: nurse-retired  Social Needs  . Financial resource strain: Not on file  . Food insecurity:    Worry: Not on file    Inability: Not on file  . Transportation needs:    Medical: Not on file    Non-medical: Not on file  Tobacco Use  . Smoking status: Former Smoker    Packs/day: 2.00    Years: 26.00    Pack years: 52.00    Types:  Cigarettes    Last attempt to quit: 06/22/1984    Years since quitting: 34.4  . Smokeless tobacco: Never Used  Substance and Sexual Activity  . Alcohol use: Not Currently    Alcohol/week: 0.0 standard drinks    Frequency: Never    Comment: rare- less than once a month  . Drug use: No  . Sexual activity: Yes    Birth control/protection: Surgical    Comment: hysterectomy  Lifestyle  . Physical activity:    Days per week: Not on file    Minutes per session: Not on file  . Stress: Not on file  Relationships  . Social connections:    Talks on phone: Not on file    Gets together: Not on file    Attends religious service: Not on file    Active member of club or organization: Not on file    Attends meetings of clubs or organizations: Not on file    Relationship status: Not on file  . Intimate partner violence:    Fear of current or ex partner: Not on file    Emotionally abused: Not on file    Physically abused: Not on file    Forced sexual activity: Not on file  Other Topics Concern  . Not on file  Social History Narrative   Patient lives at home with her husband.  Ambulates with a walker.    Family History  Problem Relation Age of Onset  . Heart attack Father        MI age 48 and 54  . Heart attack Sister 5       cabg; deceased 76  . Diabetes Sister   . Hypertension Sister   . Heart attack Brother 66       cabg; deceased 49s  . Diabetes Brother   . Colon cancer Mother 6       deceased 36  . Diabetes Maternal Grandmother   . Cancer Maternal Grandfather        unknown type; deceased  . Hypertension Daughter   . Thyroid disease Daughter   . Breast cancer Neg Hx      Current Outpatient Medications:  .  acetaminophen (TYLENOL) 325 MG tablet, Take 2 tablets (650 mg total) by mouth every 4 (four) hours as needed for mild pain., Disp: , Rfl:  .  amLODipine (NORVASC) 2.5 MG tablet, Take 1 tablet (2.5 mg total) by mouth daily., Disp: 90 tablet, Rfl: 3 .  atorvastatin  (LIPITOR) 80 MG tablet, Take 1 tablet (80 mg total) by mouth at bedtime., Disp: 30 tablet, Rfl:  0 .  ergocalciferol (VITAMIN D2) 1.25 MG (50000 UT) capsule, Take 50,000 Units by mouth every Monday., Disp: , Rfl:  .  ezetimibe (ZETIA) 10 MG tablet, TAKE 1 TABLET(10 MG) BY MOUTH DAILY, Disp: 30 tablet, Rfl: 5 .  ferrous sulfate 325 (65 FE) MG tablet, Take 1 tablet (325 mg total) by mouth daily with breakfast., Disp: 30 tablet, Rfl: 3 .  HYDROcodone-acetaminophen (NORCO/VICODIN) 5-325 MG tablet, Take 1 tablet by mouth every 4 (four) hours as needed for moderate pain., Disp: 30 tablet, Rfl: 0 .  losartan (COZAAR) 100 MG tablet, Take 1 tablet (100 mg total) by mouth daily., Disp: 30 tablet, Rfl: 0 .  megestrol (MEGACE) 400 MG/10ML suspension, Take 10 mLs (400 mg total) by mouth 2 (two) times daily., Disp: 240 mL, Rfl: 0 .  metoprolol tartrate (LOPRESSOR) 25 MG tablet, Take 1 tablet (25 mg total) by mouth 2 (two) times daily., Disp: 60 tablet, Rfl: 0 .  Multiple Vitamin (MULTIVITAMIN WITH MINERALS) TABS tablet, Take 1 tablet by mouth daily., Disp: 30 tablet, Rfl: 1 .  Multiple Vitamins-Minerals (OCUVITE PO), Take 1 capsule by mouth daily., Disp: , Rfl:  .  nitroGLYCERIN (NITROSTAT) 0.4 MG SL tablet, Place 1 tablet (0.4 mg total) under the tongue every 5 (five) minutes as needed for chest pain., Disp: 30 tablet, Rfl: 2 .  omeprazole (PRILOSEC) 20 MG capsule, Take 1 capsule (20 mg total) by mouth daily., Disp: 30 capsule, Rfl: 0 .  ondansetron (ZOFRAN) 4 MG tablet, Take 1 tablet (4 mg total) by mouth every 8 (eight) hours as needed for nausea or vomiting., Disp: 20 tablet, Rfl: 0 .  potassium chloride (K-DUR) 10 MEQ tablet, Take 10 mEq by mouth daily., Disp: , Rfl:  .  senna-docusate (SENNA S) 8.6-50 MG tablet, Take 1 tablet by mouth daily. , Disp: , Rfl:  .  sertraline (ZOLOFT) 100 MG tablet, Take 1 tablet (100 mg total) by mouth daily., Disp: 30 tablet, Rfl: 0 .  cyclobenzaprine (FLEXERIL) 10 MG tablet,  Take 1 tablet (10 mg total) by mouth 3 (three) times daily as needed for muscle spasms. (Patient not taking: Reported on 11/24/2018), Disp: 30 tablet, Rfl: 0  No results found.  No images are attached to the encounter.   CMP Latest Ref Rng & Units 11/07/2018  Glucose 70 - 99 mg/dL 139(H)  BUN 8 - 23 mg/dL 13  Creatinine 0.44 - 1.00 mg/dL 0.82  Sodium 135 - 145 mmol/L 145  Potassium 3.5 - 5.1 mmol/L 3.0(L)  Chloride 98 - 111 mmol/L 119(H)  CO2 22 - 32 mmol/L 17(L)  Calcium 8.9 - 10.3 mg/dL 8.7(L)  Total Protein 6.5 - 8.1 g/dL -  Total Bilirubin 0.3 - 1.2 mg/dL -  Alkaline Phos 38 - 126 U/L -  AST 15 - 41 U/L -  ALT 0 - 44 U/L -   CBC Latest Ref Rng & Units 11/06/2018  WBC 4.0 - 10.5 K/uL 7.1  Hemoglobin 12.0 - 15.0 g/dL 9.6(L)  Hematocrit 36.0 - 46.0 % 29.7(L)  Platelets 150 - 400 K/uL 180     Observation/objective: Patient appears frail and cachectic and is bedbound.  Appears in no acute distress  Assessment and plan: Patient is a 78 year old female with a history of stage II high-grade serous ovarian carcinoma and multiple other comorbidities including hypertension, hyperlipidemia, diabetes, coronary artery disease, gastric bypass surgery and recent bilateral subarachnoid hemorrhage with progressive decline in performance status  Patient has not had any CT scan after completing 4  cycles of chemotherapy to assess the status of her disease.  However given her multiple other comorbidities as well as recent subarachnoid hemorrhage and considerable decline in her performance status since then-she would be a reasonable candidate for hospice.  Her prognosis is likely less than 6 months taking into consideration her clinical picture and her other comorbidities.  Patient is bedbound with over 30 pound weight loss in 2 months, 2 episodes of recent hospitalization and little to no appetite.  Patient's family is interested in pursuing hospice services with Community hospice services  Follow-up  instructions:no follow up needed at this time  I discussed the assessment and treatment plan with the patient. The patient was provided an opportunity to ask questions and all were answered. The patient agreed with the plan and demonstrated an understanding of the instructions.   The patient was advised to call back or seek an in-person evaluation if the symptoms worsen or if the condition fails to improve as anticipated.  I provided 30 minutes of face-to-face video visit time during this encounter, and > 50% was spent counseling as documented under my assessment & plan.  Visit Diagnosis: 1. Goals of care, counseling/discussion   2. Primary high grade serous adenocarcinoma of ovary (HCC)   3. Subarachnoid hemorrhage ([place])     Dr. Randa Evens, MD, MPH [facility] at [facility] [phone] 11/25/2018 8:17 AM

## 2018-11-25 NOTE — Telephone Encounter (Signed)
We did video visit 6/4 with daughter and patient and I sent the community hospice referral today

## 2018-12-12 ENCOUNTER — Telehealth: Payer: Self-pay | Admitting: *Deleted

## 2018-12-12 NOTE — Telephone Encounter (Signed)
Pt came in today and wanted the front desk to ask if dr Janese Banks could fill out form to get asst with chemotherapy bill. I told staff to send it to me. I filled it out and got rao to sign.it was a cancer grant application and had to be turned in and patient had to be deceased but nor more than 12 months ago. The member has to be a [employer] member. I called husband after it was filled out and he wants to speak to Dr. Janese Banks. She called and spoke to him. He told me to put it in the mail to him and I put in the mail today

## 2018-12-20 ENCOUNTER — Ambulatory Visit: Payer: PPO | Admitting: Physical Medicine & Rehabilitation

## 2018-12-21 DEATH — deceased

## 2019-01-19 ENCOUNTER — Encounter: Payer: PPO | Admitting: Licensed Clinical Social Worker

## 2019-01-19 ENCOUNTER — Other Ambulatory Visit: Payer: PPO

## 2020-01-29 IMAGING — CR DG CHEST 2V
1 series · 2 of 2 positions shown · non-contrast
Comparison: 09/12/2017

CLINICAL DATA: Chest pain and shortness of breath.  Cough.

EXAM:
CHEST - 2 VIEW

[Series 1: dg chest 2 view · 0.14mm/px · 2 of 2 slices shown]
[im 1/2]
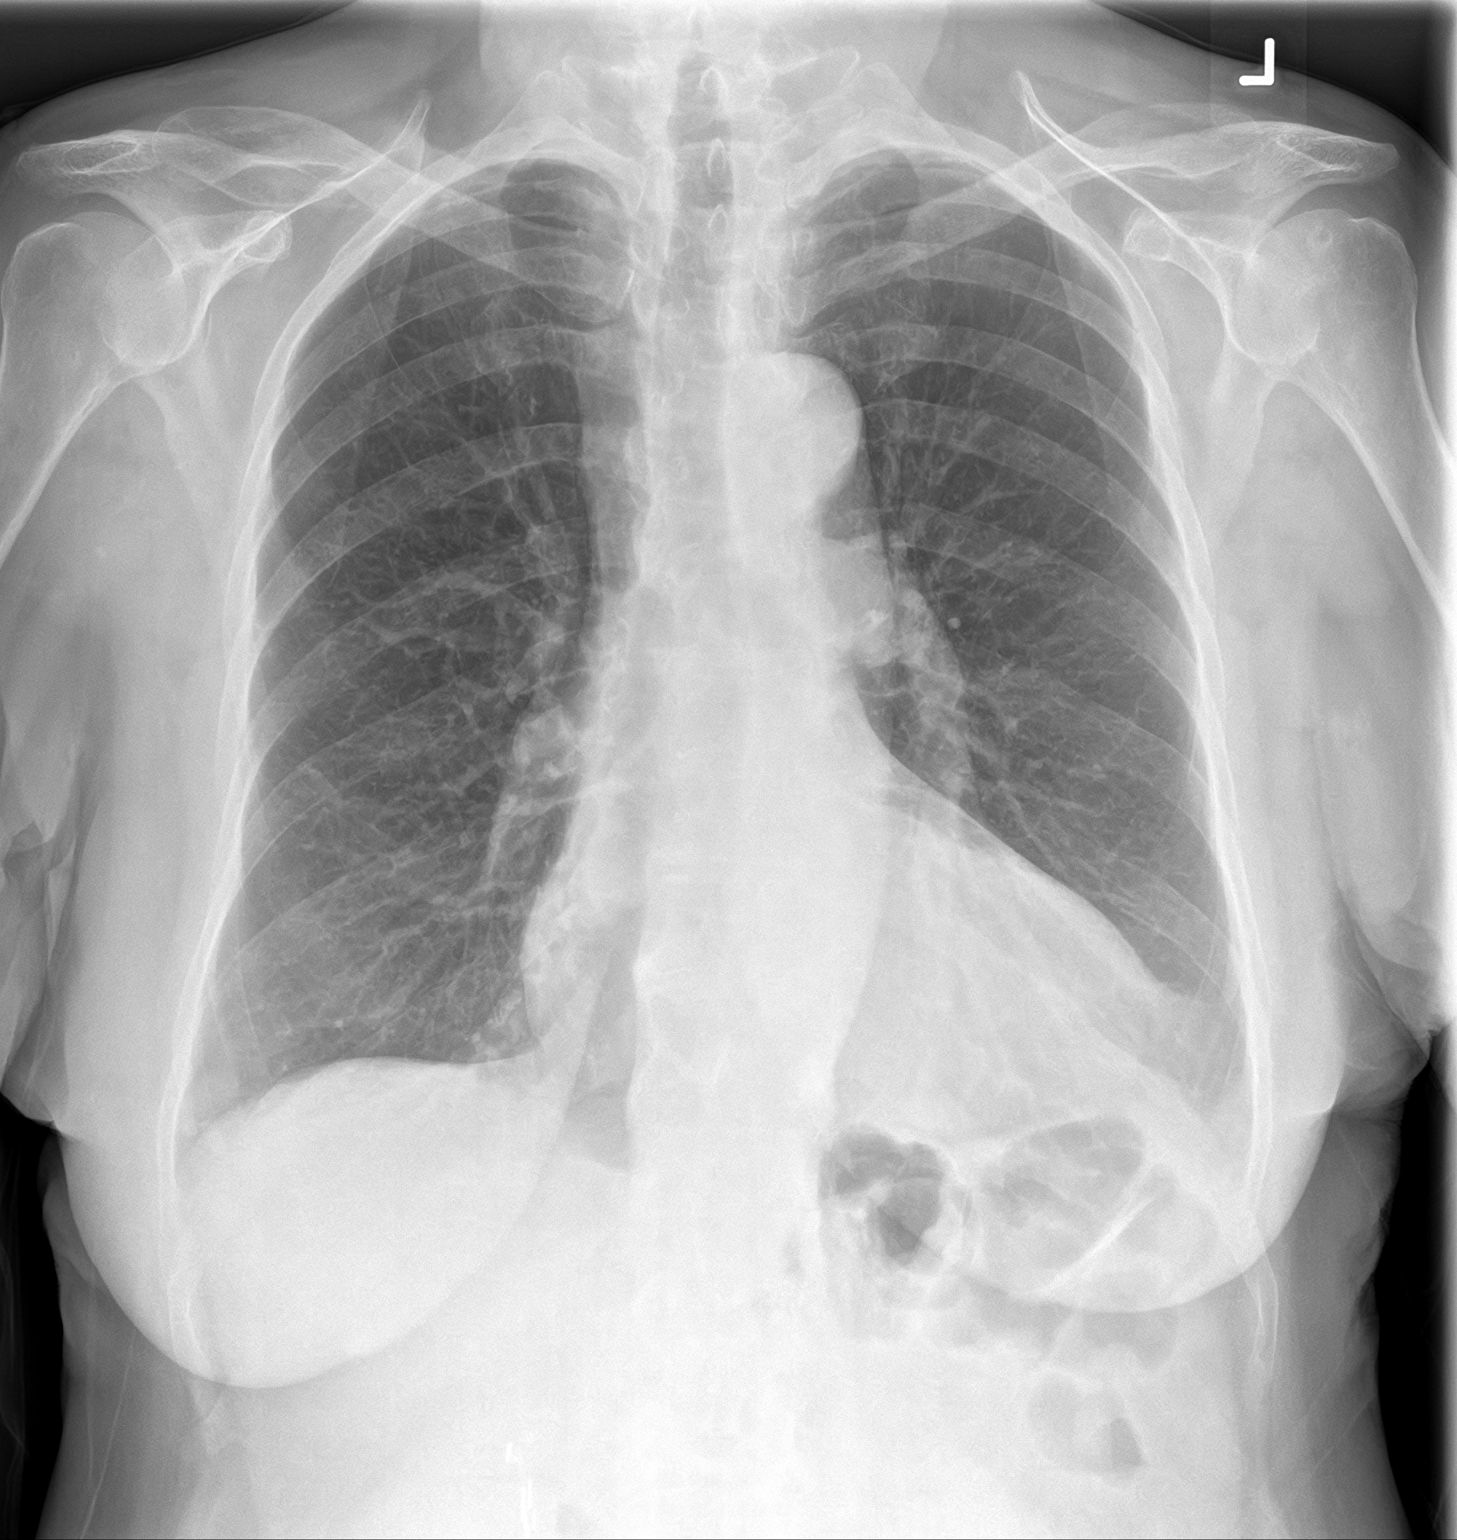
[im 2/2]
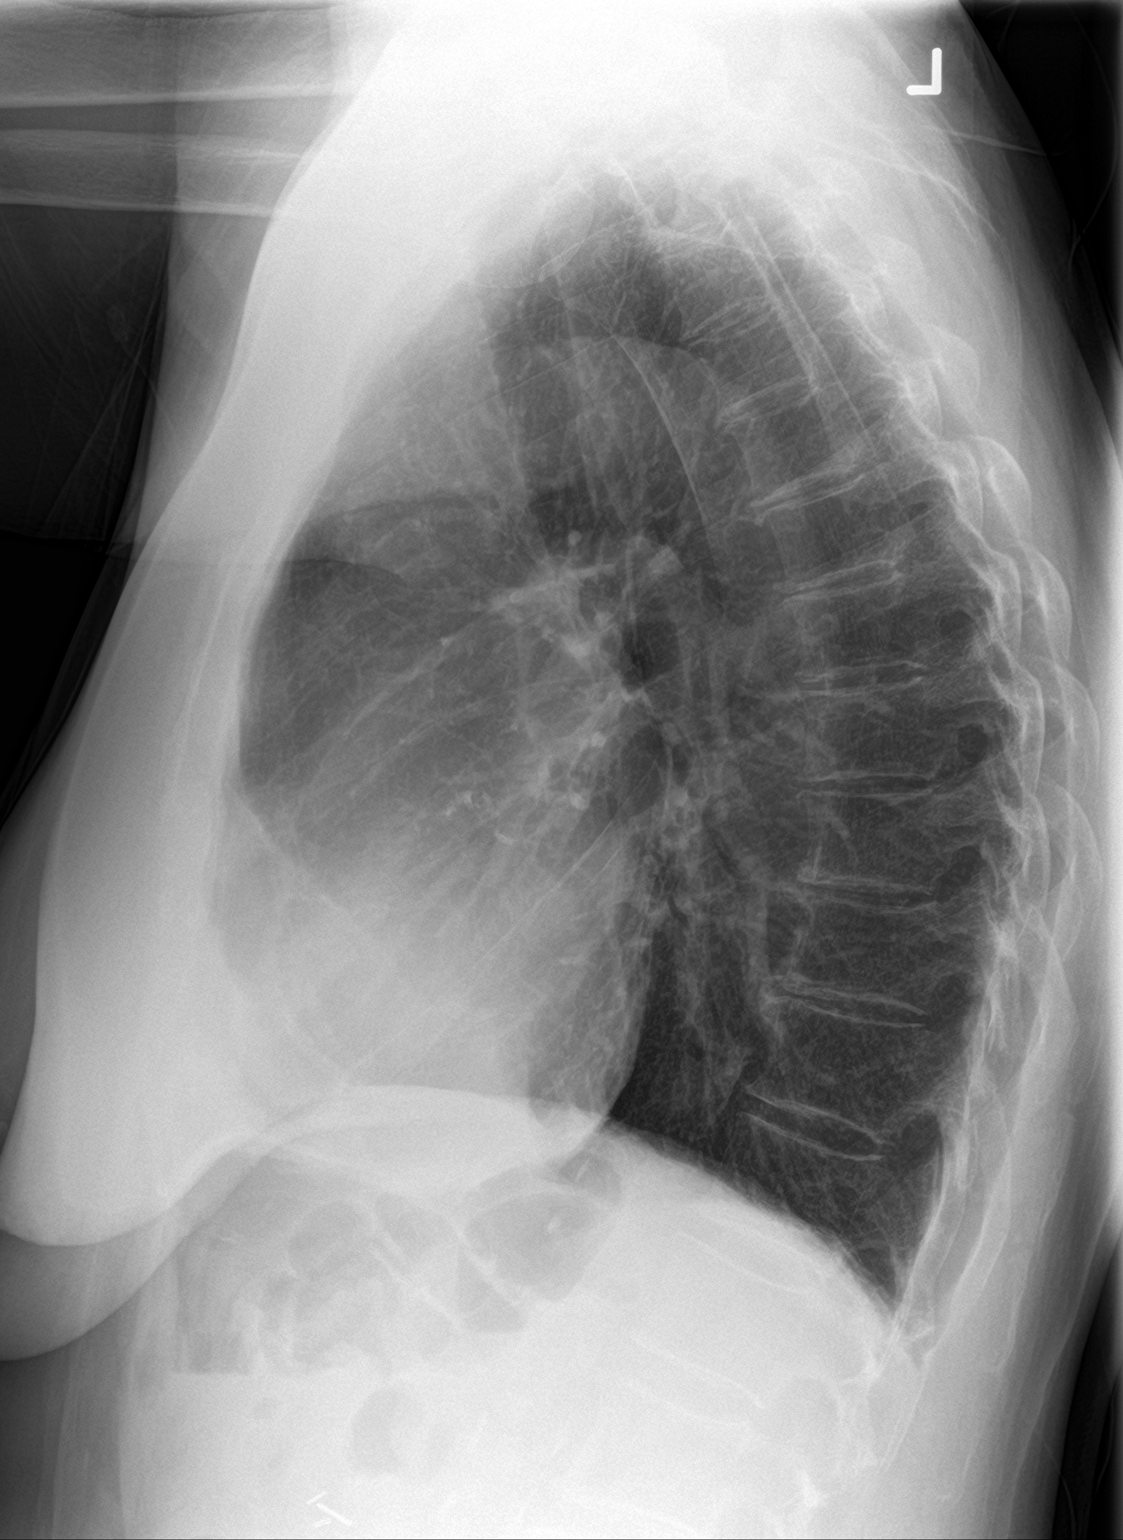

[2 of 2 positions shown; findings below may reference images not displayed]

FINDINGS: Chronic cardiomegaly. Pulmonary vascularity is normal. Lungs are
clear. No effusions. No significant bone abnormality.
IMPRESSION: No acute abnormality.  Chronic cardiomegaly.

## 2020-10-24 IMAGING — CT CT HEAD WITHOUT CONTRAST
4 series · 17 of 47 positions shown, 19 images · non-contrast
Comparison: 10/07/2018 CT head.

CLINICAL DATA: 77 y/o  F; subarachnoid hemorrhage after fall.

EXAM:
CT HEAD WITHOUT CONTRAST
TECHNIQUE: Contiguous axial images were obtained from the base of the skull
through the vertex without intravenous contrast.

[Series 3: head without · axial · non-contrast · 0.45mm/px · z∈[+1132,+1267]mm · 7 of 37 slices shown, 9 images]
[im 5/37  brain]
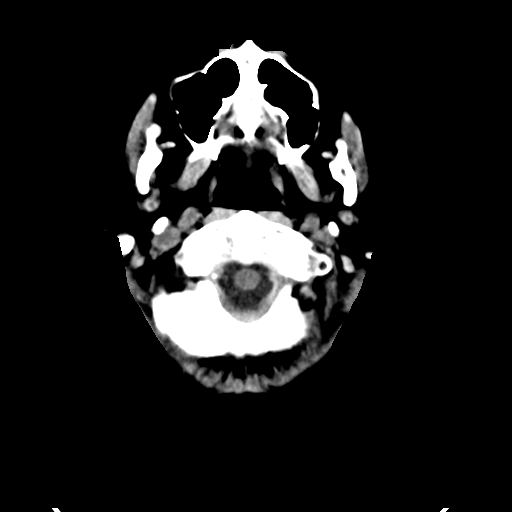
[im 5/37  bone]
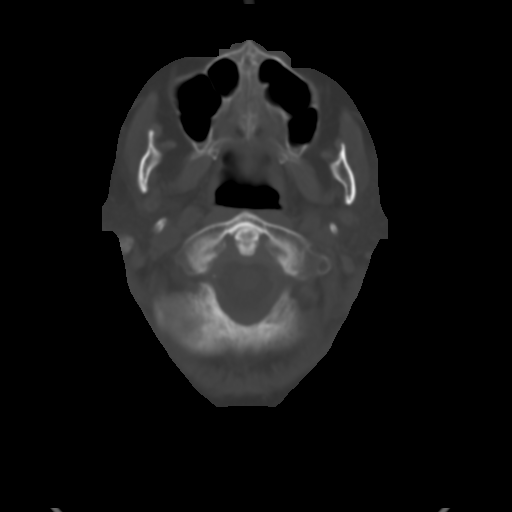
[im 10/37  brain]
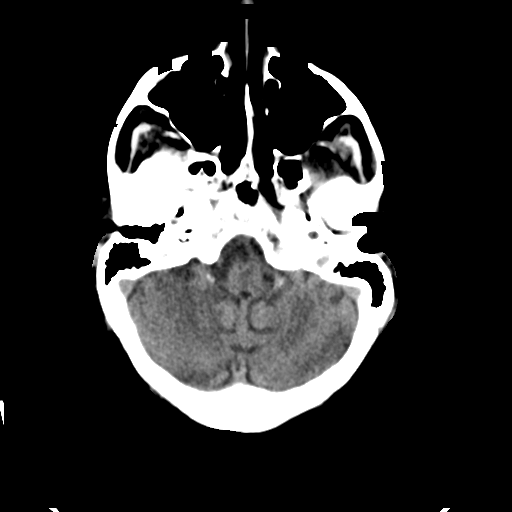
[im 14/37  brain]
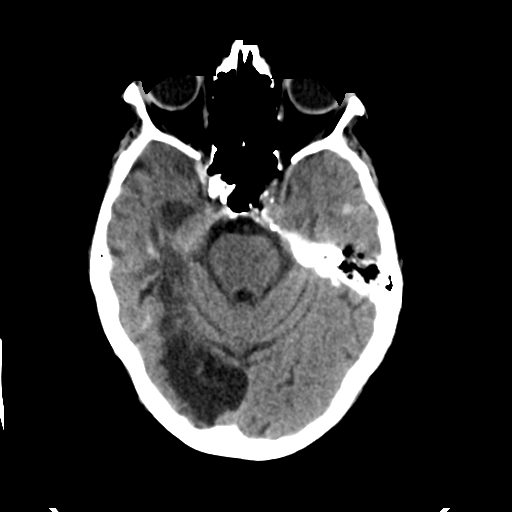
[im 19/37  brain]
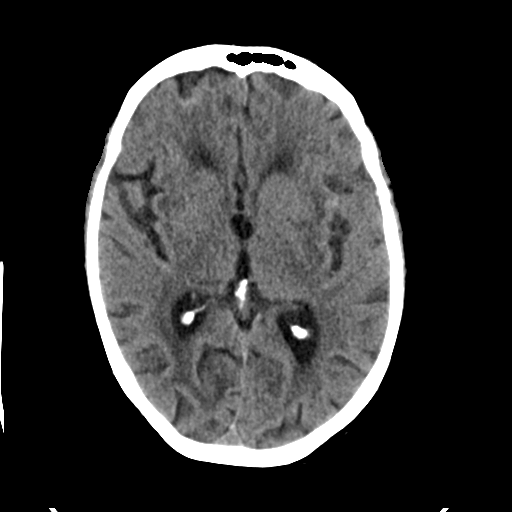
[im 23/37  brain]
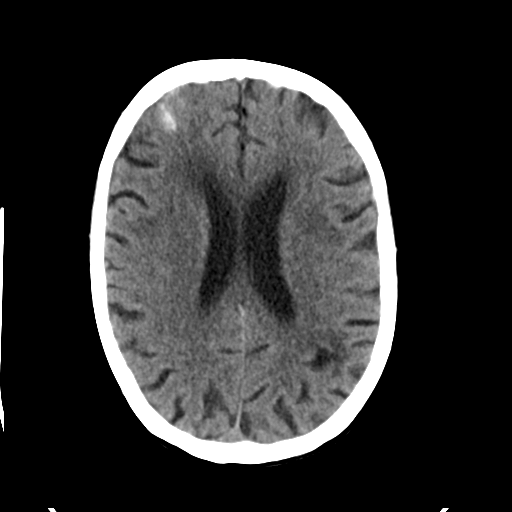
[im 23/37  bone]
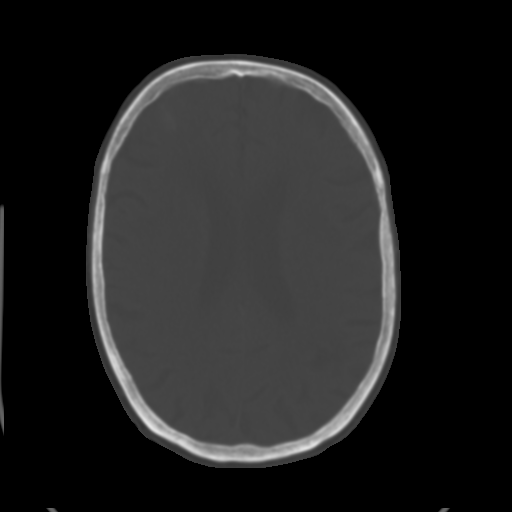
[im 28/37  brain]
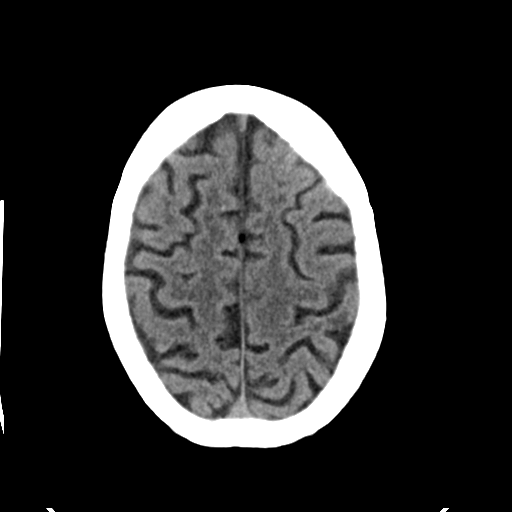
[im 32/37  brain]
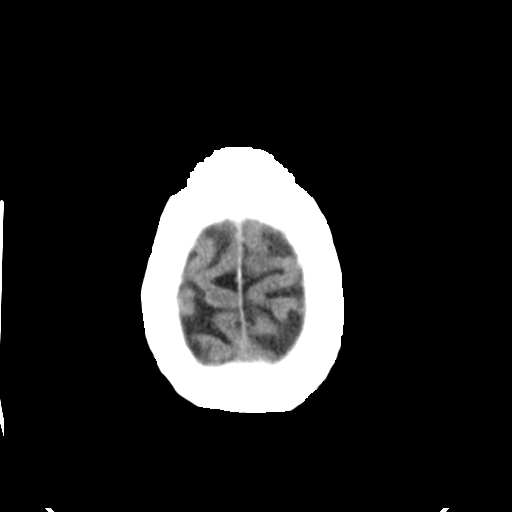

[Series 4: head bone · axial · 0.45mm/px · z∈[+1128,+1192]mm · 4 of 90 slices shown]
[im 9/90  bone]
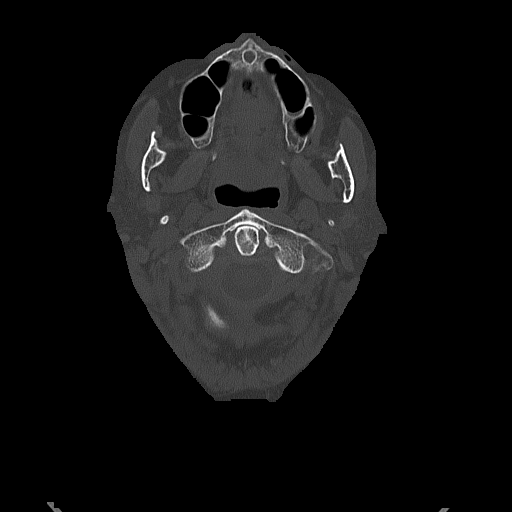
[im 18/90  bone]
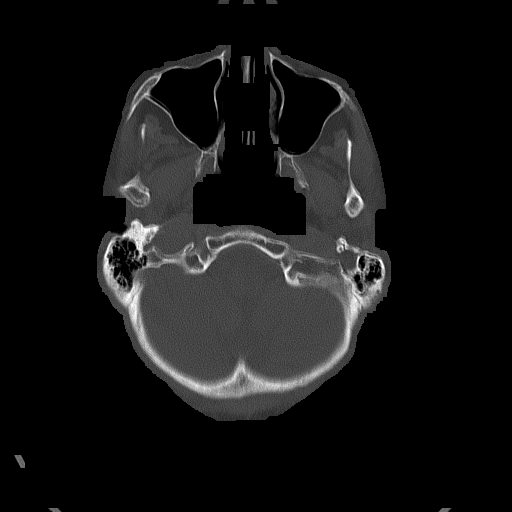
[im 27/90  bone]
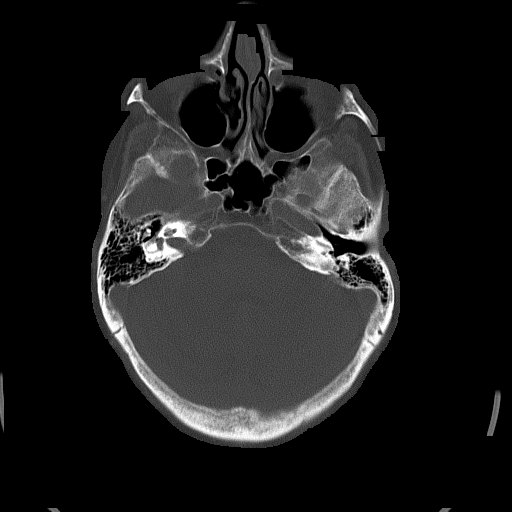
[im 41/90  bone]
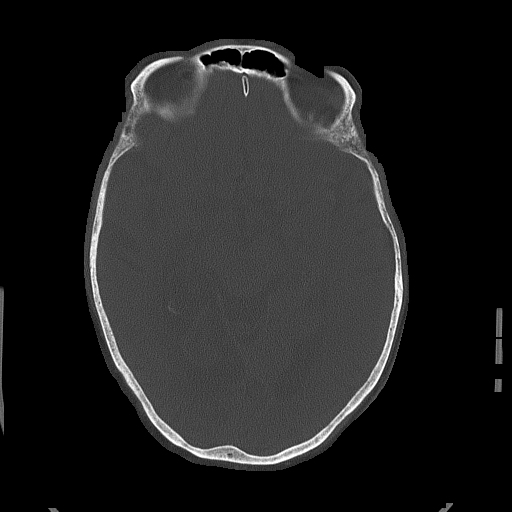

[Series 5: head without cor · coronal · non-contrast · 0.34mm/px · 3 of 69 slices shown]
[im 23/69  brain]
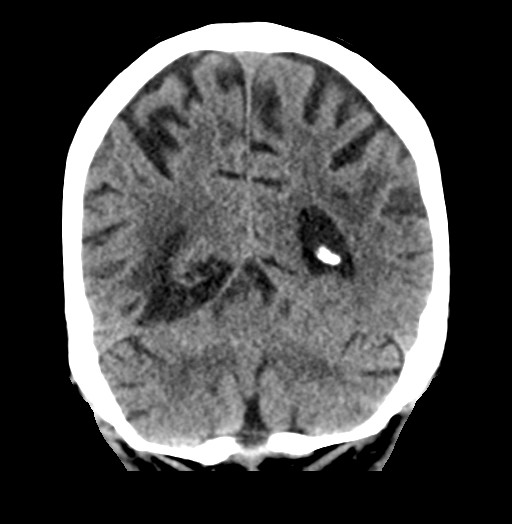
[im 31/69  brain]
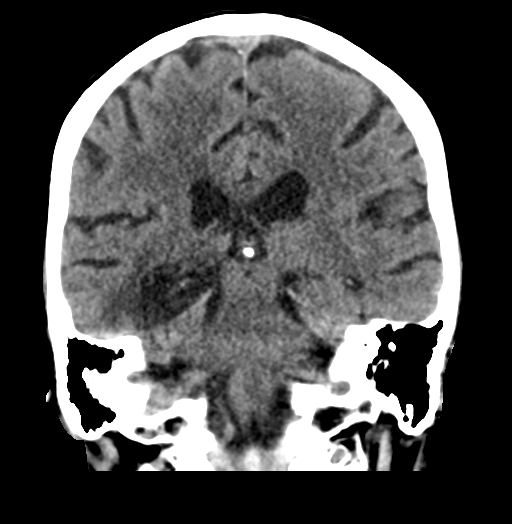
[im 38/69  brain]
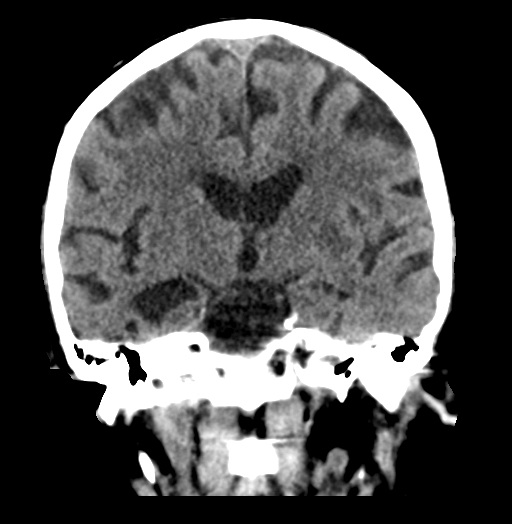

[Series 6: head without sag · sagittal · non-contrast · 0.35mm/px · 3 of 61 slices shown]
[im 21/61  brain]
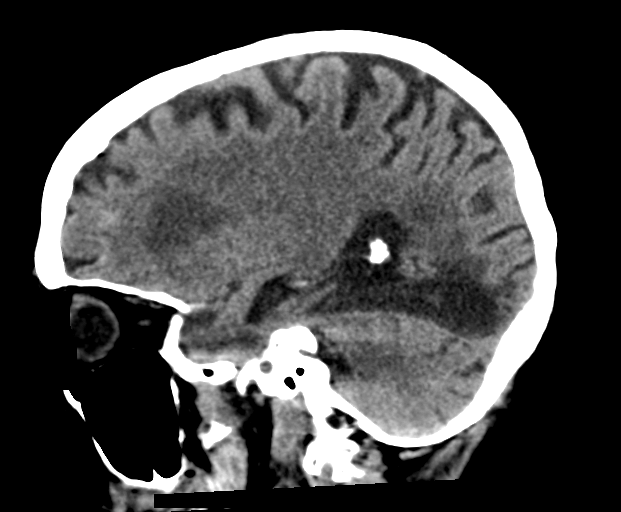
[im 31/61  brain]
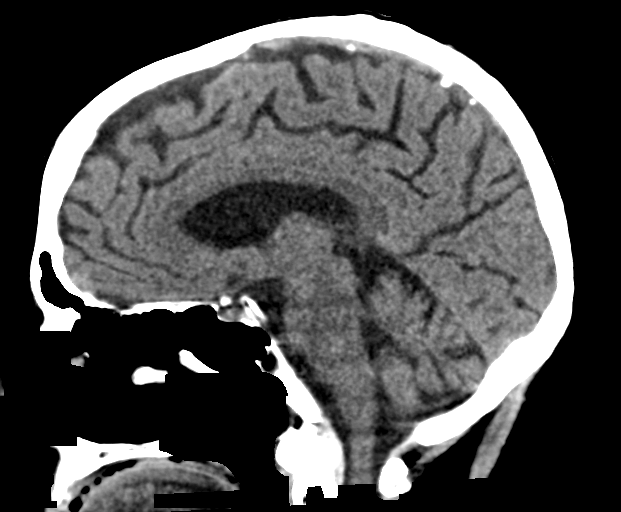
[im 41/61  brain]
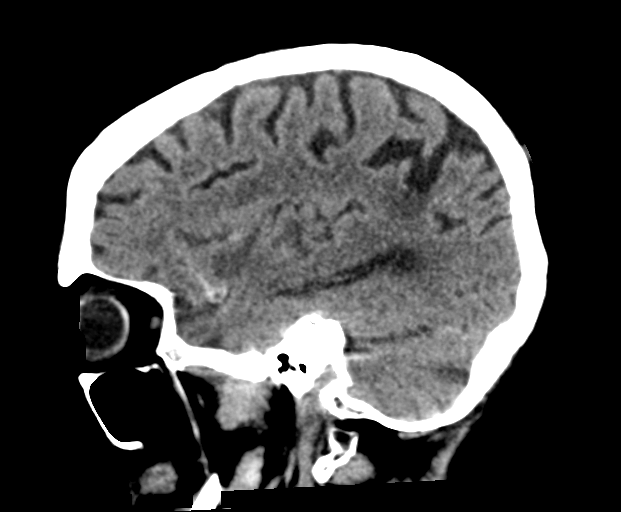

[17 of 47 positions shown; findings below may reference images not displayed]

FINDINGS: Brain: Stable small volume subarachnoid hemorrhage in left sylvian
fissure, over left temporal lobe, and over right frontal lobe. No
new intracranial hemorrhage identified. Stable chronic right PCA
distribution infarction. Stable small chronic infarction within the
left corona radiata. Stable chronic microvascular ischemic changes
and volume loss of the brain. No hydrocephalus or herniation.

Vascular: Calcific atherosclerosis of carotid siphons. No hyperdense
vessel identified.

Skull: Normal. Negative for fracture or focal lesion.

Sinuses/Orbits: No acute finding.

Other: Bilateral intra-ocular lens replacement.
IMPRESSION: 1. Stable small volume subarachnoid hemorrhage. No new intracranial
hemorrhage identified.
2. Stable chronic microvascular ischemic changes, volume loss of the
brain, and chronic infarctions.

## 2023-06-24 NOTE — Telephone Encounter (Signed)
 Error
